# Patient Record
Sex: Female | Born: 1935 | Race: Black or African American | Hispanic: No | Smoking: Former smoker
Health system: Southern US, Community
[De-identification: ages and names within clinical notes are randomized; demographics above are authoritative.]

## PROBLEM LIST (undated history)

## (undated) DIAGNOSIS — E049 Nontoxic goiter, unspecified: Secondary | ICD-10-CM

## (undated) DIAGNOSIS — Z8719 Personal history of other diseases of the digestive system: Secondary | ICD-10-CM

## (undated) DIAGNOSIS — R262 Difficulty in walking, not elsewhere classified: Secondary | ICD-10-CM

## (undated) DIAGNOSIS — E559 Vitamin D deficiency, unspecified: Secondary | ICD-10-CM

## (undated) DIAGNOSIS — N182 Chronic kidney disease, stage 2 (mild): Secondary | ICD-10-CM

## (undated) DIAGNOSIS — R4182 Altered mental status, unspecified: Secondary | ICD-10-CM

## (undated) DIAGNOSIS — Z853 Personal history of malignant neoplasm of breast: Secondary | ICD-10-CM

## (undated) DIAGNOSIS — F39 Unspecified mood [affective] disorder: Secondary | ICD-10-CM

## (undated) DIAGNOSIS — F0390 Unspecified dementia without behavioral disturbance: Secondary | ICD-10-CM

## (undated) DIAGNOSIS — R441 Visual hallucinations: Secondary | ICD-10-CM

## (undated) DIAGNOSIS — E041 Nontoxic single thyroid nodule: Secondary | ICD-10-CM

## (undated) DIAGNOSIS — I10 Essential (primary) hypertension: Secondary | ICD-10-CM

## (undated) DIAGNOSIS — M1990 Unspecified osteoarthritis, unspecified site: Secondary | ICD-10-CM

## (undated) DIAGNOSIS — K2270 Barrett's esophagus without dysplasia: Secondary | ICD-10-CM

## (undated) DIAGNOSIS — K219 Gastro-esophageal reflux disease without esophagitis: Secondary | ICD-10-CM

## (undated) DIAGNOSIS — K5790 Diverticulosis of intestine, part unspecified, without perforation or abscess without bleeding: Secondary | ICD-10-CM

## (undated) DIAGNOSIS — I272 Pulmonary hypertension, unspecified: Secondary | ICD-10-CM

## (undated) DIAGNOSIS — E1122 Type 2 diabetes mellitus with diabetic chronic kidney disease: Secondary | ICD-10-CM

## (undated) DIAGNOSIS — R413 Other amnesia: Secondary | ICD-10-CM

## (undated) DIAGNOSIS — C50919 Malignant neoplasm of unspecified site of unspecified female breast: Secondary | ICD-10-CM

## (undated) DIAGNOSIS — E669 Obesity, unspecified: Secondary | ICD-10-CM

## (undated) DIAGNOSIS — IMO0001 Reserved for inherently not codable concepts without codable children: Secondary | ICD-10-CM

## (undated) DIAGNOSIS — H409 Unspecified glaucoma: Secondary | ICD-10-CM

## (undated) DIAGNOSIS — N6019 Diffuse cystic mastopathy of unspecified breast: Secondary | ICD-10-CM

## (undated) DIAGNOSIS — N312 Flaccid neuropathic bladder, not elsewhere classified: Secondary | ICD-10-CM

## (undated) DIAGNOSIS — D0511 Intraductal carcinoma in situ of right breast: Secondary | ICD-10-CM

## (undated) DIAGNOSIS — E7800 Pure hypercholesterolemia, unspecified: Secondary | ICD-10-CM

## (undated) DIAGNOSIS — R32 Unspecified urinary incontinence: Secondary | ICD-10-CM

## (undated) HISTORY — DX: Difficulty in walking, not elsewhere classified: R26.2

## (undated) HISTORY — PX: EYE SURGERY: SHX253

## (undated) HISTORY — DX: Diffuse cystic mastopathy of unspecified breast: N60.19

## (undated) HISTORY — DX: Unspecified glaucoma: H40.9

## (undated) HISTORY — DX: Pure hypercholesterolemia, unspecified: E78.00

## (undated) HISTORY — PX: ADENOIDECTOMY: SUR15

## (undated) HISTORY — DX: Malignant neoplasm of unspecified site of unspecified female breast: C50.919

## (undated) HISTORY — DX: Barrett's esophagus without dysplasia: K22.70

## (undated) HISTORY — DX: Unspecified osteoarthritis, unspecified site: M19.90

## (undated) HISTORY — DX: Intraductal carcinoma in situ of right breast: D05.11

## (undated) HISTORY — DX: Nontoxic goiter, unspecified: E04.9

## (undated) HISTORY — DX: Vitamin D deficiency, unspecified: E55.9

## (undated) HISTORY — DX: Type 2 diabetes mellitus with diabetic chronic kidney disease: E11.22

## (undated) HISTORY — DX: Unspecified dementia, unspecified severity, without behavioral disturbance, psychotic disturbance, mood disturbance, and anxiety: F03.90

## (undated) HISTORY — DX: Altered mental status, unspecified: R41.82

## (undated) HISTORY — DX: Obesity, unspecified: E66.9

## (undated) HISTORY — DX: Other amnesia: R41.3

## (undated) HISTORY — DX: Personal history of malignant neoplasm of breast: Z85.3

## (undated) HISTORY — PX: VAGINAL HYSTERECTOMY: SUR661

## (undated) HISTORY — DX: Visual hallucinations: R44.1

## (undated) HISTORY — DX: Diverticulosis of intestine, part unspecified, without perforation or abscess without bleeding: K57.90

## (undated) HISTORY — DX: Chronic kidney disease, stage 2 (mild): N18.2

## (undated) HISTORY — DX: Pulmonary hypertension, unspecified: I27.20

## (undated) HISTORY — PX: BREAST EXCISIONAL BIOPSY: SUR124

## (undated) HISTORY — DX: Unspecified urinary incontinence: R32

## (undated) HISTORY — DX: Unspecified mood (affective) disorder: F39

## (undated) HISTORY — PX: BREAST LUMPECTOMY: SHX2

---
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
## 1972-03-10 HISTORY — PX: BREAST BIOPSY: SHX20

## 1975-03-11 HISTORY — PX: BREAST BIOPSY: SHX20

## 1997-11-22 ENCOUNTER — Other Ambulatory Visit: Admission: RE | Admit: 1997-11-22 | Discharge: 1997-11-22 | Payer: Self-pay | Admitting: *Deleted

## 1998-03-10 DIAGNOSIS — IMO0001 Reserved for inherently not codable concepts without codable children: Secondary | ICD-10-CM

## 1998-03-10 HISTORY — DX: Reserved for inherently not codable concepts without codable children: IMO0001

## 1998-11-13 ENCOUNTER — Other Ambulatory Visit: Admission: RE | Admit: 1998-11-13 | Discharge: 1998-11-13 | Payer: Self-pay | Admitting: Obstetrics and Gynecology

## 1999-07-24 ENCOUNTER — Ambulatory Visit (HOSPITAL_COMMUNITY): Admission: RE | Admit: 1999-07-24 | Discharge: 1999-07-24 | Payer: Self-pay | Admitting: Gastroenterology

## 1999-09-19 ENCOUNTER — Encounter: Payer: Self-pay | Admitting: Cardiology

## 1999-09-19 ENCOUNTER — Encounter: Admission: RE | Admit: 1999-09-19 | Discharge: 1999-09-19 | Payer: Self-pay | Admitting: Cardiology

## 1999-11-18 ENCOUNTER — Other Ambulatory Visit: Admission: RE | Admit: 1999-11-18 | Discharge: 1999-11-18 | Payer: Self-pay | Admitting: Obstetrics and Gynecology

## 2000-04-03 ENCOUNTER — Encounter: Admission: RE | Admit: 2000-04-03 | Discharge: 2000-04-03 | Payer: Self-pay | Admitting: Cardiology

## 2000-04-03 ENCOUNTER — Encounter: Payer: Self-pay | Admitting: Cardiology

## 2000-11-19 ENCOUNTER — Other Ambulatory Visit: Admission: RE | Admit: 2000-11-19 | Discharge: 2000-11-19 | Payer: Self-pay | Admitting: Obstetrics and Gynecology

## 2002-02-16 ENCOUNTER — Other Ambulatory Visit: Admission: RE | Admit: 2002-02-16 | Discharge: 2002-02-16 | Payer: Self-pay | Admitting: Family Medicine

## 2003-03-20 ENCOUNTER — Other Ambulatory Visit: Admission: RE | Admit: 2003-03-20 | Discharge: 2003-03-20 | Payer: Self-pay | Admitting: Family Medicine

## 2003-12-05 ENCOUNTER — Ambulatory Visit (HOSPITAL_COMMUNITY): Admission: RE | Admit: 2003-12-05 | Discharge: 2003-12-05 | Payer: Self-pay | Admitting: Gastroenterology

## 2003-12-05 ENCOUNTER — Encounter (INDEPENDENT_AMBULATORY_CARE_PROVIDER_SITE_OTHER): Payer: Self-pay | Admitting: Specialist

## 2005-03-10 DIAGNOSIS — E041 Nontoxic single thyroid nodule: Secondary | ICD-10-CM

## 2005-03-10 HISTORY — DX: Nontoxic single thyroid nodule: E04.1

## 2005-04-10 ENCOUNTER — Encounter: Admission: RE | Admit: 2005-04-10 | Discharge: 2005-04-10 | Payer: Self-pay | Admitting: Family Medicine

## 2005-05-06 ENCOUNTER — Ambulatory Visit (HOSPITAL_COMMUNITY): Admission: RE | Admit: 2005-05-06 | Discharge: 2005-05-06 | Payer: Self-pay | Admitting: Family Medicine

## 2005-06-09 ENCOUNTER — Encounter (HOSPITAL_COMMUNITY): Admission: RE | Admit: 2005-06-09 | Discharge: 2005-09-07 | Payer: Self-pay | Admitting: Family Medicine

## 2005-06-17 ENCOUNTER — Encounter (INDEPENDENT_AMBULATORY_CARE_PROVIDER_SITE_OTHER): Payer: Self-pay | Admitting: Specialist

## 2005-06-17 ENCOUNTER — Encounter: Admission: RE | Admit: 2005-06-17 | Discharge: 2005-06-17 | Payer: Self-pay | Admitting: Family Medicine

## 2005-06-17 ENCOUNTER — Other Ambulatory Visit: Admission: RE | Admit: 2005-06-17 | Discharge: 2005-06-17 | Payer: Self-pay | Admitting: Diagnostic Radiology

## 2006-05-28 ENCOUNTER — Encounter: Admission: RE | Admit: 2006-05-28 | Discharge: 2006-05-28 | Payer: Self-pay | Admitting: Family Medicine

## 2006-06-10 ENCOUNTER — Encounter (INDEPENDENT_AMBULATORY_CARE_PROVIDER_SITE_OTHER): Payer: Self-pay | Admitting: *Deleted

## 2006-06-10 ENCOUNTER — Encounter: Admission: RE | Admit: 2006-06-10 | Discharge: 2006-06-10 | Payer: Self-pay | Admitting: Family Medicine

## 2006-06-10 ENCOUNTER — Other Ambulatory Visit: Admission: RE | Admit: 2006-06-10 | Discharge: 2006-06-10 | Payer: Self-pay | Admitting: Diagnostic Radiology

## 2007-03-11 HISTORY — PX: COLONOSCOPY: SHX174

## 2007-06-14 ENCOUNTER — Encounter: Admission: RE | Admit: 2007-06-14 | Discharge: 2007-06-14 | Payer: Self-pay | Admitting: Family Medicine

## 2010-03-31 ENCOUNTER — Encounter: Payer: Self-pay | Admitting: Family Medicine

## 2010-04-29 ENCOUNTER — Other Ambulatory Visit: Payer: Self-pay | Admitting: Family Medicine

## 2010-04-29 DIAGNOSIS — Z1231 Encounter for screening mammogram for malignant neoplasm of breast: Secondary | ICD-10-CM

## 2010-05-08 ENCOUNTER — Ambulatory Visit
Admission: RE | Admit: 2010-05-08 | Discharge: 2010-05-08 | Disposition: A | Discharge status: Home / Self Care | Payer: Medicare Other | Source: Ambulatory Visit | Attending: Family Medicine | Admitting: Family Medicine

## 2010-05-08 DIAGNOSIS — Z1231 Encounter for screening mammogram for malignant neoplasm of breast: Secondary | ICD-10-CM

## 2010-05-14 ENCOUNTER — Other Ambulatory Visit: Payer: Self-pay | Admitting: Family Medicine

## 2010-05-14 DIAGNOSIS — R928 Other abnormal and inconclusive findings on diagnostic imaging of breast: Secondary | ICD-10-CM

## 2010-05-20 ENCOUNTER — Ambulatory Visit
Admission: RE | Admit: 2010-05-20 | Discharge: 2010-05-20 | Disposition: A | Discharge status: Home / Self Care | Payer: Medicare (Managed Care) | Source: Ambulatory Visit | Attending: Family Medicine | Admitting: Family Medicine

## 2010-05-20 DIAGNOSIS — R928 Other abnormal and inconclusive findings on diagnostic imaging of breast: Secondary | ICD-10-CM

## 2010-07-26 NOTE — Op Note (Signed)
[facility]  Patient:    [name], [name]                          MRN: [id number] Proc. Date: 07/24/99 Adm. Date:  [id number] Disc. Date: [id number] Attending:  Rich Brave CC:         Lenoard Aden, M.D.                           Operative Report  PROCEDURE:  Colonoscopy with biopsy.  INDICATION:  A 77 year old for follow up of adenomatous polyps, removed about 2-1/2 years ago.  FINDINGS:  Multiple small sessile polys removed from the proximal ascending colon and also in the rectosigmoid region.  Mild sigmoid diverticulosis.  DESCRIPTION OF PROCEDURE:  The nature, purpose and risks of the procedure were were familiar to the patient from previous examinations, and she provided written consent.  Sedation for this procedure, and the upper endoscopy which preceded it, totaled fentanyl 100 mcg and Versed 6 mg IV; without arrhythmias or desaturation.  The Olympus adult video colonoscope was advanced easily to the level just above the cecum, but then, to reach the base of the cecum, took about 15 min including having the patient in the prone position.  Ultimately, the tip of the scope reached the base of the cecum, as confirmed by clear visualization of the appendiceal orifice; pullback was then performed, although the pullback in the proximal ascending colon was uncontrolled in this an optimal look was not obtained of the entire cecal and proximal colonic area.  However, between the inspection during prolonged efforts at antegrade advancement plus the glimpse during pullout, it is felt that no significant lesions would have been missed.  The quality of the prep was excellent, so it is felt that otherwise all areas were well seen.  Just above the cecum were two or three small sessile polyps, the largest perhaps 4 mm across, and I elected to remove these by several cold biopsies.  In the rectosigmoid region, I encountered approximately 6-10  small sessile hyperplastic-appearing polyps, some of which I repeatedly biopsied to the point of extirpation, other of which I just sampled, and a couple of small flat ones I did not even bother to biopsy -- in view of the multiple numbers of these polyps and the presumed hyperplastic character, based on the appearance, location and number.  Retroflexion was not performed in the rectum.  There was a mild to moderate amount of sigmoid diverticulosis, with some slight changes of associated muscular thickening mycosis).  There was no evidence of colitis, vascular malformations, cancer or large polyps.  The patient tolerated this procedure well and there were no apparent complications.  IMPRESSION: 1. Multiple small colonic polyps, biopsied as described above. 2. Mild sigmoid diverticulosis.  PLAN:  Await pathology on the polyps. DD:  07/24/99 TD:  07/28/99 Job: [id number]

## 2010-07-26 NOTE — Op Note (Signed)
NAME:  [name], [name]                   ACCOUNT NO.:  [id number]   MEDICAL RECORD NO.:  [id number]          PATIENT TYPE:  AMB   LOCATION:  ENDO                         FACILITY:  [facility]   PHYSICIAN:  [name], M.D.   DATE OF BIRTH:  Oct 09, 1931   DATE OF PROCEDURE:  12/05/2003  DATE OF DISCHARGE:                                 OPERATIVE REPORT   PROCEDURE:  Upper endoscopy with biopsies.   SURGEON:   INDICATIONS FOR PROCEDURE:  A 77 year old female with prior history of short  segment Barrett's esophagus, positive for intestinal metaplasia but negative  for dysphagia when biopsied about two years ago.   FINDINGS:  A large hiatal hernia as previously noted with short segment  Barrett's esophagus.   DESCRIPTION OF PROCEDURE:  The nature, purpose and risks of the procedure  were familiar to the patient from prior examination.  She provided written  consent.  Sedation was Fentanyl 50 mcg and Versed 7 mg IV without  arrhythmias or desaturation.  The Olympus video endoscope was passed under  direct vision.  The vocal cords were not well seen.  The esophagus was quite  readily entered.  The squamocolumnar junction was at about 30 cm.  There was  roughly 1 cm tongue of Barrett's mucosa in the distal esophagus and this was  biopsied several times but the remainder of the esophagus was unremarkable,  without evidence of free reflux, reflux esophagitis, varices, infection or  neoplasia.  No ring or stricture was appreciated.  The patient had a roughly  10 cm hiatal hernia with diaphragmatic hiatus at about 40 cm.  The  diaphragmatic hiatus did not appear particularly patient, however.   The stomach was entered and noted to contain a fair amount of retained food.  The gastric mucosa were visualized, unremarkable in appearance and no polyps  or masses were observed.  Retroflexed viewing, as noted, did not show any  significant abnormalities other than a slightly patulous diaphragmatic  hiatus and the pylorus, duodenal bulb and second duodenum looked normal.   The scope was removed from the patient who tolerated the procedure well  without complications.   IMPRESSION:  1.  Short segment Barrett's esophagus (530.85).  2.  Retained food in the stomach, raising question of some possible delayed      gastric emptying.   PLAN:  1.  Await pathology of biopsies.  2.  Consider follow-up endoscopy in two to three years for continued      surveillance of Barrett's.  3.  Depending on severity of reflux symptoms, consideration might be given      to prokinetic therapy with either metoclopramide or tegaserod, although      I tend to doubt that that will be needed.       RB/MEDQ  D:  12/05/2003  T:  12/05/2003  Job:  [id number]   cc:   Gretta Arab. [name], M.D.  [address] [place]  [place] [address]  Fax: [phone]

## 2010-07-26 NOTE — Procedures (Signed)
[facility]  Patient:    [name], [name]                          MRN: [id number] Proc. Date: 07/24/99 Adm. Date:  [id number] Disc. Date: [id number] Attending:  Rich Brave CC:         Lenoard Aden, M.D.                           Procedure Report  PROCEDURE PERFORMED:  Upper endoscopy.  ENDOSCOPIST:  Florencia Reasons, M.D.  INDICATIONS FOR PROCEDURE:  Longstanding reflux in a 77 year old female.  FINDINGS:  Basically unchanged from three years ago when last examined, with fairly large hiatal hernia but no evidence of esophagitis or Barretts.  DESCRIPTION OF PROCEDURE:  The nature, purpose and risks of the procedure were familiar to the patient from prior examination and she provided written consent.  Sedation was fentanyl 75 mcg and Versed 4 mg IV without arrhythmias or desaturation.  The Olympus video endoscope was passed under direct vision. The vocal cords looked normal but there was a little bit of edema of the posterior commissure of the larynx suggesting possibly an element of laryngeal pharyngeal reflux.  The esophagus was entered without difficulty and had normal mucosa throughout its entire length, specifically without evidence of Barretts esophagus, reflux esophagitis, varices, infection or neoplasia. There was bilious reflux from the stomach and hiatal hernia up into the esophagus as the patient retched during the initial part of the procedure.  At the squamocolumnar junction there appeared to be a widely patent esophageal ring and below this was a fairly large hiatal hernia, probably about 5 cm in length.  The stomach was entered.  It contained no significant residual. There was a little bit of watermelon-type erythema in the antrum of the stomach but no focal lesion such as erosions, ulcers, polyps or masses. Retroflex viewing of the proximal stomach showed that the diaphragmatic hiatus was somewhat patulous but it was  otherwise a normal exam.  The pylorus, duodenal bulb and second duodenum were unremarkable in appearance apart from some slightly hypertrophied Brunners glands.  The scope was then removed from the patient.  No biopsies were obtained.   The patient tolerated the procedure well and there were no apparent complications.   IMPRESSION:  No evidence of any significant adverse sequelae of reflux. Possible mild laryngeal changes attributable to reflux.  Moderately large hiatal hernia.  PLAN:  Continue medical therapy with Prilosec 10 mg daily which is the dose she has been on for some time with good results. DD:  07/24/99 TD:  07/26/99 Job: [id number]

## 2010-12-12 ENCOUNTER — Other Ambulatory Visit: Payer: Self-pay | Admitting: Family Medicine

## 2010-12-12 DIAGNOSIS — R921 Mammographic calcification found on diagnostic imaging of breast: Secondary | ICD-10-CM

## 2010-12-25 ENCOUNTER — Ambulatory Visit
Admission: RE | Admit: 2010-12-25 | Discharge: 2010-12-25 | Disposition: A | Discharge status: Home / Self Care | Payer: Medicare Other | Source: Ambulatory Visit | Attending: Family Medicine | Admitting: Family Medicine

## 2010-12-25 ENCOUNTER — Other Ambulatory Visit: Payer: Self-pay | Admitting: Family Medicine

## 2010-12-25 DIAGNOSIS — R921 Mammographic calcification found on diagnostic imaging of breast: Secondary | ICD-10-CM

## 2011-01-01 ENCOUNTER — Ambulatory Visit
Admission: RE | Admit: 2011-01-01 | Discharge: 2011-01-01 | Disposition: A | Discharge status: Home / Self Care | Payer: Medicare Other | Source: Ambulatory Visit | Attending: Family Medicine | Admitting: Family Medicine

## 2011-01-01 ENCOUNTER — Other Ambulatory Visit: Payer: Self-pay | Admitting: Family Medicine

## 2011-01-01 DIAGNOSIS — R921 Mammographic calcification found on diagnostic imaging of breast: Secondary | ICD-10-CM

## 2011-01-01 DIAGNOSIS — C50919 Malignant neoplasm of unspecified site of unspecified female breast: Secondary | ICD-10-CM

## 2011-01-01 HISTORY — DX: Malignant neoplasm of unspecified site of unspecified female breast: C50.919

## 2011-01-02 ENCOUNTER — Ambulatory Visit
Admission: RE | Admit: 2011-01-02 | Discharge: 2011-01-02 | Disposition: A | Discharge status: Home / Self Care | Payer: Medicare Other | Source: Ambulatory Visit | Attending: Family Medicine | Admitting: Family Medicine

## 2011-01-02 ENCOUNTER — Other Ambulatory Visit: Payer: Self-pay | Admitting: Family Medicine

## 2011-01-02 DIAGNOSIS — C50912 Malignant neoplasm of unspecified site of left female breast: Secondary | ICD-10-CM

## 2011-01-02 DIAGNOSIS — R921 Mammographic calcification found on diagnostic imaging of breast: Secondary | ICD-10-CM

## 2011-01-07 ENCOUNTER — Encounter (INDEPENDENT_AMBULATORY_CARE_PROVIDER_SITE_OTHER): Payer: Self-pay | Admitting: Surgery

## 2011-01-07 ENCOUNTER — Ambulatory Visit
Admission: RE | Admit: 2011-01-07 | Discharge: 2011-01-07 | Disposition: A | Discharge status: Home / Self Care | Payer: Medicare Other | Source: Ambulatory Visit | Attending: Family Medicine | Admitting: Family Medicine

## 2011-01-07 ENCOUNTER — Encounter: Payer: Self-pay | Admitting: *Deleted

## 2011-01-07 DIAGNOSIS — C50912 Malignant neoplasm of unspecified site of left female breast: Secondary | ICD-10-CM

## 2011-01-07 MED ORDER — GADOBENATE DIMEGLUMINE 529 MG/ML IV SOLN
20.0000 mL | Freq: Once | INTRAVENOUS | Status: AC | PRN
  Administered 2011-01-07: 20 mL via INTRAVENOUS

## 2011-01-08 ENCOUNTER — Encounter (INDEPENDENT_AMBULATORY_CARE_PROVIDER_SITE_OTHER): Payer: Self-pay | Admitting: Surgery

## 2011-01-08 ENCOUNTER — Ambulatory Visit (HOSPITAL_BASED_OUTPATIENT_CLINIC_OR_DEPARTMENT_OTHER): Payer: Medicare Other | Admitting: Surgery

## 2011-01-08 ENCOUNTER — Other Ambulatory Visit: Payer: Self-pay | Admitting: Oncology

## 2011-01-08 ENCOUNTER — Encounter (HOSPITAL_BASED_OUTPATIENT_CLINIC_OR_DEPARTMENT_OTHER): Payer: PRIVATE HEALTH INSURANCE | Admitting: Oncology

## 2011-01-08 ENCOUNTER — Ambulatory Visit: Payer: Medicare Other | Admitting: Physical Therapy

## 2011-01-08 VITALS — BP 126/74 | HR 83 | Temp 97.7°F | Resp 20 | Ht 63.0 in | Wt 236.7 lb

## 2011-01-08 DIAGNOSIS — D0590 Unspecified type of carcinoma in situ of unspecified breast: Secondary | ICD-10-CM

## 2011-01-08 DIAGNOSIS — C50919 Malignant neoplasm of unspecified site of unspecified female breast: Secondary | ICD-10-CM

## 2011-01-08 LAB — CBC WITH DIFFERENTIAL/PLATELET
Basophils Absolute: 0 10*3/uL (ref 0.0–0.1)
Eosinophils Absolute: 0.2 10*3/uL (ref 0.0–0.5)
HGB: 13.3 g/dL (ref 11.6–15.9)
LYMPH%: 22.8 % (ref 14.0–49.7)
MCV: 90.2 fL (ref 79.5–101.0)
MONO#: 0.4 10*3/uL (ref 0.1–0.9)
MONO%: 8 % (ref 0.0–14.0)
NEUT#: 3.2 10*3/uL (ref 1.5–6.5)
Platelets: 227 10*3/uL (ref 145–400)
RBC: 4.5 10*6/uL (ref 3.70–5.45)
WBC: 4.9 10*3/uL (ref 3.9–10.3)

## 2011-01-08 LAB — COMPREHENSIVE METABOLIC PANEL
Albumin: 3.7 g/dL (ref 3.5–5.2)
BUN: 18 mg/dL (ref 6–23)
CO2: 26 mEq/L (ref 19–32)
Glucose, Bld: 167 mg/dL — ABNORMAL HIGH (ref 70–99)
Potassium: 3.7 mEq/L (ref 3.5–5.3)
Sodium: 140 mEq/L (ref 135–145)
Total Protein: 7 g/dL (ref 6.0–8.3)

## 2011-01-08 NOTE — Patient Instructions (Signed)
We will schedule you to have your left breast cancer removed. The day of surgery you'll need to go to the breast center for them to place a guidewire to localize the clip which they used to mark the breast cancer. He should be able to go home after the surgery.  If you have any questions about the surgery please call my office. My nurse is Jade.

## 2011-01-08 NOTE — Progress Notes (Signed)
NAME: [name]                                                                                      DOB: 06-12-1931 DATE: 01/08/2011               MRN: [id number]   CC:  Chief Complaint  Patient presents with  . Breast Cancer    HPI:  [name] is a 77 y.o.  female who presents with newly diagnosed left breast cancer. 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 She comes to the clinic for discussion on treatment alternatives. She has never had a significant breast problems although she did have a benign breast cyst removed many years ago. She has no family history of breast cancer.  PMH:   has a past medical history of Breast cancer, Left, DCIS (01/01/2011).  PSH:   has past surgical history that includes Tonsillectomy (1944); Breast biopsy (1974); Breast biopsy (1977); and Abdominal hysterectomy.  ALLERGIES:   Allergies  Allergen Reactions  . Tape Rash  . Penicillins Hives    MEDICATIONS:   Current Outpatient Prescriptions  Medication Sig Dispense Refill  . Multiple Vitamins-Minerals (MULTIVITAMIN WITH MINERALS) tablet Take by mouth daily.        [name] omeprazole (PRILOSEC) 10 MG capsule Take 10 mg by mouth daily.          ROS: Her 12 point review of systems form is negative except for the fact she gets a little short of breath walking up stairs and sleeps on more than one pillow and has had some heartburn arthritis and difficulty walking up steps. She's also been diagnosed with a benign thyroid nodule.  EXAM:   GENERAL:  The patient is alert, oriented, and generally healthy-appearing, NAD. Mood and affect are normal.  HEENT:  The head is normocephalic, the eyes nonicteric, the pupils were round regular and equal. EOMs are normal. Pharynx normal. Dentition good.  NECK:  The neck is supple and there are no masses or thyromegaly.  LUNGS: Normal respirations and clear to  auscultation.  HEART: Regular rhythm, with no murmurs rubs or gallops. Pulses are intact carotid dorsalis pedis and posterior tibial. No significant varicosities are noted.  BREASTS:  The breasts are symmetric in appearance. They're fairly large. There is no dominant mass.  LYMPHATICS: There are no abnormal lymph nodes in the axilla or supraclavicular areas.  ABDOMEN: Soft, flat, and nontender. No masses or organomegaly is noted. No hernias are noted. Bowel sounds are normal.  EXTREMITIES:  Good range of motion, no edema.   DATA REVIEWED:   I seen her mammogram and MRI films and reports her pathology slides and reports.  IMPRESSION:  Clinical stage II left breast cancer posterior lateral central left breast  PLAN:   I think a needle localized lumpectomy as the best option although she could have a mastectomy. She is very discussed the situation with the medical and radiation oncologists and would prefer to proceed with lumpectomy.I have discussed the indications for the lumpectomy and described the procedure. She understand that the chance  of removal of the abnormal area is very good, but that occasionally we are unable to locate it and may have to do a second procedure. We also discussed the possibility of a second procedure to get additional tissue. Risks of surgery such as bleeding and infection have also been explained, as well as the implications of not doing the surgery. She understands and wishes to proceed.

## 2011-01-09 ENCOUNTER — Other Ambulatory Visit (INDEPENDENT_AMBULATORY_CARE_PROVIDER_SITE_OTHER): Payer: Self-pay | Admitting: Surgery

## 2011-01-09 DIAGNOSIS — C50919 Malignant neoplasm of unspecified site of unspecified female breast: Secondary | ICD-10-CM

## 2011-01-10 ENCOUNTER — Telehealth (INDEPENDENT_AMBULATORY_CARE_PROVIDER_SITE_OTHER): Payer: Self-pay | Admitting: General Surgery

## 2011-01-10 NOTE — Telephone Encounter (Signed)
Patient called to make sure she could continue taking her calcium supplements and that they would not affect her breast cancer. Made patient aware that there is no link to calcium supplements and breast cancer. She will call with any other questions.

## 2011-01-13 ENCOUNTER — Telehealth: Payer: Self-pay | Admitting: Oncology

## 2011-01-13 NOTE — Telephone Encounter (Signed)
Per pof 11/02 scheduled pt for appt in dec2012.  Called pt and informed her of appt d/t

## 2011-01-13 NOTE — Progress Notes (Signed)
CC:   [name]. [name], M.D. [name], M.D. [name], Ph.D., M.D. [name], M.D.  [name] is a 77 year old [place] woman referred by Dr. Jamey Ripa for evaluation and treatment in the setting of newly diagnosed breast cancer.  The patient had screening mammography in February 2012 which showed some calcifications in the left breast.  She was brought back for additional views in March, and this showed a few minimally heterogeneous and punctate calcifications in the upper outer portion of the left breast spanning up to 2 cm.  There was no associated mass or distortion.  After appropriate discussion, it was felt that repeat mammography in 6 months would be appropriate, and the patient returned October 17th for diagnostic left mammography.  At this time, the calcifications appeared to be increasing and were more linear.  Again, there was no evidence of an associated mass or distortion.  A second smaller cluster of punctate calcifications in the upper outer left breast was unchanged.  The patient does have heterogeneously dense breast tissue which, of course, decreases mammographic sensitivity.  Given this information, the patient returned a week later, on October 24th, for a biopsy of the left breast calcification area, and this showed [phone] a low-grade ductal carcinoma in situ which was estrogen receptor 99% and progesterone receptor 100% positive. With this information, the patient was referred to Dr. Jamey Ripa, and after undergoing right diagnostic mammography October 25th, which was unremarkable, had bilateral breast MRIs on October 30th.  This showed an area of clumped and linear enhancement along the biopsy tract measuring 1.2 cm.  There was no suspicious mass or abnormal enhancement and no internal mammary or axillary adenopathy.  Bilateral thyroid masses were incidentally imaged.  With this information, the patient now presents to the  multidisciplinary breast cancer clinic for further discussion and treatment plan.  PAST MEDICAL HISTORY:  Significant for EGRD, osteoarthritis especially involving the knees, obesity, status post tonsillectomy and adenoidectomy, status post simple hysterectomy with no salpingo- oophorectomy in 1986, status post benign left breast biopsies in 1974 and 1977.  FAMILY HISTORY:  The patient's father died at the age of 68.  The patient's mother died at the age of 62 following a stroke.  The patient was an only child.  The patient's mother's mother was diagnosed with breast cancer at the age of 55.  The only other cancer that she is aware of in the family is colon cancer in a paternal uncle.  There is no history of ovarian cancer in the family.  GYNECOLOGIC HISTORY:  She had menarche at age 65.  Last period at the time of her hysterectomy in 1986.  She used hormone replacement for approximately 4 years, stopping in 1990.  She is GX P1, used birth control pills for approximately 1 year remotely.  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 Her daughter, [name], teaches math here in [place], although she is currently semiretired because of disability.  The patient lives with [name] and [name]'s 3 grandchildren, ages 3 and 75 year old twins.  HEALTH MAINTENANCE:  The patient never smoked.  Does not abuse alcohol. Had her most recent colonoscopy under [name] in 2009.  Had a bone density at [facility] in 2010 which was "okay."  Had her last Pap smear in the year 2000.  Does not know her cholesterol level.  ALLERGIES:  Penicillin causes itching, and she gets  a rash with paper tape.  MEDICATIONS:  Include __________ glaucoma eyedrops, Prilosec, multivitamin, calcium, minerals and omeprazole.  REVIEW OF SYSTEMS:  She gets short of breath walking up stairs and sleeps on 2 pillows, has a  history of heartburn which has not increased recently and some difficulty walking, particularly up steps because of knee pain.  She has a benign goiter which has been previously evaluated. Otherwise, a separately scanned detailed review of systems was noncontributory.  PHYSICAL EXAMINATION:  General:  [name] is an elderly African American woman who is 5 feet 3 inches tall and weighs 234 pounds.  Vital Signs: Temperature is 97.7, pulse 83, respiratory rate 20, blood pressure 126/74.  HEENT:  Sclerae are not icteric.  Oropharynx clear.  Neck: Supple.  Lungs:  No crackles or wheezes.  Heart:  Regular rate and rhythm.  Right Breast:  Unremarkable.  Left Breast:  Shows no obvious mass, nipple retraction, or skin change.  Abdomen:  Obese, benign. Musculoskeletal Exam:  No focal spinal tenderness.  No peripheral edema. Neurologic Exam:  Nonfocal.  LABORATORY DATA:  Show a white cell count of 4.9, hemoglobin 13.3, platelets 227,000, glucose nonfasting 167, creatinine 0.9.  Normal liver function tests.  Normal albumin and calcium.  FILMS:  As already noted.  In addition, the patient had ultrasound of the thyroid in April of 2009 showing no change as compared to a year prior.  Biopsy of the thyroid in April 2008 showed follicular epithelium in a microfollicular pattern which is consistent with goiter, although adenomatous nodules could look similar  ([id number]).  Nuclear medicine thyroid scan obtained April 2007 showed a large area of decreased uptake in the lower pole of the left thyroid, which is the area biopsied as described above.  CT of the chest obtained in 2007 showed a moderately large hiatal hernia, fatty liver but was otherwise unremarkable.  IMPRESSION AND PLAN:  77 year old [place] woman, status post left breast biopsy October 2010 for a low-grade ductal carcinoma in situ. Strongly estrogen and progesterone receptor positive.  The patient understands the need for surgery,  and this will consist of a simple lumpectomy with no sentinel lymph node biopsy.  She understands that for low-grade ductal carcinoma in situ cases which are estrogen receptor positive, patients older than 70 may have the option of foregoing radiation assuming that they do take antiestrogen therapy for 5 years.  Alternatively, they may choose to have radiation and forego the antiestrogen therapy, or they may choose to do both to minimize the risk of both local and contralateral recurrence.  Finally, we do have B43, which is a study asking whether adding trastuzumab, 2 doses, in cases of ductal carcinoma in situ with HER2 amplified would lower the recurrence rate.  [name] is very interested, and we have asked our study nurses to contact her.  She will return to see me December 6th.  By that time, we should have her final pathology at hand, and we should be able to make a definitive decision regarding radiation and antiestrogen therapy.  We spent the better part of the 1- hour visit today discussing these issues in detail.  I believe all [name]'s questions today were answered to her satisfaction.    ______________________________ Lowella Dell, M.D. GCM/MEDQ  D:  01/10/2011  T:  01/13/2011  Job:  [id number]

## 2011-01-14 ENCOUNTER — Encounter (HOSPITAL_COMMUNITY): Payer: Self-pay | Admitting: Pharmacy Technician

## 2011-01-15 ENCOUNTER — Encounter (HOSPITAL_COMMUNITY): Payer: Self-pay

## 2011-01-15 ENCOUNTER — Encounter (HOSPITAL_COMMUNITY): Payer: Self-pay | Admitting: Pharmacy Technician

## 2011-01-15 ENCOUNTER — Encounter (HOSPITAL_COMMUNITY)
Admission: RE | Admit: 2011-01-15 | Discharge: 2011-01-15 | Disposition: A | Discharge status: Home / Self Care | Payer: Medicare Other | Source: Ambulatory Visit | Attending: Surgery | Admitting: Surgery

## 2011-01-15 ENCOUNTER — Ambulatory Visit (HOSPITAL_COMMUNITY)
Admission: RE | Admit: 2011-01-15 | Discharge: 2011-01-15 | Disposition: A | Discharge status: Home / Self Care | Payer: Medicare Other | Source: Ambulatory Visit | Attending: Anesthesiology | Admitting: Anesthesiology

## 2011-01-15 DIAGNOSIS — Z01818 Encounter for other preprocedural examination: Secondary | ICD-10-CM | POA: Insufficient documentation

## 2011-01-15 DIAGNOSIS — Z01812 Encounter for preprocedural laboratory examination: Secondary | ICD-10-CM | POA: Insufficient documentation

## 2011-01-15 HISTORY — DX: Gastro-esophageal reflux disease without esophagitis: K21.9

## 2011-01-15 HISTORY — DX: Nontoxic single thyroid nodule: E04.1

## 2011-01-15 HISTORY — DX: Reserved for inherently not codable concepts without codable children: IMO0001

## 2011-01-15 HISTORY — DX: Essential (primary) hypertension: I10

## 2011-01-15 HISTORY — DX: Unspecified osteoarthritis, unspecified site: M19.90

## 2011-01-15 LAB — CBC
MCHC: 32.4 g/dL (ref 30.0–36.0)
Platelets: 206 10*3/uL (ref 150–400)
RDW: 14 % (ref 11.5–15.5)
WBC: 6.8 10*3/uL (ref 4.0–10.5)

## 2011-01-15 LAB — SURGICAL PCR SCREEN
MRSA, PCR: NEGATIVE
Staphylococcus aureus: POSITIVE — AB

## 2011-01-15 NOTE — Pre-Procedure Instructions (Addendum)
22 [name]  01/15/2011   Your procedure is scheduled on:  01/22/11  Report to [facility] at 1230 pm  Call this number if you have problems the morning of surgery: [phone]   Remember:   Do not eat food:After Midnight.  Do not drink clear liquids: 4 Hours before arrival.  Take these medicines the morning of surgery with A SIP OF WATER: prilosec,   Do not wear jewelry, make-up or nail polish.  Do not wear lotions, powders, or perfumes. You may wear deodorant.  Do not shave 48 hours prior to surgery.  Do not bring valuables to the hospital.  Contacts, dentures or bridgework may not be worn into surgery.  Leave suitcase in the car. After surgery it may be brought to your room.  For patients admitted to the hospital, checkout time is 11:00 AM the day of discharge.   Patients discharged the day of surgery will not be allowed to drive home.   Name and phone number of your driver: [name] [phone] or [phone]  Special Instructions: CHG Shower Use Special Wash: 1/2 bottle night before surgery and 1/2 bottle morning of surgery.   Please read over the following fact sheets that you were given: MRSA Information and Surgical Site Infection Prevention

## 2011-01-20 ENCOUNTER — Encounter: Payer: Self-pay | Admitting: *Deleted

## 2011-01-20 NOTE — Progress Notes (Signed)
Mailed after appt letter to pt. 

## 2011-01-21 MED ORDER — CIPROFLOXACIN IN D5W 400 MG/200ML IV SOLN
400.0000 mg | INTRAVENOUS | Status: AC
  Administered 2011-01-22: 400 mg via INTRAVENOUS
  Filled 2011-01-21: qty 200

## 2011-01-22 ENCOUNTER — Encounter (HOSPITAL_COMMUNITY): Payer: Self-pay | Admitting: Anesthesiology

## 2011-01-22 ENCOUNTER — Other Ambulatory Visit: Payer: Self-pay

## 2011-01-22 ENCOUNTER — Encounter (HOSPITAL_COMMUNITY)
Admission: RE | Disposition: A | Discharge status: Home / Self Care | Payer: Self-pay | Source: Ambulatory Visit | Attending: Surgery

## 2011-01-22 ENCOUNTER — Ambulatory Visit
Admission: RE | Admit: 2011-01-22 | Discharge: 2011-01-22 | Disposition: A | Discharge status: Home / Self Care | Payer: Medicare Other | Source: Ambulatory Visit | Attending: Surgery | Admitting: Surgery

## 2011-01-22 ENCOUNTER — Ambulatory Visit (HOSPITAL_COMMUNITY)
Admission: RE | Admit: 2011-01-22 | Discharge: 2011-01-22 | Disposition: A | Discharge status: Home / Self Care | Payer: Medicare Other | Source: Ambulatory Visit | Attending: Surgery | Admitting: Surgery

## 2011-01-22 ENCOUNTER — Ambulatory Visit (HOSPITAL_COMMUNITY): Payer: Medicare Other | Admitting: Anesthesiology

## 2011-01-22 ENCOUNTER — Encounter (HOSPITAL_COMMUNITY): Payer: Self-pay | Admitting: Surgery

## 2011-01-22 ENCOUNTER — Other Ambulatory Visit (INDEPENDENT_AMBULATORY_CARE_PROVIDER_SITE_OTHER): Payer: Self-pay | Admitting: Surgery

## 2011-01-22 DIAGNOSIS — D0590 Unspecified type of carcinoma in situ of unspecified breast: Secondary | ICD-10-CM

## 2011-01-22 DIAGNOSIS — I10 Essential (primary) hypertension: Secondary | ICD-10-CM | POA: Insufficient documentation

## 2011-01-22 DIAGNOSIS — K219 Gastro-esophageal reflux disease without esophagitis: Secondary | ICD-10-CM | POA: Insufficient documentation

## 2011-01-22 DIAGNOSIS — C50919 Malignant neoplasm of unspecified site of unspecified female breast: Secondary | ICD-10-CM

## 2011-01-22 HISTORY — PX: BREAST LUMPECTOMY W/ NEEDLE LOCALIZATION: SHX1266

## 2011-01-22 HISTORY — PX: BREAST LUMPECTOMY: SHX2

## 2011-01-22 LAB — BASIC METABOLIC PANEL
CO2: 26 mEq/L (ref 19–32)
Calcium: 10.4 mg/dL (ref 8.4–10.5)
Creatinine, Ser: 0.92 mg/dL (ref 0.50–1.10)
GFR calc Af Amer: 68 mL/min — ABNORMAL LOW (ref >90)
GFR calc non Af Amer: 59 mL/min — ABNORMAL LOW (ref >90)

## 2011-01-22 SURGERY — BREAST LUMPECTOMY
Anesthesia: General | Laterality: Left

## 2011-01-22 MED ORDER — FENTANYL CITRATE 0.05 MG/ML IJ SOLN
INTRAMUSCULAR | Status: DC | PRN
  Administered 2011-01-22: 25 ug via INTRAVENOUS
  Administered 2011-01-22: 100 ug via INTRAVENOUS

## 2011-01-22 MED ORDER — EPHEDRINE SULFATE 50 MG/ML IJ SOLN
INTRAMUSCULAR | Status: DC | PRN
  Administered 2011-01-22 (×3): 5 mg via INTRAVENOUS

## 2011-01-22 MED ORDER — SUCCINYLCHOLINE CHLORIDE 20 MG/ML IJ SOLN
INTRAMUSCULAR | Status: DC | PRN
  Administered 2011-01-22: 100 mg via INTRAVENOUS

## 2011-01-22 MED ORDER — ONDANSETRON HCL 4 MG/2ML IJ SOLN
INTRAMUSCULAR | Status: DC | PRN
  Administered 2011-01-22: 4 mg via INTRAVENOUS

## 2011-01-22 MED ORDER — PROMETHAZINE HCL 25 MG/ML IJ SOLN: 6.2500 mg | INTRAMUSCULAR | Status: DC | PRN

## 2011-01-22 MED ORDER — MIDAZOLAM HCL 5 MG/5ML IJ SOLN
INTRAMUSCULAR | Status: DC | PRN
  Administered 2011-01-22: 1 mg via INTRAVENOUS

## 2011-01-22 MED ORDER — HYDROCODONE-ACETAMINOPHEN 5-325 MG PO TABS: 1.0000 | ORAL_TABLET | Freq: Four times a day (QID) | ORAL | Status: AC | PRN

## 2011-01-22 MED ORDER — HYDROMORPHONE HCL PF 1 MG/ML IJ SOLN
0.2500 mg | INTRAMUSCULAR | Status: DC | PRN
  Administered 2011-01-22 (×2): 0.25 mg via INTRAVENOUS

## 2011-01-22 MED ORDER — LACTATED RINGERS IV SOLN
INTRAVENOUS | Status: DC | PRN
  Administered 2011-01-22: 15:00:00 via INTRAVENOUS

## 2011-01-22 MED ORDER — PROPOFOL 10 MG/ML IV EMUL
INTRAVENOUS | Status: DC | PRN
  Administered 2011-01-22 (×2): 200 mg via INTRAVENOUS

## 2011-01-22 MED ORDER — MORPHINE SULFATE 2 MG/ML IJ SOLN: 0.0500 mg/kg | INTRAMUSCULAR | Status: DC | PRN

## 2011-01-22 MED ORDER — BUPIVACAINE HCL 0.25 % IJ SOLN
INTRAMUSCULAR | Status: DC | PRN
  Administered 2011-01-22: 30 mL

## 2011-01-22 MED ORDER — LACTATED RINGERS IV SOLN: 1000.0000 mL | INTRAVENOUS | Status: DC

## 2011-01-22 MED ORDER — SODIUM CHLORIDE 0.9 % IR SOLN
Status: DC | PRN
  Administered 2011-01-22: 1000 mL

## 2011-01-22 SURGICAL SUPPLY — 40 items
APPLIER CLIP 9.375 MED OPEN (MISCELLANEOUS) ×4
APPLIER CLIP 9.375 SM OPEN (CLIP) ×2
BINDER BREAST XXLRG (GAUZE/BANDAGES/DRESSINGS) ×2 IMPLANT
CANISTER SUCTION 2500CC (MISCELLANEOUS) IMPLANT
CHLORAPREP W/TINT 26ML (MISCELLANEOUS) ×2 IMPLANT
CLIP APPLIE 9.375 MED OPEN (MISCELLANEOUS) ×2 IMPLANT
CLIP APPLIE 9.375 SM OPEN (CLIP) ×1 IMPLANT
CLOTH BEACON ORANGE TIMEOUT ST (SAFETY) ×2 IMPLANT
CONT SPEC 4OZ CLIKSEAL STRL BL (MISCELLANEOUS) ×6 IMPLANT
COVER PROBE W GEL 5X96 (DRAPES) IMPLANT
COVER SURGICAL LIGHT HANDLE (MISCELLANEOUS) ×2 IMPLANT
DECANTER SPIKE VIAL GLASS SM (MISCELLANEOUS) ×2 IMPLANT
DERMABOND ADVANCED (GAUZE/BANDAGES/DRESSINGS) ×1
DERMABOND ADVANCED .7 DNX12 (GAUZE/BANDAGES/DRESSINGS) ×1 IMPLANT
DEVICE DUBIN SPECIMEN MAMMOGRA (MISCELLANEOUS) IMPLANT
DRAPE CHEST BREAST 15X10 FENES (DRAPES) ×2 IMPLANT
DRSG TEGADERM 4X4.75 (GAUZE/BANDAGES/DRESSINGS) ×2 IMPLANT
ELECT CAUTERY BLADE 6.4 (BLADE) ×2 IMPLANT
ELECT REM PT RETURN 9FT ADLT (ELECTROSURGICAL) ×2
ELECTRODE REM PT RTRN 9FT ADLT (ELECTROSURGICAL) ×1 IMPLANT
GLOVE ECLIPSE 7.5 STRL STRAW (GLOVE) ×2 IMPLANT
GLOVE EUDERMIC 7 POWDERFREE (GLOVE) ×2 IMPLANT
GOWN PREVENTION PLUS XLARGE (GOWN DISPOSABLE) ×2 IMPLANT
GOWN STRL NON-REIN LRG LVL3 (GOWN DISPOSABLE) ×2 IMPLANT
KIT BASIN OR (CUSTOM PROCEDURE TRAY) ×2 IMPLANT
KIT MARKER MARGIN INK (KITS) ×2 IMPLANT
KIT ROOM TURNOVER OR (KITS) ×2 IMPLANT
NEEDLE HYPO 25GX1X1/2 BEV (NEEDLE) ×2 IMPLANT
NS IRRIG 1000ML POUR BTL (IV SOLUTION) ×2 IMPLANT
PACK GENERAL/GYN (CUSTOM PROCEDURE TRAY) ×2 IMPLANT
PAD ARMBOARD 7.5X6 YLW CONV (MISCELLANEOUS) ×2 IMPLANT
SPONGE LAP 4X18 X RAY DECT (DISPOSABLE) ×2 IMPLANT
STAPLER VISISTAT 35W (STAPLE) ×2 IMPLANT
SUT MNCRL AB 4-0 PS2 18 (SUTURE) ×2 IMPLANT
SUT SILK 2 0 SH (SUTURE) ×2 IMPLANT
SUT VIC AB 3-0 SH 18 (SUTURE) ×4 IMPLANT
SYR CONTROL 10ML LL (SYRINGE) ×2 IMPLANT
TOWEL OR 17X24 6PK STRL BLUE (TOWEL DISPOSABLE) ×2 IMPLANT
TOWEL OR 17X26 10 PK STRL BLUE (TOWEL DISPOSABLE) ×2 IMPLANT
WATER STERILE IRR 1000ML POUR (IV SOLUTION) IMPLANT

## 2011-01-22 NOTE — Anesthesia Procedure Notes (Signed)
Procedure Name: Intubation Date/Time: 01/22/2011 3:49 PM Performed by: Romie Minus Pre-anesthesia Checklist: Patient identified, Emergency Drugs available, Suction available and Patient being monitored Patient Re-evaluated:Patient Re-evaluated prior to inductionOxygen Delivery Method: Circle System Utilized Preoxygenation: Pre-oxygenation with 100% oxygen Intubation Type: IV induction Ventilation: Mask ventilation without difficulty Grade View: Grade II Tube type: Oral Tube size: 7.5 mm Number of attempts: 1 Airway Equipment and Method: video-laryngoscopy Placement Confirmation: ETT inserted through vocal cords under direct vision,  positive ETCO2 and breath sounds checked- equal and bilateral Secured at: 22 cm Tube secured with: Tape Dental Injury: Teeth and Oropharynx as per pre-operative assessment

## 2011-01-22 NOTE — Anesthesia Postprocedure Evaluation (Signed)
  Anesthesia Post-op Note  Patient: [name]  Procedure(s) Performed:  LUMPECTOMY - Needle localization  @ [facility]  12:30  Patient Location: PACU  Anesthesia Type: General  Level of Consciousness: awake  Airway and Oxygen Therapy: Patient Spontanous Breathing  Post-op Pain: none  Post-op Assessment: Post-op Vital signs reviewed  Post-op Vital Signs: stable  Complications: No apparent anesthesia complications

## 2011-01-22 NOTE — Op Note (Signed)
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 She is seen by myself, and medical and radiation oncology. After review of her situation discussion was held and we elected to proceed to a needle guided excision  Description of Procedure: the patient was seen in the preop area and the transfer the procedure reviewed again. All questions were answered. I marked the left breast as the operative side. I reviewed the localizing films. The guidewire entered inferiorly and tracks somewhat superiorly.  The patient was taken to the operating room and after satisfactory general endotracheal anesthesia had been obtained the left breast was prepped and draped. A time out was performed.  It appeared that there were several centimeters between the guidewire entry site and the cancer. I therefore made a transverse incision about 3 cm above the guidewire entry point. I divided 1 cm of subcutaneous tissue.  I then raised a flap inferiorly and manipulated the guidewire into the wound. I grasped the breast tissue on either side of the guidewire and did a wide excisional biopsy of the tissue around the guidewire going down to chest wall. Since this went through obliquely there was a fairly long posterior and superior segments. I divided the specimen he on the guidewire and took a specimen mammogram. The clip and the abnormality appeared to be in the x-ray.  I went back and took a little extra posterior margin as there was a small amount of tissue still on the fascia so that I had a complete posterior margin. I also took some  additional superior margin to include the superior portion of the medial and lateral margins. All tissue was inked for margins for the pathologist.  I then make sure everything was dry. I put 30 cc of 0.25% plain Marcaine to help with postop pain relief. I irrigated again and checked for hemostasis and assure that everything was dry. I put clips and to mark all of the margins. I then closed in layers with 3-0 Vicryl, 4-0 Monocryl, and Dermabond.  The patient tolerated the procedure well. There were no operative complications. All counts were correct.[name]

## 2011-01-22 NOTE — Anesthesia Preprocedure Evaluation (Addendum)
Anesthesia Evaluation  Patient identified by MRN, date of birth, ID band Patient awake and Patient confused    Reviewed: Allergy & Precautions, H&P , NPO status , Patient's Chart, lab work & pertinent test results, reviewed documented beta blocker date and time   Airway Mallampati: II TM Distance: >3 FB Neck ROM: Full   Comment: permant bridge Dental  (+) Dental Advisory Given   Pulmonary former smoker (quit in 1983) Pt snores clear to auscultation  Pulmonary exam normal       Cardiovascular hypertension, Pt. on medications Regular Normal    Neuro/Psych    GI/Hepatic GERD-  Medicated and Controlled,  Endo/Other  Morbid obesityThyroid biopsy of nodule 3 times --> negative  Renal/GU      Musculoskeletal  (+) Arthritis -, Osteoarthritis,    Abdominal (+) obese,   Peds  Hematology   Anesthesia Other Findings   Reproductive/Obstetrics                        Anesthesia Physical Anesthesia Plan  ASA: II  Anesthesia Plan: General   Post-op Pain Management:    Induction: Intravenous  Airway Management Planned: LMA  Additional Equipment:   Intra-op Plan:   Post-operative Plan:   Informed Consent: I have reviewed the patients History and Physical, chart, labs and discussed the procedure including the risks, benefits and alternatives for the proposed anesthesia with the patient or authorized representative who has indicated his/her understanding and acceptance.   Dental advisory given  Plan Discussed with: CRNA and Surgeon  Anesthesia Plan Comments: (Plan routine monitors, GA, LMA OK)        Anesthesia Quick Evaluation

## 2011-01-22 NOTE — Transfer of Care (Signed)
Immediate Anesthesia Transfer of Care Note  Patient: [name]  Procedure(s) Performed:  LUMPECTOMY - Needle localization  @ [facility]  12:30  Patient Location: PACU  Anesthesia Type: General  Level of Consciousness: awake  Airway & Oxygen Therapy: Patient Spontanous Breathing and Patient connected to nasal cannula oxygen  Post-op Assessment: Report given to PACU RN and Post -op Vital signs reviewed and stable  Post vital signs: stable  Complications: No apparent anesthesia complications

## 2011-01-22 NOTE — Preoperative (Signed)
Beta Blockers   Reason not to administer Beta Blockers:Not Applicable 

## 2011-01-22 NOTE — Interval H&P Note (Signed)
History and Physical Interval Note:   01/22/2011   12:36 PM   [name]  has presented today for surgery, with the diagnosis of Left breast cancer  The various methods of treatment have been discussed with the patient and family. After consideration of risks, benefits and other options for treatment, the patient has consented to  Procedure(s): LUMPECTOMY as a surgical intervention .  The patients' history has been reviewed, patient examined, no change in status, stable for surgery.  I have reviewed the patients' chart and labs.  Questions were answered to the patient's satisfaction.     [name]  MD

## 2011-01-22 NOTE — H&P (Signed)
Progress Notes ITZIA CUNLIFFE (MR# [id number])         Progress Notes Info       Author  Note Status  Last Update User  Last Update Date/Time    [name], MD  Signed  [name], MD  01/08/2011 4:01 PM          Progress Notes     NAME: [name] DOB: February 14, 1932  CC:     Chief Complaint     Patient presents with     .  Breast Cancer     HPI:  [name] is a 77 y.o. female who presents with newly diagnosed left breast cancer. 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 She comes to the clinic for discussion on treatment alternatives. She has never had a significant breast problems although she did have a benign breast cyst removed many years ago. She has no family history of breast cancer.  PMH:  has a past medical history of Breast cancer, Left, DCIS (01/01/2011).  PSH:  has past surgical history that includes Tonsillectomy (1944); Breast biopsy (1974); Breast biopsy (1977); and Abdominal hysterectomy.  ALLERGIES:     Allergies     Allergen  Reactions     .  Tape  Rash     .  Penicillins  Hives     MEDICATIONS:     Current Outpatient Prescriptions     Medication  Sig  Dispense  Refill     .  Multiple Vitamins-Minerals (MULTIVITAMIN WITH MINERALS) tablet  Take by mouth daily.       [name]  omeprazole (PRILOSEC) 10 MG capsule  Take 10 mg by mouth daily.       ROS:  Her 12 point review of systems form is negative except for the fact she gets a little short of breath walking up stairs and sleeps on more than one pillow and has had some heartburn arthritis and difficulty walking up steps. She's also been diagnosed with a benign thyroid nodule.  EXAM:  GENERAL:  The patient is alert, oriented, and generally healthy-appearing, NAD. Mood and affect are normal.  HEENT:  The head is normocephalic, the eyes nonicteric, the pupils were round regular and equal. EOMs are  normal. Pharynx normal. Dentition good.  NECK:  The neck is supple and there are no masses or thyromegaly.  LUNGS:  Normal respirations and clear to auscultation.  HEART:  Regular rhythm, with no murmurs rubs or gallops. Pulses are intact carotid dorsalis pedis and posterior tibial. No significant varicosities are noted.  BREASTS:  The breasts are symmetric in appearance. They're fairly large. There is no dominant mass.  LYMPHATICS:  There are no abnormal lymph nodes in the axilla or supraclavicular areas.  ABDOMEN:  Soft, flat, and nontender. No masses or organomegaly is noted. No hernias are noted. Bowel sounds are normal.  EXTREMITIES:  Good range of motion, no edema.  DATA REVIEWED:  I seen her mammogram and MRI films and reports her pathology slides and reports.  IMPRESSION:  Clinical stage II left breast cancer posterior lateral central left breast  PLAN:  I think a needle localized lumpectomy as the best option although she could have a mastectomy. She is very discussed the situation with the medical and radiation oncologists and would prefer to proceed with lumpectomy.I have discussed the indications  for the lumpectomy and described the procedure. She understand that the chance of removal of the abnormal area is very good, but that occasionally we are unable to locate it and may have to do a second procedure. We also discussed the possibility of a second procedure to get additional tissue. Risks of surgery such as bleeding and infection have also been explained, as well as the implications of not doing the surgery.  She understands and wishes to proceed.

## 2011-01-23 NOTE — Progress Notes (Signed)
Chaplain provided pastoral presence, conversation, and prayer to family of patient.

## 2011-01-27 ENCOUNTER — Telehealth (INDEPENDENT_AMBULATORY_CARE_PROVIDER_SITE_OTHER): Payer: Self-pay

## 2011-01-27 ENCOUNTER — Encounter: Payer: Self-pay | Admitting: *Deleted

## 2011-01-27 NOTE — Telephone Encounter (Signed)
Called pt to notify her that her margins are clear per Dr Jamey Ripa and he would go over path report in more details when she comes in for her appt with Dr Streck./ [facility]

## 2011-01-28 ENCOUNTER — Encounter (HOSPITAL_COMMUNITY): Payer: Self-pay | Admitting: Surgery

## 2011-02-07 ENCOUNTER — Encounter: Payer: Self-pay | Admitting: *Deleted

## 2011-02-07 NOTE — Progress Notes (Addendum)
Pt 01/22/11 left breast lumpectomy,low grade ductal ca in situ ,ER?PR=Positive Dx 01/01/11  S/p benign l breast bxs 1974& 1977,  Divorced,gx,p1, retired Radio producer,  menarche age 77, Hrt  4 years,stopped 1990,used birth control pills 1 year,      Allergies:tape=rash,Pcn=hives

## 2011-02-10 ENCOUNTER — Ambulatory Visit: Payer: Medicare Other

## 2011-02-10 ENCOUNTER — Ambulatory Visit: Payer: Medicare Other | Admitting: Radiation Oncology

## 2011-02-11 ENCOUNTER — Encounter (INDEPENDENT_AMBULATORY_CARE_PROVIDER_SITE_OTHER): Payer: Self-pay | Admitting: Surgery

## 2011-02-11 ENCOUNTER — Ambulatory Visit (INDEPENDENT_AMBULATORY_CARE_PROVIDER_SITE_OTHER): Payer: Medicare Other | Admitting: Surgery

## 2011-02-11 VITALS — BP 134/70 | HR 76 | Resp 16 | Ht 63.0 in | Wt 233.0 lb

## 2011-02-11 DIAGNOSIS — R590 Localized enlarged lymph nodes: Secondary | ICD-10-CM

## 2011-02-11 DIAGNOSIS — R599 Enlarged lymph nodes, unspecified: Secondary | ICD-10-CM

## 2011-02-11 DIAGNOSIS — C50919 Malignant neoplasm of unspecified site of unspecified female breast: Secondary | ICD-10-CM

## 2011-02-11 NOTE — Progress Notes (Signed)
[name] Medico    [id number] 02/11/2011    09/18/1931   CC: Post op Left NL Lumpectomy  HPI: The patient returns for post op follow-up. She underwent a left lumpectomy on 01/22/2011. Over all she feels that she is doing well. She has had no pain since surgery  PE: The incision is healing nicely and there is no evidence of infection or hematoma.   DATA REVIEWED: Pathology report showed DCIS, 6mm, receptor +, 1cm margin  IMPRESSION: Patient doing well.   PLAN: Her next visit will be in six months. I reviewed her path report with her and gave her a copy. I encouraged her to followup with Dr Darnelle Catalan and Dr Roselind Messier for their recommendations based on the final path report.

## 2011-02-11 NOTE — Patient Instructions (Signed)
  Visit with me should be in six months unless you had radiation. If you have radiation I would like to see about a month after the radiation has completed. You should have a mammogram every year

## 2011-02-12 DIAGNOSIS — R590 Localized enlarged lymph nodes: Secondary | ICD-10-CM | POA: Insufficient documentation

## 2011-02-12 NOTE — Progress Notes (Signed)
Addended byLiliana Cline on: 02/12/2011 02:46 PM   Modules accepted: Orders

## 2011-02-13 ENCOUNTER — Ambulatory Visit (HOSPITAL_BASED_OUTPATIENT_CLINIC_OR_DEPARTMENT_OTHER): Payer: Medicare Other | Admitting: Oncology

## 2011-02-13 ENCOUNTER — Other Ambulatory Visit: Payer: Medicare Other | Admitting: Lab

## 2011-02-13 VITALS — BP 151/74 | HR 73 | Temp 98.4°F | Ht 63.0 in | Wt 235.8 lb

## 2011-02-13 DIAGNOSIS — D0590 Unspecified type of carcinoma in situ of unspecified breast: Secondary | ICD-10-CM

## 2011-02-13 DIAGNOSIS — C50919 Malignant neoplasm of unspecified site of unspecified female breast: Secondary | ICD-10-CM

## 2011-02-13 DIAGNOSIS — Z170 Estrogen receptor positive status [ER+]: Secondary | ICD-10-CM

## 2011-02-13 NOTE — Progress Notes (Signed)
CC:   Gretta Arab. [name], M.D. [name], M.D. [name], Ph.D., M.D. [name], M.D.  [name] is a 77 year old [place] woman referred by Dr. Jamey Ripa for evaluation and treatment in the setting of newly diagnosed breast cancer.  The patient had screening mammography in February 2012 which showed some calcifications in the left breast.  She was brought back for additional views in March, and this showed a few minimally heterogeneous and punctate calcifications in the upper outer portion of the left breast spanning up to 2 cm.  There was no associated mass or distortion.  After appropriate discussion, it was felt that repeat mammography in 6 months would be appropriate, and the patient returned October 17th for diagnostic left mammography.  At this time, the calcifications appeared to be increasing and were more linear.  Again, there was no evidence of an associated mass or distortion.  A second smaller cluster of punctate calcifications in the upper outer left breast was unchanged.  The patient does have heterogeneously dense breast tissue which, of course, decreases mammographic sensitivity.  Given this information, the patient returned a week later, on October 24th, for a biopsy of the left breast calcification area, and this showed [phone]) a low-grade ductal carcinoma in situ which was estrogen receptor 99% and progesterone receptor 100% positive. With this information, the patient was referred to Dr. Jamey Ripa, and after undergoing right diagnostic mammography October 25th, which was unremarkable, had bilateral breast MRIs on October 30th.  This showed an area of clumped and linear enhancement along the biopsy tract measuring 1.2 cm.  There was no suspicious mass or abnormal enhancement and no internal mammary or axillary adenopathy.  Bilateral thyroid masses were incidentally imaged.  The patient was evaluated at the multidisciplinary breast cancer  clinic, and it was felt proceeding 2 breast conserving surgery was appropriate. Accordingly on 01/22/2011 the patient underwent left lumpectomy with the final pathology ([id number]) confirming a ductal carcinoma in situ, low-grade, measuring 6 mm, with ample margins.  PAST MEDICAL HISTORY:  Significant for EGRD, osteoarthritis especially involving the knees, obesity, status post tonsillectomy and adenoidectomy, status post simple hysterectomy with no salpingo- oophorectomy in 1986, status post benign left breast biopsies in 1974 and 1977.  FAMILY HISTORY:  The patient's father died at the age of 93.  The patient's mother died at the age of 51 following a stroke.  The patient was an only child.  The patient's mother's mother was diagnosed with breast cancer at the age of 66.  The only other cancer that she is aware of in the family is colon cancer in a paternal uncle.  There is no history of ovarian cancer in the family.  GYNECOLOGIC HISTORY:  She had menarche at age 74.  Last period at the time of her hysterectomy in 1986.  She used hormone replacement for approximately 4 years, stopping in 1990.  She is GX P1, used birth control pills for approximately 1 year remotely.  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 Her daughter, [name], teaches math here in [place], although she is currently semiretired because of disability.  The patient lives with [name] and [name]'s 3 grandchildren, ages 68 and 70 year old twins.  HEALTH MAINTENANCE:  The patient never smoked.  Does not abuse alcohol. Had her most recent colonoscopy under [name] in 2009.  Had a bone density at [facility] in 2010 which  was "okay."  Had her last Pap smear in the year 2000.  Does not know her cholesterol level.  ALLERGIES:  Penicillin causes itching, and she gets a rash with paper tape.  MEDICATIONS:  Include  laucoma eyedrops, Prilosec, multivitamin, calcium, minerals and omeprazole.  REVIEW OF SYSTEMS:  She gets short of breath walking up stairs and sleeps on 2 pillows, has a history of heartburn which has not increased recently and some difficulty walking, particularly up steps because of knee pain.  She has a benign goiter which has been previously evaluated. Otherwise, a separately scanned detailed review of systems was noncontributory.  PHYSICAL EXAMINATION:  General:  [name] is an elderly African American woman who is 5 feet 3 inches tall and weighs 234 pounds.  Vital Signs: Temperature is 97.7, pulse 83, respiratory rate 20, blood pressure 126/74.  HEENT:  Sclerae are not icteric.  Oropharynx clear.  Neck: Supple.  Lungs:  No crackles or wheezes.  Heart:  Regular rate and rhythm.  Right Breast:  Unremarkable.  Left Breast:  Shows no obvious mass, nipple retraction, or skin change.  Abdomen:  Obese, benign. Musculoskeletal Exam:  No focal spinal tenderness.  No peripheral edema. Neurologic Exam:  Nonfocal.  LABORATORY DATA:  Show a white cell count of 4.9, hemoglobin 13.3, platelets 227,000, glucose nonfasting 167, creatinine 0.9.  Normal liver function tests.  Normal albumin and calcium.  FILMS:  As already noted.  In addition, the patient had ultrasound of the thyroid in April of 2009 showing no change as compared to a year prior.  Biopsy of the thyroid in April 2008 showed follicular epithelium in a microfollicular pattern which is consistent with goiter, although adenomatous nodules could look similar  ([id number]).  Nuclear medicine thyroid scan obtained April 2007 showed a large area of decreased uptake in the lower pole of the left thyroid, which is the area biopsied as described above.  CT of the chest obtained in 2007 showed a moderately large hiatal hernia, fatty liver but was otherwise unremarkable.  IMPRESSION:  77 year old [place] woman, status post  left Lumpectomy NOV 2012 for a low-grade ductal carcinoma in situ, with negative and ample margins, strongly estrogen and progesterone receptor positive.  PLAN: We spent the better part of her 40 minute visit today discussing anti-estrogens and irradiation as adjuvant treatment for ductal carcinoma in situ. We reinforced the fact that because this tumor was noninvasive we do not need to do any films or labs to assess for metastatic disease or other organ involvement. She does have a risk of recurrence in the ipsilateral breast. This risk can be reduced by radiation and also by anti-estrogen pills. The anti-estrogens in addition will reduce the risk of a new breast cancer developing in either breast. Finally for women over 70 in her situation it is safe to forego radiation if she does take the anti-estrogen pills for 5 years.  For all those reasons I felt comfortable recommending anti-estrogens only, and foregoing radiation. Ms.Colburn was very agreeable to this. We then discussed the difference between tamoxifen and aromatase inhibitors and considering all factors including the fact that she had a normal bone density about a year ago I think letrozole would be a better choice for her.  We then discussed the possible toxicities side effects and complications of letrozole. I went ahead and wrote her a prescription. She is going to see me again in March. If she is tolerating the medication well I will start seeing her on a once  a year basis thereafter. ______________________________ Lowella Dell, M.D. GCM/MEDQ  D:  01/10/2011  T:  01/13/2011  Job:  [id number]

## 2011-02-14 ENCOUNTER — Telehealth: Payer: Self-pay | Admitting: *Deleted

## 2011-02-14 NOTE — Telephone Encounter (Signed)
per orders gave patient appointment for 05-2011

## 2011-02-17 ENCOUNTER — Other Ambulatory Visit: Payer: Self-pay | Admitting: *Deleted

## 2011-02-17 DIAGNOSIS — C50919 Malignant neoplasm of unspecified site of unspecified female breast: Secondary | ICD-10-CM

## 2011-02-17 MED ORDER — LETROZOLE 2.5 MG PO TABS: 2.5000 mg | ORAL_TABLET | Freq: Every day | ORAL | Status: AC

## 2011-05-06 DIAGNOSIS — H4010X0 Unspecified open-angle glaucoma, stage unspecified: Secondary | ICD-10-CM | POA: Diagnosis not present

## 2011-05-06 DIAGNOSIS — H409 Unspecified glaucoma: Secondary | ICD-10-CM | POA: Diagnosis not present

## 2011-05-19 ENCOUNTER — Ambulatory Visit
Admission: RE | Admit: 2011-05-19 | Discharge: 2011-05-19 | Disposition: A | Discharge status: Home / Self Care | Payer: Medicare Other | Source: Ambulatory Visit | Attending: Family Medicine | Admitting: Family Medicine

## 2011-05-19 ENCOUNTER — Other Ambulatory Visit: Payer: Self-pay | Admitting: Family Medicine

## 2011-05-19 DIAGNOSIS — E7800 Pure hypercholesterolemia, unspecified: Secondary | ICD-10-CM | POA: Diagnosis not present

## 2011-05-19 DIAGNOSIS — M19049 Primary osteoarthritis, unspecified hand: Secondary | ICD-10-CM | POA: Diagnosis not present

## 2011-05-19 DIAGNOSIS — M25539 Pain in unspecified wrist: Secondary | ICD-10-CM | POA: Diagnosis not present

## 2011-05-19 DIAGNOSIS — I10 Essential (primary) hypertension: Secondary | ICD-10-CM | POA: Diagnosis not present

## 2011-05-19 DIAGNOSIS — D0590 Unspecified type of carcinoma in situ of unspecified breast: Secondary | ICD-10-CM | POA: Diagnosis not present

## 2011-05-19 DIAGNOSIS — E119 Type 2 diabetes mellitus without complications: Secondary | ICD-10-CM | POA: Diagnosis not present

## 2011-05-20 ENCOUNTER — Telehealth: Payer: Self-pay | Admitting: *Deleted

## 2011-05-20 ENCOUNTER — Ambulatory Visit (HOSPITAL_BASED_OUTPATIENT_CLINIC_OR_DEPARTMENT_OTHER): Payer: Medicare Other | Admitting: Oncology

## 2011-05-20 VITALS — BP 144/75 | HR 72 | Temp 98.6°F | Ht 63.0 in | Wt 234.6 lb

## 2011-05-20 DIAGNOSIS — C50919 Malignant neoplasm of unspecified site of unspecified female breast: Secondary | ICD-10-CM | POA: Diagnosis not present

## 2011-05-20 NOTE — Telephone Encounter (Signed)
patient stated she will get the labs at her primary doctor

## 2011-05-20 NOTE — Telephone Encounter (Signed)
gave patient appointment for 07-2012 printed out calendar and gave to the patient 

## 2011-05-20 NOTE — Progress Notes (Signed)
ID: ANNITTA FIFIELD   DOB: [birth year]  MR#: [id number]  NUU#:[id number]  HISTORY OF PRESENT ILLNESS: The patient had screening mammography in February 2012 which showed some calcifications in the left breast.  She was brought back for additional views in March, and this showed a few minimally heterogeneous and punctate calcifications in the upper outer portion of the left breast spanning up to 2 cm.  There was no associated mass or distortion.  After appropriate discussion, it was felt that repeat mammography in 6 months would be appropriate, and the patient returned October 17th for diagnostic left mammography.  At this time, the calcifications appeared to be increasing and were more linear.  Again, there was no evidence of an associated mass or distortion.  A second smaller cluster of punctate calcifications in the upper outer left breast was unchanged.  The patient does have heterogeneously dense breast tissue which, of course, decreases mammographic sensitivity.   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  With this information, the patient was referred to Dr. Jamey Ripa, and after undergoing right diagnostic mammography October 25th, which was unremarkable, had bilateral breast MRIs on October 30th.  This showed an area of clumped and linear enhancement along the biopsy tract measuring 1.2 cm.  There was no suspicious mass or abnormal enhancement and no internal mammary or axillary adenopathy.  Bilateral thyroid masses were incidentally imaged.   She underwent Left lumpectomy 01/22/2011 for a 0.6 cm DCIS, low grade, with ample margins and started letrozole December 2012  INTERVAL HISTORY: Ms. Lindvall returns for followup of her noninvasive breast cancer. Since her last visit here she started  her letrozole. She also had some pain in her left wrist and brought this to Dr. Jone Baseman attention, with plain films of the wrist showing no fracture as noted below.  REVIEW OF SYSTEMS: She had no symptoms at all the first month but sometime in February she started having hot flashes. They are a nuisance to her. She has about 4 a day, with some sweating. She has some at night to but they don't wake her up. She has had no problems with vaginal dryness or arthralgias/myalgias. She does continue to have problems with her knees. Aside from these symptoms, she keeps a runny nose, a little bit of a sore throat, had an episode of viral gastroenteritis lasting about 24 hours about 2 months ago, has fairly chronic shortness of breath with any significant activity due to deconditioning she feels premises I believe correctly). She has noted some dimpling in her left breast which is an expected postsurgical change described below. There has been no cough, phlegm production, pleurisy, or hemoptysis. There has been no fever, rash or bleeding. A detailed review of systems was otherwise noncontributory  PAST MEDICAL HISTORY: Past Medical History  Diagnosis Date  . 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 Hypertension   . Thyroid nodule   . GERD (gastroesophageal reflux disease)   . Arthritis     knees  . Normal cardiac stress test 2000    done at [facility]    PAST SURGICAL HISTORY: Past Surgical History  Procedure Date  . Tonsillectomy 1944  . Adenoidectomy   . Breast biopsy 1974    right  . Breast biopsy 1977    left  . Breast lumpectomy  w/ needle localization 01/22/2011    Left - Dr Jamey Ripa  . Breast lumpectomy 01/22/2011    Procedure: LUMPECTOMY;  Surgeon: [name], MD;  Location: [facility] OR;  Service: General;  Laterality: Left;  Needle localization  @ [facility]  12:30  . Abdominal hysterectomy     partial  . Colonoscopy 2009    FAMILY HISTORY Family History  Problem Relation Age of Onset  .  Cancer Paternal Aunt   . Cancer Paternal Uncle 39    colon  . Cancer Maternal Grandmother 21    breast  The patient's father died at the age of 4.  The patient's mother died at the age of 73 following a stroke.  The patient was an only child.  The patient's mother's mother was diagnosed with breast cancer at the age of 35.  The only other cancer that she is aware of in the family is colon cancer in a paternal uncle.  There is no history of ovarian cancer in the family.  GYNECOLOGIC HISTORY: She had menarche at age 78.  Last period at the time of her hysterectomy in 1986.  No BSO. She used hormone replacement for approximately 4 years, stopping in 1990.  She is GX P1, used birth control pills for approximately 1 year remotely.  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  Her daughter, [name], teaches math here in [place], although she is currently semiretired because of disability.  The patient lives with [name] and [name]'s 3 grandchildren, ages 78 and 71 year old twins.   ADVANCED DIRECTIVES:  HEALTH MAINTENANCE: History  Substance Use Topics  . Smoking status: Former Smoker    Quit date: 03/10/1981  . Smokeless tobacco: Not on file  . Alcohol Use: No     Colonoscopy: 2009 (Buccini)  PAP: s/p hysterectomy  Bone density: [facility] 2010 "OK"  Lipid panel:  Allergies  Allergen Reactions  . Penicillins Hives  . Tape Rash    Current Outpatient Prescriptions  Medication Sig Dispense Refill  . B Complex-C (SUPER B COMPLEX PO) Take 1 tablet by mouth daily.        . benazepril (LOTENSIN) 10 MG tablet Take 10 mg by mouth daily.        [name] Bioflavonoid Products (GRAPE SEED PO) Take 1 tablet by mouth daily. Grape seed 50mg  & magnesium 15mg  OTC       . Calcium Carbonate (CALCIUM 600 PO) Take 1 tablet by mouth 2 (two) times daily.        . Cholecalciferol (VITAMIN D-3 PO) Take 1,000 Units  by mouth daily.        . Coenzyme Q10 (COQ-10 PO) Take 1 capsule by mouth daily.        [name] CRANBERRY PO Take 1 capsule by mouth daily.        [name] ezetimibe-simvastatin (VYTORIN) 10-40 MG per tablet Take 1 tablet by mouth daily.        . Garlic 1000 MG CAPS Take 1,000 mg by mouth daily.        Chilton Si Tea, Camillia sinensis, 315 MG CAPS Take 315 mg by mouth daily. otc       . Multiple Vitamins-Minerals (MULTIVITAMIN WITH MINERALS) tablet Take by mouth daily.        [name] omega-3 acid ethyl esters (LOVAZA) 1 G capsule Take 1 g by mouth daily.        [name]  omeprazole (PRILOSEC) 10 MG capsule Take 10 mg by mouth daily.        . travoprost, benzalkonium, (TRAVATAN) 0.004 % ophthalmic solution Place 1 drop into both eyes daily.        . vitamin C (ASCORBIC ACID) 500 MG tablet Take 1,000 mg by mouth daily.          OBJECTIVE: Elderly woman who appears comfortable Filed Vitals:   05/20/11 1151  BP: 144/75  Pulse: 72  Temp: 98.6 F (37 C)     Body mass index is 41.56 kg/(m^2).    ECOG FS:2  Sclerae unicteric Oropharynx clear No peripheral adenopathy Lungs no rales or rhonchi Heart regular rate and rhythm Abd obese, benign MSK scoliosis but no focal spinal tenderness, no peripheral edema Neuro: nonfocal Breasts: Right breast no suspicious findings; left breast status post lumpectomy; the incision has healed very nicely with minimal subjacent induration; no evidence of local recurrence  LAB RESULTS: Lab Results  Component Value Date   WBC 6.8 01/15/2011   NEUTROABS 3.2 01/08/2011   HGB 13.2 01/15/2011   HCT 40.8 01/15/2011   MCV 89.9 01/15/2011   PLT 206 01/15/2011      Chemistry      Component Value Date/Time   NA 139 01/22/2011 1431   K 4.1 01/22/2011 1431   CL 103 01/22/2011 1431   CO2 26 01/22/2011 1431   BUN 16 01/22/2011 1431   CREATININE 0.92 01/22/2011 1431      Component Value Date/Time   CALCIUM 10.4 01/22/2011 1431   ALKPHOS 76 01/08/2011 1248   AST 22 01/08/2011 1248   ALT  18 01/08/2011 1248   BILITOT 0.3 01/08/2011 1248       No results found for this basename: LABCA2    No components found with this basename: LABCA125    No results found for this basename: INR:1;PROTIME:1 in the last 168 hours  Urinalysis No results found for this basename: colorurine, appearanceur, labspec, phurine, glucoseu, hgbur, bilirubinur, ketonesur, proteinur, urobilinogen, nitrite, leukocytesur    STUDIES: Dg Wrist Complete Left  25-May-2011  *RADIOLOGY REPORT*  Clinical Data: Wrist pain  LEFT WRIST - COMPLETE 3+ VIEW  Comparison: None.  Findings: No acute fracture is seen.  There appears be an old well corticated fracture of the ulnar styloid.  The carpal bones are normal position.  There is some degenerative change at the articulation of the base of the first metacarpal with the trapezium.  Alignment is normal.  IMPRESSION: Mild degenerative and/or post-traumatic change.  No acute abnormality.  Original Report Authenticated By: Juline Patch, M.D.    ASSESSMENT: 78 year old [place] woman, status post Left  lumpectomy NOV 2012 for a low-grade ductal carcinoma in situ, with negative and ample margins, strongly estrogen and progesterone receptor positive.   PLAN: Overall she is tolerating the letrozole well. I reassured her that her hot flashes will fit away over the next several months. If she does develop the arthralgias/myalgias symptoms that some patients gad, she will let us know. Otherwise we will start seeing her on a once a year basis until she completes her 5 years of treatment.  I usually do not cocaine lab work in my DCIS patients, leaving that to their primary care physician. She had a normal bone density prior to the start of treatment and it might be worthwhile repeating that perhaps a year into her therapy. The patient knows to call for any problems that may develop before the next visit   Nyjah Schwake  C    05/20/2011

## 2011-08-12 ENCOUNTER — Encounter (INDEPENDENT_AMBULATORY_CARE_PROVIDER_SITE_OTHER): Payer: Self-pay | Admitting: Surgery

## 2011-08-12 ENCOUNTER — Ambulatory Visit (INDEPENDENT_AMBULATORY_CARE_PROVIDER_SITE_OTHER): Payer: Medicare Other | Admitting: Surgery

## 2011-08-12 VITALS — BP 124/62 | HR 76 | Temp 98.1°F | Resp 16 | Ht 63.5 in | Wt 225.8 lb

## 2011-08-12 DIAGNOSIS — Z853 Personal history of malignant neoplasm of breast: Secondary | ICD-10-CM | POA: Diagnosis not present

## 2011-08-12 NOTE — Progress Notes (Signed)
NAME: [name]       DOB: 1931/06/13           DATE: 08/12/2011       MRN: [id number]   [name] is a 78 y.o.Marland Kitchenfemale who presents for routine followup of her Left breast cancer, DCISdiagnosed in 2012 and treated with Lumpectomy and now on Letrozole. She has no problems or concerns on either side.  PFSH: She has had no significant changes since the last visit here.  ROS: There have been no significant changes since the last visit here  EXAM:  VS: BP 124/62  Pulse 76  Temp(Src) 98.1 F (36.7 C) (Temporal)  Resp 16  Ht 5' 3.5" (1.613 m)  Wt 225 lb 12.8 oz (102.422 kg)  BMI 39.37 kg/m2   General: The patient is alert, oriented, generally healty appearing, NAD. Mood and affect are normal.  Breasts:  Right is normal. Left shows a little loss of tissue at the lumpectomy site, LOQ. Other than that wnl  Lymphatics: She has no axillary or supraclavicular adenopathy on either side.  Extremities: Full ROM of the surgical side with no lymphedema noted.  Data Reviewed: No new data  Impression: Doing well, with no evidence of recurrent cancer or new cancer  Plan: RTC 6 months for a 1 year psot op follow up.

## 2011-09-01 DIAGNOSIS — K219 Gastro-esophageal reflux disease without esophagitis: Secondary | ICD-10-CM | POA: Diagnosis not present

## 2011-09-01 DIAGNOSIS — I10 Essential (primary) hypertension: Secondary | ICD-10-CM | POA: Diagnosis not present

## 2011-09-01 DIAGNOSIS — D0590 Unspecified type of carcinoma in situ of unspecified breast: Secondary | ICD-10-CM | POA: Diagnosis not present

## 2011-09-01 DIAGNOSIS — E7800 Pure hypercholesterolemia, unspecified: Secondary | ICD-10-CM | POA: Diagnosis not present

## 2011-09-01 DIAGNOSIS — E119 Type 2 diabetes mellitus without complications: Secondary | ICD-10-CM | POA: Diagnosis not present

## 2011-09-23 DIAGNOSIS — R609 Edema, unspecified: Secondary | ICD-10-CM | POA: Diagnosis not present

## 2011-11-11 DIAGNOSIS — H4011X0 Primary open-angle glaucoma, stage unspecified: Secondary | ICD-10-CM | POA: Diagnosis not present

## 2011-11-11 DIAGNOSIS — H409 Unspecified glaucoma: Secondary | ICD-10-CM | POA: Diagnosis not present

## 2011-12-03 DIAGNOSIS — Z23 Encounter for immunization: Secondary | ICD-10-CM | POA: Diagnosis not present

## 2011-12-03 DIAGNOSIS — E119 Type 2 diabetes mellitus without complications: Secondary | ICD-10-CM | POA: Diagnosis not present

## 2011-12-03 DIAGNOSIS — Z1331 Encounter for screening for depression: Secondary | ICD-10-CM | POA: Diagnosis not present

## 2011-12-03 DIAGNOSIS — Z9181 History of falling: Secondary | ICD-10-CM | POA: Diagnosis not present

## 2011-12-03 DIAGNOSIS — I10 Essential (primary) hypertension: Secondary | ICD-10-CM | POA: Diagnosis not present

## 2011-12-03 DIAGNOSIS — E7800 Pure hypercholesterolemia, unspecified: Secondary | ICD-10-CM | POA: Diagnosis not present

## 2011-12-03 DIAGNOSIS — M2550 Pain in unspecified joint: Secondary | ICD-10-CM | POA: Diagnosis not present

## 2011-12-03 DIAGNOSIS — D0590 Unspecified type of carcinoma in situ of unspecified breast: Secondary | ICD-10-CM | POA: Diagnosis not present

## 2012-01-14 DIAGNOSIS — Z780 Asymptomatic menopausal state: Secondary | ICD-10-CM | POA: Diagnosis not present

## 2012-01-20 DIAGNOSIS — M545 Low back pain: Secondary | ICD-10-CM | POA: Diagnosis not present

## 2012-01-20 DIAGNOSIS — M47817 Spondylosis without myelopathy or radiculopathy, lumbosacral region: Secondary | ICD-10-CM | POA: Diagnosis not present

## 2012-01-20 DIAGNOSIS — M25539 Pain in unspecified wrist: Secondary | ICD-10-CM | POA: Diagnosis not present

## 2012-01-20 DIAGNOSIS — M4310 Spondylolisthesis, site unspecified: Secondary | ICD-10-CM | POA: Diagnosis not present

## 2012-02-12 ENCOUNTER — Other Ambulatory Visit: Payer: Self-pay | Admitting: Family Medicine

## 2012-02-12 DIAGNOSIS — Z853 Personal history of malignant neoplasm of breast: Secondary | ICD-10-CM

## 2012-02-12 DIAGNOSIS — Z9889 Other specified postprocedural states: Secondary | ICD-10-CM

## 2012-02-18 ENCOUNTER — Ambulatory Visit (INDEPENDENT_AMBULATORY_CARE_PROVIDER_SITE_OTHER): Payer: Medicare Other | Admitting: Surgery

## 2012-02-18 ENCOUNTER — Encounter (INDEPENDENT_AMBULATORY_CARE_PROVIDER_SITE_OTHER): Payer: Self-pay | Admitting: Surgery

## 2012-02-18 VITALS — BP 138/72 | HR 71 | Temp 98.0°F | Resp 20 | Ht 64.0 in | Wt 222.0 lb

## 2012-02-18 DIAGNOSIS — Z853 Personal history of malignant neoplasm of breast: Secondary | ICD-10-CM

## 2012-02-18 NOTE — Progress Notes (Signed)
NAME: [name]       DOB: Jan 25, 1932           DATE: 02/18/2012       MRN: [id number]   [name] is a 78 y.o.Marland Kitchenfemale who presents for routine followup of her Left breast cancer, DCIS diagnosed in 2012 and treated with Lumpectomy and now on Letrozole.She did not have radiation She has no problems or concerns on either side.  PFSH: She has had no significant changes since the last visit here.  ROS: There have been no significant changes since the last visit here  EXAM:  VS: BP 138/72  Pulse 71  Temp 98 F (36.7 C) (Temporal)  Resp 20  Ht 5\' 4"  (1.626 m)  Wt 222 lb (100.699 kg)  BMI 38.11 kg/m2   General: The patient is alert, oriented, generally healty appearing, NAD. Mood and affect are normal.  Breasts:  Right is normal. Left shows a little loss of tissue at the lumpectomy site, LOQ. Other than that wnl  Lymphatics: She has no axillary or supraclavicular adenopathy on either side.  Extremities: Full ROM of the surgical side with no lymphedema noted.  Data Reviewed: Mammogram scheduled  Impression: Doing well, with no evidence of recurrent cancer or new cancer  Plan: RTC one year

## 2012-02-18 NOTE — Patient Instructions (Signed)
Ask that your mammogram report be sent to me. See me again next year

## 2012-02-20 ENCOUNTER — Ambulatory Visit
Admission: RE | Admit: 2012-02-20 | Discharge: 2012-02-20 | Disposition: A | Discharge status: Home / Self Care | Payer: Medicare Other | Source: Ambulatory Visit | Attending: Family Medicine | Admitting: Family Medicine

## 2012-02-20 DIAGNOSIS — Z9889 Other specified postprocedural states: Secondary | ICD-10-CM

## 2012-02-20 DIAGNOSIS — R928 Other abnormal and inconclusive findings on diagnostic imaging of breast: Secondary | ICD-10-CM | POA: Diagnosis not present

## 2012-02-20 DIAGNOSIS — Z853 Personal history of malignant neoplasm of breast: Secondary | ICD-10-CM

## 2012-03-15 ENCOUNTER — Other Ambulatory Visit: Payer: Self-pay | Admitting: *Deleted

## 2012-03-15 DIAGNOSIS — C50919 Malignant neoplasm of unspecified site of unspecified female breast: Secondary | ICD-10-CM

## 2012-03-15 MED ORDER — LETROZOLE 2.5 MG PO TABS: 2.5000 mg | ORAL_TABLET | Freq: Every day | ORAL | Status: DC

## 2012-05-10 ENCOUNTER — Other Ambulatory Visit: Payer: Self-pay | Admitting: *Deleted

## 2012-05-10 MED ORDER — LETROZOLE 2.5 MG PO TABS: 2.5000 mg | ORAL_TABLET | Freq: Every day | ORAL | Status: DC

## 2012-05-11 ENCOUNTER — Other Ambulatory Visit: Payer: Self-pay | Admitting: Physician Assistant

## 2012-05-12 ENCOUNTER — Telehealth: Payer: Self-pay | Admitting: Oncology

## 2012-05-12 ENCOUNTER — Other Ambulatory Visit: Payer: Self-pay | Admitting: *Deleted

## 2012-05-12 MED ORDER — LETROZOLE 2.5 MG PO TABS: 2.5000 mg | ORAL_TABLET | Freq: Every day | ORAL | Status: DC

## 2012-05-12 NOTE — Telephone Encounter (Signed)
Moved 3/13 appt to 4/1 per AB - poof. S/w pt she is aware.

## 2012-05-17 DIAGNOSIS — H409 Unspecified glaucoma: Secondary | ICD-10-CM | POA: Diagnosis not present

## 2012-05-20 ENCOUNTER — Ambulatory Visit: Payer: Medicare Other | Admitting: Physician Assistant

## 2012-05-25 DIAGNOSIS — M159 Polyosteoarthritis, unspecified: Secondary | ICD-10-CM | POA: Diagnosis not present

## 2012-05-25 DIAGNOSIS — M1710 Unilateral primary osteoarthritis, unspecified knee: Secondary | ICD-10-CM | POA: Insufficient documentation

## 2012-06-08 ENCOUNTER — Encounter: Payer: Self-pay | Admitting: Physician Assistant

## 2012-06-08 ENCOUNTER — Telehealth: Payer: Self-pay | Admitting: Oncology

## 2012-06-08 ENCOUNTER — Ambulatory Visit (HOSPITAL_BASED_OUTPATIENT_CLINIC_OR_DEPARTMENT_OTHER): Payer: Medicare Other | Admitting: Physician Assistant

## 2012-06-08 VITALS — BP 129/77 | HR 82 | Temp 97.6°F | Resp 20 | Ht 64.0 in | Wt 227.2 lb

## 2012-06-08 DIAGNOSIS — M2550 Pain in unspecified joint: Secondary | ICD-10-CM | POA: Diagnosis not present

## 2012-06-08 DIAGNOSIS — IMO0001 Reserved for inherently not codable concepts without codable children: Secondary | ICD-10-CM | POA: Diagnosis not present

## 2012-06-08 DIAGNOSIS — Z170 Estrogen receptor positive status [ER+]: Secondary | ICD-10-CM

## 2012-06-08 DIAGNOSIS — C50912 Malignant neoplasm of unspecified site of left female breast: Secondary | ICD-10-CM

## 2012-06-08 DIAGNOSIS — D0590 Unspecified type of carcinoma in situ of unspecified breast: Secondary | ICD-10-CM | POA: Diagnosis not present

## 2012-06-08 DIAGNOSIS — Z853 Personal history of malignant neoplasm of breast: Secondary | ICD-10-CM

## 2012-06-08 DIAGNOSIS — Z780 Asymptomatic menopausal state: Secondary | ICD-10-CM

## 2012-06-08 NOTE — Telephone Encounter (Signed)
gv pt appt schedule for April and appts for mammo/bone density 12/15.

## 2012-06-08 NOTE — Progress Notes (Signed)
ID: [name]   DOB: [birth year]  MR#: [id number]  WUJ#:[id number]   PCP:  Astrid Divine, MD GYN:  Olivia Mackie MD SUCyndia Bent, MD OTHER:    HISTORY OF PRESENT ILLNESS: The patient had screening mammography in February 2012 which showed some calcifications in the left breast.  She was brought back for additional views in March, and this showed a few minimally heterogeneous and punctate calcifications in the upper outer portion of the left breast spanning up to 2 cm.  There was no associated mass or distortion.  After appropriate discussion, it was felt that repeat mammography in 6 months would be appropriate, and the patient returned October 17th for diagnostic left mammography.  At this time, the calcifications appeared to be increasing and were more linear.  Again, there was no evidence of an associated mass or distortion.  A second smaller cluster of punctate calcifications in the upper outer left breast was unchanged.  The patient does have heterogeneously dense breast tissue which, of course, decreases mammographic sensitivity.   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  With this information, the patient was referred to Dr. Jamey Ripa, and after undergoing right diagnostic mammography October 25th, which was unremarkable, had bilateral breast MRIs on October 30th.  This showed an area of clumped and linear enhancement along the biopsy tract measuring 1.2 cm.  There was no suspicious mass or abnormal enhancement and no internal mammary or axillary adenopathy.  Bilateral thyroid masses were incidentally imaged.   She underwent Left lumpectomy 77/14/2012 for a 0.6 cm DCIS, low grade, with ample margins and started letrozole December 2012  INTERVAL  HISTORY: [name] returns today accompanied by her daughter for routine 79-year followup  Of her left breast carcinoma.  [name] started letrozole in December 79 When asked what her biggest complaint is today she tells me "arthritis". Upon further questioning, she has had increased problems with arthritis in the knees, hands, and wrists bilaterally sent last spring. This does seem to have worsened over the past year, and it did coincide with being on the letrozole for approximately 3 months.  REVIEW OF SYSTEMS: Otherwise,  [name] has occasional hot flashes although she does not find them to be particularly problematic.she's had no vaginal dryness, and also denies any abnormal vaginal bleeding. She has some mild urinary incontinence. She's had no fevers or chills. She denies any rashes or skin changes and has had no abnormal bleeding. She's eating and drinking well denies any nausea or change in bowel or bladder habits. She's had no cough,  Increased shortness of breath, chest pain, or palpitations. She does have some shortness of breath with exertion which she attributes to deconditioning. She also denies any abnormal headaches or dizziness. She's had no peripheral swelling.  A detailed review of systems is otherwise noncontributory.   PAST MEDICAL HISTORY: Past Medical History  Diagnosis Date  . 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 Hypertension   . Thyroid nodule   . GERD (gastroesophageal reflux disease)   . Arthritis     knees  . Normal cardiac stress test 2000    done at [facility]    PAST SURGICAL HISTORY: Past Surgical History  Procedure Laterality Date  . Tonsillectomy  1944  . Adenoidectomy    . Breast biopsy  1974    right  . Breast biopsy  1977    left  . Breast lumpectomy w/ needle localization  01/22/2011    Left - Dr Jamey Ripa  . Breast lumpectomy  01/22/2011    Procedure: LUMPECTOMY;  Surgeon: [name], MD;  Location: [facility] OR;  Service: General;  Laterality: Left;   Needle localization  @ [facility]  12:30  . Abdominal hysterectomy      partial  . Colonoscopy  2009    FAMILY HISTORY Family History  Problem Relation Age of Onset  . Cancer Paternal Aunt   . Cancer Paternal Uncle 77    colon  . Cancer Maternal Grandmother 12    breast  The patient's father died at the age of 34.  The patient's mother died at the age of 68 following a stroke.  The patient was an only child  The patient's mother's mother was diagnosed with breast cancer at the age of 47.  The only other cancer that she is aware of in the family is colon cancer in a paternal uncle.  There is no history of ovarian cancer in the family.  GYNECOLOGIC HISTORY: She had menarche at age 42.  Last period at the time of her hysterectomy in 1986.  No BSO. She used hormone replacement for approximately 4 years, stopping in 1990.  She is GX P1, used birth control pills for approximately 1 year remotely.  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  Her daughter, [name], teaches math here in [place], although she is currently semiretired because of disability.  The patient lives with [name] and [name]'s 3 grandchildren, ages 72 and 90 year old twins.   ADVANCED DIRECTIVES:  HEALTH MAINTENANCE: History  Substance Use Topics  . Smoking status: Former Smoker    Quit date: 03/10/1981  . Smokeless tobacco: Never Used  . Alcohol Use: No     Colonoscopy: 2009 (Buccini)  PAP: s/p hysterectomy  Bone density: [facility] 2010 "OK"  Lipid panel:  Allergies  Allergen Reactions  . Penicillins Hives  . Tape Rash    Current Outpatient Prescriptions  Medication Sig Dispense Refill  . B Complex-C (SUPER B COMPLEX PO) Take 1 tablet by mouth daily.        . benazepril (LOTENSIN) 10 MG tablet Take 5 mg by mouth daily.       [name] Bioflavonoid Products (GRAPE SEED PO) Take 1 tablet by mouth daily. Grape seed 50mg  &  magnesium 15mg  OTC       . Cholecalciferol (VITAMIN D-3 PO) Take 2,000 Units by mouth daily.       . Coenzyme Q10 (COQ-10 PO) Take 1 capsule by mouth daily.        [name] CRANBERRY PO Take 1 capsule by mouth daily.        [name] ezetimibe-simvastatin (VYTORIN) 10-40 MG per tablet Take 1 tablet by mouth daily.       . Garlic 1000 MG CAPS Take 1,000 mg by mouth daily.        . Ginger, Zingiber officinalis, (GINGER PO) Take 550 mg by mouth daily.      Chilton Si Tea, Camillia sinensis, 315 MG CAPS Take 315 mg by mouth daily. otc       . letrozole (FEMARA) 2.5 MG tablet Take 1 tablet (2.5 mg total) by mouth daily.  90 tablet  3  . Multiple Vitamins-Minerals (MULTIVITAMIN WITH MINERALS)  tablet Take by mouth daily.        [name] omega-3 acid ethyl esters (LOVAZA) 1 G capsule Take 1 g by mouth daily.        [name] omeprazole (PRILOSEC) 10 MG capsule Take 10 mg by mouth daily.        . travoprost, benzalkonium, (TRAVATAN) 0.004 % ophthalmic solution Place 1 drop into both eyes daily.        [name] UNABLE TO FIND 500 mg daily. tumeric      . vitamin C (ASCORBIC ACID) 500 MG tablet Take 1,000 mg by mouth daily.         No current facility-administered medications for this visit.    OBJECTIVE: Elderly woman who appears comfortable Filed Vitals:   06/08/12 1342  BP: 129/77  Pulse: 82  Temp: 97.6 F (36.4 C)  Resp: 20     Body mass index is 38.98 kg/(m^2).    ECOG FS:1 Filed Weights   06/08/12 1342  Weight: 227 lb 3.2 oz (103.057 kg)    Sclerae unicteric Oropharynx clear No cervical or supraclavicular adenopathy Lungs clear to auscultation bilaterally, no rales or rhonchi Heart regular rate and rhythm Abdomen  obese, soft, nontender to palpation, positive bowel sounds  MSK  no focal spinal tenderness, nonpitting pedal edema bilaterally, equal bilaterally. No upper extremity edema is noted. Neuro: nonfocal, well oriented with positive affect Breasts: Right breast is unremarkable; left breast status post lumpectomy;  no skin changes or palpable masses; no evidence of local recurrence. Axillae are benign bilaterally with no palpable adenopathy.  LAB RESULTS:    Lab Results  Component Value Date   WBC 6.8 01/15/2011   NEUTROABS 3.2 01/08/2011   HGB 13.2 01/15/2011   HCT 40.8 01/15/2011   MCV 89.9 01/15/2011   PLT 206 01/15/2011      Chemistry      Component Value Date/Time   NA 139 01/22/2011 1431   K 4.1 01/22/2011 1431   CL 103 01/22/2011 1431   CO2 26 01/22/2011 1431   BUN 16 01/22/2011 1431   CREATININE 0.92 01/22/2011 1431      Component Value Date/Time   CALCIUM 10.4 01/22/2011 1431   ALKPHOS 76 01/08/2011 1248   AST 22 01/08/2011 1248   ALT 18 01/08/2011 1248   BILITOT 0.3 01/08/2011 1248      STUDIES:  Most recent bilateral mammogram on 02/20/2012 was unremarkable. Per patient report, her most recent bone density was "normal" although I do not have a date for the study, or a copy of the study itself.    ASSESSMENT: 79 year old [place] woman, status post    (1)  Left lumpectomy NOV 2012 for a low-grade ductal carcinoma in situ, with negative and ample margins, strongly estrogen and progesterone receptor positive.  (2) on letrozole since December 2012, the plan being to complete a total of 5 years.   PLAN:  Illianna and I discussed the possibility of holding her letrozole for 3-4 weeks to see if the myalgias and arthralgias improved. She is very much in favor of this plan, telling me that the pain is affecting her quality of life. Accordingly, she'll discontinue the letrozole effective today, and I will see her back on April 30 for followup and reevaluation. If her pain has not improved, we will restart the letrozole. It has improved, we will possibly try another medication such as exemestane.  Otherwise, she'll be due for her next mammogram and bone density in December 2014, and will be seeing  Dr. Darnelle Catalan next year in approximately March or April of 2015.  Seth and her  daughter both voice understanding and agreement with this plan and will call with any changes or problems.    Abhinav Mayorquin    06/08/2012

## 2012-07-07 ENCOUNTER — Encounter: Payer: Self-pay | Admitting: Physician Assistant

## 2012-07-07 ENCOUNTER — Ambulatory Visit (HOSPITAL_BASED_OUTPATIENT_CLINIC_OR_DEPARTMENT_OTHER): Payer: Medicare Other | Admitting: Physician Assistant

## 2012-07-07 VITALS — BP 116/69 | HR 78 | Temp 97.7°F | Resp 20 | Ht 64.0 in | Wt 226.1 lb

## 2012-07-07 DIAGNOSIS — C50919 Malignant neoplasm of unspecified site of unspecified female breast: Secondary | ICD-10-CM

## 2012-07-07 DIAGNOSIS — C50912 Malignant neoplasm of unspecified site of left female breast: Secondary | ICD-10-CM

## 2012-07-07 MED ORDER — EXEMESTANE 25 MG PO TABS: 25.0000 mg | ORAL_TABLET | Freq: Every day | ORAL | Status: DC

## 2012-07-07 NOTE — Progress Notes (Signed)
ID: [name]   DOB: Aug 01, 1931  MR#: [id number]  GNF#:[id number]   PCP:  Astrid Divine, MD GYN:  Olivia Mackie MD SUCyndia Bent, MD OTHER:    HISTORY OF PRESENT ILLNESS: The patient had screening mammography in February 2012 which showed some calcifications in the left breast.  She was brought back for additional views in March, and this showed a few minimally heterogeneous and punctate calcifications in the upper outer portion of the left breast spanning up to 2 cm.  There was no associated mass or distortion.  After appropriate discussion, it was felt that repeat mammography in 6 months would be appropriate, and the patient returned October 17th for diagnostic left mammography.  At this time, the calcifications appeared to be increasing and were more linear.  Again, there was no evidence of an associated mass or distortion.  A second smaller cluster of punctate calcifications in the upper outer left breast was unchanged.  The patient does have heterogeneously dense breast tissue which, of course, decreases mammographic sensitivity.   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  With this information, the patient was referred to Dr. Jamey Ripa, and after undergoing right diagnostic mammography October 25th, which was unremarkable, had bilateral breast MRIs on October 30th.  This showed an area of clumped and linear enhancement along the biopsy tract measuring 1.2 cm.  There was no suspicious mass or abnormal enhancement and no internal mammary or axillary adenopathy.  Bilateral thyroid masses were incidentally imaged.   She underwent Left lumpectomy 01/22/2011 for a 0.6 cm DCIS, low grade, with ample margins and started letrozole December 2012  INTERVAL  HISTORY: [name] returns today for followup of her left breast carcinoma. Interval history is remarkable for [facility] having discontinued letrozole after seeing me on 06/08/2012. She was having quite a bit of joint pain, especially affecting the hands and wrists bilaterally.  Since discontinuing the letrozole, Mirren tells me the pain has improved "by 90%". Her hands and wrists are definitely feeling better. She still has some arthritis in her knees, but overall she feels like she has had significant improvement.    REVIEW OF SYSTEMS: Otherwise,  [name] continues to have occasional hot flashes, even off of the aromatase inhibitor. She denies any problems with vaginal dryness, and has had no abnormal vaginal bleeding. Her energy level is fairly good, and overall she is feeling well. She's had some nasal congestion she attributes to seasonal allergies. She denies any cough, increased shortness of breath, or chest pain.  A detailed review of systems is otherwise stable and noncontributory.    PAST MEDICAL HISTORY: Past Medical History  Diagnosis Date  . 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 Hypertension   . Thyroid nodule   . GERD (gastroesophageal reflux disease)   . Arthritis     knees  . Normal cardiac stress test 2000    done at [facility]    PAST SURGICAL HISTORY: Past Surgical History  Procedure Laterality Date  . Tonsillectomy  1944  . Adenoidectomy    . Breast biopsy  1974    right  . Breast biopsy  1977    left  . Breast lumpectomy w/ needle localization  01/22/2011    Left - Dr Jamey Ripa  . Breast lumpectomy  01/22/2011  Procedure: LUMPECTOMY;  Surgeon: [name], MD;  Location: [facility] OR;  Service: General;  Laterality: Left;  Needle localization  @ [facility]  12:30  . Abdominal hysterectomy      partial  . Colonoscopy  2009    FAMILY HISTORY Family History  Problem Relation Age of Onset  . Cancer Paternal Aunt   . Cancer Paternal Uncle 45    colon  . Cancer Maternal  Grandmother 16    breast  The patient's father died at the age of 58. (He was one of 11 children.) The patient's mother died at the age of 75 following a stroke.  The patient was an only child.  The patient's mother's mother was diagnosed with breast cancer at the age of 98.  The only other cancer that she is aware of in the family is colon cancer in a paternal uncle.  There is no history of ovarian cancer in the family.  GYNECOLOGIC HISTORY: She had menarche at age 9.  Last period at the time of her hysterectomy in 1986.  No BSO. She used hormone replacement for approximately 4 years, stopping in 1990.  She is GX P1, used birth control pills for approximately 1 year remotely.  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  Her daughter, [name], teaches math here in [place], although she is currently semiretired because of disability.  The patient lives with [name] and [name]'s 3 grandchildren, ages 85 and 3 year old twins.   ADVANCED DIRECTIVES:  HEALTH MAINTENANCE: History  Substance Use Topics  . Smoking status: Former Smoker    Quit date: 03/10/1981  . Smokeless tobacco: Never Used  . Alcohol Use: No     Colonoscopy: 2009 (Buccini)  PAP: s/p hysterectomy  Bone density: [facility] 2010 "OK"  Lipid panel:  UTD, Dr. [name]  Allergies  Allergen Reactions  . Penicillins Hives  . Tape Rash    Current Outpatient Prescriptions  Medication Sig Dispense Refill  . B Complex-C (SUPER B COMPLEX PO) Take 1 tablet by mouth daily.        . benazepril (LOTENSIN) 10 MG tablet Take 5 mg by mouth daily.       [name] Bioflavonoid Products (GRAPE SEED PO) Take 1 tablet by mouth daily. Grape seed 50mg  & magnesium 15mg  OTC       . Cholecalciferol (VITAMIN D-3 PO) Take 2,000 Units by mouth daily.       . Coenzyme Q10 (COQ-10 PO) Take 100 mg by mouth daily.       [name] CRANBERRY PO Take 1 capsule by mouth  daily.        [name] ezetimibe-simvastatin (VYTORIN) 10-20 MG per tablet Take 1 tablet by mouth at bedtime.      . Garlic 1000 MG CAPS Take 1,000 mg by mouth daily.        . Ginger, Zingiber officinalis, (GINGER PO) Take 550 mg by mouth daily.      Chilton Si Tea, Camillia sinensis, 315 MG CAPS Take 315 mg by mouth daily. otc       . Multiple Vitamins-Minerals (MULTIVITAMIN WITH MINERALS) tablet Take by mouth daily.        [name] omega-3 acid ethyl esters (LOVAZA) 1 G capsule Take 1 g by mouth daily.        [name] omeprazole (PRILOSEC) 10 MG capsule Take 10 mg by mouth daily.        [name]  travoprost, benzalkonium, (TRAVATAN) 0.004 % ophthalmic solution Place 1 drop into both eyes daily. 5% solution per patient      . UNABLE TO FIND 500 mg daily. TUMERIC      . vitamin C (ASCORBIC ACID) 500 MG tablet Take 1,000 mg by mouth daily.        [name] exemestane (AROMASIN) 25 MG tablet Take 1 tablet (25 mg total) by mouth daily.  90 tablet  3   No current facility-administered medications for this visit.    OBJECTIVE: Elderly woman who appears comfortable and is in no acute distress Filed Vitals:   07/07/12 1518  BP: 116/69  Pulse: 78  Temp: 97.7 F (36.5 C)  Resp: 20     Body mass index is 38.79 kg/(m^2).    ECOG FS:1 Filed Weights   07/07/12 1518  Weight: 226 lb 1.6 oz (102.558 kg)   Remainder of physical exam was deferred today.    LAB RESULTS:  No labs were obtained today, 07/07/2012.  Lab Results  Component Value Date   WBC 6.8 01/15/2011   NEUTROABS 3.2 01/08/2011   HGB 13.2 01/15/2011   HCT 40.8 01/15/2011   MCV 89.9 01/15/2011   PLT 206 01/15/2011      Chemistry      Component Value Date/Time   NA 139 01/22/2011 1431   K 4.1 01/22/2011 1431   CL 103 01/22/2011 1431   CO2 26 01/22/2011 1431   BUN 16 01/22/2011 1431   CREATININE 0.92 01/22/2011 1431      Component Value Date/Time   CALCIUM 10.4 01/22/2011 1431   ALKPHOS 76 01/08/2011 1248   AST 22 01/08/2011 1248   ALT 18 01/08/2011 1248    BILITOT 0.3 01/08/2011 1248      STUDIES:  Most recent bilateral mammogram on 02/20/2012 was unremarkable. Per patient report, her most recent bone density was "normal" although I do not have a date for the study, or a copy of the study itself.    ASSESSMENT: 79 year old [place] woman, status post    (1)  Left lumpectomy NOV 2012 for a low-grade ductal carcinoma in situ, with negative and ample margins, strongly estrogen and progesterone receptor positive.  (2) on letrozole since December 2012, discontinued on 06/08/2012 secondary to increased joint pain  (3)  Starting exemestane, 07/07/2012.  PLAN:  This case has been reviewed with Dr. Darnelle Catalan. Walaa is willing to try a different aromatase inhibitor, and we are going to prescribe exemestane, 25 mg daily. The plan will be to continue antiestrogen therapy, until December 2017.  I plan on seeing her back in approximately 3 months to assess tolerance, but she can call us prior that time she's having any changes or problems, specifically any increased joint pain.  Evola is already scheduled for a repeat bone density and mammogram in mid December. All this was reviewed with her today and she voices understanding and agreement with this plan.  And [facility]    07/07/2012

## 2012-07-27 DIAGNOSIS — M1710 Unilateral primary osteoarthritis, unspecified knee: Secondary | ICD-10-CM | POA: Diagnosis not present

## 2012-08-04 DIAGNOSIS — M654 Radial styloid tenosynovitis [de Quervain]: Secondary | ICD-10-CM | POA: Diagnosis not present

## 2012-08-05 DIAGNOSIS — M654 Radial styloid tenosynovitis [de Quervain]: Secondary | ICD-10-CM | POA: Diagnosis not present

## 2012-08-12 DIAGNOSIS — Z0000 Encounter for general adult medical examination without abnormal findings: Secondary | ICD-10-CM | POA: Diagnosis not present

## 2012-08-12 DIAGNOSIS — E7800 Pure hypercholesterolemia, unspecified: Secondary | ICD-10-CM | POA: Diagnosis not present

## 2012-08-12 DIAGNOSIS — E049 Nontoxic goiter, unspecified: Secondary | ICD-10-CM | POA: Diagnosis not present

## 2012-08-12 DIAGNOSIS — R32 Unspecified urinary incontinence: Secondary | ICD-10-CM | POA: Diagnosis not present

## 2012-08-12 DIAGNOSIS — D0590 Unspecified type of carcinoma in situ of unspecified breast: Secondary | ICD-10-CM | POA: Diagnosis not present

## 2012-08-12 DIAGNOSIS — E119 Type 2 diabetes mellitus without complications: Secondary | ICD-10-CM | POA: Diagnosis not present

## 2012-08-12 DIAGNOSIS — I10 Essential (primary) hypertension: Secondary | ICD-10-CM | POA: Diagnosis not present

## 2012-08-12 DIAGNOSIS — K219 Gastro-esophageal reflux disease without esophagitis: Secondary | ICD-10-CM | POA: Diagnosis not present

## 2012-08-20 ENCOUNTER — Other Ambulatory Visit: Payer: Self-pay | Admitting: Gastroenterology

## 2012-08-20 DIAGNOSIS — K5730 Diverticulosis of large intestine without perforation or abscess without bleeding: Secondary | ICD-10-CM | POA: Diagnosis not present

## 2012-08-20 DIAGNOSIS — K219 Gastro-esophageal reflux disease without esophagitis: Secondary | ICD-10-CM | POA: Diagnosis not present

## 2012-08-20 DIAGNOSIS — Z86010 Personal history of colonic polyps: Secondary | ICD-10-CM | POA: Diagnosis not present

## 2012-08-20 DIAGNOSIS — K620 Anal polyp: Secondary | ICD-10-CM | POA: Diagnosis not present

## 2012-08-20 DIAGNOSIS — D126 Benign neoplasm of colon, unspecified: Secondary | ICD-10-CM | POA: Diagnosis not present

## 2012-08-20 DIAGNOSIS — Z09 Encounter for follow-up examination after completed treatment for conditions other than malignant neoplasm: Secondary | ICD-10-CM | POA: Diagnosis not present

## 2012-08-20 DIAGNOSIS — K621 Rectal polyp: Secondary | ICD-10-CM | POA: Diagnosis not present

## 2012-08-20 DIAGNOSIS — K2270 Barrett's esophagus without dysplasia: Secondary | ICD-10-CM | POA: Diagnosis not present

## 2012-08-23 DIAGNOSIS — M654 Radial styloid tenosynovitis [de Quervain]: Secondary | ICD-10-CM | POA: Diagnosis not present

## 2012-08-23 DIAGNOSIS — M6530 Trigger finger, unspecified finger: Secondary | ICD-10-CM | POA: Diagnosis not present

## 2012-09-06 DIAGNOSIS — N3946 Mixed incontinence: Secondary | ICD-10-CM | POA: Diagnosis not present

## 2012-09-06 DIAGNOSIS — R351 Nocturia: Secondary | ICD-10-CM | POA: Diagnosis not present

## 2012-10-18 DIAGNOSIS — R350 Frequency of micturition: Secondary | ICD-10-CM | POA: Diagnosis not present

## 2012-10-18 DIAGNOSIS — N3941 Urge incontinence: Secondary | ICD-10-CM | POA: Diagnosis not present

## 2012-10-25 DIAGNOSIS — M6530 Trigger finger, unspecified finger: Secondary | ICD-10-CM | POA: Diagnosis not present

## 2012-10-29 ENCOUNTER — Other Ambulatory Visit (HOSPITAL_BASED_OUTPATIENT_CLINIC_OR_DEPARTMENT_OTHER): Payer: Medicare Other | Admitting: Lab

## 2012-10-29 ENCOUNTER — Ambulatory Visit (HOSPITAL_BASED_OUTPATIENT_CLINIC_OR_DEPARTMENT_OTHER): Payer: Medicare Other | Admitting: Physician Assistant

## 2012-10-29 ENCOUNTER — Ambulatory Visit: Payer: Medicare Other

## 2012-10-29 ENCOUNTER — Telehealth: Payer: Self-pay | Admitting: Oncology

## 2012-10-29 ENCOUNTER — Encounter: Payer: Self-pay | Admitting: Physician Assistant

## 2012-10-29 VITALS — BP 135/76 | HR 77 | Temp 97.6°F | Resp 20 | Ht 64.0 in | Wt 221.9 lb

## 2012-10-29 DIAGNOSIS — M25539 Pain in unspecified wrist: Secondary | ICD-10-CM | POA: Diagnosis not present

## 2012-10-29 DIAGNOSIS — M25531 Pain in right wrist: Secondary | ICD-10-CM | POA: Insufficient documentation

## 2012-10-29 DIAGNOSIS — C50912 Malignant neoplasm of unspecified site of left female breast: Secondary | ICD-10-CM

## 2012-10-29 DIAGNOSIS — D0590 Unspecified type of carcinoma in situ of unspecified breast: Secondary | ICD-10-CM | POA: Diagnosis not present

## 2012-10-29 LAB — CBC WITH DIFFERENTIAL/PLATELET
Basophils Absolute: 0 10*3/uL (ref 0.0–0.1)
Eosinophils Absolute: 0.1 10*3/uL (ref 0.0–0.5)
HGB: 13.3 g/dL (ref 11.6–15.9)
MONO#: 0.6 10*3/uL (ref 0.1–0.9)
MONO%: 7.7 % (ref 0.0–14.0)
NEUT#: 5.1 10*3/uL (ref 1.5–6.5)
RBC: 4.58 10*6/uL (ref 3.70–5.45)
RDW: 13.9 % (ref 11.2–14.5)
WBC: 7.3 10*3/uL (ref 3.9–10.3)
lymph#: 1.5 10*3/uL (ref 0.9–3.3)

## 2012-10-29 LAB — COMPREHENSIVE METABOLIC PANEL (CC13)
Albumin: 3.9 g/dL (ref 3.5–5.0)
Alkaline Phosphatase: 82 U/L (ref 40–150)
BUN: 14.8 mg/dL (ref 7.0–26.0)
Calcium: 9.8 mg/dL (ref 8.4–10.4)
Chloride: 105 mEq/L (ref 98–109)
Glucose: 119 mg/dl (ref 70–140)
Potassium: 4.1 mEq/L (ref 3.5–5.1)
Sodium: 140 mEq/L (ref 136–145)
Total Protein: 7.1 g/dL (ref 6.4–8.3)

## 2012-10-29 NOTE — Telephone Encounter (Signed)
, °

## 2012-10-29 NOTE — Progress Notes (Signed)
ID: ASHAKI FROSCH   DOB: 28-Nov-1931  MR#: [id number]  XBM#:[id number]   PCP:  Astrid Divine, MD GYN:  Olivia Mackie MD SUCyndia Bent, MD OTHER:    HISTORY OF PRESENT ILLNESS: The patient had screening mammography in February 2012 which showed some calcifications in the left breast.  She was brought back for additional views in March, and this showed a few minimally heterogeneous and punctate calcifications in the upper outer portion of the left breast spanning up to 2 cm.  There was no associated mass or distortion.  After appropriate discussion, it was felt that repeat mammography in 6 months would be appropriate, and the patient returned October 17th for diagnostic left mammography.  At this time, the calcifications appeared to be increasing and were more linear.  Again, there was no evidence of an associated mass or distortion.  A second smaller cluster of punctate calcifications in the upper outer left breast was unchanged.  The patient does have heterogeneously dense breast tissue which, of course, decreases mammographic sensitivity.   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  With this information, the patient was referred to Dr. Jamey Ripa, and after undergoing right diagnostic mammography October 25th, which was unremarkable, had bilateral breast MRIs on October 30th.  This showed an area of clumped and linear enhancement along the biopsy tract measuring 1.2 cm.  There was no suspicious mass or abnormal enhancement and no internal mammary or axillary adenopathy.  Bilateral thyroid masses were incidentally imaged.   She underwent Left lumpectomy 01/22/2011 for a 0.6 cm DCIS, low grade, with ample margins and started letrozole December 2012  INTERVAL  HISTORY: Avannah returns today for followup of her left breast carcinoma. As a brief recap, Woodie discontinued the letrozole in early April 2014 at which time she had begun having increased pain in the hands and wrists bilaterally. Although the pain did not entirely resolved, and had improved by the end of April. After much discussion, she decided to try a different aromatase inhibitor, and was prescribed exemestane on 07/07/2012. She took a prescription to the pharmacy, but never had it filled. She's now been off of the aromatase inhibitors for almost 5 months. She continues to have pain in the hands and wrists bilaterally for which she is followed by Dr. Amanda Pea.  She has been diagnosed with arthritis and tendinitis, and recently received bilateral cortisone injections with some relief.  She is here today to discuss her treatment plan.  REVIEW OF SYSTEMS: Aixa continues to have occasional hot flashes, even off of the aromatase inhibitor. Of course as noted above, she continues to have quite a bit of pain in her hands and wrists, even off the aromatase inhibitors as well. She does have a history of arthritis as well, especially in the knees. This has not changed. She's had no recent illnesses and denies fevers or chills. She's had no rashes or abnormal bleeding. Her energy level is good as is her appetite, and she denies any nausea or change in bowel or bladder habits. She's had no cough, increased shortness of breath, or chest pain. She denies any headaches, dizziness, or change in vision.  A detailed review of systems is otherwise stable and noncontributory.  PAST MEDICAL HISTORY: Past Medical History  Diagnosis Date  . Breast cancer,  Left, DCIS 01/01/2011  . Hypertension   . Thyroid nodule   . GERD (gastroesophageal reflux disease)   . Arthritis     knees  . Normal cardiac stress test 2000    done at [facility]    PAST SURGICAL HISTORY: Past Surgical History  Procedure Laterality Date  .  Tonsillectomy  1944  . Adenoidectomy    . Breast biopsy  1974    right  . Breast biopsy  1977    left  . Breast lumpectomy w/ needle localization  01/22/2011    Left - Dr Jamey Ripa  . Breast lumpectomy  01/22/2011    Procedure: LUMPECTOMY;  Surgeon: [name], MD;  Location: [facility] OR;  Service: General;  Laterality: Left;  Needle localization  @ [facility]  12:30  . Abdominal hysterectomy      partial  . Colonoscopy  2009    FAMILY HISTORY Family History  Problem Relation Age of Onset  . Cancer Paternal Aunt   . Cancer Paternal Uncle 12    colon  . Cancer Maternal Grandmother 48    breast  The patient's father died at the age of 53. (He was one of 11 children.) The patient's mother died at the age of 45 following a stroke.  The patient was an only child.  The patient's mother's mother was diagnosed with breast cancer at the age of 43.  The only other cancer that she is aware of in the family is colon cancer in a paternal uncle.  There is no history of ovarian cancer in the family.  GYNECOLOGIC HISTORY: She had menarche at age 53.  Last period at the time of her hysterectomy in 1986.  No BSO. She used hormone replacement for approximately 4 years, stopping in 1990.  She is GX P1, used birth control pills for approximately 1 year remotely.  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  Her daughter, [name], teaches math here in [place], although she is currently semiretired because of disability.  The patient lives with [name] and [name]'s 3 grandchildren, ages 39 and 17 year old twins.   ADVANCED DIRECTIVES:  HEALTH MAINTENANCE: History  Substance Use Topics  . Smoking status: Former Smoker    Quit date: 03/10/1981  . Smokeless tobacco: Never Used  . Alcohol Use: No     Colonoscopy: 2009 (Buccini)  PAP: s/p hysterectomy  Bone density: [facility] 2010 "OK"  Lipid panel:  UTD, Dr.  [name]  Allergies  Allergen Reactions  . Penicillins Hives  . Tape Rash    Current Outpatient Prescriptions  Medication Sig Dispense Refill  . B Complex-C (SUPER B COMPLEX PO) Take 1 tablet by mouth daily.        . benazepril (LOTENSIN) 10 MG tablet Take 5 mg by mouth daily.       [name] Bioflavonoid Products (GRAPE SEED PO) Take 1 tablet by mouth daily. Grape seed 50mg  & magnesium 15mg  OTC       . Cholecalciferol (VITAMIN D-3 PO) Take 2,000 Units by mouth daily.       . Coenzyme Q10 (COQ-10 PO) Take 100 mg by mouth daily.       [name] CRANBERRY PO Take 1 capsule by mouth daily.        [name] ezetimibe-simvastatin (VYTORIN) 10-20 MG per tablet Take 1 tablet by mouth at bedtime.      . Garlic 1000 MG CAPS Take 1,000  mg by mouth daily.        . Ginger, Zingiber officinalis, (GINGER PO) Take 550 mg by mouth daily.      Chilton Si Tea, Camillia sinensis, 315 MG CAPS Take 315 mg by mouth daily. otc       . mirabegron ER (MYRBETRIQ) 25 MG TB24 tablet Take 25 mg by mouth daily.      . Multiple Vitamins-Minerals (MULTIVITAMIN WITH MINERALS) tablet Take by mouth daily.        [name] omega-3 acid ethyl esters (LOVAZA) 1 G capsule Take 1 g by mouth daily.        [name] omeprazole (PRILOSEC) 10 MG capsule Take 10 mg by mouth daily.        . travoprost, benzalkonium, (TRAVATAN) 0.004 % ophthalmic solution Place 1 drop into both eyes daily. 5% solution per patient      . UNABLE TO FIND 500 mg daily. TUMERIC      . vitamin C (ASCORBIC ACID) 500 MG tablet Take 1,000 mg by mouth daily.        [name] exemestane (AROMASIN) 25 MG tablet Take 1 tablet (25 mg total) by mouth daily.  90 tablet  3  . GAVILYTE-N WITH FLAVOR PACK 420 G solution       . metFORMIN (GLUCOPHAGE-XR) 500 MG 24 hr tablet       . VESICARE 5 MG tablet        No current facility-administered medications for this visit.    OBJECTIVE: Elderly woman who appears comfortable and is in no acute distress Filed Vitals:   10/29/12 1531  BP: 135/76  Pulse: 77  Temp:  97.6 F (36.4 C)  Resp: 20     Body mass index is 38.07 kg/(m^2).    ECOG FS:1 Filed Weights   10/29/12 1531  Weight: 221 lb 14.4 oz (100.653 kg)   Physical Exam: HEENT:  Sclerae anicteric.  Oropharynx clear.  Nodes:  No cervical or supraclavicular lymphadenopathy palpated.  Breast Exam:  Right breast is unremarkable. Left breast is status post lumpectomy, no specialist nodularities or evidence of local recurrence. Axillae are benign bilaterally with no palpable adenopathy Lungs:  Clear to auscultation bilaterally. No crackles or wheezes  Heart:  Regular rate and rhythm.   Abdomen:  Soft, obese, nontender.  Positive bowel sounds.  Musculoskeletal:  No focal spinal tenderness to palpation.  Extremities:  No peripheral edema   Neuro:  Nonfocal. Well oriented with positive affect      LAB RESULTS:  Lab Results  Component Value Date   WBC 7.3 10/29/2012   NEUTROABS 5.1 10/29/2012   HGB 13.3 10/29/2012   HCT 41.1 10/29/2012   MCV 89.7 10/29/2012   PLT 224 10/29/2012      Chemistry      Component Value Date/Time   NA 140 10/29/2012 1521   NA 139 01/22/2011 1431   K 4.1 10/29/2012 1521   K 4.1 01/22/2011 1431   CL 103 01/22/2011 1431   CO2 24 10/29/2012 1521   CO2 26 01/22/2011 1431   BUN 14.8 10/29/2012 1521   BUN 16 01/22/2011 1431   CREATININE 0.9 10/29/2012 1521   CREATININE 0.92 01/22/2011 1431      Component Value Date/Time   CALCIUM 9.8 10/29/2012 1521   CALCIUM 10.4 01/22/2011 1431   ALKPHOS 82 10/29/2012 1521   ALKPHOS 76 01/08/2011 1248   AST 19 10/29/2012 1521   AST 22 01/08/2011 1248   ALT 16 10/29/2012 1521   ALT 18  01/08/2011 1248   BILITOT 0.55 10/29/2012 1521   BILITOT 0.3 01/08/2011 1248      STUDIES:  Most recent bilateral mammogram on 02/20/2012 was unremarkable. Per patient report, her most recent bone density was "normal" although I do not have a date for the study, or a copy of the study itself.    ASSESSMENT: 79 year old [place] woman, status  post    (1)  Left lumpectomy NOV 2012 for a low-grade ductal carcinoma in situ, with negative and ample margins, strongly estrogen and progesterone receptor positive.  (2) on letrozole since December 2012, discontinued on 06/08/2012 secondary to increased joint pain  (3)  the plan was to start exemestane on 07/07/2012, but the patient has not yet started the medication as of today.  PLAN:  Over half of our 40 minute appointment today was spent reviewing Lorel's concerns about the aromatase inhibitors, discussing treatment options, and coordinating care. While it is true that the aromatase inhibitors can certainly cause joint pain, the joint pain should improve once the medication is discontinued. I have never known for this to cause a chronic, damaging, or inflammatory process in the joints. Jayleah has been off of the letrozole now for almost 5 months, and continues to have quite a bit of joint pain in the hands and wrists bilaterally. Although the letrozole could possibly have exacerbated what's going on in the hands and wrists when she was on the medication, I do not believe this pain is going to be associated directly with the aromatase inhibitors at this point.   Genette and I spent quite a bit of time discussing this today. I still feel like it would be prudent to try the exemestane, but I have asked her to discontinue the drug immediately and give [place] a call if she feels like the hand pain or wrist pain begins to increase.  At that point, with her history of low-grade DCIS, we may need to consider simply following with observation, and this can be discussed further at that point. There is also very interested to know what her actual estrogen level is to "prove why she needs this drug", and we spent some time discussing this. Per her request, I am going to obtain an estradiol level today, although she is certainly postmenopausal.  Otherwise, I will plan on seeing Zoelle back in approximately 3-4 months. She  scheduled for her mammogram and bone density in mid December. She'll call prior to his appointment with any changes or problems.   Tawna Alwin PA,C  10/29/2012

## 2012-11-04 ENCOUNTER — Ambulatory Visit: Payer: Medicare Other | Admitting: Physician Assistant

## 2012-11-04 ENCOUNTER — Other Ambulatory Visit: Payer: Medicare Other | Admitting: Lab

## 2012-11-22 DIAGNOSIS — M6530 Trigger finger, unspecified finger: Secondary | ICD-10-CM | POA: Diagnosis not present

## 2012-12-03 DIAGNOSIS — H4011X0 Primary open-angle glaucoma, stage unspecified: Secondary | ICD-10-CM | POA: Diagnosis not present

## 2012-12-03 DIAGNOSIS — H409 Unspecified glaucoma: Secondary | ICD-10-CM | POA: Diagnosis not present

## 2012-12-08 DIAGNOSIS — Z23 Encounter for immunization: Secondary | ICD-10-CM | POA: Diagnosis not present

## 2012-12-11 ENCOUNTER — Encounter (HOSPITAL_COMMUNITY): Payer: Self-pay | Admitting: Emergency Medicine

## 2012-12-11 ENCOUNTER — Emergency Department (HOSPITAL_COMMUNITY)
Admission: EM | Admit: 2012-12-11 | Discharge: 2012-12-11 | Disposition: A | Discharge status: Home / Self Care | Payer: Medicare Other | Attending: Emergency Medicine | Admitting: Emergency Medicine

## 2012-12-11 DIAGNOSIS — T783XXA Angioneurotic edema, initial encounter: Secondary | ICD-10-CM

## 2012-12-11 DIAGNOSIS — Z862 Personal history of diseases of the blood and blood-forming organs and certain disorders involving the immune mechanism: Secondary | ICD-10-CM | POA: Insufficient documentation

## 2012-12-11 DIAGNOSIS — I10 Essential (primary) hypertension: Secondary | ICD-10-CM | POA: Insufficient documentation

## 2012-12-11 DIAGNOSIS — Z8639 Personal history of other endocrine, nutritional and metabolic disease: Secondary | ICD-10-CM | POA: Insufficient documentation

## 2012-12-11 DIAGNOSIS — Z8739 Personal history of other diseases of the musculoskeletal system and connective tissue: Secondary | ICD-10-CM | POA: Diagnosis not present

## 2012-12-11 DIAGNOSIS — K219 Gastro-esophageal reflux disease without esophagitis: Secondary | ICD-10-CM | POA: Insufficient documentation

## 2012-12-11 DIAGNOSIS — Z87891 Personal history of nicotine dependence: Secondary | ICD-10-CM | POA: Insufficient documentation

## 2012-12-11 DIAGNOSIS — Z79899 Other long term (current) drug therapy: Secondary | ICD-10-CM | POA: Insufficient documentation

## 2012-12-11 DIAGNOSIS — Z136 Encounter for screening for cardiovascular disorders: Secondary | ICD-10-CM | POA: Diagnosis not present

## 2012-12-11 DIAGNOSIS — Z853 Personal history of malignant neoplasm of breast: Secondary | ICD-10-CM | POA: Insufficient documentation

## 2012-12-11 DIAGNOSIS — T465X5A Adverse effect of other antihypertensive drugs, initial encounter: Secondary | ICD-10-CM | POA: Insufficient documentation

## 2012-12-11 DIAGNOSIS — Z880 Allergy status to penicillin: Secondary | ICD-10-CM | POA: Diagnosis not present

## 2012-12-11 MED ORDER — METHYLPREDNISOLONE SODIUM SUCC 125 MG IJ SOLR
125.0000 mg | Freq: Once | INTRAMUSCULAR | Status: AC
  Administered 2012-12-11: 125 mg via INTRAVENOUS
  Filled 2012-12-11: qty 2

## 2012-12-11 MED ORDER — PREDNISONE 50 MG PO TABS: 50.0000 mg | ORAL_TABLET | Freq: Every day | ORAL | Status: DC

## 2012-12-11 MED ORDER — FAMOTIDINE IN NACL 20-0.9 MG/50ML-% IV SOLN
20.0000 mg | Freq: Once | INTRAVENOUS | Status: AC
  Administered 2012-12-11: 20 mg via INTRAVENOUS
  Filled 2012-12-11: qty 50

## 2012-12-11 MED ORDER — DIPHENHYDRAMINE HCL 50 MG/ML IJ SOLN
12.5000 mg | Freq: Once | INTRAMUSCULAR | Status: AC
  Administered 2012-12-11: 12.5 mg via INTRAVENOUS
  Filled 2012-12-11: qty 1

## 2012-12-11 MED ORDER — LORATADINE 10 MG PO TABS: 10.0000 mg | ORAL_TABLET | Freq: Every day | ORAL | Status: DC

## 2012-12-11 MED ORDER — FAMOTIDINE 20 MG PO TABS: 20.0000 mg | ORAL_TABLET | Freq: Two times a day (BID) | ORAL | Status: DC

## 2012-12-11 MED ORDER — EPINEPHRINE 0.3 MG/0.3ML IJ SOAJ: 0.3000 mg | INTRAMUSCULAR | Status: DC | PRN

## 2012-12-11 NOTE — ED Provider Notes (Signed)
CSN: [id number]     Arrival date & time 12/11/12  1046 History  First MD Initiated Contact with Patient 12/11/12 1110     Chief Complaint  Patient presents with  . Angioedema    HPI Patient presents to the emergency room with complaints of rash and lip swelling. Patient states she noticed the symptoms a little bit yesterday. She had some swelling of her lower lip associated with some itching in her arms and her thigh. The symptoms seem to get a little bit better but then when she woke up this morning her upper lip was swollen. She denies any difficulty breathing. She denies any tongue swelling. She denies any trouble with speaking or swallowing.  She is not aware of any new medications The patient does take an ACE inhibitor. Past Medical History  Diagnosis Date  . 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 Hypertension   . Thyroid nodule   . GERD (gastroesophageal reflux disease)   . Arthritis     knees  . Normal cardiac stress test 2000    done at [facility]   Past Surgical History  Procedure Laterality Date  . Tonsillectomy  1944  . Adenoidectomy    . Breast biopsy  1974    right  . Breast biopsy  1977    left  . Breast lumpectomy w/ needle localization  01/22/2011    Left - Dr Jamey Ripa  . Breast lumpectomy  01/22/2011    Procedure: LUMPECTOMY;  Surgeon: [name], MD;  Location: [facility] OR;  Service: General;  Laterality: Left;  Needle localization  @ [facility]  12:30  . Abdominal hysterectomy      partial  . Colonoscopy  2009   Family History  Problem Relation Age of Onset  . Cancer Paternal Aunt   . Cancer Paternal Uncle 25    colon  . Cancer Maternal Grandmother 36    breast   History  Substance Use Topics  . Smoking status: Former Smoker    Quit date: 03/10/1981  . Smokeless tobacco: Never Used  . Alcohol Use: No   OB History   Grav Para Term Preterm Abortions TAB SAB Ect Mult Living                 Review of Systems  All other systems reviewed and are  negative.    Allergies  Penicillins and Tape  Home Medications   Current Outpatient Rx  Name  Route  Sig  Dispense  Refill  . B Complex-C (SUPER B COMPLEX PO)   Oral   Take 1 tablet by mouth daily.          [name] Bioflavonoid Products (GRAPE SEED PO)   Oral   Take 1 tablet by mouth daily. Grape seed 50mg  & magnesium 15mg  OTC          . Cholecalciferol (VITAMIN D-3 PO)   Oral   Take 2,000 Units by mouth daily.          . Coenzyme Q10 (COQ-10 PO)   Oral   Take 100 mg by mouth daily.          [name] CRANBERRY PO   Oral   Take 1 capsule by mouth daily.           [name] exemestane (AROMASIN) 25 MG tablet   Oral   Take 1 tablet (25 mg total) by mouth daily.   90 tablet   3   . ezetimibe-simvastatin (VYTORIN) 10-20 MG per tablet  Oral   Take 1 tablet by mouth at bedtime.         . Garlic 1000 MG CAPS   Oral   Take 1,000 mg by mouth daily.           . Ginger, Zingiber officinalis, (GINGER PO)   Oral   Take 550 mg by mouth daily.         Chilton Si Tea, Camillia sinensis, 315 MG CAPS   Oral   Take 315 mg by mouth daily. otc          . metFORMIN (GLUCOPHAGE-XR) 500 MG 24 hr tablet   Oral   Take 500 mg by mouth daily with breakfast.          . mirabegron ER (MYRBETRIQ) 25 MG TB24 tablet   Oral   Take 25 mg by mouth daily.         . Multiple Vitamins-Minerals (MULTIVITAMIN WITH MINERALS) tablet   Oral   Take by mouth daily.           [name] omega-3 acid ethyl esters (LOVAZA) 1 G capsule   Oral   Take 1 g by mouth daily.           [name] omeprazole (PRILOSEC) 10 MG capsule   Oral   Take 10 mg by mouth daily.           . travoprost, benzalkonium, (TRAVATAN) 0.004 % ophthalmic solution   Both Eyes   Place 1 drop into both eyes daily. 5% solution per patient         . UNABLE TO FIND      500 mg daily. TUMERIC         . vitamin C (ASCORBIC ACID) 500 MG tablet   Oral   Take 1,000 mg by mouth daily.           [name] EPINEPHrine (EPIPEN) 0.3 mg/0.3  mL SOAJ injection   Intramuscular   Inject 0.3 mLs (0.3 mg total) into the muscle as needed.   1 Device   0   . famotidine (PEPCID) 20 MG tablet   Oral   Take 1 tablet (20 mg total) by mouth 2 (two) times daily.   14 tablet   0   . loratadine (CLARITIN) 10 MG tablet   Oral   Take 1 tablet (10 mg total) by mouth daily.   7 tablet   0   . predniSONE (DELTASONE) 50 MG tablet   Oral   Take 1 tablet (50 mg total) by mouth daily.   5 tablet   0     Dispense as written.    BP 127/59  Pulse 67  Temp(Src) 98 F (36.7 C) (Oral)  Resp 14  SpO2 100% Physical Exam  Nursing note and vitals reviewed. Constitutional: She appears well-developed and well-nourished. No distress.  HENT:  Head: Normocephalic and atraumatic.  Right Ear: External ear normal.  Left Ear: External ear normal.  Mouth/Throat: No oral lesions. No edematous. No oropharyngeal exudate.  Angioedema of upper lip, lower lip not involved, nl tongue  Eyes: Conjunctivae are normal. Right eye exhibits no discharge. Left eye exhibits no discharge. No scleral icterus.  Neck: Neck supple. No tracheal deviation present.  Cardiovascular: Normal rate, regular rhythm and intact distal pulses.   Pulmonary/Chest: Effort normal and breath sounds normal. No stridor. No respiratory distress. She has no wheezes. She has no rales.  Abdominal: Soft. Bowel sounds are normal. She exhibits no distension. There is no tenderness.  There is no rebound and no guarding.  Musculoskeletal: She exhibits no edema and no tenderness.  Neurological: She is alert. She has normal strength. No sensory deficit. Cranial nerve deficit:  no gross defecits noted. She exhibits normal muscle tone. She displays no seizure activity. Coordination normal.  Skin: Skin is warm and dry. No rash noted.  Few areas if erythema upper arms, no definite urticaria  Psychiatric: She has a normal mood and affect.    ED Course  Procedures (including critical care  time) Labs Review Labs Reviewed - No data to display Imaging Review No results found. Medications  diphenhydrAMINE (BENADRYL) injection 12.5 mg (12.5 mg Intravenous Given 12/11/12 1143)  famotidine (PEPCID) IVPB 20 mg (0 mg Intravenous Stopped 12/11/12 1311)  methylPREDNISolone sodium succinate (SOLU-MEDROL) 125 mg/2 mL injection 125 mg (125 mg Intravenous Given 12/11/12 1143)    MDM   1. Angioedema of lips, initial encounter    She presented to the emergency room with angioedema. It is possible her symptoms may be related to her benazepril.  However, the patient did have some other systemic symptoms. Is possible this may have been some alternative allergen. Patient improved with treatment. I will discharge her home with antihistamines and steroids. I recommend she discontinue her benazepril and followup with her doctor to discuss whether resume or change in alternative blood pressure medication    Celene Kras, MD 12/11/12 1433

## 2012-12-11 NOTE — ED Notes (Signed)
She states she noted a pruritic rash at right upper arm this Wed.  Yesterday she noted the itching to persist, and her lower lip was visibly swollen.  Today, her upper lip is swollen and it is beginning to concern her.  She denies any throat swelling, wheezing, or difficulty breathing and is in no distress.  She adds that she is on two new meds; one bing an anti estrogenic and the other being "for my bladder".

## 2012-12-11 NOTE — ED Notes (Signed)
She has just eaten a sandwich.  Her lips are visibly less swollen, and her blotchy rash areas at arms and face are markedly decreased.  She happily tells me she feels "good".

## 2012-12-11 NOTE — ED Notes (Signed)
Pt states that she started having lip swelling yesterday.  States that she is on BP medicine.  Also states that she had a flu shot a few days ago. Denies SOB.

## 2012-12-14 DIAGNOSIS — I10 Essential (primary) hypertension: Secondary | ICD-10-CM | POA: Diagnosis not present

## 2012-12-20 DIAGNOSIS — R350 Frequency of micturition: Secondary | ICD-10-CM | POA: Diagnosis not present

## 2012-12-20 DIAGNOSIS — N3946 Mixed incontinence: Secondary | ICD-10-CM | POA: Diagnosis not present

## 2013-01-25 DIAGNOSIS — E119 Type 2 diabetes mellitus without complications: Secondary | ICD-10-CM | POA: Diagnosis not present

## 2013-01-25 DIAGNOSIS — H43819 Vitreous degeneration, unspecified eye: Secondary | ICD-10-CM | POA: Diagnosis not present

## 2013-01-25 DIAGNOSIS — H4011X0 Primary open-angle glaucoma, stage unspecified: Secondary | ICD-10-CM | POA: Diagnosis not present

## 2013-02-10 ENCOUNTER — Telehealth: Payer: Self-pay | Admitting: *Deleted

## 2013-02-10 NOTE — Telephone Encounter (Signed)
Returned call. Pt was not home. Left message to let her know to keep appointment schedule as is.

## 2013-02-11 ENCOUNTER — Telehealth: Payer: Self-pay | Admitting: *Deleted

## 2013-02-11 DIAGNOSIS — E7800 Pure hypercholesterolemia, unspecified: Secondary | ICD-10-CM | POA: Diagnosis not present

## 2013-02-11 DIAGNOSIS — I10 Essential (primary) hypertension: Secondary | ICD-10-CM | POA: Diagnosis not present

## 2013-02-11 DIAGNOSIS — E119 Type 2 diabetes mellitus without complications: Secondary | ICD-10-CM | POA: Diagnosis not present

## 2013-02-11 DIAGNOSIS — D0590 Unspecified type of carcinoma in situ of unspecified breast: Secondary | ICD-10-CM | POA: Diagnosis not present

## 2013-02-11 NOTE — Telephone Encounter (Signed)
Lm informed the pt that [facility] would like for her to come in earlier on 02/28/13. gv appt for 02/28/13 @10am . Made the pt aware that i will mail a letter/avs...td

## 2013-02-17 ENCOUNTER — Telehealth: Payer: Self-pay | Admitting: *Deleted

## 2013-02-17 NOTE — Telephone Encounter (Signed)
Pt called to cancel her appt for 02/28/13@10  and to rs to 03/01/13@ 4pm...td

## 2013-02-21 ENCOUNTER — Other Ambulatory Visit: Payer: Medicare Other

## 2013-02-21 ENCOUNTER — Ambulatory Visit
Admission: RE | Admit: 2013-02-21 | Discharge: 2013-02-21 | Disposition: A | Discharge status: Home / Self Care | Payer: Medicare Other | Source: Ambulatory Visit | Attending: Physician Assistant | Admitting: Physician Assistant

## 2013-02-21 DIAGNOSIS — R928 Other abnormal and inconclusive findings on diagnostic imaging of breast: Secondary | ICD-10-CM | POA: Diagnosis not present

## 2013-02-21 DIAGNOSIS — C50912 Malignant neoplasm of unspecified site of left female breast: Secondary | ICD-10-CM

## 2013-02-21 DIAGNOSIS — Z853 Personal history of malignant neoplasm of breast: Secondary | ICD-10-CM

## 2013-02-21 DIAGNOSIS — Z780 Asymptomatic menopausal state: Secondary | ICD-10-CM

## 2013-02-24 ENCOUNTER — Telehealth: Payer: Self-pay | Admitting: Oncology

## 2013-02-24 ENCOUNTER — Ambulatory Visit (HOSPITAL_BASED_OUTPATIENT_CLINIC_OR_DEPARTMENT_OTHER): Payer: Medicare Other

## 2013-02-24 DIAGNOSIS — D0590 Unspecified type of carcinoma in situ of unspecified breast: Secondary | ICD-10-CM | POA: Diagnosis not present

## 2013-02-24 DIAGNOSIS — C50912 Malignant neoplasm of unspecified site of left female breast: Secondary | ICD-10-CM

## 2013-02-24 LAB — COMPREHENSIVE METABOLIC PANEL (CC13)
ALT: 14 U/L (ref 0–55)
AST: 21 U/L (ref 5–34)
Albumin: 3.9 g/dL (ref 3.5–5.0)
Anion Gap: 9 mEq/L (ref 3–11)
CO2: 25 mEq/L (ref 22–29)
Calcium: 9.8 mg/dL (ref 8.4–10.4)
Chloride: 105 mEq/L (ref 98–109)
Creatinine: 0.8 mg/dL (ref 0.6–1.1)
Glucose: 140 mg/dl (ref 70–140)
Potassium: 4.1 mEq/L (ref 3.5–5.1)
Sodium: 140 mEq/L (ref 136–145)
Total Protein: 7.1 g/dL (ref 6.4–8.3)

## 2013-02-24 LAB — CBC WITH DIFFERENTIAL/PLATELET
BASO%: 1.3 % (ref 0.0–2.0)
EOS%: 3 % (ref 0.0–7.0)
HCT: 41.6 % (ref 34.8–46.6)
MCH: 28.2 pg (ref 25.1–34.0)
MCHC: 31.6 g/dL (ref 31.5–36.0)
MONO#: 0.4 10*3/uL (ref 0.1–0.9)
NEUT#: 3 10*3/uL (ref 1.5–6.5)
NEUT%: 62.6 % (ref 38.4–76.8)
RDW: 14.8 % — ABNORMAL HIGH (ref 11.2–14.5)
WBC: 4.8 10*3/uL (ref 3.9–10.3)
lymph#: 1.2 10*3/uL (ref 0.9–3.3)

## 2013-02-28 ENCOUNTER — Ambulatory Visit: Payer: Medicare Other | Admitting: Oncology

## 2013-03-01 ENCOUNTER — Ambulatory Visit (HOSPITAL_BASED_OUTPATIENT_CLINIC_OR_DEPARTMENT_OTHER): Payer: Medicare Other | Admitting: Oncology

## 2013-03-01 VITALS — BP 132/74 | HR 84 | Temp 97.6°F | Resp 19 | Ht 64.0 in | Wt 224.3 lb

## 2013-03-01 DIAGNOSIS — Z170 Estrogen receptor positive status [ER+]: Secondary | ICD-10-CM

## 2013-03-01 DIAGNOSIS — C50912 Malignant neoplasm of unspecified site of left female breast: Secondary | ICD-10-CM

## 2013-03-01 DIAGNOSIS — C50419 Malignant neoplasm of upper-outer quadrant of unspecified female breast: Secondary | ICD-10-CM

## 2013-03-01 DIAGNOSIS — C50412 Malignant neoplasm of upper-outer quadrant of left female breast: Secondary | ICD-10-CM

## 2013-03-01 NOTE — Progress Notes (Signed)
ID: [name]   DOB: December 11, 1931  MR#: [id number]  NWG#:[id number]   PCP:  Kimberly Divine, MD GYN:  Kimberly Mackie MD SUCyndia Bent, MD OTHER:  [name]  HISTORY OF PRESENT ILLNESS: The patient had screening mammography in February 2012 which showed some calcifications in the left breast.  She was brought back for additional views in March, and this showed a few minimally heterogeneous and punctate calcifications in the upper outer portion of the left breast spanning up to 2 cm.  There was no associated mass or distortion.  After appropriate discussion, it was felt that repeat mammography in 6 months would be appropriate, and the patient returned October 17th for diagnostic left mammography.  At this time, the calcifications appeared to be increasing and were more linear.  Again, there was no evidence of an associated mass or distortion.  A second smaller cluster of punctate calcifications in the upper outer left breast was unchanged.  The patient does have heterogeneously dense breast tissue which, of course, decreases mammographic sensitivity.   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  With this information, the patient was referred to Dr. Jamey [name], and after undergoing right diagnostic mammography October 25th, which was unremarkable, had bilateral breast MRIs on October 30th.  This showed an area of clumped and linear enhancement along the biopsy tract measuring 1.2 cm.  There was no suspicious mass or abnormal enhancement and no internal mammary or axillary adenopathy.  Bilateral thyroid masses were incidentally imaged.   She underwent Left lumpectomy 01/22/2011 for a 0.6 cm DCIS, low grade, with ample margins and started letrozole December 2012  INTERVAL  HISTORY: [name] returns today for followup of her left breast carcinoma. At the last visit here, we discussed her trying exemestane. She did start that in October, but stopped it about 2 weeks ago because she was starting to [name] her hair. At this point she is pretty much "against" further anti-estrogen treatment.  REVIEW OF SYSTEMS: [name] is having some arthritis problems involving particularly the right wrist, but also the knees. Or though is involved. She is a little bit of a running nose, but no cough or phlegm production. She has mild stress urinary incontinence. A detailed review of systems today was otherwise stable  PAST MEDICAL HISTORY: Past Medical History  Diagnosis Date  . 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 Hypertension   . Thyroid nodule   . GERD (gastroesophageal reflux disease)   . Arthritis     knees  . Normal cardiac stress test 2000    done at [facility]    PAST SURGICAL HISTORY: Past Surgical History  Procedure Laterality Date  . Tonsillectomy  1944  . Adenoidectomy    . Breast biopsy  1974    right  . Breast biopsy  1977    left  . Breast lumpectomy w/ needle localization  01/22/2011    Left - Dr [name]  . Breast lumpectomy  01/22/2011    Procedure: LUMPECTOMY;  Surgeon: Kimberly Paris, MD;  Location: [facility] OR;  Service: General;  Laterality: Left;  Needle localization  @ [facility]  12:30  . Abdominal hysterectomy      partial  . Colonoscopy  2009    FAMILY HISTORY Family History  Problem Relation Age of Onset  .  Cancer Paternal Aunt   . Cancer Paternal Uncle 64    colon  . Cancer Maternal Grandmother 49    breast  The patient's father died at the age of 9. (He was one of 11 children.) The patient's mother died at the age of 17 following a stroke.  The patient was an only child.  The patient's mother's mother was diagnosed with breast cancer at the age of 32.  The only other cancer that she is aware of in the family is colon cancer in a paternal uncle.  There  is no history of ovarian cancer in the family.  GYNECOLOGIC HISTORY: She had menarche at age 51.  Last period at the time of her hysterectomy in 1986.  No BSO. She used hormone replacement for approximately 4 years, stopping in 1990.  She is GX P1, used birth control pills for approximately 1 year remotely.  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  Her daughter, [name], teaches math here in [place], although she is currently semiretired because of disability.  The patient lives with [name] and [name]'s 3 grandchildren, ages 31 and 49 year old twins.   ADVANCED DIRECTIVES:  HEALTH MAINTENANCE: History  Substance Use Topics  . Smoking status: Former Smoker    Quit date: 03/10/1981  . Smokeless tobacco: Never Used  . Alcohol Use: No     Colonoscopy: 2009 (Buccini)  PAP: s/p hysterectomy  Bone density: [facility] 2010 "OK"  Lipid panel:  UTD, Dr. [name]  Allergies  Allergen Reactions  . Penicillins Hives  . Tape Rash    Current Outpatient Prescriptions  Medication Sig Dispense Refill  . B Complex-C (SUPER B COMPLEX PO) Take 1 tablet by mouth daily.       [name] Bioflavonoid Products (GRAPE SEED PO) Take 1 tablet by mouth daily. Grape seed 50mg  & magnesium 15mg  OTC       . Cholecalciferol (VITAMIN D-3 PO) Take 2,000 Units by mouth daily.       . Coenzyme Q10 (COQ-10 PO) Take 100 mg by mouth daily.       [name] CRANBERRY PO Take 1 capsule by mouth daily.        [name] EPINEPHrine (EPIPEN) 0.3 mg/0.3 mL SOAJ injection Inject 0.3 mLs (0.3 mg total) into the muscle as needed.  1 Device  0  . exemestane (AROMASIN) 25 MG tablet Take 1 tablet (25 mg total) by mouth daily.  90 tablet  3  . ezetimibe-simvastatin (VYTORIN) 10-20 MG per tablet Take 1 tablet by mouth at bedtime.      . famotidine (PEPCID) 20 MG tablet Take 1 tablet (20 mg total) by mouth 2 (two) times daily.  14 tablet  0  . Garlic 1000  MG CAPS Take 1,000 mg by mouth daily.        . Ginger, Zingiber officinalis, (GINGER PO) Take 550 mg by mouth daily.      Chilton Si Tea, Camillia sinensis, 315 MG CAPS Take 315 mg by mouth daily. otc       . loratadine (CLARITIN) 10 MG tablet Take 1 tablet (10 mg total) by mouth daily.  7 tablet  0  . metFORMIN (GLUCOPHAGE-XR) 500 MG 24 hr tablet Take 500 mg by mouth daily with breakfast.       . mirabegron ER (MYRBETRIQ) 25 MG TB24 tablet Take 25 mg by mouth daily.      . Multiple  Vitamins-Minerals (MULTIVITAMIN WITH MINERALS) tablet Take by mouth daily.        [name] omega-3 acid ethyl esters (LOVAZA) 1 G capsule Take 1 g by mouth daily.        [name] omeprazole (PRILOSEC) 10 MG capsule Take 10 mg by mouth daily.        . predniSONE (DELTASONE) 50 MG tablet Take 1 tablet (50 mg total) by mouth daily.  5 tablet  0  . travoprost, benzalkonium, (TRAVATAN) 0.004 % ophthalmic solution Place 1 drop into both eyes daily. 5% solution per patient      . UNABLE TO FIND 500 mg daily. TUMERIC      . vitamin C (ASCORBIC ACID) 500 MG tablet Take 1,000 mg by mouth daily.        . [DISCONTINUED] benazepril (LOTENSIN) 10 MG tablet Take 5 mg by mouth daily.        No current facility-administered medications for this visit.    OBJECTIVE: Elderly woman in no acute distress Filed Vitals:   03/01/13 1622  BP: 132/74  Pulse: 84  Temp: 97.6 F (36.4 C)  Resp: 19     Body mass index is 38.48 kg/(m^2).    ECOG FS:1 Filed Weights   03/01/13 1622  Weight: 224 lb 4.8 oz (101.742 kg)   Sclerae unicteric, pupils equal and reactive Oropharynx clear and moist-- no thrush No cervical or supraclavicular adenopathy Lungs no rales or rhonchi Heart regular rate and rhythm Abd soft, nontender, positive bowel sounds MSK no focal spinal tenderness, no upper extremity lymphedema Neuro: nonfocal, well oriented, appropriate affect Breasts: The right breast is unremarkable. The left breast is status post lumpectomy. There is no  evidence of local recurrence. Both axillae are benign.  LAB RESULTS:  Lab Results  Component Value Date   WBC 4.8 02/24/2013   NEUTROABS 3.0 02/24/2013   HGB 13.2 02/24/2013   HCT 41.6 02/24/2013   MCV 89.3 02/24/2013   PLT 224 02/24/2013      Chemistry      Component Value Date/Time   NA 140 02/24/2013 1347   NA 139 01/22/2011 1431   K 4.1 02/24/2013 1347   K 4.1 01/22/2011 1431   CL 103 01/22/2011 1431   CO2 25 02/24/2013 1347   CO2 26 01/22/2011 1431   BUN 10.2 02/24/2013 1347   BUN 16 01/22/2011 1431   CREATININE 0.8 02/24/2013 1347   CREATININE 0.92 01/22/2011 1431      Component Value Date/Time   CALCIUM 9.8 02/24/2013 1347   CALCIUM 10.4 01/22/2011 1431   ALKPHOS 80 02/24/2013 1347   ALKPHOS 76 01/08/2011 1248   AST 21 02/24/2013 1347   AST 22 01/08/2011 1248   ALT 14 02/24/2013 1347   ALT 18 01/08/2011 1248   BILITOT 0.53 02/24/2013 1347   BILITOT 0.3 01/08/2011 1248      STUDIES:  Mm Digital Diagnostic Bilat  02/21/2013   CLINICAL DATA:  History of malignant lumpectomy of the left breast in 2012. Annual re-evaluation.  EXAM: DIGITAL DIAGNOSTIC  bilateral MAMMOGRAM WITH CAD  COMPARISON:  Prior studies.  ACR Breast Density Category c: The breasts are heterogeneously dense, which may obscure small masses.  FINDINGS: There are mild scarring changes located laterally within the left breast related to the patient's lumpectomy. There is no specific evidence for recurrent tumor or developing malignancy within either breast. There is a small stable cluster of benign appearing calcifications located within the central left breast.  Mammographic images were processed with  CAD.  IMPRESSION: Stable parenchymal pattern. No findings worrisome for recurrent tumor or developing malignancy. Recommend annual diagnostic mammography.  RECOMMENDATION: Annual diagnostic mammography.  I have discussed the findings and recommendations with the patient. Results were also provided in  writing at the conclusion of the visit. If applicable, a reminder letter will be sent to the patient regarding the next appointment.  BI-RADS CATEGORY  2: Benign Finding(s)   Electronically Signed   By: Anselmo Pickler M.D.   On: 02/21/2013 16:57     ASSESSMENT: 79 y.o.  [place] woman, t    (1) status pos Left lumpectomy NOV 2012 for a low-grade ductal carcinoma in situ, with negative and ample margins, strongly estrogen and progesterone receptor positive.  (2) on letrozole since December 2012, discontinued on 06/08/2012 secondary to increased joint pain  (3)  the plan was to start exemestane on 07/07/2012, but the patient did not start it until October 2014. She stopped it December 2014 because her hair loss.  PLAN:  Jamine has given to aromatase inhibitors a good try. She does not think she is going to be able to tolerate anti-estrogens and I tend to agree. Accordingly, we are going to follow her off treatment. She understands that this is safe in her case, since this cancer was noninvasive and therefore not life threatening.  She is going to see [place] in August of 2015, and then she will see me again in February of 2016 at which time we will start seeing her on a once a year basis.  She has a good understanding of this plan, and agrees with it. She will call with any problems that may develop before her next visit here  Lowella Dell, MD   03/01/2013

## 2013-03-02 ENCOUNTER — Telehealth: Payer: Self-pay | Admitting: Oncology

## 2013-03-02 DIAGNOSIS — C50412 Malignant neoplasm of upper-outer quadrant of left female breast: Secondary | ICD-10-CM | POA: Insufficient documentation

## 2013-03-02 NOTE — Telephone Encounter (Signed)
appts made per 12/23 POF for August 2015 Cal mailed to pt shh

## 2013-03-08 ENCOUNTER — Encounter (INDEPENDENT_AMBULATORY_CARE_PROVIDER_SITE_OTHER): Payer: Self-pay | Admitting: Surgery

## 2013-03-08 ENCOUNTER — Ambulatory Visit (INDEPENDENT_AMBULATORY_CARE_PROVIDER_SITE_OTHER): Payer: Medicare Other | Admitting: Surgery

## 2013-03-08 VITALS — BP 133/81 | HR 70 | Temp 97.2°F | Resp 16 | Ht 62.5 in | Wt 227.4 lb

## 2013-03-08 DIAGNOSIS — Z853 Personal history of malignant neoplasm of breast: Secondary | ICD-10-CM

## 2013-03-08 NOTE — Progress Notes (Signed)
NAME: [name]       DOB: 1932/02/01           DATE: 03/08/2013       MRN: [id number]   PRESLEI BLAKLEY is a 78 y.o.Marland Kitchenfemale who presents for routine followup of her Left breast cancer, DCIS, receptor + diagnosed in 2012 and treated with Lumpectomy and then anti-estrogen which she has stopped taking due to side effects..She did not have radiation She has no problems or concerns on either side.  PFSH: She has had no significant changes since the last visit here.  ROS: There have been no significant changes since the last visit here  EXAM:  VS: BP 133/81  Pulse 70  Temp(Src) 97.2 F (36.2 C) (Temporal)  Resp 16  Ht 5' 2.5" (1.588 m)  Wt 227 lb 6.4 oz (103.148 kg)  BMI 40.90 kg/m2   General: The patient is alert, oriented, generally healty appearing, NAD. Mood and affect are normal.  Breasts:  Right is normal. Left shows a little loss of tissue at the lumpectomy site, LOQ. Other than that wnl  Lymphatics: She has no axillary or supraclavicular adenopathy on either side.  Extremities: Full ROM of the surgical side with no lymphedema noted.  Data Reviewed: Mammogram 02/21/13: CLINICAL DATA: History of malignant lumpectomy of the left breast  in 2012. Annual re-evaluation.  EXAM:  DIGITAL DIAGNOSTIC bilateral MAMMOGRAM WITH CAD  COMPARISON: Prior studies.  ACR Breast Density Category c: The breasts are heterogeneously  dense, which may obscure small masses.  FINDINGS:  There are mild scarring changes located laterally within the left  breast related to the patient's lumpectomy. There is no specific  evidence for recurrent tumor or developing malignancy within either  breast. There is a small stable cluster of benign appearing  calcifications located within the central left breast.  Mammographic images were processed with CAD.  IMPRESSION:  Stable parenchymal pattern. No findings worrisome for recurrent  tumor or developing malignancy. Recommend annual diagnostic   mammography.  RECOMMENDATION:  Annual diagnostic mammography.  I have discussed the findings and recommendations with the patient.  Results were also provided in writing at the conclusion of the  visit. If applicable, a reminder letter will be sent to the patient  regarding the next appointment.  BI-RADS CATEGORY 2: Benign Finding(s)  Electronically Signed  By: Anselmo Pickler M.D.  On: 02/21/2013 16:57   Impression: Doing well, with no evidence of recurrent cancer or new cancer  Plan: RTC one year

## 2013-03-08 NOTE — Patient Instructions (Signed)
Continue annual mammograms and follow ups 

## 2013-05-19 DIAGNOSIS — H4011X0 Primary open-angle glaucoma, stage unspecified: Secondary | ICD-10-CM | POA: Diagnosis not present

## 2013-07-26 DIAGNOSIS — H4011X0 Primary open-angle glaucoma, stage unspecified: Secondary | ICD-10-CM | POA: Diagnosis not present

## 2013-07-26 DIAGNOSIS — H2510 Age-related nuclear cataract, unspecified eye: Secondary | ICD-10-CM | POA: Diagnosis not present

## 2013-08-30 DIAGNOSIS — R32 Unspecified urinary incontinence: Secondary | ICD-10-CM | POA: Diagnosis not present

## 2013-08-30 DIAGNOSIS — D0590 Unspecified type of carcinoma in situ of unspecified breast: Secondary | ICD-10-CM | POA: Diagnosis not present

## 2013-08-30 DIAGNOSIS — I10 Essential (primary) hypertension: Secondary | ICD-10-CM | POA: Diagnosis not present

## 2013-08-30 DIAGNOSIS — Z0000 Encounter for general adult medical examination without abnormal findings: Secondary | ICD-10-CM | POA: Diagnosis not present

## 2013-08-30 DIAGNOSIS — E049 Nontoxic goiter, unspecified: Secondary | ICD-10-CM | POA: Diagnosis not present

## 2013-08-30 DIAGNOSIS — Z23 Encounter for immunization: Secondary | ICD-10-CM | POA: Diagnosis not present

## 2013-08-30 DIAGNOSIS — E119 Type 2 diabetes mellitus without complications: Secondary | ICD-10-CM | POA: Diagnosis not present

## 2013-08-30 DIAGNOSIS — E7800 Pure hypercholesterolemia, unspecified: Secondary | ICD-10-CM | POA: Diagnosis not present

## 2013-10-24 ENCOUNTER — Other Ambulatory Visit: Payer: Self-pay | Admitting: *Deleted

## 2013-10-24 ENCOUNTER — Telehealth: Payer: Self-pay | Admitting: Adult Health

## 2013-10-24 DIAGNOSIS — C50412 Malignant neoplasm of upper-outer quadrant of left female breast: Secondary | ICD-10-CM

## 2013-10-24 NOTE — Telephone Encounter (Signed)
cld & spoke to pt to adv that appt chaged to 1:15/adv to be here @ 12:45 for labs-pt understood-adv appt w/LC

## 2013-10-25 ENCOUNTER — Other Ambulatory Visit: Payer: Medicare Other

## 2013-10-25 ENCOUNTER — Encounter: Payer: Self-pay | Admitting: Adult Health

## 2013-10-25 ENCOUNTER — Ambulatory Visit: Payer: Medicare Other | Admitting: Oncology

## 2013-10-25 ENCOUNTER — Other Ambulatory Visit (HOSPITAL_BASED_OUTPATIENT_CLINIC_OR_DEPARTMENT_OTHER): Payer: Medicare Other

## 2013-10-25 ENCOUNTER — Ambulatory Visit (HOSPITAL_BASED_OUTPATIENT_CLINIC_OR_DEPARTMENT_OTHER): Payer: Medicare Other | Admitting: Adult Health

## 2013-10-25 VITALS — BP 138/73 | HR 70 | Temp 97.7°F | Resp 18 | Ht 62.5 in | Wt 220.2 lb

## 2013-10-25 DIAGNOSIS — D0590 Unspecified type of carcinoma in situ of unspecified breast: Secondary | ICD-10-CM

## 2013-10-25 DIAGNOSIS — Z170 Estrogen receptor positive status [ER+]: Secondary | ICD-10-CM

## 2013-10-25 DIAGNOSIS — C50412 Malignant neoplasm of upper-outer quadrant of left female breast: Secondary | ICD-10-CM

## 2013-10-25 LAB — COMPREHENSIVE METABOLIC PANEL (CC13)
ALT: 12 U/L (ref 0–55)
ANION GAP: 8 meq/L (ref 3–11)
AST: 17 U/L (ref 5–34)
Albumin: 3.7 g/dL (ref 3.5–5.0)
Alkaline Phosphatase: 70 U/L (ref 40–150)
BUN: 12.3 mg/dL (ref 7.0–26.0)
CALCIUM: 9.3 mg/dL (ref 8.4–10.4)
CO2: 26 meq/L (ref 22–29)
CREATININE: 0.9 mg/dL (ref 0.6–1.1)
Chloride: 108 mEq/L (ref 98–109)
GLUCOSE: 147 mg/dL — AB (ref 70–140)
Potassium: 4.3 mEq/L (ref 3.5–5.1)
SODIUM: 143 meq/L (ref 136–145)
TOTAL PROTEIN: 7 g/dL (ref 6.4–8.3)
Total Bilirubin: 0.34 mg/dL (ref 0.20–1.20)

## 2013-10-25 LAB — CBC WITH DIFFERENTIAL/PLATELET
BASO%: 0.7 % (ref 0.0–2.0)
Basophils Absolute: 0 10*3/uL (ref 0.0–0.1)
EOS ABS: 0.1 10*3/uL (ref 0.0–0.5)
EOS%: 2.2 % (ref 0.0–7.0)
HCT: 42.2 % (ref 34.8–46.6)
HGB: 13.5 g/dL (ref 11.6–15.9)
LYMPH%: 23 % (ref 14.0–49.7)
MCH: 28.8 pg (ref 25.1–34.0)
MCHC: 31.9 g/dL (ref 31.5–36.0)
MCV: 90.5 fL (ref 79.5–101.0)
MONO#: 0.3 10*3/uL (ref 0.1–0.9)
MONO%: 7 % (ref 0.0–14.0)
NEUT%: 67.1 % (ref 38.4–76.8)
NEUTROS ABS: 3.3 10*3/uL (ref 1.5–6.5)
PLATELETS: 227 10*3/uL (ref 145–400)
RBC: 4.67 10*6/uL (ref 3.70–5.45)
RDW: 15 % — ABNORMAL HIGH (ref 11.2–14.5)
WBC: 4.9 10*3/uL (ref 3.9–10.3)
lymph#: 1.1 10*3/uL (ref 0.9–3.3)

## 2013-10-25 NOTE — Patient Instructions (Signed)
You are doing well.  You have no sign of recurrence.  I recommend healthy diet, exercise, and monthly breast exams.  We will see you back in 6 months.    Breast Self-Awareness Practicing breast self-awareness may pick up problems early, prevent significant medical complications, and possibly save your life. By practicing breast self-awareness, you can become familiar with how your breasts look and feel and if your breasts are changing. This allows you to notice changes early. It can also offer you some reassurance that your breast health is good. One way to learn what is normal for your breasts and whether your breasts are changing is to do a breast self-exam. If you find a lump or something that was not present in the past, it is best to contact your caregiver right away. Other findings that should be evaluated by your caregiver include nipple discharge, especially if it is bloody; skin changes or reddening; areas where the skin seems to be pulled in (retracted); or new lumps and bumps. Breast pain is seldom associated with cancer (malignancy), but should also be evaluated by a caregiver. HOW TO PERFORM A BREAST SELF-EXAM The best time to examine your breasts is 5-7 days after your menstrual period is over. During menstruation, the breasts are lumpier, and it may be more difficult to pick up changes. If you do not menstruate, have reached menopause, or had your uterus removed (hysterectomy), you should examine your breasts at regular intervals, such as monthly. If you are breastfeeding, examine your breasts after a feeding or after using a breast pump. Breast implants do not decrease the risk for lumps or tumors, so continue to perform breast self-exams as recommended. Talk to your caregiver about how to determine the difference between the implant and breast tissue. Also, talk about the amount of pressure you should use during the exam. Over time, you will become more familiar with the variations of your  breasts and more comfortable with the exam. A breast self-exam requires you to remove all your clothes above the waist. 1. Look at your breasts and nipples. Stand in front of a mirror in a room with good lighting. With your hands on your hips, push your hands firmly downward. Look for a difference in shape, contour, and size from one breast to the other (asymmetry). Asymmetry includes puckers, dips, or bumps. Also, look for skin changes, such as reddened or scaly areas on the breasts. Look for nipple changes, such as discharge, dimpling, repositioning, or redness. 2. Carefully feel your breasts. This is best done either in the shower or tub while using soapy water or when flat on your back. Place the arm (on the side of the breast you are examining) above your head. Use the pads (not the fingertips) of your three middle fingers on your opposite hand to feel your breasts. Start in the underarm area and use  inch (2 cm) overlapping circles to feel your breast. Use 3 different levels of pressure (light, medium, and firm pressure) at each circle before moving to the next circle. The light pressure is needed to feel the tissue closest to the skin. The medium pressure will help to feel breast tissue a little deeper, while the firm pressure is needed to feel the tissue close to the ribs. Continue the overlapping circles, moving downward over the breast until you feel your ribs below your breast. Then, move one finger-width towards the center of the body. Continue to use the  inch (2 cm) overlapping circles to  feel your breast as you move slowly up toward the collar bone (clavicle) near the base of the neck. Continue the up and down exam using all 3 pressures until you reach the middle of the chest. Do this with each breast, carefully feeling for lumps or changes. 3.  Keep a written record with breast changes or normal findings for each breast. By writing this information down, you do not need to depend only on memory  for size, tenderness, or location. Write down where you are in your menstrual cycle, if you are still menstruating. Breast tissue can have some lumps or thick tissue. However, see your caregiver if you find anything that concerns you.  SEEK MEDICAL CARE IF:  You see a change in shape, contour, or size of your breasts or nipples.   You see skin changes, such as reddened or scaly areas on the breasts or nipples.   You have an unusual discharge from your nipples.   You feel a new lump or unusually thick areas.  Document Released: 02/24/2005 Document Revised: 02/11/2012 Document Reviewed: 06/11/2011 [facility] Patient Information 2015 [place], [place]. This information is not intended to replace advice given to you by your health care provider. Make sure you discuss any questions you have with your health care provider.

## 2013-10-25 NOTE — Progress Notes (Signed)
ID: [name]   DOB: 29-Jun-1931  MR#: [id number]  LZJ#:[id number]   PCP:  Osborne Casco, MD GYN:  Brien Few MD SUNeldon Mc, MD OTHER:  Ardine Bjork  HISTORY OF PRESENT ILLNESS: The patient had screening mammography in February 2012 which showed some calcifications in the left breast.  She was brought back for additional views in March, and this showed a few minimally heterogeneous and punctate calcifications in the upper outer portion of the left breast spanning up to 2 cm.  There was no associated mass or distortion.  After appropriate discussion, it was felt that repeat mammography in 6 months would be appropriate, and the patient returned October 17th for diagnostic left mammography.  At this time, the calcifications appeared to be increasing and were more linear.  Again, there was no evidence of an associated mass or distortion.  A second smaller cluster of punctate calcifications in the upper outer left breast was unchanged.  The patient does have heterogeneously dense breast tissue which, of course, decreases mammographic sensitivity.   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  With this information, the patient was referred to Dr. Margot Chimes, and after undergoing right diagnostic mammography October 25th, which was unremarkable, had bilateral breast MRIs on October 30th.  This showed an area of clumped and linear enhancement along the biopsy tract measuring 1.2 cm.  There was no suspicious mass or abnormal enhancement and no internal mammary or axillary adenopathy.  Bilateral thyroid masses were incidentally imaged.   She underwent Left lumpectomy 01/22/2011 for a 0.6 cm DCIS, low grade, with ample margins and started letrozole December 2012  INTERVAL  HISTORY: [name] is here today for follow up of her breast cancer.  She is doing well today.  She is not taking any further anti-estrogen therapy and tells me she is doing well off the therapy.  She otherwise denies fevers, chills, nausea, vomiting, constipation, diarrhea, pain, or any further concerns.    REVIEW OF SYSTEMS: A 10 point review of systems was conducted and is otherwise negative except for what is noted above.     PAST MEDICAL HISTORY: Past Medical History  Diagnosis Date  . 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 Hypertension   . Thyroid nodule   . GERD (gastroesophageal reflux disease)   . Arthritis     knees  . Normal cardiac stress test 2000    done at [facility]    PAST SURGICAL HISTORY: Past Surgical History  Procedure Laterality Date  . Tonsillectomy  1944  . Adenoidectomy    . Breast biopsy  1974    right  . Breast biopsy  1977    left  . Breast lumpectomy w/ needle localization  01/22/2011    Left - Dr Margot Chimes  . Breast lumpectomy  01/22/2011    Procedure: LUMPECTOMY;  Surgeon: Haywood Lasso, MD;  Location: [place];  Service: General;  Laterality: Left;  Needle localization  @ [facility]  12:30  . Abdominal hysterectomy      partial  . Colonoscopy  2009    FAMILY HISTORY Family History  Problem Relation Age of Onset  . Cancer Paternal Aunt   . Cancer Paternal Uncle 65    colon  . Cancer Maternal Grandmother 50    breast  The  patient's father died at the age of 62. (He was one of 11 children.) The patient's mother died at the age of 51 following a stroke.  The patient was an only child.  The patient's mother's mother was diagnosed with breast cancer at the age of 41.  The only other cancer that she is aware of in the family is colon cancer in a paternal uncle.  There is no history of ovarian cancer in the family.  GYNECOLOGIC HISTORY: (updated 10/25/2013) She had menarche at age 43.  Last period at the time of her hysterectomy in 1986.  No BSO. She used  hormone replacement for approximately 4 years, stopping in 1990.  She is GX P1, used birth control pills for approximately 1 year remotely.  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  Her daughter, [name], teaches math here in [place], although she is currently semiretired because of disability.  The patient lives with [name] and [name]'s children, two 66 year old twins (boy and girl), and 2 year old grandson.      ADVANCED DIRECTIVES:  HEALTH MAINTENANCE: History  Substance Use Topics  . Smoking status: Former Smoker    Quit date: 03/10/1981  . Smokeless tobacco: Never Used  . Alcohol Use: No     Colonoscopy: 2014  PAP: s/p hysterectomy  Bone density: 2013, normal  Lipid panel:  08/2013, Dr. Laurann [place]  Allergies  Allergen Reactions  . Penicillins Hives  . Tape Rash    Current Outpatient Prescriptions  Medication Sig Dispense Refill  . B Complex-C (SUPER B COMPLEX PO) Take 1 tablet by mouth daily.       [name] Bioflavonoid Products (GRAPE SEED PO) Take 1 tablet by mouth daily. Grape seed 50mg  & magnesium 15mg  OTC       . Cholecalciferol (VITAMIN D-3 PO) Take 2,000 Units by mouth daily.       . Coenzyme Q10 (COQ-10 PO) Take 100 mg by mouth daily.       [name] CRANBERRY PO Take 1 capsule by mouth daily.        . Garlic 7322 MG CAPS Take 1,000 mg by mouth daily.        Nyoka Cowden Tea, Camillia sinensis, 315 MG CAPS Take 315 mg by mouth daily. otc       . Multiple Vitamins-Minerals (MULTIVITAMIN WITH MINERALS) tablet Take by mouth daily.        [name] omega-3 acid ethyl esters (LOVAZA) 1 G capsule Take 1 g by mouth daily.        [name] omeprazole (PRILOSEC) 10 MG capsule Take 10 mg by mouth daily.        . simvastatin (ZOCOR) 10 MG tablet Take 10 mg by mouth daily.      . travoprost, benzalkonium, (TRAVATAN) 0.004 % ophthalmic solution Place 1 drop into both eyes daily.  5% solution per patient      . UNABLE TO FIND 500 mg daily. TUMERIC      . vitamin C (ASCORBIC ACID) 500 MG tablet Take 1,000 mg by mouth daily.        [name] exemestane (AROMASIN) 25 MG tablet Take 1 tablet (25 mg total) by mouth daily.  90 tablet  3  . Ginger, Zingiber officinalis, (GINGER PO) Take 550 mg by mouth daily.      . metFORMIN (GLUCOPHAGE-XR) 500 MG 24 hr tablet Take 500 mg by  mouth daily with breakfast.       . [DISCONTINUED] benazepril (LOTENSIN) 10 MG tablet Take 5 mg by mouth daily.        No current facility-administered medications for this visit.    OBJECTIVE: Elderly woman in no acute distress Filed Vitals:   10/25/13 1321  BP: 138/73  Pulse: 70  Temp: 97.7 F (36.5 C)  Resp: 18     Body mass index is 39.61 kg/(m^2).    ECOG FS:1 Filed Weights   10/25/13 1321  Weight: 220 lb 3.2 oz (99.882 kg)   GENERAL: Patient is a well appearing female in no acute distress HEENT:  Sclerae anicteric.  Oropharynx clear and moist. No ulcerations or evidence of oropharyngeal candidiasis. Neck is supple.  NODES:  No cervical, supraclavicular, or axillary lymphadenopathy palpated.  BREAST EXAM:  Right breast without masses, nodules, skin changes, left breast lumpectomy site without nodularity, no masses, nodules, signs of recurrence.  Benign bilateral breast exam.  LUNGS:  Clear to auscultation bilaterally.  No wheezes or rhonchi. HEART:  Regular rate and rhythm. No murmur appreciated. ABDOMEN:  Soft, nontender.  Positive, normoactive bowel sounds. No organomegaly palpated. MSK:  No focal spinal tenderness to palpation. Full range of motion bilaterally in the upper extremities. EXTREMITIES:  No peripheral edema.   SKIN:  Clear with no obvious rashes or skin changes. No nail dyscrasia. NEURO:  Nonfocal. Well oriented.  Appropriate affect.    LAB RESULTS:  Lab Results  Component Value Date   WBC 4.9 10/25/2013   NEUTROABS 3.3 10/25/2013   HGB 13.5 10/25/2013   HCT 42.2 10/25/2013    MCV 90.5 10/25/2013   PLT 227 10/25/2013      Chemistry      Component Value Date/Time   NA 140 02/24/2013 1347   NA 139 01/22/2011 1431   K 4.1 02/24/2013 1347   K 4.1 01/22/2011 1431   CL 103 01/22/2011 1431   CO2 25 02/24/2013 1347   CO2 26 01/22/2011 1431   BUN 10.2 02/24/2013 1347   BUN 16 01/22/2011 1431   CREATININE 0.8 02/24/2013 1347   CREATININE 0.92 01/22/2011 1431      Component Value Date/Time   CALCIUM 9.8 02/24/2013 1347   CALCIUM 10.4 01/22/2011 1431   ALKPHOS 80 02/24/2013 1347   ALKPHOS 76 01/08/2011 1248   AST 21 02/24/2013 1347   AST 22 01/08/2011 1248   ALT 14 02/24/2013 1347   ALT 18 01/08/2011 1248   BILITOT 0.53 02/24/2013 1347   BILITOT 0.3 01/08/2011 1248      STUDIES:  Mm Digital Diagnostic Bilat  02/21/2013   CLINICAL DATA:  History of malignant lumpectomy of the left breast in 2012. Annual re-evaluation.  EXAM: DIGITAL DIAGNOSTIC  bilateral MAMMOGRAM WITH CAD  COMPARISON:  Prior studies.  ACR Breast Density Category c: The breasts are heterogeneously dense, which may obscure small masses.  FINDINGS: There are mild scarring changes located laterally within the left breast related to the patient's lumpectomy. There is no specific evidence for recurrent tumor or developing malignancy within either breast. There is a small stable cluster of benign appearing calcifications located within the central left breast.  Mammographic images were processed with CAD.  IMPRESSION: Stable parenchymal pattern. No findings worrisome for recurrent tumor or developing malignancy. Recommend annual diagnostic mammography.  RECOMMENDATION: Annual diagnostic mammography.  I have discussed the findings and recommendations with the patient. Results were also provided in writing at the conclusion of the visit. If applicable, a  reminder letter will be sent to the patient regarding the next appointment.  BI-RADS CATEGORY  2: Benign Finding(s)   Electronically Signed   By: Luberta Robertson M.D.   On: 02/21/2013 16:57     ASSESSMENT: 80 y.o.  [place] woman,    (1) status pos Left lumpectomy NOV 2012 for a low-grade ductal carcinoma in situ, with negative and ample margins, strongly estrogen and progesterone receptor positive.  (2) on letrozole since December 2012, discontinued on 06/08/2012 secondary to increased joint pain  (3)  the plan was to start exemestane on 07/07/2012, but the patient did not start it until October 2014. She stopped it December 2014 because her hair loss.  PLAN:   [name] is doing well today.  She has no sign of recurrence we did update her health maintenance, family history, social history above.  She is up to date.  We reviewed survivorship including healthy diet, exercise, and monthly breast exams.  I gave her information about this in her AVS.    [name] will return in 6 months for follow up.    She has a good understanding of this plan, and agrees with it. She will call with any problems that may develop before her next visit here  I spent 25 minutes counseling the patient face to face.  The total time spent in the appointment was 30 minutes.  Minette Headland, [place] [phone]   10/25/2013

## 2013-10-28 ENCOUNTER — Telehealth: Payer: Self-pay | Admitting: Adult Health

## 2013-10-28 NOTE — Telephone Encounter (Signed)
, °

## 2013-12-01 DIAGNOSIS — Z23 Encounter for immunization: Secondary | ICD-10-CM | POA: Diagnosis not present

## 2013-12-12 DIAGNOSIS — R350 Frequency of micturition: Secondary | ICD-10-CM | POA: Diagnosis not present

## 2013-12-12 DIAGNOSIS — N3946 Mixed incontinence: Secondary | ICD-10-CM | POA: Diagnosis not present

## 2013-12-16 DIAGNOSIS — H4011X2 Primary open-angle glaucoma, moderate stage: Secondary | ICD-10-CM | POA: Diagnosis not present

## 2013-12-27 ENCOUNTER — Encounter: Payer: Self-pay | Admitting: *Deleted

## 2014-01-10 DIAGNOSIS — H4011X2 Primary open-angle glaucoma, moderate stage: Secondary | ICD-10-CM | POA: Diagnosis not present

## 2014-02-21 DIAGNOSIS — E119 Type 2 diabetes mellitus without complications: Secondary | ICD-10-CM | POA: Diagnosis not present

## 2014-02-21 DIAGNOSIS — E780 Pure hypercholesterolemia: Secondary | ICD-10-CM | POA: Diagnosis not present

## 2014-02-23 DIAGNOSIS — N3946 Mixed incontinence: Secondary | ICD-10-CM | POA: Diagnosis not present

## 2014-02-23 DIAGNOSIS — R350 Frequency of micturition: Secondary | ICD-10-CM | POA: Diagnosis not present

## 2014-04-17 DIAGNOSIS — N3941 Urge incontinence: Secondary | ICD-10-CM | POA: Diagnosis not present

## 2014-04-26 DIAGNOSIS — N3941 Urge incontinence: Secondary | ICD-10-CM | POA: Diagnosis not present

## 2014-05-01 ENCOUNTER — Ambulatory Visit: Payer: Medicare Other | Admitting: Oncology

## 2014-05-05 ENCOUNTER — Other Ambulatory Visit: Payer: Self-pay | Admitting: Family Medicine

## 2014-05-05 ENCOUNTER — Telehealth: Payer: Self-pay | Admitting: Oncology

## 2014-05-05 DIAGNOSIS — Z853 Personal history of malignant neoplasm of breast: Secondary | ICD-10-CM

## 2014-05-05 DIAGNOSIS — R922 Inconclusive mammogram: Secondary | ICD-10-CM

## 2014-05-05 DIAGNOSIS — N3941 Urge incontinence: Secondary | ICD-10-CM | POA: Diagnosis not present

## 2014-05-05 NOTE — Telephone Encounter (Signed)
pt cld left vm wanted to r/s appt-cld pt back left message adv to call back to r/s appt

## 2014-05-12 ENCOUNTER — Ambulatory Visit
Admission: RE | Admit: 2014-05-12 | Discharge: 2014-05-12 | Disposition: A | Discharge status: Home / Self Care | Payer: Medicare Other | Source: Ambulatory Visit | Attending: Family Medicine | Admitting: Family Medicine

## 2014-05-12 DIAGNOSIS — Z853 Personal history of malignant neoplasm of breast: Secondary | ICD-10-CM

## 2014-05-12 DIAGNOSIS — N3941 Urge incontinence: Secondary | ICD-10-CM | POA: Diagnosis not present

## 2014-05-19 DIAGNOSIS — N3941 Urge incontinence: Secondary | ICD-10-CM | POA: Diagnosis not present

## 2014-06-30 DIAGNOSIS — R350 Frequency of micturition: Secondary | ICD-10-CM | POA: Diagnosis not present

## 2014-06-30 DIAGNOSIS — N3941 Urge incontinence: Secondary | ICD-10-CM | POA: Diagnosis not present

## 2014-06-30 DIAGNOSIS — N3946 Mixed incontinence: Secondary | ICD-10-CM | POA: Diagnosis not present

## 2014-07-07 DIAGNOSIS — N3941 Urge incontinence: Secondary | ICD-10-CM | POA: Diagnosis not present

## 2014-07-13 DIAGNOSIS — E119 Type 2 diabetes mellitus without complications: Secondary | ICD-10-CM | POA: Diagnosis not present

## 2014-07-28 DIAGNOSIS — N3941 Urge incontinence: Secondary | ICD-10-CM | POA: Diagnosis not present

## 2014-08-04 DIAGNOSIS — N3941 Urge incontinence: Secondary | ICD-10-CM | POA: Diagnosis not present

## 2014-08-11 DIAGNOSIS — N3941 Urge incontinence: Secondary | ICD-10-CM | POA: Diagnosis not present

## 2014-08-17 DIAGNOSIS — N3941 Urge incontinence: Secondary | ICD-10-CM | POA: Diagnosis not present

## 2014-08-23 DIAGNOSIS — H4011X1 Primary open-angle glaucoma, mild stage: Secondary | ICD-10-CM | POA: Diagnosis not present

## 2014-08-23 DIAGNOSIS — H04123 Dry eye syndrome of bilateral lacrimal glands: Secondary | ICD-10-CM | POA: Diagnosis not present

## 2014-08-25 DIAGNOSIS — N3941 Urge incontinence: Secondary | ICD-10-CM | POA: Diagnosis not present

## 2014-08-25 DIAGNOSIS — R32 Unspecified urinary incontinence: Secondary | ICD-10-CM | POA: Diagnosis not present

## 2014-08-25 DIAGNOSIS — N3946 Mixed incontinence: Secondary | ICD-10-CM | POA: Diagnosis not present

## 2014-08-25 DIAGNOSIS — E049 Nontoxic goiter, unspecified: Secondary | ICD-10-CM | POA: Diagnosis not present

## 2014-08-25 DIAGNOSIS — Z853 Personal history of malignant neoplasm of breast: Secondary | ICD-10-CM | POA: Diagnosis not present

## 2014-08-25 DIAGNOSIS — E1165 Type 2 diabetes mellitus with hyperglycemia: Secondary | ICD-10-CM | POA: Diagnosis not present

## 2014-08-25 DIAGNOSIS — R350 Frequency of micturition: Secondary | ICD-10-CM | POA: Diagnosis not present

## 2014-08-25 DIAGNOSIS — K219 Gastro-esophageal reflux disease without esophagitis: Secondary | ICD-10-CM | POA: Diagnosis not present

## 2014-08-25 DIAGNOSIS — E119 Type 2 diabetes mellitus without complications: Secondary | ICD-10-CM | POA: Diagnosis not present

## 2014-08-25 DIAGNOSIS — E780 Pure hypercholesterolemia: Secondary | ICD-10-CM | POA: Diagnosis not present

## 2014-08-25 DIAGNOSIS — Z0000 Encounter for general adult medical examination without abnormal findings: Secondary | ICD-10-CM | POA: Diagnosis not present

## 2014-08-25 DIAGNOSIS — Z1389 Encounter for screening for other disorder: Secondary | ICD-10-CM | POA: Diagnosis not present

## 2014-09-22 DIAGNOSIS — N3941 Urge incontinence: Secondary | ICD-10-CM | POA: Diagnosis not present

## 2014-10-20 DIAGNOSIS — N3941 Urge incontinence: Secondary | ICD-10-CM | POA: Diagnosis not present

## 2014-11-09 DIAGNOSIS — M1711 Unilateral primary osteoarthritis, right knee: Secondary | ICD-10-CM | POA: Diagnosis not present

## 2014-11-09 DIAGNOSIS — M1712 Unilateral primary osteoarthritis, left knee: Secondary | ICD-10-CM | POA: Diagnosis not present

## 2014-11-09 DIAGNOSIS — G8929 Other chronic pain: Secondary | ICD-10-CM | POA: Insufficient documentation

## 2014-11-09 DIAGNOSIS — M170 Bilateral primary osteoarthritis of knee: Secondary | ICD-10-CM | POA: Diagnosis not present

## 2014-12-01 DIAGNOSIS — N3941 Urge incontinence: Secondary | ICD-10-CM | POA: Diagnosis not present

## 2014-12-12 DIAGNOSIS — Z23 Encounter for immunization: Secondary | ICD-10-CM | POA: Diagnosis not present

## 2014-12-21 DIAGNOSIS — M1711 Unilateral primary osteoarthritis, right knee: Secondary | ICD-10-CM | POA: Diagnosis not present

## 2014-12-21 DIAGNOSIS — M1712 Unilateral primary osteoarthritis, left knee: Secondary | ICD-10-CM | POA: Diagnosis not present

## 2014-12-28 DIAGNOSIS — E119 Type 2 diabetes mellitus without complications: Secondary | ICD-10-CM | POA: Diagnosis not present

## 2014-12-28 DIAGNOSIS — H401122 Primary open-angle glaucoma, left eye, moderate stage: Secondary | ICD-10-CM | POA: Diagnosis not present

## 2014-12-28 DIAGNOSIS — H2513 Age-related nuclear cataract, bilateral: Secondary | ICD-10-CM | POA: Diagnosis not present

## 2014-12-29 DIAGNOSIS — N3941 Urge incontinence: Secondary | ICD-10-CM | POA: Diagnosis not present

## 2015-02-15 DIAGNOSIS — M1712 Unilateral primary osteoarthritis, left knee: Secondary | ICD-10-CM | POA: Diagnosis not present

## 2015-02-15 DIAGNOSIS — M1711 Unilateral primary osteoarthritis, right knee: Secondary | ICD-10-CM | POA: Diagnosis not present

## 2015-02-22 DIAGNOSIS — N3941 Urge incontinence: Secondary | ICD-10-CM | POA: Diagnosis not present

## 2015-02-26 DIAGNOSIS — Z7984 Long term (current) use of oral hypoglycemic drugs: Secondary | ICD-10-CM | POA: Diagnosis not present

## 2015-02-26 DIAGNOSIS — E119 Type 2 diabetes mellitus without complications: Secondary | ICD-10-CM | POA: Diagnosis not present

## 2015-02-26 DIAGNOSIS — E7800 Pure hypercholesterolemia, unspecified: Secondary | ICD-10-CM | POA: Diagnosis not present

## 2015-03-22 DIAGNOSIS — N3941 Urge incontinence: Secondary | ICD-10-CM | POA: Diagnosis not present

## 2015-04-18 ENCOUNTER — Other Ambulatory Visit: Payer: Self-pay | Admitting: Family Medicine

## 2015-04-18 DIAGNOSIS — Z853 Personal history of malignant neoplasm of breast: Secondary | ICD-10-CM

## 2015-04-19 DIAGNOSIS — N3941 Urge incontinence: Secondary | ICD-10-CM | POA: Diagnosis not present

## 2015-05-08 DIAGNOSIS — H401132 Primary open-angle glaucoma, bilateral, moderate stage: Secondary | ICD-10-CM | POA: Diagnosis not present

## 2015-05-08 DIAGNOSIS — H2513 Age-related nuclear cataract, bilateral: Secondary | ICD-10-CM | POA: Diagnosis not present

## 2015-05-14 ENCOUNTER — Ambulatory Visit
Admission: RE | Admit: 2015-05-14 | Discharge: 2015-05-14 | Disposition: A | Discharge status: Home / Self Care | Payer: Medicare Other | Source: Ambulatory Visit | Attending: Family Medicine | Admitting: Family Medicine

## 2015-05-14 ENCOUNTER — Other Ambulatory Visit: Payer: Self-pay | Admitting: Family Medicine

## 2015-05-14 DIAGNOSIS — Z853 Personal history of malignant neoplasm of breast: Secondary | ICD-10-CM

## 2015-05-14 DIAGNOSIS — R928 Other abnormal and inconclusive findings on diagnostic imaging of breast: Secondary | ICD-10-CM | POA: Diagnosis not present

## 2015-05-14 DIAGNOSIS — N6489 Other specified disorders of breast: Secondary | ICD-10-CM | POA: Diagnosis not present

## 2015-05-31 DIAGNOSIS — N3941 Urge incontinence: Secondary | ICD-10-CM | POA: Diagnosis not present

## 2015-06-05 DIAGNOSIS — M25512 Pain in left shoulder: Secondary | ICD-10-CM | POA: Diagnosis not present

## 2015-06-11 ENCOUNTER — Other Ambulatory Visit: Payer: Self-pay | Admitting: Orthopedic Surgery

## 2015-06-28 ENCOUNTER — Inpatient Hospital Stay (HOSPITAL_COMMUNITY)
Admission: RE | Admit: 2015-06-28 | Discharge: 2015-06-28 | Disposition: A | Discharge status: Home / Self Care | Payer: Medicare Other | Source: Ambulatory Visit

## 2015-06-28 DIAGNOSIS — N3941 Urge incontinence: Secondary | ICD-10-CM | POA: Diagnosis not present

## 2015-07-05 ENCOUNTER — Encounter (HOSPITAL_COMMUNITY)
Admission: RE | Admit: 2015-07-05 | Discharge: 2015-07-05 | Disposition: A | Discharge status: Home / Self Care | Payer: Medicare Other | Source: Ambulatory Visit | Attending: Orthopedic Surgery | Admitting: Orthopedic Surgery

## 2015-07-05 ENCOUNTER — Encounter (HOSPITAL_COMMUNITY): Payer: Self-pay

## 2015-07-05 DIAGNOSIS — Z01818 Encounter for other preprocedural examination: Secondary | ICD-10-CM | POA: Insufficient documentation

## 2015-07-05 DIAGNOSIS — M1711 Unilateral primary osteoarthritis, right knee: Secondary | ICD-10-CM | POA: Insufficient documentation

## 2015-07-05 DIAGNOSIS — Z79899 Other long term (current) drug therapy: Secondary | ICD-10-CM | POA: Insufficient documentation

## 2015-07-05 DIAGNOSIS — Z01812 Encounter for preprocedural laboratory examination: Secondary | ICD-10-CM | POA: Diagnosis not present

## 2015-07-05 HISTORY — DX: Personal history of other diseases of the digestive system: Z87.19

## 2015-07-05 HISTORY — DX: Flaccid neuropathic bladder, not elsewhere classified: N31.2

## 2015-07-05 LAB — SURGICAL PCR SCREEN
MRSA, PCR: NEGATIVE
STAPHYLOCOCCUS AUREUS: NEGATIVE

## 2015-07-05 LAB — COMPREHENSIVE METABOLIC PANEL
ALT: 17 U/L (ref 14–54)
AST: 22 U/L (ref 15–41)
Albumin: 4.1 g/dL (ref 3.5–5.0)
Alkaline Phosphatase: 61 U/L (ref 38–126)
Anion gap: 11 (ref 5–15)
BILIRUBIN TOTAL: 0.8 mg/dL (ref 0.3–1.2)
BUN: 17 mg/dL (ref 6–20)
CO2: 24 mmol/L (ref 22–32)
CREATININE: 0.99 mg/dL (ref 0.44–1.00)
Calcium: 9.6 mg/dL (ref 8.9–10.3)
Chloride: 108 mmol/L (ref 101–111)
GFR, EST AFRICAN AMERICAN: 60 mL/min — AB (ref >60)
GFR, EST NON AFRICAN AMERICAN: 52 mL/min — AB (ref >60)
Glucose, Bld: 119 mg/dL — ABNORMAL HIGH (ref 65–99)
Potassium: 4.1 mmol/L (ref 3.5–5.1)
Sodium: 143 mmol/L (ref 135–145)
TOTAL PROTEIN: 7.3 g/dL (ref 6.5–8.1)

## 2015-07-05 LAB — URINALYSIS, ROUTINE W REFLEX MICROSCOPIC
BILIRUBIN URINE: NEGATIVE
GLUCOSE, UA: NEGATIVE mg/dL
HGB URINE DIPSTICK: NEGATIVE
KETONES UR: NEGATIVE mg/dL
Leukocytes, UA: NEGATIVE
Nitrite: NEGATIVE
PH: 5 (ref 5.0–8.0)
PROTEIN: NEGATIVE mg/dL
Specific Gravity, Urine: 1.018 (ref 1.005–1.030)

## 2015-07-05 LAB — CBC WITH DIFFERENTIAL/PLATELET
BASOS ABS: 0 10*3/uL (ref 0.0–0.1)
Basophils Relative: 1 %
EOS PCT: 2 %
Eosinophils Absolute: 0.1 10*3/uL (ref 0.0–0.7)
HEMATOCRIT: 44 % (ref 36.0–46.0)
Hemoglobin: 13.9 g/dL (ref 12.0–15.0)
LYMPHS ABS: 1.9 10*3/uL (ref 0.7–4.0)
LYMPHS PCT: 33 %
MCH: 28.8 pg (ref 26.0–34.0)
MCHC: 31.6 g/dL (ref 30.0–36.0)
MCV: 91.3 fL (ref 78.0–100.0)
MONO ABS: 0.3 10*3/uL (ref 0.1–1.0)
Monocytes Relative: 6 %
NEUTROS ABS: 3.3 10*3/uL (ref 1.7–7.7)
Neutrophils Relative %: 58 %
PLATELETS: 232 10*3/uL (ref 150–400)
RBC: 4.82 MIL/uL (ref 3.87–5.11)
RDW: 14.3 % (ref 11.5–15.5)
WBC: 5.6 10*3/uL (ref 4.0–10.5)

## 2015-07-05 LAB — GLUCOSE, CAPILLARY: Glucose-Capillary: 111 mg/dL — ABNORMAL HIGH (ref 65–99)

## 2015-07-05 LAB — APTT: APTT: 29 s (ref 24–37)

## 2015-07-05 LAB — PROTIME-INR
INR: 1.04 (ref 0.00–1.49)
PROTHROMBIN TIME: 13.8 s (ref 11.6–15.2)

## 2015-07-05 NOTE — Pre-Procedure Instructions (Signed)
[name]  07/05/2015      [facility] - [place], [place] - [address] [place] [address] Phone: [phone] Fax: [place] [place] [address] Phone: [phone] Fax: [phone]    Your procedure is scheduled on   Monday  07/09/15  Report to [facility] Admitting at 530 A.M.  Call this number if you have problems the morning of surgery:  [phone]   Remember:  Do not eat food or drink liquids after midnight.  Take these medicines the morning of surgery with A SIP OF WATER:   OMEPRAZOLE (PRILOSEC), EYE DROPS      (STOP  ASPIRIN, VITAMINS/ HERBAL MEDICINES, TURMERIC, ADVIL/ MOTRIN/ IBUPROFEN, GOODY POWDERS/ BCS)   How to Manage Your Diabetes Before and After Surgery  Why is it important to control my blood sugar before and after surgery? . Improving blood sugar levels before and after surgery helps healing and can limit problems. . A way of improving blood sugar control is eating a healthy diet by: o  Eating less sugar and carbohydrates o  Increasing activity/exercise o  Talking with your doctor about reaching your blood sugar goals . High blood sugars (greater than 180 mg/dL) can raise your risk of infections and slow your recovery, so you will need to focus on controlling your diabetes during the weeks before surgery. . Make sure that the doctor who takes care of your diabetes knows about your planned surgery including the date and location.  How do I manage my blood sugar before surgery? . Check your blood sugar at least 4 times a day, starting 2 days before surgery, to make sure that the level is not too high or low. o Check your blood sugar the morning of your surgery when you wake up and every 2 hours until you get to the Short Stay unit. . If your blood sugar is less than 70 mg/dL, you will need to treat for low  blood sugar: o Do not take insulin. o Treat a low blood sugar (less than 70 mg/dL) with  cup of clear juice (cranberry or apple), 4 glucose tablets, OR glucose gel. o Recheck blood sugar in 15 minutes after treatment (to make sure it is greater than 70 mg/dL). If your blood sugar is not greater than 70 mg/dL on recheck, call [phone] for further instructions. . Report your blood sugar to the short stay nurse when you get to Short Stay.  . If you are admitted to the hospital after surgery: o Your blood sugar will be checked by the staff and you will probably be given insulin after surgery (instead of oral diabetes medicines) to make sure you have good blood sugar levels. o The goal for blood sugar control after surgery is 80-180 mg/dL.              WHAT DO I DO ABOUT MY DIABETES MEDICATION?   [name] Do not take oral diabetes medicines (pills) the morning of surgery.  .       .   T .   Other Instructions:          Patient Signature:  Date:   Nurse Signature:  Date:   Reviewed and Endorsed by [facility] Patient Education Committee, August 2015  Do not wear jewelry, make-up or nail polish.  Do not  wear lotions, powders, or perfumes.  You may wear deodorant.  Do not shave 48 hours prior to surgery.    Do not bring valuables to the hospital.  [facility] is not responsible for any belongings or valuables.  Contacts, dentures or bridgework may not be worn into surgery.  Leave your suitcase in the car.  After surgery it may be brought to your room.  For patients admitted to the hospital, discharge time will be determined by your treatment team.  Patients discharged the day of surgery will not be allowed to drive home.   Name and phone number of your driver:   Special instructions:  SEE PREPARING FOR SURGERY  Please read over the following fact sheets that you were given. Pain Booklet, Coughing and Deep Breathing, MRSA Information and Surgical Site Infection  Prevention

## 2015-07-05 NOTE — Progress Notes (Signed)
Pt. Reports that she had a stress test a long time ago, 10 +yrs., told that it was wnl.  Pt. Followed for PCP by Dr. Lady Deutscher.

## 2015-07-07 LAB — URINE CULTURE

## 2015-07-08 MED ORDER — TRANEXAMIC ACID 1000 MG/10ML IV SOLN
1000.0000 mg | INTRAVENOUS | Status: AC
  Administered 2015-07-09: 1000 mg via INTRAVENOUS
  Filled 2015-07-08: qty 10

## 2015-07-08 MED ORDER — SODIUM CHLORIDE 0.9 % IV SOLN: INTRAVENOUS | Status: DC

## 2015-07-08 MED ORDER — CLINDAMYCIN PHOSPHATE 900 MG/50ML IV SOLN
900.0000 mg | INTRAVENOUS | Status: AC
  Administered 2015-07-09: 900 mg via INTRAVENOUS
  Filled 2015-07-08: qty 50

## 2015-07-08 MED ORDER — ACETAMINOPHEN 500 MG PO TABS
1000.0000 mg | ORAL_TABLET | Freq: Once | ORAL | Status: AC
  Administered 2015-07-09: 1000 mg via ORAL
  Filled 2015-07-08: qty 2

## 2015-07-09 ENCOUNTER — Inpatient Hospital Stay (HOSPITAL_COMMUNITY)
Admission: RE | Admit: 2015-07-09 | Discharge: 2015-07-11 | DRG: 470 | Disposition: A | Discharge status: Home Health | Payer: Medicare Other | Source: Ambulatory Visit | Attending: Orthopedic Surgery | Admitting: Orthopedic Surgery

## 2015-07-09 ENCOUNTER — Encounter (HOSPITAL_COMMUNITY)
Admission: RE | Disposition: A | Discharge status: Home Health | Payer: Self-pay | Source: Ambulatory Visit | Attending: Orthopedic Surgery

## 2015-07-09 ENCOUNTER — Encounter (HOSPITAL_COMMUNITY): Payer: Self-pay | Admitting: *Deleted

## 2015-07-09 ENCOUNTER — Inpatient Hospital Stay (HOSPITAL_COMMUNITY): Payer: Medicare Other | Admitting: Certified Registered"

## 2015-07-09 DIAGNOSIS — Z809 Family history of malignant neoplasm, unspecified: Secondary | ICD-10-CM | POA: Diagnosis not present

## 2015-07-09 DIAGNOSIS — N312 Flaccid neuropathic bladder, not elsewhere classified: Secondary | ICD-10-CM | POA: Diagnosis present

## 2015-07-09 DIAGNOSIS — Z91013 Allergy to seafood: Secondary | ICD-10-CM | POA: Diagnosis not present

## 2015-07-09 DIAGNOSIS — Z79899 Other long term (current) drug therapy: Secondary | ICD-10-CM

## 2015-07-09 DIAGNOSIS — Z87891 Personal history of nicotine dependence: Secondary | ICD-10-CM

## 2015-07-09 DIAGNOSIS — C50412 Malignant neoplasm of upper-outer quadrant of left female breast: Secondary | ICD-10-CM | POA: Diagnosis present

## 2015-07-09 DIAGNOSIS — M1711 Unilateral primary osteoarthritis, right knee: Principal | ICD-10-CM | POA: Diagnosis present

## 2015-07-09 DIAGNOSIS — Z91048 Other nonmedicinal substance allergy status: Secondary | ICD-10-CM

## 2015-07-09 DIAGNOSIS — M179 Osteoarthritis of knee, unspecified: Secondary | ICD-10-CM | POA: Diagnosis not present

## 2015-07-09 DIAGNOSIS — Z7982 Long term (current) use of aspirin: Secondary | ICD-10-CM

## 2015-07-09 DIAGNOSIS — I10 Essential (primary) hypertension: Secondary | ICD-10-CM | POA: Diagnosis present

## 2015-07-09 DIAGNOSIS — K219 Gastro-esophageal reflux disease without esophagitis: Secondary | ICD-10-CM | POA: Diagnosis present

## 2015-07-09 DIAGNOSIS — Z880 Allergy status to penicillin: Secondary | ICD-10-CM

## 2015-07-09 DIAGNOSIS — Z96659 Presence of unspecified artificial knee joint: Secondary | ICD-10-CM

## 2015-07-09 DIAGNOSIS — Z7984 Long term (current) use of oral hypoglycemic drugs: Secondary | ICD-10-CM | POA: Diagnosis not present

## 2015-07-09 DIAGNOSIS — M25561 Pain in right knee: Secondary | ICD-10-CM | POA: Diagnosis not present

## 2015-07-09 HISTORY — PX: TOTAL KNEE ARTHROPLASTY: SHX125

## 2015-07-09 LAB — CBC
HEMATOCRIT: 36.9 % (ref 36.0–46.0)
HEMOGLOBIN: 11.8 g/dL — AB (ref 12.0–15.0)
MCH: 29.2 pg (ref 26.0–34.0)
MCHC: 32 g/dL (ref 30.0–36.0)
MCV: 91.3 fL (ref 78.0–100.0)
Platelets: 173 10*3/uL (ref 150–400)
RBC: 4.04 MIL/uL (ref 3.87–5.11)
RDW: 14.3 % (ref 11.5–15.5)
WBC: 6.2 10*3/uL (ref 4.0–10.5)

## 2015-07-09 LAB — CREATININE, SERUM
CREATININE: 0.83 mg/dL (ref 0.44–1.00)
GFR calc Af Amer: >60 mL/min (ref >60)

## 2015-07-09 LAB — GLUCOSE, CAPILLARY
Glucose-Capillary: 111 mg/dL — ABNORMAL HIGH (ref 65–99)
Glucose-Capillary: 103 mg/dL — ABNORMAL HIGH (ref 65–99)

## 2015-07-09 SURGERY — ARTHROPLASTY, KNEE, TOTAL
Anesthesia: Spinal | Site: Knee | Laterality: Right

## 2015-07-09 MED ORDER — FENTANYL CITRATE (PF) 100 MCG/2ML IJ SOLN
INTRAMUSCULAR | Status: DC | PRN
  Administered 2015-07-09: 100 ug via INTRAVENOUS

## 2015-07-09 MED ORDER — BUPIVACAINE-EPINEPHRINE (PF) 0.25% -1:200000 IJ SOLN
INTRAMUSCULAR | Status: AC
  Filled 2015-07-09: qty 30

## 2015-07-09 MED ORDER — PHENYLEPHRINE HCL 10 MG/ML IJ SOLN
INTRAMUSCULAR | Status: DC | PRN
  Administered 2015-07-09 (×4): 40 ug via INTRAVENOUS

## 2015-07-09 MED ORDER — SODIUM CHLORIDE 0.9 % IV SOLN
INTRAVENOUS | Status: DC
  Administered 2015-07-09: 13:00:00 via INTRAVENOUS

## 2015-07-09 MED ORDER — PROPOFOL 10 MG/ML IV BOLUS
INTRAVENOUS | Status: DC | PRN
  Administered 2015-07-09: 15 mg via INTRAVENOUS
  Administered 2015-07-09: 20 mg via INTRAVENOUS

## 2015-07-09 MED ORDER — SODIUM CHLORIDE 0.9 % IJ SOLN
INTRAMUSCULAR | Status: DC | PRN
  Administered 2015-07-09 (×2): 10 mL

## 2015-07-09 MED ORDER — INSULIN ASPART 100 UNIT/ML ~~LOC~~ SOLN
0.0000 [IU] | Freq: Three times a day (TID) | SUBCUTANEOUS | Status: DC
  Administered 2015-07-10 (×2): 2 [IU] via SUBCUTANEOUS
  Administered 2015-07-11 (×2): 3 [IU] via SUBCUTANEOUS

## 2015-07-09 MED ORDER — BUPIVACAINE IN DEXTROSE 0.75-8.25 % IT SOLN
INTRATHECAL | Status: DC | PRN
  Administered 2015-07-09: 2 mL via INTRATHECAL

## 2015-07-09 MED ORDER — PHENYLEPHRINE 40 MCG/ML (10ML) SYRINGE FOR IV PUSH (FOR BLOOD PRESSURE SUPPORT)
PREFILLED_SYRINGE | INTRAVENOUS | Status: AC
  Filled 2015-07-09: qty 10

## 2015-07-09 MED ORDER — HYDROMORPHONE HCL 1 MG/ML IJ SOLN
1.0000 mg | INTRAMUSCULAR | Status: DC | PRN
  Administered 2015-07-10: 1 mg via INTRAVENOUS
  Filled 2015-07-09: qty 1

## 2015-07-09 MED ORDER — CELECOXIB 200 MG PO CAPS
200.0000 mg | ORAL_CAPSULE | Freq: Two times a day (BID) | ORAL | Status: DC
  Administered 2015-07-09 – 2015-07-11 (×5): 200 mg via ORAL
  Filled 2015-07-09 (×5): qty 1

## 2015-07-09 MED ORDER — FENTANYL CITRATE (PF) 250 MCG/5ML IJ SOLN
INTRAMUSCULAR | Status: AC
  Filled 2015-07-09: qty 5

## 2015-07-09 MED ORDER — MIDAZOLAM HCL 5 MG/5ML IJ SOLN
INTRAMUSCULAR | Status: DC | PRN
  Administered 2015-07-09: 2 mg via INTRAVENOUS

## 2015-07-09 MED ORDER — DOCUSATE SODIUM 100 MG PO CAPS
100.0000 mg | ORAL_CAPSULE | Freq: Two times a day (BID) | ORAL | Status: DC
  Administered 2015-07-09 – 2015-07-11 (×5): 100 mg via ORAL
  Filled 2015-07-09 (×5): qty 1

## 2015-07-09 MED ORDER — BUPIVACAINE-EPINEPHRINE (PF) 0.5% -1:200000 IJ SOLN
INTRAMUSCULAR | Status: AC
  Filled 2015-07-09: qty 30

## 2015-07-09 MED ORDER — ACETAMINOPHEN 650 MG RE SUPP: 650.0000 mg | Freq: Four times a day (QID) | RECTAL | Status: DC | PRN

## 2015-07-09 MED ORDER — METHOCARBAMOL 500 MG PO TABS
500.0000 mg | ORAL_TABLET | Freq: Four times a day (QID) | ORAL | Status: DC | PRN
  Filled 2015-07-09 (×2): qty 1

## 2015-07-09 MED ORDER — METOCLOPRAMIDE HCL 5 MG/ML IJ SOLN: 5.0000 mg | Freq: Three times a day (TID) | INTRAMUSCULAR | Status: DC | PRN

## 2015-07-09 MED ORDER — LIDOCAINE HCL (CARDIAC) 20 MG/ML IV SOLN
INTRAVENOUS | Status: DC | PRN
  Administered 2015-07-09: 40 mg via INTRAVENOUS

## 2015-07-09 MED ORDER — PROPOFOL 500 MG/50ML IV EMUL
INTRAVENOUS | Status: DC | PRN
  Administered 2015-07-09: 25 ug/kg/min via INTRAVENOUS

## 2015-07-09 MED ORDER — ONDANSETRON HCL 4 MG PO TABS: 4.0000 mg | ORAL_TABLET | Freq: Four times a day (QID) | ORAL | Status: DC | PRN

## 2015-07-09 MED ORDER — PROPOFOL 10 MG/ML IV BOLUS
INTRAVENOUS | Status: AC
  Filled 2015-07-09: qty 20

## 2015-07-09 MED ORDER — BUPIVACAINE-EPINEPHRINE (PF) 0.25% -1:200000 IJ SOLN
INTRAMUSCULAR | Status: DC | PRN
  Administered 2015-07-09: 20 mL via PERINEURAL

## 2015-07-09 MED ORDER — ACETAMINOPHEN 325 MG PO TABS: 650.0000 mg | ORAL_TABLET | Freq: Four times a day (QID) | ORAL | Status: DC | PRN

## 2015-07-09 MED ORDER — CHLORHEXIDINE GLUCONATE 4 % EX LIQD: 60.0000 mL | Freq: Once | CUTANEOUS | Status: DC

## 2015-07-09 MED ORDER — PANTOPRAZOLE SODIUM 40 MG PO TBEC
40.0000 mg | DELAYED_RELEASE_TABLET | Freq: Every day | ORAL | Status: DC
  Administered 2015-07-09 – 2015-07-11 (×3): 40 mg via ORAL
  Filled 2015-07-09 (×3): qty 1

## 2015-07-09 MED ORDER — METHOCARBAMOL 1000 MG/10ML IJ SOLN
500.0000 mg | Freq: Four times a day (QID) | INTRAMUSCULAR | Status: DC | PRN
  Filled 2015-07-09: qty 5

## 2015-07-09 MED ORDER — LIDOCAINE 2% (20 MG/ML) 5 ML SYRINGE
INTRAMUSCULAR | Status: AC
  Filled 2015-07-09: qty 5

## 2015-07-09 MED ORDER — EPHEDRINE SULFATE 50 MG/ML IJ SOLN
INTRAMUSCULAR | Status: DC | PRN
  Administered 2015-07-09 (×2): 5 mg via INTRAVENOUS
  Administered 2015-07-09: 10 mg via INTRAVENOUS

## 2015-07-09 MED ORDER — PHENOL 1.4 % MT LIQD: 1.0000 | OROMUCOSAL | Status: DC | PRN

## 2015-07-09 MED ORDER — METFORMIN HCL ER 500 MG PO TB24
500.0000 mg | ORAL_TABLET | Freq: Every day | ORAL | Status: DC
  Administered 2015-07-09 – 2015-07-11 (×3): 500 mg via ORAL
  Filled 2015-07-09 (×3): qty 1

## 2015-07-09 MED ORDER — BISACODYL 5 MG PO TBEC: 5.0000 mg | DELAYED_RELEASE_TABLET | Freq: Every day | ORAL | Status: DC | PRN

## 2015-07-09 MED ORDER — PROPOFOL 500 MG/50ML IV EMUL: INTRAVENOUS | Status: DC | PRN

## 2015-07-09 MED ORDER — BUPIVACAINE LIPOSOME 1.3 % IJ SUSP
INTRAMUSCULAR | Status: DC | PRN
  Administered 2015-07-09: 20 mL

## 2015-07-09 MED ORDER — LACTATED RINGERS IV SOLN
INTRAVENOUS | Status: DC | PRN
  Administered 2015-07-09 (×2): via INTRAVENOUS

## 2015-07-09 MED ORDER — OXYCODONE HCL 5 MG PO TABS
5.0000 mg | ORAL_TABLET | ORAL | Status: DC | PRN
  Administered 2015-07-09 – 2015-07-11 (×5): 10 mg via ORAL
  Filled 2015-07-09 (×5): qty 2

## 2015-07-09 MED ORDER — EPHEDRINE 5 MG/ML INJ
INTRAVENOUS | Status: AC
  Filled 2015-07-09: qty 10

## 2015-07-09 MED ORDER — OXYCODONE HCL ER 10 MG PO T12A
10.0000 mg | EXTENDED_RELEASE_TABLET | Freq: Two times a day (BID) | ORAL | Status: DC
  Administered 2015-07-09 – 2015-07-11 (×5): 10 mg via ORAL
  Filled 2015-07-09 (×5): qty 1

## 2015-07-09 MED ORDER — ENOXAPARIN SODIUM 30 MG/0.3ML ~~LOC~~ SOLN
30.0000 mg | Freq: Two times a day (BID) | SUBCUTANEOUS | Status: DC
  Administered 2015-07-10 – 2015-07-11 (×3): 30 mg via SUBCUTANEOUS
  Filled 2015-07-09 (×4): qty 0.3

## 2015-07-09 MED ORDER — ONDANSETRON HCL 4 MG/2ML IJ SOLN
INTRAMUSCULAR | Status: DC | PRN
  Administered 2015-07-09: 4 mg via INTRAVENOUS

## 2015-07-09 MED ORDER — ONDANSETRON HCL 4 MG/2ML IJ SOLN
INTRAMUSCULAR | Status: AC
  Filled 2015-07-09: qty 2

## 2015-07-09 MED ORDER — TRANEXAMIC ACID 1000 MG/10ML IV SOLN
1000.0000 mg | Freq: Once | INTRAVENOUS | Status: AC
  Administered 2015-07-09: 1000 mg via INTRAVENOUS
  Filled 2015-07-09: qty 10

## 2015-07-09 MED ORDER — 0.9 % SODIUM CHLORIDE (POUR BTL) OPTIME
TOPICAL | Status: DC | PRN
  Administered 2015-07-09: 1000 mL

## 2015-07-09 MED ORDER — BUPIVACAINE LIPOSOME 1.3 % IJ SUSP
20.0000 mL | Freq: Once | INTRAMUSCULAR | Status: DC
  Filled 2015-07-09: qty 20

## 2015-07-09 MED ORDER — MENTHOL 3 MG MT LOZG: 1.0000 | LOZENGE | OROMUCOSAL | Status: DC | PRN

## 2015-07-09 MED ORDER — ZOLPIDEM TARTRATE 5 MG PO TABS: 5.0000 mg | ORAL_TABLET | Freq: Every evening | ORAL | Status: DC | PRN

## 2015-07-09 MED ORDER — SODIUM CHLORIDE 0.9 % IR SOLN
Status: DC | PRN
  Administered 2015-07-09: 1000 mL

## 2015-07-09 MED ORDER — MIDAZOLAM HCL 2 MG/2ML IJ SOLN
INTRAMUSCULAR | Status: AC
  Filled 2015-07-09: qty 2

## 2015-07-09 MED ORDER — METOCLOPRAMIDE HCL 5 MG PO TABS: 5.0000 mg | ORAL_TABLET | Freq: Three times a day (TID) | ORAL | Status: DC | PRN

## 2015-07-09 MED ORDER — DEXTROSE 5 % IV SOLN
10.0000 mg | INTRAVENOUS | Status: DC | PRN
Start: 2015-07-09 — End: 2015-07-09
  Administered 2015-07-09: 20 ug/min via INTRAVENOUS

## 2015-07-09 MED ORDER — CLINDAMYCIN PHOSPHATE 600 MG/50ML IV SOLN
600.0000 mg | Freq: Four times a day (QID) | INTRAVENOUS | Status: AC
  Administered 2015-07-09 (×2): 600 mg via INTRAVENOUS
  Filled 2015-07-09 (×2): qty 50

## 2015-07-09 MED ORDER — FLEET ENEMA 7-19 GM/118ML RE ENEM: 1.0000 | ENEMA | Freq: Once | RECTAL | Status: DC | PRN

## 2015-07-09 MED ORDER — SENNOSIDES-DOCUSATE SODIUM 8.6-50 MG PO TABS: 1.0000 | ORAL_TABLET | Freq: Every evening | ORAL | Status: DC | PRN

## 2015-07-09 MED ORDER — SIMVASTATIN 20 MG PO TABS
20.0000 mg | ORAL_TABLET | Freq: Every day | ORAL | Status: DC
  Administered 2015-07-09 – 2015-07-11 (×3): 20 mg via ORAL
  Filled 2015-07-09 (×3): qty 1

## 2015-07-09 MED ORDER — ONDANSETRON HCL 4 MG/2ML IJ SOLN: 4.0000 mg | Freq: Four times a day (QID) | INTRAMUSCULAR | Status: DC | PRN

## 2015-07-09 MED ORDER — PROPOFOL 1000 MG/100ML IV EMUL
INTRAVENOUS | Status: AC
  Filled 2015-07-09: qty 100

## 2015-07-09 MED ORDER — ALUM & MAG HYDROXIDE-SIMETH 200-200-20 MG/5ML PO SUSP: 30.0000 mL | ORAL | Status: DC | PRN

## 2015-07-09 SURGICAL SUPPLY — 60 items
BANDAGE ESMARK 6X9 LF (GAUZE/BANDAGES/DRESSINGS) ×1 IMPLANT
BLADE SAGITTAL 13X1.27X60 (BLADE) ×2 IMPLANT
BLADE SAW SGTL 83.5X18.5 (BLADE) ×4 IMPLANT
BLADE SURG 10 STRL SS (BLADE) ×2 IMPLANT
BNDG ESMARK 6X9 LF (GAUZE/BANDAGES/DRESSINGS) ×2
BOWL SMART MIX CTS (DISPOSABLE) ×2 IMPLANT
CAPT KNEE TOTAL 3 ATTUNE ×2 IMPLANT
CEMENT BONE SIMPLEX SPEEDSET (Cement) ×4 IMPLANT
COVER SURGICAL LIGHT HANDLE (MISCELLANEOUS) ×2 IMPLANT
CUFF TOURNIQUET SINGLE 34IN LL (TOURNIQUET CUFF) ×2 IMPLANT
DRAPE EXTREMITY T 121X128X90 (DRAPE) ×2 IMPLANT
DRAPE INCISE IOBAN 66X45 STRL (DRAPES) ×4 IMPLANT
DRAPE PROXIMA HALF (DRAPES) IMPLANT
DRAPE U-SHAPE 47X51 STRL (DRAPES) ×2 IMPLANT
DRSG ADAPTIC 3X8 NADH LF (GAUZE/BANDAGES/DRESSINGS) ×2 IMPLANT
DRSG PAD ABDOMINAL 8X10 ST (GAUZE/BANDAGES/DRESSINGS) ×2 IMPLANT
DURAPREP 26ML APPLICATOR (WOUND CARE) ×4 IMPLANT
ELECT REM PT RETURN 9FT ADLT (ELECTROSURGICAL) ×2
ELECTRODE REM PT RTRN 9FT ADLT (ELECTROSURGICAL) ×1 IMPLANT
GAUZE SPONGE 4X4 12PLY STRL (GAUZE/BANDAGES/DRESSINGS) ×2 IMPLANT
GLOVE BIOGEL M 7.0 STRL (GLOVE) IMPLANT
GLOVE BIOGEL PI IND STRL 7.5 (GLOVE) IMPLANT
GLOVE BIOGEL PI IND STRL 8.5 (GLOVE) ×5 IMPLANT
GLOVE BIOGEL PI INDICATOR 7.5 (GLOVE)
GLOVE BIOGEL PI INDICATOR 8.5 (GLOVE) ×5
GLOVE SURG ORTHO 8.0 STRL STRW (GLOVE) ×12 IMPLANT
GOWN STRL REUS W/ TWL LRG LVL3 (GOWN DISPOSABLE) ×1 IMPLANT
GOWN STRL REUS W/ TWL XL LVL3 (GOWN DISPOSABLE) ×2 IMPLANT
GOWN STRL REUS W/TWL 2XL LVL3 (GOWN DISPOSABLE) ×2 IMPLANT
GOWN STRL REUS W/TWL LRG LVL3 (GOWN DISPOSABLE) ×1
GOWN STRL REUS W/TWL XL LVL3 (GOWN DISPOSABLE) ×2
HANDPIECE INTERPULSE COAX TIP (DISPOSABLE) ×2
HOOD PEEL AWAY FACE SHEILD DIS (HOOD) ×6 IMPLANT
KIT BASIN OR (CUSTOM PROCEDURE TRAY) ×2 IMPLANT
KIT ROOM TURNOVER OR (KITS) ×2 IMPLANT
MANIFOLD NEPTUNE II (INSTRUMENTS) ×2 IMPLANT
NEEDLE 22X1 1/2 (OR ONLY) (NEEDLE) ×4 IMPLANT
NS IRRIG 1000ML POUR BTL (IV SOLUTION) ×2 IMPLANT
PACK TOTAL JOINT (CUSTOM PROCEDURE TRAY) ×2 IMPLANT
PACK UNIVERSAL I (CUSTOM PROCEDURE TRAY) ×2 IMPLANT
PAD ABD 8X10 STRL (GAUZE/BANDAGES/DRESSINGS) ×2 IMPLANT
PAD ARMBOARD 7.5X6 YLW CONV (MISCELLANEOUS) ×4 IMPLANT
PADDING CAST ABS 6INX4YD NS (CAST SUPPLIES) ×1
PADDING CAST ABS COTTON 6X4 NS (CAST SUPPLIES) ×1 IMPLANT
PADDING CAST COTTON 6X4 STRL (CAST SUPPLIES) ×2 IMPLANT
SET HNDPC FAN SPRY TIP SCT (DISPOSABLE) ×1 IMPLANT
SPONGE GAUZE 4X4 12PLY STER LF (GAUZE/BANDAGES/DRESSINGS) ×2 IMPLANT
STAPLER VISISTAT 35W (STAPLE) ×2 IMPLANT
SUCTION FRAZIER HANDLE 10FR (MISCELLANEOUS) ×1
SUCTION TUBE FRAZIER 10FR DISP (MISCELLANEOUS) ×1 IMPLANT
SUT BONE WAX W31G (SUTURE) ×2 IMPLANT
SUT VIC AB 0 CTB1 27 (SUTURE) ×4 IMPLANT
SUT VIC AB 1 CT1 27 (SUTURE) ×2
SUT VIC AB 1 CT1 27XBRD ANBCTR (SUTURE) ×2 IMPLANT
SUT VIC AB 2-0 CT1 27 (SUTURE) ×4
SUT VIC AB 2-0 CT1 TAPERPNT 27 (SUTURE) ×2 IMPLANT
SYR 20CC LL (SYRINGE) ×4 IMPLANT
TOWEL OR 17X24 6PK STRL BLUE (TOWEL DISPOSABLE) ×2 IMPLANT
TOWEL OR 17X26 10 PK STRL BLUE (TOWEL DISPOSABLE) ×2 IMPLANT
WATER STERILE IRR 1000ML POUR (IV SOLUTION) ×4 IMPLANT

## 2015-07-09 NOTE — H&P (Signed)
[name] MRN:  [id number] DOB/SEX:  [birth year]/female  CHIEF COMPLAINT:  Painful right Knee  HISTORY: Patient is a 82 y.o. female presented with a history of pain in the right knee. Onset of symptoms was gradual starting a few years ago with gradually worsening course since that time. Patient has been treated conservatively with over-the-counter NSAIDs and activity modification. Patient currently rates pain in the knee at 10 out of 10 with activity. There is pain at night.  PAST MEDICAL HISTORY: Patient Active Problem List   Diagnosis Date Noted  . Breast cancer of upper-outer quadrant of left female breast ([place]) 03/02/2013  . Bilateral wrist pain 10/29/2012   Past Medical History  Diagnosis Date  . 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 Thyroid nodule   . Arthritis     knees  . Normal cardiac stress test 2000    done at [facility]  . Hypertension     for treatment, pt. unsure why this is noted in her hx.    . Thyroid nodule 2007    biopsy- benign  . GERD (gastroesophageal reflux disease)     Uses PPI- only on occasion   . Bladder atony     uses a large pad for incontinence   . History of hiatal hernia    Past Surgical History  Procedure Laterality Date  . Tonsillectomy  1944  . Adenoidectomy    . Breast biopsy  1974    right  . Breast biopsy  1977    left  . Breast lumpectomy w/ needle localization  01/22/2011    Left - Dr Margot Chimes  . Breast lumpectomy  01/22/2011    Procedure: LUMPECTOMY;  Surgeon: Haywood Lasso, MD;  Location: [place];  Service: General;  Laterality: Left;  Needle localization  @ [facility]  12:30  . Colonoscopy  2009  . Vaginal hysterectomy      partial  . Eye surgery Left     laser for glaucoma     MEDICATIONS:   Prescriptions prior to admission  Medication Sig Dispense Refill Last Dose  . aspirin EC 81 MG tablet Take 81 mg by mouth daily.   Past Month at Unknown time  . B Complex-C (SUPER B COMPLEX PO) Take 1 tablet by mouth daily.    Past Week at  Unknown time  . Bioflavonoid Products (GRAPE SEED PO) Take 1 tablet by mouth daily. Grape seed 50mg  & magnesium 15mg  OTC    Past Week at Unknown time  . cholecalciferol (VITAMIN D) 1000 units tablet Take 1,000 Units by mouth daily.   Past Month at Unknown time  . Coenzyme Q10 (COQ-10 PO) Take 300 mg by mouth daily.    Past Week at Unknown time  . CRANBERRY PO Take 84 mg by mouth daily.    Past Week at Unknown time  . Flaxseed, Linseed, ([employer] FLAXSEED OIL PO) Take 1,400 mg by mouth daily.   Past Week at Unknown time  . Garlic 123XX123 MG CAPS Take 1,000 mg by mouth daily.     Past Month at Unknown time  . metFORMIN (GLUCOPHAGE-XR) 500 MG 24 hr tablet Take 500 mg by mouth daily after supper.    Past Week at Unknown time  . Multiple Vitamins-Minerals (MULTIVITAMIN WITH MINERALS) tablet Take 1 tablet by mouth daily.    Past Week at Unknown time  . omega-3 acid ethyl esters (LOVAZA) 1 G capsule Take 1 g by mouth daily.     Past Week at Unknown  time  . Omega-3 Fatty Acids (EQL OMEGA 3 FISH OIL) 1200 MG CAPS Take 1 capsule by mouth daily.   Past Week at Unknown time  . omeprazole (PRILOSEC) 10 MG capsule Take 10 mg by mouth daily as needed.    Past Week at Unknown time  . simvastatin (ZOCOR) 20 MG tablet Take 20 mg by mouth daily after supper.    Past Week at Unknown time  . travoprost, benzalkonium, (TRAVATAN) 0.004 % ophthalmic solution Place 1 drop into both eyes at bedtime. 5% solution per patient   07/08/2015 at Unknown time  . Turmeric Curcumin 500 MG CAPS Take 1 capsule by mouth daily. On hold   Past Month at Unknown time  . vitamin C (ASCORBIC ACID) 500 MG tablet Take 1,000 mg by mouth daily.     Past Week at Unknown time    ALLERGIES:   Allergies  Allergen Reactions  . Penicillins Hives  . Shellfish Allergy Hives    Shrimp only  . Tape Rash    REVIEW OF SYSTEMS:  A comprehensive review of systems was negative except for: Musculoskeletal: positive for arthralgias   FAMILY HISTORY:   Family  History  Problem Relation Age of Onset  . Cancer Paternal Aunt   . Cancer Paternal Uncle 76    colon  . Cancer Maternal Grandmother 85    breast    SOCIAL HISTORY:   Social History  Substance Use Topics  . Smoking status: Former Smoker    Quit date: 03/10/1981  . Smokeless tobacco: Never Used  . Alcohol Use: No     EXAMINATION:  Vital signs in last 24 hours: Temp:  [97.9 F (36.6 C)] 97.9 F (36.6 C) (05/01 0620) Pulse Rate:  [95] 95 (05/01 0620) Resp:  [18] 18 (05/01 0620) BP: (167)/(65) 167/65 mmHg (05/01 0620) SpO2:  [98 %] 98 % (05/01 0620) Weight:  [97.07 kg (214 lb)] 97.07 kg (214 lb) (05/01 0620)  BP 167/65 mmHg  Pulse 95  Temp(Src) 97.9 F (36.6 C) (Oral)  Resp 18  Ht 5\' 3"  (1.6 m)  Wt 97.07 kg (214 lb)  BMI 37.92 kg/m2  SpO2 98%  General Appearance:    Alert, cooperative, no distress, appears stated age  Head:    Normocephalic, without obvious abnormality, atraumatic  Eyes:    PERRL, conjunctiva/corneas clear, EOM's intact, fundi    benign, both eyes  Ears:    Normal TM's and external ear canals, both ears  Nose:   Nares normal, septum midline, mucosa normal, no drainage    or sinus tenderness  Throat:   Lips, mucosa, and tongue normal; teeth and gums normal  Neck:   Supple, symmetrical, trachea midline, no adenopathy;    thyroid:  no enlargement/tenderness/nodules; no carotid   bruit or JVD  Back:     Symmetric, no curvature, ROM normal, no CVA tenderness  Lungs:     Clear to auscultation bilaterally, respirations unlabored  Chest Wall:    No tenderness or deformity   Heart:    Regular rate and rhythm, S1 and S2 normal, no murmur, rub   or gallop  Breast Exam:    No tenderness, masses, or nipple abnormality  Abdomen:     Soft, non-tender, bowel sounds active all four quadrants,    no masses, no organomegaly  Genitalia:    Normal female without lesion, discharge or tenderness  Rectal:    Normal tone, normal prostate, no masses or tenderness;    guaiac negative stool  Extremities:   Extremities normal, atraumatic, no cyanosis or edema  Pulses:   2+ and symmetric all extremities  Skin:   Skin color, texture, turgor normal, no rashes or lesions  Lymph nodes:   Cervical, supraclavicular, and axillary nodes normal  Neurologic:   CNII-XII intact, normal strength, sensation and reflexes    throughout    Musculoskeletal:  ROM 0-120, Ligaments intact,  Imaging Review Plain radiographs demonstrate severe degenerative joint disease of the right knee. The overall alignment is neutral. The bone quality appears to be excellent for age and reported activity level.  Assessment/Plan: Primary osteoarthritis, right knee   The patient history, physical examination and imaging studies are consistent with advanced degenerative joint disease of the right knee. The patient has failed conservative treatment.  The clearance notes were reviewed.  After discussion with the patient it was felt that Total Knee Replacement was indicated. The procedure,  risks, and benefits of total knee arthroplasty were presented and reviewed. The risks including but not limited to aseptic loosening, infection, blood clots, vascular injury, stiffness, patella tracking problems complications among others were discussed. The patient acknowledged the explanation, agreed to proceed with the plan.  Donia Ast 07/09/2015, 6:34 AM

## 2015-07-09 NOTE — Anesthesia Procedure Notes (Signed)
Spinal Patient location during procedure: OR Start time: 07/09/2015 7:42 AM End time: 07/09/2015 7:46 AM Staffing Anesthesiologist: Lillia Abed Performed by: anesthesiologist  Preanesthetic Checklist Completed: patient identified, site marked, surgical consent, pre-op evaluation, timeout performed, IV checked, risks and benefits discussed and monitors and equipment checked Spinal Block Patient position: sitting Prep: Betadine Patient monitoring: heart rate, cardiac monitor, continuous pulse ox and blood pressure Approach: right paramedian Location: L3-4 Injection technique: single-shot Needle Needle type: Pencan  Needle gauge: 24 G Needle length: 9 cm Needle insertion depth: 6 cm

## 2015-07-09 NOTE — Evaluation (Signed)
Physical Therapy Evaluation Patient Details Name: [name] MRN: [id number] DOB: August 16, 1931 Today's Date: 07/09/2015   History of Present Illness  Pt is an 82 y/o female who presents s/p elective R TKA on 07/09/15.  Clinical Impression  This patient presents with acute pain and decreased functional independence following the above mentioned procedure. At the time of PT eval, pt was able to perform transfers and ambulation with gross min assist and increased time. This patient is appropriate for skilled PT interventions to address functional limitations, improve safety and independence with functional mobility, and return to PLOF.     Follow Up Recommendations Home health PT;Supervision for mobility/OOB    Equipment Recommendations  3in1 (PT)    Recommendations for Other Services       Precautions / Restrictions Precautions Precautions: Fall;Knee Precaution Comments: Pt was educated on NO pillow/roll under knee and towel roll or bone foam under heel Restrictions Weight Bearing Restrictions: Yes RLE Weight Bearing: Weight bearing as tolerated      Mobility  Bed Mobility Overal bed mobility: Needs Assistance Bed Mobility: Supine to Sit     Supine to sit: Min assist     General bed mobility comments: VC's for sequencing and technique. Min assist to advance RLE to EOB. Pt able to successfully scoot herself out to EOB without assistance.   Transfers Overall transfer level: Needs assistance Equipment used: Rolling walker (2 wheeled) Transfers: Sit to/from Stand Sit to Stand: Min assist         General transfer comment: Assist to power-up to full standing position. Increased time required.   Ambulation/Gait Ambulation/Gait assistance: Min assist Ambulation Distance (Feet): 7 Feet Assistive device: Rolling walker (2 wheeled) Gait Pattern/deviations: Step-to pattern;Decreased stride length;Trunk flexed Gait velocity: Decreased Gait velocity interpretation: Below normal  speed for age/gender General Gait Details: VC's for sequencing with the RW. Pt required assist for balance support and advancing walker.   Stairs            Wheelchair Mobility    Modified Rankin (Stroke Patients Only)       Balance Overall balance assessment: Needs assistance Sitting-balance support: Feet supported;No upper extremity supported Sitting balance-Leahy Scale: Fair     Standing balance support: Bilateral upper extremity supported;During functional activity Standing balance-Leahy Scale: Poor                               Pertinent Vitals/Pain Pain Assessment: 0-10 Pain Score: 10-Worst pain ever Faces Pain Scale: Hurts little more Pain Location: At rest pt does not appear to be in distress but rates pain 10/10 in operative knee. Pain Descriptors / Indicators: Operative site guarding;Aching Pain Intervention(s): Limited activity within patient's tolerance;Monitored during session;Repositioned    Home Living Family/patient expects to be discharged to:: Private residence Living Arrangements: Children Available Help at Discharge: Family;Available 24 hours/day Type of Home: House Home Access: Stairs to enter Entrance Stairs-Rails: Right;Left Entrance Stairs-Number of Steps: 2 in the front, 5-6 on the side of the house.  Home Layout: One level Home Equipment: Walker - 2 wheels;Cane - single point      Prior Function Level of Independence: Independent               Hand Dominance   Dominant Hand: Left    Extremity/Trunk Assessment   Upper Extremity Assessment: Defer to OT evaluation           Lower Extremity Assessment: RLE deficits/detail RLE Deficits /  Details: Decreased strength and AROM consistent with above mentioned procedure.     Cervical / Trunk Assessment: Other exceptions  Communication   Communication: No difficulties  Cognition Arousal/Alertness: Awake/alert Behavior During Therapy: WFL for tasks  assessed/performed Overall Cognitive Status: Within Functional Limits for tasks assessed                      General Comments      Exercises Total Joint Exercises Ankle Circles/Pumps: 20 reps Quad Sets: 10 reps      Assessment/Plan    PT Assessment Patient needs continued PT services  PT Diagnosis Difficulty walking;Acute pain   PT Problem List Decreased strength;Decreased range of motion;Decreased activity tolerance;Decreased balance;Decreased mobility;Decreased knowledge of use of DME;Decreased safety awareness;Decreased knowledge of precautions;Pain  PT Treatment Interventions DME instruction;Gait training;Stair training;Functional mobility training;Therapeutic activities;Therapeutic exercise;Neuromuscular re-education;Patient/family education   PT Goals (Current goals can be found in the Care Plan section) Acute Rehab PT Goals Patient Stated Goal: Return home at d/c PT Goal Formulation: With patient/family Time For Goal Achievement: 07/16/15 Potential to Achieve Goals: Good    Frequency 7X/week   Barriers to discharge        Co-evaluation               End of Session Equipment Utilized During Treatment: Gait belt;Oxygen Activity Tolerance: Patient tolerated treatment well Patient left: in chair;with chair alarm set;with call bell/phone within reach;with family/visitor present Nurse Communication: Mobility status         Time: 1341-1417 PT Time Calculation (min) (ACUTE ONLY): 36 min   Charges:   PT Evaluation $PT Eval Moderate Complexity: 1 Procedure PT Treatments $Gait Training: 8-22 mins   PT G Codes:        Rolinda Roan 2015/08/07, 2:30 PM   Rolinda Roan, PT, DPT Acute Rehabilitation Services Pager: [phone]

## 2015-07-09 NOTE — Anesthesia Preprocedure Evaluation (Signed)
Anesthesia Evaluation  Patient identified by MRN, date of birth, ID band Patient awake    Reviewed: Allergy & Precautions, NPO status , Patient's Chart, lab work & pertinent test results  Airway Mallampati: I  TM Distance: >3 FB Neck ROM: Full    Dental   Pulmonary former smoker,    Pulmonary exam normal        Cardiovascular hypertension, Pt. on medications Normal cardiovascular exam     Neuro/Psych    GI/Hepatic GERD  Controlled and Medicated,  Endo/Other    Renal/GU      Musculoskeletal   Abdominal   Peds  Hematology   Anesthesia Other Findings   Reproductive/Obstetrics                             Anesthesia Physical Anesthesia Plan  ASA: II  Anesthesia Plan: Spinal   Post-op Pain Management:    Induction: Intravenous  Airway Management Planned: Natural Airway  Additional Equipment:   Intra-op Plan:   Post-operative Plan:   Informed Consent: I have reviewed the patients History and Physical, chart, labs and discussed the procedure including the risks, benefits and alternatives for the proposed anesthesia with the patient or authorized representative who has indicated his/her understanding and acceptance.     Plan Discussed with: CRNA and Surgeon  Anesthesia Plan Comments:         Anesthesia Quick Evaluation

## 2015-07-09 NOTE — Anesthesia Postprocedure Evaluation (Signed)
Anesthesia Post Note  Patient: [name]  Procedure(s) Performed: Procedure(s) (LRB): RIGHT TOTAL KNEE ARTHROPLASTY (Right)  Patient location during evaluation: PACU Anesthesia Type: Spinal and MAC Level of consciousness: awake and alert Pain management: pain level controlled Vital Signs Assessment: post-procedure vital signs reviewed and stable Respiratory status: spontaneous breathing and respiratory function stable Cardiovascular status: blood pressure returned to baseline and stable Postop Assessment: spinal receding Anesthetic complications: no    Last Vitals:  Filed Vitals:   07/09/15 1207 07/09/15 1242  BP:  120/57  Pulse:  67  Temp: 36.7 C 37.1 C  Resp:  18    Last Pain:  Filed Vitals:   07/09/15 1303  PainSc: 6                  Indyah Saulnier DAVID

## 2015-07-09 NOTE — Transfer of Care (Signed)
Immediate Anesthesia Transfer of Care Note  Patient: [name]  Procedure(s) Performed: Procedure(s): RIGHT TOTAL KNEE ARTHROPLASTY (Right)  Patient Location: PACU  Anesthesia Type:Spinal  Level of Consciousness: awake, alert , oriented and patient cooperative  Airway & Oxygen Therapy: Patient Spontanous Breathing and Patient connected to face mask oxygen  Post-op Assessment: Report given to RN and Post -op Vital signs reviewed and stable  Post vital signs: Reviewed and stable  Last Vitals:  Filed Vitals:   07/09/15 0930 07/09/15 0933  BP:  94/48  Pulse:  68  Temp: 36.9 C   Resp:  14    Last Pain: There were no vitals filed for this visit.    Patients Stated Pain Goal: 2 ([id number])  Complications: No apparent anesthesia complications

## 2015-07-09 NOTE — Progress Notes (Signed)
Orthopedic Tech Progress Note Patient Details:  [name] October 15, 1931 [id number]  CPM Right Knee CPM Right Knee: On Right Knee Flexion (Degrees): 90 Right Knee Extension (Degrees): 0 Additional Comments: Trapeze bar and foot roll   [name] 07/09/2015, 5:53 PM

## 2015-07-09 NOTE — Progress Notes (Signed)
Orthopedic Tech Progress Note Patient Details:  [name] 08/30/1931 [id number]  CPM Right Knee CPM Right Knee: On Right Knee Flexion (Degrees): 90 Right Knee Extension (Degrees): 0 Additional Comments: Trapeze bar and foot roll   [name] 07/09/2015, 11:32 AM

## 2015-07-10 ENCOUNTER — Encounter (HOSPITAL_COMMUNITY): Payer: Self-pay | Admitting: Orthopedic Surgery

## 2015-07-10 LAB — BASIC METABOLIC PANEL
ANION GAP: 6 (ref 5–15)
BUN: 13 mg/dL (ref 6–20)
CHLORIDE: 108 mmol/L (ref 101–111)
CO2: 23 mmol/L (ref 22–32)
Calcium: 8.6 mg/dL — ABNORMAL LOW (ref 8.9–10.3)
Creatinine, Ser: 0.79 mg/dL (ref 0.44–1.00)
GFR calc Af Amer: >60 mL/min (ref >60)
GFR calc non Af Amer: >60 mL/min (ref >60)
GLUCOSE: 147 mg/dL — AB (ref 65–99)
POTASSIUM: 4 mmol/L (ref 3.5–5.1)
Sodium: 137 mmol/L (ref 135–145)

## 2015-07-10 LAB — CBC
HCT: 36.7 % (ref 36.0–46.0)
HEMOGLOBIN: 11.8 g/dL — AB (ref 12.0–15.0)
MCH: 29.4 pg (ref 26.0–34.0)
MCHC: 32.2 g/dL (ref 30.0–36.0)
MCV: 91.3 fL (ref 78.0–100.0)
PLATELETS: 184 10*3/uL (ref 150–400)
RBC: 4.02 MIL/uL (ref 3.87–5.11)
RDW: 14.4 % (ref 11.5–15.5)
WBC: 6.9 10*3/uL (ref 4.0–10.5)

## 2015-07-10 LAB — GLUCOSE, CAPILLARY
GLUCOSE-CAPILLARY: 152 mg/dL — AB (ref 65–99)
GLUCOSE-CAPILLARY: 142 mg/dL — AB (ref 65–99)
Glucose-Capillary: 145 mg/dL — ABNORMAL HIGH (ref 65–99)
Glucose-Capillary: 117 mg/dL — ABNORMAL HIGH (ref 65–99)
Glucose-Capillary: 143 mg/dL — ABNORMAL HIGH (ref 65–99)

## 2015-07-10 LAB — HEMOGLOBIN A1C
Hgb A1c MFr Bld: 6.3 % — ABNORMAL HIGH (ref 4.8–5.6)
MEAN PLASMA GLUCOSE: 134 mg/dL

## 2015-07-10 MED ORDER — ONDANSETRON HCL 4 MG PO TABS: 4.0000 mg | ORAL_TABLET | Freq: Four times a day (QID) | ORAL | Status: DC | PRN

## 2015-07-10 MED ORDER — OXYCODONE HCL 5 MG PO TABS: 5.0000 mg | ORAL_TABLET | ORAL | Status: DC | PRN

## 2015-07-10 MED ORDER — METHOCARBAMOL 500 MG PO TABS: 500.0000 mg | ORAL_TABLET | Freq: Four times a day (QID) | ORAL | Status: DC | PRN

## 2015-07-10 MED ORDER — OXYCODONE HCL ER 10 MG PO T12A: 10.0000 mg | EXTENDED_RELEASE_TABLET | Freq: Two times a day (BID) | ORAL | Status: DC

## 2015-07-10 MED ORDER — ENOXAPARIN SODIUM 40 MG/0.4ML ~~LOC~~ SOLN: 40.0000 mg | SUBCUTANEOUS | Status: DC

## 2015-07-10 NOTE — Discharge Summary (Signed)
SPORTS MEDICINE & JOINT REPLACEMENT   Lara Mulch, MD    Carlyon Shadow, PA-C [place], [place], [place]  [address]                             [phone]  PATIENT ID: [name]        MRN:  [id number]          DOB/AGE: Dec 16, 1931 / 82 y.o.    DISCHARGE SUMMARY  ADMISSION DATE:    07/09/2015 DISCHARGE DATE:   07/10/2015   ADMISSION DIAGNOSIS: primary osteoarthritis right knee    DISCHARGE DIAGNOSIS:  primary osteoarthritis right knee    ADDITIONAL DIAGNOSIS: Active Problems:   S/P total knee replacement  Past Medical History  Diagnosis Date  . 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 Thyroid nodule   . Arthritis     knees  . Normal cardiac stress test 2000    done at [facility]  . Hypertension     for treatment, pt. unsure why this is noted in her hx.    . Thyroid nodule 2007    biopsy- benign  . GERD (gastroesophageal reflux disease)     Uses PPI- only on occasion   . Bladder atony     uses a large pad for incontinence   . History of hiatal hernia     PROCEDURE: Procedure(s): RIGHT TOTAL KNEE ARTHROPLASTY on 07/09/2015  CONSULTS:     HISTORY:  See H&P in chart  HOSPITAL COURSE:  VIVIEN VILCHIS is a 82 y.o. admitted on 07/09/2015 and found to have a diagnosis of primary osteoarthritis right knee.  After appropriate laboratory studies were obtained  they were taken to the operating room on 07/09/2015 and underwent Procedure(s): RIGHT TOTAL KNEE ARTHROPLASTY.   They were given perioperative antibiotics:  Anti-infectives    Start     Dose/Rate Route Frequency Ordered Stop   07/09/15 1400  clindamycin (CLEOCIN) IVPB 600 mg     600 mg 100 mL/hr over 30 Minutes Intravenous Every 6 hours 07/09/15 1240 07/09/15 2105   07/09/15 0700  clindamycin (CLEOCIN) IVPB 900 mg     900 mg 100 mL/hr over 30 Minutes Intravenous To ShortStay Surgical 07/08/15 1506 07/09/15 0803    .  Patient given tranexamic acid IV or topical and exparel intra-operatively.  Tolerated the  procedure well.    POD# 1: Vital signs were stable.  Patient denied Chest pain, shortness of breath, or calf pain.  Patient was started on Lovenox 30 mg subcutaneously twice daily at 8am.  Consults to PT, OT, and care management were made.  The patient was weight bearing as tolerated.  CPM was placed on the operative leg 0-90 degrees for 6-8 hours a day. When out of the CPM, patient was placed in the foam block to achieve full extension. Incentive spirometry was taught.  Dressing was changed.       POD #2, Continued  PT for ambulation and exercise program.  IV saline locked.  O2 discontinued.    The remainder of the hospital course was dedicated to ambulation and strengthening.   The patient was discharged on 1 Day Post-Op in  Good condition.  Blood products given:none  DIAGNOSTIC STUDIES: Recent vital signs: Patient Vitals for the past 24 hrs:  BP Temp Temp src Pulse Resp SpO2  07/09/15 2330 (!) 122/50 mmHg 98.4 F (36.9 C) Oral 60 18 98 %  07/09/15 1900 (!) 153/58  mmHg 98.4 F (36.9 C) Oral 60 18 98 %  07/09/15 1242 (!) 120/57 mmHg 98.8 F (37.1 C) - 67 18 98 %       Recent laboratory studies:  Recent Labs  07/05/15 1321 07/09/15 1258 07/10/15 0420  WBC 5.6 6.2 6.9  HGB 13.9 11.8* 11.8*  HCT 44.0 36.9 36.7  PLT 232 173 184    Recent Labs  07/05/15 1321 07/09/15 1258 07/10/15 0420  NA 143  --  137  K 4.1  --  4.0  CL 108  --  108  CO2 24  --  23  BUN 17  --  13  CREATININE 0.99 0.83 0.79  GLUCOSE 119*  --  147*  CALCIUM 9.6  --  8.6*   Lab Results  Component Value Date   INR 1.04 07/05/2015     Recent Radiographic Studies :  No results found.  DISCHARGE INSTRUCTIONS: Discharge Instructions    CPM    Complete by:  As directed   Continuous passive motion machine (CPM):      Use the CPM from 0 to 90 for 4-6 hours per day.      You may increase by 10 per day.  You may break it up into 2 or 3 sessions per day.      Use CPM for 2 weeks or until you are told  to stop.     Call MD / Call 911    Complete by:  As directed   If you experience chest pain or shortness of breath, CALL 911 and be transported to the hospital emergency room.  If you develope a fever above 101 F, pus (white drainage) or increased drainage or redness at the wound, or calf pain, call your surgeon's office.     Change dressing    Complete by:  As directed   Change dressing on tomorrow, then change the dressing daily with sterile 4 x 4 inch gauze dressing and apply TED hose.  You may clean the incision with alcohol prior to redressing.     Constipation Prevention    Complete by:  As directed   Drink plenty of fluids.  Prune juice may be helpful.  You may use a stool softener, such as Colace (over the counter) 100 mg twice a day.  Use MiraLax (over the counter) for constipation as needed.     Diet - low sodium heart healthy    Complete by:  As directed      Discharge instructions    Complete by:  As directed   INSTRUCTIONS AFTER JOINT REPLACEMENT   Remove items at home which could result in a fall. This includes throw rugs or furniture in walking pathways ICE to the affected joint every three hours while awake for 30 minutes at a time, for at least the first 3-5 days, and then as needed for pain and swelling.  Continue to use ice for pain and swelling. You may notice swelling that will progress down to the foot and ankle.  This is normal after surgery.  Elevate your leg when you are not up walking on it.   Continue to use the breathing machine you got in the hospital (incentive spirometer) which will help keep your temperature down.  It is common for your temperature to cycle up and down following surgery, especially at night when you are not up moving around and exerting yourself.  The breathing machine keeps your lungs expanded and your temperature down.  DIET:  As you were doing prior to hospitalization, we recommend a well-balanced diet.  DRESSING / WOUND CARE /  SHOWERING  You may change your dressing 3-5 days after surgery.  Then change the dressing every day with sterile gauze.  Please use good hand washing techniques before changing the dressing.  Do not use any lotions or creams on the incision until instructed by your surgeon.  ACTIVITY  Increase activity slowly as tolerated, but follow the weight bearing instructions below.   No driving for 6 weeks or until further direction given by your physician.  You cannot drive while taking narcotics.  No lifting or carrying greater than 10 lbs. until further directed by your surgeon. Avoid periods of inactivity such as sitting longer than an hour when not asleep. This helps prevent blood clots.  You may return to work once you are authorized by your doctor.     WEIGHT BEARING   Weight bearing as tolerated with assist device (walker, cane, etc) as directed, use it as long as suggested by your surgeon or therapist, typically at least 4-6 weeks.   EXERCISES  Results after joint replacement surgery are often greatly improved when you follow the exercise, range of motion and muscle strengthening exercises prescribed by your doctor. Safety measures are also important to protect the joint from further injury. Any time any of these exercises cause you to have increased pain or swelling, decrease what you are doing until you are comfortable again and then slowly increase them. If you have problems or questions, call your caregiver or physical therapist for advice.   Rehabilitation is important following a joint replacement. After just a few days of immobilization, the muscles of the leg can become weakened and shrink (atrophy).  These exercises are designed to build up the tone and strength of the thigh and leg muscles and to improve motion. Often times heat used for twenty to thirty minutes before working out will loosen up your tissues and help with improving the range of motion but do not use heat for the first  two weeks following surgery (sometimes heat can increase post-operative swelling).   These exercises can be done on a training (exercise) mat, on the floor, on a table or on a bed. Use whatever works the best and is most comfortable for you.    Use music or television while you are exercising so that the exercises are a pleasant break in your day. This will make your life better with the exercises acting as a break in your routine that you can look forward to.   Perform all exercises about fifteen times, three times per day or as directed.  You should exercise both the operative leg and the other leg as well.   Exercises include:   Quad Sets - Tighten up the muscle on the front of the thigh (Quad) and hold for 5-10 seconds.   Straight Leg Raises - With your knee straight (if you were given a brace, keep it on), lift the leg to 60 degrees, hold for 3 seconds, and slowly lower the leg.  Perform this exercise against resistance later as your leg gets stronger.  Leg Slides: Lying on your back, slowly slide your foot toward your buttocks, bending your knee up off the floor (only go as far as is comfortable). Then slowly slide your foot back down until your leg is flat on the floor again.  Angel Wings: Lying on your back spread your legs to the side  as far apart as you can without causing discomfort.  Hamstring Strength:  Lying on your back, push your heel against the floor with your leg straight by tightening up the muscles of your buttocks.  Repeat, but this time bend your knee to a comfortable angle, and push your heel against the floor.  You may put a pillow under the heel to make it more comfortable if necessary.   A rehabilitation program following joint replacement surgery can speed recovery and prevent re-injury in the future due to weakened muscles. Contact your doctor or a physical therapist for more information on knee rehabilitation.    CONSTIPATION  Constipation is defined medically as fewer  than three stools per week and severe constipation as less than one stool per week.  Even if you have a regular bowel pattern at home, your normal regimen is likely to be disrupted due to multiple reasons following surgery.  Combination of anesthesia, postoperative narcotics, change in appetite and fluid intake all can affect your bowels.   YOU MUST use at least one of the following options; they are listed in order of increasing strength to get the job done.  They are all available over the counter, and you may need to use some, POSSIBLY even all of these options:    Drink plenty of fluids (prune juice may be helpful) and high fiber foods Colace 100 mg by mouth twice a day  Senokot for constipation as directed and as needed Dulcolax (bisacodyl), take with full glass of water  Miralax (polyethylene glycol) once or twice a day as needed.  If you have tried all these things and are unable to have a bowel movement in the first 3-4 days after surgery call either your surgeon or your primary doctor.    If you experience loose stools or diarrhea, hold the medications until you stool forms back up.  If your symptoms do not get better within 1 week or if they get worse, check with your doctor.  If you experience "the worst abdominal pain ever" or develop nausea or vomiting, please contact the office immediately for further recommendations for treatment.   ITCHING:  If you experience itching with your medications, try taking only a single pain pill, or even half a pain pill at a time.  You can also use Benadryl over the counter for itching or also to help with sleep.   TED HOSE STOCKINGS:  Use stockings on both legs until for at least 2 weeks or as directed by physician office. They may be removed at night for sleeping.  MEDICATIONS:  See your medication summary on the "After Visit Summary" that nursing will review with you.  You may have some home medications which will be placed on hold until you complete  the course of blood thinner medication.  It is important for you to complete the blood thinner medication as prescribed.  PRECAUTIONS:  If you experience chest pain or shortness of breath - call 911 immediately for transfer to the hospital emergency department.   If you develop a fever greater that 101 F, purulent drainage from wound, increased redness or drainage from wound, foul odor from the wound/dressing, or calf pain - CONTACT YOUR SURGEON.  FOLLOW-UP APPOINTMENTS:  If you do not already have a post-op appointment, please call the office for an appointment to be seen by your surgeon.  Guidelines for how soon to be seen are listed in your "After Visit Summary", but are typically between 1-4 weeks after surgery.  OTHER INSTRUCTIONS:   Knee Replacement:  Do not place pillow under knee, focus on keeping the knee straight while resting. CPM instructions: 0-90 degrees, 2 hours in the morning, 2 hours in the afternoon, and 2 hours in the evening. Place foam block, curve side up under heel at all times except when in CPM or when walking.  DO NOT modify, tear, cut, or change the foam block in any way.  MAKE SURE YOU:  Understand these instructions.  Get help right away if you are not doing well or get worse.    Thank you for letting us be a part of your medical care team.  It is a privilege we respect greatly.  We hope these instructions will help you stay on track for a fast and full recovery!     Driving restrictions    Complete by:  As directed   No driving for 4 weeks     Increase activity slowly as tolerated    Complete by:  As directed            DISCHARGE MEDICATIONS:     Medication List    STOP taking these medications        aspirin EC 81 MG tablet     COQ-10 PO     CRANBERRY PO      TAKE these medications        cholecalciferol 1000 units tablet  Commonly known as:  VITAMIN D  Take 1,000 Units by mouth daily.      [employer] FLAXSEED OIL PO  Take 1,400 mg by mouth daily.     enoxaparin 40 MG/0.4ML injection  Commonly known as:  LOVENOX  Inject 0.4 mLs (40 mg total) into the skin daily.     EQL OMEGA 3 FISH OIL 1200 MG Caps  Take 1 capsule by mouth daily.     Garlic 123XX123 MG Caps  Take 1,000 mg by mouth daily.     GRAPE SEED PO  Take 1 tablet by mouth daily. Grape seed 50mg  & magnesium 15mg  OTC     metFORMIN 500 MG 24 hr tablet  Commonly known as:  GLUCOPHAGE-XR  Take 500 mg by mouth daily after supper.     methocarbamol 500 MG tablet  Commonly known as:  ROBAXIN  Take 1-2 tablets (500-1,000 mg total) by mouth every 6 (six) hours as needed for muscle spasms.     multivitamin with minerals tablet  Take 1 tablet by mouth daily.     omega-3 acid ethyl esters 1 g capsule  Commonly known as:  LOVAZA  Take 1 g by mouth daily.     omeprazole 10 MG capsule  Commonly known as:  PRILOSEC  Take 10 mg by mouth daily as needed.     ondansetron 4 MG tablet  Commonly known as:  ZOFRAN  Take 1 tablet (4 mg total) by mouth every 6 (six) hours as needed for nausea.     oxyCODONE 5 MG immediate release tablet  Commonly known as:  Oxy IR/ROXICODONE  Take 1-2 tablets (5-10 mg total) by mouth every 3 (three) hours as needed for breakthrough pain.     oxyCODONE 10 mg 12 hr tablet  Commonly known  as:  OXYCONTIN  Take 1 tablet (10 mg total) by mouth every 12 (twelve) hours.     simvastatin 20 MG tablet  Commonly known as:  ZOCOR  Take 20 mg by mouth daily after supper.     SUPER B COMPLEX PO  Take 1 tablet by mouth daily.     travoprost (benzalkonium) 0.004 % ophthalmic solution  Commonly known as:  TRAVATAN  Place 1 drop into both eyes at bedtime. 5% solution per patient     Turmeric Curcumin 500 MG Caps  Take 1 capsule by mouth daily. On hold     vitamin C 500 MG tablet  Commonly known as:  ASCORBIC ACID  Take 1,000 mg by mouth daily.        FOLLOW UP VISIT:       Follow-up Information     Follow up with [facility].   Why:  They will contact you to schedule home therapy visits.    Contact information:   [address] [place] [address] [phone]       Follow up with Rudean Haskell, MD. Call on 07/24/2015.   Specialty:  Orthopedic Surgery   Contact information:   [place] [address] [phone]       DISPOSITION: HOME VS. SNF  CONDITION:  Good   Donia Ast 07/10/2015, 12:32 PM

## 2015-07-10 NOTE — Evaluation (Signed)
Occupational Therapy Evaluation Patient Details Name: [name] MRN: [id number] DOB: [birth year] Today's Date: 07/10/2015    History of Present Illness Pt is an 82 y/o female who presents s/p elective R TKA on 07/09/15.   Clinical Impression   This 82 yo female admitted with above presents to acute OT with deficits below (see OT problem list) thus affecting her ability to care for herself at a Min A level for basic ADLs pta. She will benefit from acute OT with follow up [place] to get back to PLOF    Follow Up Recommendations  Home health OT    Equipment Recommendations  3 in 1 bedside comode       Precautions / Restrictions Precautions Precautions: Fall;Knee Precaution Comments: Pt was educated on NO pillow/roll under knee and towel roll or bone foam under heel Restrictions Weight Bearing Restrictions: No RLE Weight Bearing: Weight bearing as tolerated      Mobility Bed Mobility     General bed mobility comments: Pt up in recliner upon arrival and wanted to go back to recliner at end of session  Transfers Overall transfer level: Needs assistance Equipment used: Rolling walker (2 wheeled) Transfers: Sit to/from Stand Sit to Stand: Min assist         General transfer comment: Increased time required for sit>stand    Balance Overall balance assessment: Needs assistance Sitting-balance support: Feet supported;No upper extremity supported Sitting balance-Leahy Scale: Good     Standing balance support: Bilateral upper extremity supported;During functional activity Standing balance-Leahy Scale: Poor Standing balance comment: Reliant on RW                            ADL Overall ADL's : Needs assistance/impaired Eating/Feeding: Independent;Sitting   Grooming: Set up;Sitting   Upper Body Bathing: Set up;Sitting   Lower Body Bathing: Moderate assistance (min A sit<>stand)   Upper Body Dressing : Set up;Sitting   Lower Body Dressing: Maximal assistance  (min A sit<>stand)   Toilet Transfer: Minimal assistance;Ambulation;RW (recliner>12 feet>sit in recliner behind her)   Toileting- Water quality scientist and Hygiene: Minimal assistance (min A sit<>stand)         General ADL Comments: Educated pt on most efficient sequence of getting dressed               Pertinent Vitals/Pain Pain Assessment: 0-10 Pain Score: 2  Faces Pain Scale: Hurts little more Pain Location: right knee Pain Descriptors / Indicators: Sore Pain Intervention(s): Monitored during session;Repositioned     Hand Dominance Left   Extremity/Trunk Assessment Upper Extremity Assessment Upper Extremity Assessment: Overall WFL for tasks assessed           Communication Communication Communication: No difficulties   Cognition Arousal/Alertness: Awake/alert Behavior During Therapy: WFL for tasks assessed/performed Overall Cognitive Status: Within Functional Limits for tasks assessed                                Home Living Family/patient expects to be discharged to:: Private residence Living Arrangements: Children Available Help at Discharge: Family;Available 24 hours/day Type of Home: House Home Access: Stairs to enter [employer] of Steps: 2 in the front, 5-6 on the side of the house.  Entrance Stairs-Rails: Right;Left Home Layout: One level     Bathroom Shower/Tub: Walk-in shower;Tub/shower unit Shower/tub characteristics: Door (walk in shower) Biochemist, clinical: Standard     Home Equipment: Environmental consultant -  2 wheels;Cane - single point          Prior Functioning/Environment Level of Independence: Independent             OT Diagnosis: Generalized weakness;Acute pain   OT Problem List: Decreased strength;Decreased range of motion;Impaired balance (sitting and/or standing);Pain;Obesity;Decreased knowledge of use of DME or AE   OT Treatment/Interventions: Self-care/ADL training;Patient/family education;Balance  training;Therapeutic activities;DME and/or AE instruction    OT Goals(Current goals can be found in the care plan section) Acute Rehab OT Goals Patient Stated Goal: to go home OT Goal Formulation: With patient Time For Goal Achievement: 07/17/15 Potential to Achieve Goals: Good  OT Frequency: Min 2X/week              End of Session Equipment Utilized During Treatment: Rolling walker  Activity Tolerance: Patient tolerated treatment well Patient left: in chair;with call bell/phone within reach;with chair alarm set   Time: 1132-1202 OT Time Calculation (min): 30 min Charges:  OT General Charges $OT Visit: 1 Procedure OT Evaluation $OT Eval Moderate Complexity: 1 Procedure OT Treatments $Self Care/Home Management : 8-22 mins  Almon Register [id number] 07/10/2015, 12:09 PM

## 2015-07-10 NOTE — Care Management Note (Signed)
Case Management Note  Patient Details  Name: [name] MRN: [id number] Date of Birth: [birth year]  Subjective/Objective:              S/p right total knee arthroplasty      Action/Plan: Set up with [facility] for HHPT by MD office. Spoke with patient, no change in discharge plan. Patient stated that her daughter will be assisting her after discharge. [employer] has delivered rolling walker to home and will deliver CPM and 3N1 once patient home. OT recommended HHOT. Contacted Kerrilynn with [facility] and set up [place].    Expected Discharge Date:                  Expected Discharge Plan:  [place]  In-House Referral:  NA  Discharge planning Services  CM Consult  Post Acute Care Choice:  Durable Medical Equipment, Home Health Choice offered to:  Patient  DME Arranged:  3-N-1, CPM, Walker rolling DME Agency:  [employer]  HH Arranged:  PT, OT HH Agency:  [facility]  Status of Service:  Completed, signed off  Medicare Important Message Given:    Date Medicare IM Given:    Medicare IM give by:    Date Additional Medicare IM Given:    Additional Medicare Important Message give by:     If discussed at [place] of Stay Meetings, dates discussed:    Additional Comments:  [name], [place], RN 07/10/2015, 12:15 PM

## 2015-07-10 NOTE — Progress Notes (Signed)
Orthopedic Tech Progress Note Patient Details:  [name] 09/15/1931 [id number]  CPM Right Knee CPM Right Knee: On Right Knee Flexion (Degrees): 90 Right Knee Extension (Degrees): 0 Additional Comments:  (zero degree knee in place)   [name] 07/10/2015, 7:05 PM

## 2015-07-10 NOTE — Progress Notes (Signed)
Physical Therapy Treatment Patient Details Name: [name] MRN: [id number] DOB: 12-Feb-1932 Today's Date: 07/10/2015    History of Present Illness Pt is an 82 y/o female who presents s/p elective R TKA on 07/09/15.    PT Comments    Pt progressing slowly towards physical therapy goals. Pt required +2 assist to achieve stand from recliner chair, and chair follow utilized during gait training for safety. Feel this patient would benefit from another round of therapy session(s) tomorrow prior to d/c. RN notified who states she will contact the PA. Will continue to follow.   Follow Up Recommendations  Home health PT;Supervision for mobility/OOB     Equipment Recommendations  3in1 (PT)    Recommendations for Other Services       Precautions / Restrictions Precautions Precautions: Fall;Knee Precaution Comments: Pt was educated on NO pillow/roll under knee and towel roll or bone foam under heel Restrictions Weight Bearing Restrictions: Yes RLE Weight Bearing: Weight bearing as tolerated    Mobility  Bed Mobility               General bed mobility comments: Pt received sitting up in the chair.   Transfers Overall transfer level: Needs assistance Equipment used: Rolling walker (2 wheeled) Transfers: Sit to/from Stand Sit to Stand: Mod assist;+2 physical assistance         General transfer comment: Mod A to power-up to full standing position from recliner chair. Unable to achieve full stand with +1 assist. Increased time required.   Ambulation/Gait Ambulation/Gait assistance: Min assist;+2 safety/equipment Ambulation Distance (Feet): 50 Feet Assistive device: Rolling walker (2 wheeled) Gait Pattern/deviations: Step-to pattern;Decreased stride length;Trunk flexed Gait velocity: Very slow Gait velocity interpretation: Below normal speed for age/gender General Gait Details: VC's for sequencing with the RW. Grossly min guard with occasional min assist for balance support and  advancing walker.    Stairs            Wheelchair Mobility    Modified Rankin (Stroke Patients Only)       Balance Overall balance assessment: Needs assistance Sitting-balance support: Feet supported;No upper extremity supported Sitting balance-Leahy Scale: Fair     Standing balance support: Bilateral upper extremity supported;During functional activity Standing balance-Leahy Scale: Poor Standing balance comment: Reliant on RW                    Cognition Arousal/Alertness: Awake/alert Behavior During Therapy: WFL for tasks assessed/performed Overall Cognitive Status: Within Functional Limits for tasks assessed                      Exercises Total Joint Exercises Heel Slides: Seated;10 reps Goniometric ROM: 79 AAROM    General Comments        Pertinent Vitals/Pain Pain Assessment: Faces Pain Score: 2  Faces Pain Scale: Hurts little more Pain Location: R knee Pain Descriptors / Indicators: Operative site guarding Pain Intervention(s): Limited activity within patient's tolerance;Monitored during session;Repositioned    Home Living Family/patient expects to be discharged to:: Private residence Living Arrangements: Children Available Help at Discharge: Family;Available 24 hours/day Type of Home: House Home Access: Stairs to enter Entrance Stairs-Rails: Right;Left Home Layout: One level Home Equipment: Environmental consultant - 2 wheels;Cane - single point      Prior Function Level of Independence: Independent          PT Goals (current goals can now be found in the care plan section) Acute Rehab PT Goals Patient Stated Goal: Return home at d/c  PT Goal Formulation: With patient/family Time For Goal Achievement: 07/16/15 Potential to Achieve Goals: Good Progress towards PT goals: Progressing toward goals    Frequency  7X/week    PT Plan Current plan remains appropriate    Co-evaluation             End of Session Equipment Utilized  During Treatment: Gait belt;Oxygen Activity Tolerance: Patient tolerated treatment well Patient left: in chair;with chair alarm set;with call bell/phone within reach     Time: 1430-1458 PT Time Calculation (min) (ACUTE ONLY): 28 min  Charges:  $Gait Training: 23-37 mins                    G Codes:      [name] 08-02-2015, 3:14 PM   [name], PT, DPT Acute Rehabilitation Services Pager: [phone]

## 2015-07-10 NOTE — Op Note (Signed)
TOTAL KNEE REPLACEMENT OPERATIVE NOTE:  07/09/2015  5:52 PM  PATIENT:  [name]  82 y.o. female  PRE-OPERATIVE DIAGNOSIS:  primary osteoarthritis right knee  POST-OPERATIVE DIAGNOSIS:  primary osteoarthritis right knee  PROCEDURE:  Procedure(s): RIGHT TOTAL KNEE ARTHROPLASTY  SURGEON:  Surgeon(s): Vickey Huger, MD  PHYSICIAN ASSISTANT: Carlyon Shadow, [facility]  ANESTHESIA:   spinal  DRAINS: Hemovac  SPECIMEN: None  COUNTS:  Correct  TOURNIQUET:   Total Tourniquet Time Documented: Thigh (Right) - 55 minutes Total: Thigh (Right) - 55 minutes   DICTATION:  Indication for procedure:    The patient is a 82 y.o. female who has failed conservative treatment for primary osteoarthritis right knee.  Informed consent was obtained prior to anesthesia. The risks versus benefits of the operation were explain and in a way the patient can, and did, understand.   On the implant demand matching protocol, this patient scored 10.  Therefore, this patient did" "did not receive a polyethylene insert with vitamin E which is a high demand implant.  Description of procedure:     The patient was taken to the operating room and placed under anesthesia.  The patient was positioned in the usual fashion taking care that all body parts were adequately padded and/or protected.  I foley catheter was not placed.  A tourniquet was applied and the leg prepped and draped in the usual sterile fashion.  The extremity was exsanguinated with the esmarch and tourniquet inflated to 350 mmHg.  Pre-operative range of motion was normal.  The knee was in 5 degree of mild varus.  A midline incision approximately 6-7 inches long was made with a #10 blade.  A new blade was used to make a parapatellar arthrotomy going 2-3 cm into the quadriceps tendon, over the patella, and alongside the medial aspect of the patellar tendon.  A synovectomy was then performed with the #10 blade and forceps. I then elevated the deep MCL off the  medial tibial metaphysis subperiosteally around to the semimembranosus attachment.    I everted the patella and used calipers to measure patellar thickness.  I used the reamer to ream down to appropriate thickness to recreate the native thickness.  I then removed excess bone with the rongeur and sagittal saw.  I used the appropriately sized template and drilled the three lug holes.  I then put the trial in place and measured the thickness with the calipers to ensure recreation of the native thickness.  The trial was then removed and the patella subluxed and the knee brought into flexion.  A homan retractor was place to retract and protect the patella and lateral structures.  A Z-retractor was place medially to protect the medial structures.  The extra-medullary alignment system was used to make cut the tibial articular surface perpendicular to the anamotic axis of the tibia and in 3 degrees of posterior slope.  The cut surface and alignment jig was removed.  I then used the intramedullary alignment guide to make a 6 valgus cut on the distal femur.  I then marked out the epicondylar axis on the distal femur.  The posterior condylar axis measured 3 degrees.  I then used the anterior referencing sizer and measured the femur to be a size 7.  The 4-In-1 cutting block was screwed into place in external rotation matching the posterior condylar angle, making our cuts perpendicular to the epicondylar axis.  Anterior, posterior and chamfer cuts were made with the sagittal saw.  The cutting block and cut  pieces were removed.  A lamina spreader was placed in 90 degrees of flexion.  The ACL, PCL, menisci, and posterior condylar osteophytes were removed.  A 10 mm spacer blocked was found to offer good flexion and extension gap balance after minimal in degree releasing.   The scoop retractor was then placed and the femoral finishing block was pinned in place.  The small sagittal saw was used as well as the lug drill to  finish the femur.  The block and cut surfaces were removed and the medullary canal hole filled with autograft bone from the cut pieces.  The tibia was delivered forward in deep flexion and external rotation.  A size D tray was selected and pinned into place centered on the medial 1/3 of the tibial tubercle.  The reamer and keel was used to prepare the tibia through the tray.    I then trialed with the size 7 femur, size D tibia, a 10 mm insert and the 32 patella.  I had excellent flexion/extension gap balance, excellent patella tracking.  Flexion was full and beyond 120 degrees; extension was zero.  These components were chosen and the staff opened them to me on the back table while the knee was lavaged copiously and the cement mixed.  The soft tissue was infiltrated with 60cc of exparel 1.3% through a 21 gauge needle.  I cemented in the components and removed all excess cement.  The polyethylene tibial component was snapped into place and the knee placed in extension while cement was hardening.  The capsule was infilltrated with 30cc of .25% Marcaine with epinephrine.  A hemovac was place in the joint exiting superolaterally.  A pain pump was place superomedially superficial to the arthrotomy.  Once the cement was hard, the tourniquet was let down.  Hemostasis was obtained.  The arthrotomy was closed with figure-8 #1 vicryl sutures.  The deep soft tissues were closed with #0 vicryls and the subcuticular layer closed with a running #2-0 vicryl.  The skin was reapproximated and closed with skin staples.  The wound was dressed with xeroform, 4 x4's, 2 ABD sponges, a single layer of webril and a TED stocking.   The patient was then awakened, extubated, and taken to the recovery room in stable condition.  BLOOD LOSS:  300cc DRAINS: 1 hemovac, 1 pain catheter COMPLICATIONS:  None.  PLAN OF CARE: Admit to inpatient   PATIENT DISPOSITION:  PACU - hemodynamically stable.   Delay start of Pharmacological  VTE agent (>24hrs) due to surgical blood loss or risk of bleeding:  not applicable  Please fax a copy of this op note to my office at [phone] (please only include page 1 and 2 of the Case Information op note)

## 2015-07-10 NOTE — Progress Notes (Signed)
Physical Therapy Treatment Patient Details Name: [name] MRN: [id number] DOB: Sep 17, 1931 Today's Date: 07/10/2015    History of Present Illness Pt is an 82 y/o female who presents s/p elective R TKA on 07/09/15.    PT Comments    Pt progressing towards physical therapy goals. Was able to perform transfers and ambulation with increased time and min to mod assist. Pt appeared very fatigued and reports feeling overheated at end of session and progression of exercise was deferred to afternoon session. Will continue to follow and progress as able per POC.   Follow Up Recommendations  Home health PT;Supervision for mobility/OOB     Equipment Recommendations  3in1 (PT)    Recommendations for Other Services       Precautions / Restrictions Precautions Precautions: Fall;Knee Precaution Comments: Pt was educated on NO pillow/roll under knee and towel roll or bone foam under heel Restrictions Weight Bearing Restrictions: Yes RLE Weight Bearing: Weight bearing as tolerated    Mobility  Bed Mobility Overal bed mobility: Needs Assistance Bed Mobility: Supine to Sit     Supine to sit: Min assist     General bed mobility comments: VC's for sequencing and technique. Min assist to advance RLE to EOB. Pt able to successfully scoot herself out to EOB without assistance.   Transfers Overall transfer level: Needs assistance Equipment used: Rolling walker (2 wheeled) Transfers: Sit to/from Stand Sit to Stand: Mod assist         General transfer comment: Mod A to power-up to full standing position from low EOB and even after height was raised a little bit. From high BSC pt required min assist for steadying. Increased time required.   Ambulation/Gait Ambulation/Gait assistance: Min guard;Min assist Ambulation Distance (Feet): 25 Feet Assistive device: Rolling walker (2 wheeled) Gait Pattern/deviations: Step-to pattern;Decreased stride length Gait velocity: Decreased Gait velocity  interpretation: Below normal speed for age/gender General Gait Details: VC's for sequencing with the RW. Grossly min guard with occasional min assist for balance support and advancing walker.    Stairs            Wheelchair Mobility    Modified Rankin (Stroke Patients Only)       Balance Overall balance assessment: Needs assistance Sitting-balance support: Feet supported;No upper extremity supported Sitting balance-Leahy Scale: Fair     Standing balance support: Bilateral upper extremity supported;During functional activity Standing balance-Leahy Scale: Poor                      Cognition Arousal/Alertness: Awake/alert Behavior During Therapy: WFL for tasks assessed/performed Overall Cognitive Status: Within Functional Limits for tasks assessed                      Exercises Total Joint Exercises Ankle Circles/Pumps: 15 reps Quad Sets: 10 reps    General Comments        Pertinent Vitals/Pain Pain Assessment: Faces Faces Pain Scale: Hurts little more Pain Location: R knee Pain Descriptors / Indicators: Operative site guarding;Aching Pain Intervention(s): Limited activity within patient's tolerance;Monitored during session;Repositioned    Home Living                      Prior Function            PT Goals (current goals can now be found in the care plan section) Acute Rehab PT Goals Patient Stated Goal: Return home at d/c PT Goal Formulation: With patient/family Time For Goal  Achievement: 07/16/15 Potential to Achieve Goals: Good Progress towards PT goals: Progressing toward goals    Frequency  7X/week    PT Plan Current plan remains appropriate    Co-evaluation             End of Session Equipment Utilized During Treatment: Gait belt;Oxygen Activity Tolerance: Patient tolerated treatment well Patient left: in chair;with chair alarm set;with call bell/phone within reach;with family/visitor present     Time:  [id number] PT Time Calculation (min) (ACUTE ONLY): 28 min  Charges:  $Gait Training: 23-37 mins                    G Codes:      Rolinda Roan 24-Jul-2015, 9:26 AM   Rolinda Roan, PT, DPT Acute Rehabilitation Services Pager: [phone]

## 2015-07-10 NOTE — Progress Notes (Signed)
Orthopedic Tech Progress Note Patient Details:  [name] May 03, 1931 [id number] Pt. refused morning CPM. CPM Right Knee CPM Right Knee: Off Right Knee Flexion (Degrees): 90 Right Knee Extension (Degrees): 0 Additional Comments:  (zero degree knee in place)   Darrol Poke 07/10/2015, 6:40 AM

## 2015-07-10 NOTE — Progress Notes (Signed)
PT worked with patient this afternoon stated patient was not ready for d/c, Carlyon Shadow notified of patient needing to stay another night.  Kamarie Veno, Tivis Ringer, RN

## 2015-07-10 NOTE — Progress Notes (Signed)
SPORTS MEDICINE AND JOINT REPLACEMENT  Lara Mulch, MD   [facility] PA-C [place], [place],   [address]                             [phone]   PROGRESS NOTE  Subjective:  negative for Chest Pain  negative for Shortness of Breath  negative for Nausea/Vomiting   negative for Calf Pain  negative for Bowel Movement   Tolerating Diet: yes         Patient reports pain as 6 on 0-10 scale.    Objective: Vital signs in last 24 hours:   Patient Vitals for the past 24 hrs:  BP Temp Temp src Pulse Resp SpO2  07/09/15 2330 (!) 122/50 mmHg 98.4 F (36.9 C) Oral 60 18 98 %  07/09/15 1900 (!) 153/58 mmHg 98.4 F (36.9 C) Oral 60 18 98 %  07/09/15 1242 (!) 120/57 mmHg 98.8 F (37.1 C) - 67 18 98 %  07/09/15 1207 - 98 F (36.7 C) - - - -  07/09/15 1203 (!) 108/57 mmHg - - - - -  07/09/15 1148 (!) 104/57 mmHg - - 60 12 99 %  07/09/15 1133 (!) 106/53 mmHg - - 60 12 99 %  07/09/15 1120 (!) 104/57 mmHg - - 63 13 99 %  07/09/15 1103 (!) 102/52 mmHg - - 62 13 99 %  07/09/15 1048 (!) 106/56 mmHg - - 63 12 99 %  07/09/15 1033 (!) 109/53 mmHg - - 65 11 99 %  07/09/15 1018 (!) 106/55 mmHg - - 65 12 99 %  07/09/15 1013 (!) 105/57 mmHg - - 67 12 98 %  07/09/15 1003 (!) 110/55 mmHg - - 66 14 99 %  07/09/15 0954 (!) 107/57 mmHg - - 66 13 98 %  07/09/15 0948 (!) 106/52 mmHg - - 65 13 98 %  07/09/15 0943 (!) 99/51 mmHg - - 68 13 97 %  07/09/15 0940 (!) 92/54 mmHg - - 69 19 96 %  07/09/15 0933 (!) 94/48 mmHg - - 68 14 95 %  07/09/15 0930 - 98.5 F (36.9 C) - - - -    @flow {1959:LAST@   Intake/Output from previous day:   05/01 0701 - 05/02 0700 In: 4163.8 [P.O.:600; I.V.:3353.8] Out: 425 [Urine:400]   Intake/Output this shift:       Intake/Output      05/01 0701 - 05/02 0700 05/02 0701 - 05/03 0700   P.O. 600    I.V. (mL/kg) 3353.8 (34.6)    IV Piggyback 210    Total Intake(mL/kg) 4163.8 (42.9)    Urine (mL/kg/hr) 400 (0.2)    Blood 25 (0)    Total Output 425      Net +3738.8          Urine Occurrence 1 x       LABORATORY DATA:  Recent Labs  07/05/15 1321 07/09/15 1258 07/10/15 0420  WBC 5.6 6.2 6.9  HGB 13.9 11.8* 11.8*  HCT 44.0 36.9 36.7  PLT 232 173 184    Recent Labs  07/05/15 1321 07/09/15 1258 07/10/15 0420  NA 143  --  137  K 4.1  --  4.0  CL 108  --  108  CO2 24  --  23  BUN 17  --  13  CREATININE 0.99 0.83 0.79  GLUCOSE 119*  --  147*  CALCIUM 9.6  --  8.6*  Lab Results  Component Value Date   INR 1.04 07/05/2015    Examination:  General appearance: alert, cooperative and no distress Extremities: extremities normal, atraumatic, no cyanosis or edema  Wound Exam: clean, dry, intact   Drainage:  None: wound tissue dry  Motor Exam: Quadriceps and Hamstrings Intact  Sensory Exam: Superficial Peroneal, Deep Peroneal and Tibial normal   Assessment:    1 Day Post-Op  Procedure(s) (LRB): RIGHT TOTAL KNEE ARTHROPLASTY (Right)  ADDITIONAL DIAGNOSIS:  Active Problems:   S/P total knee replacement  Acute Blood Loss Anemia   Plan: Physical Therapy as ordered Weight Bearing as Tolerated (WBAT)  DVT Prophylaxis:  Lovenox  DISCHARGE PLAN: Home  DISCHARGE NEEDS: HHPT   Expect D/C today once cleared from PT         Donia Ast 07/10/2015, 7:01 AM

## 2015-07-11 LAB — GLUCOSE, CAPILLARY
Glucose-Capillary: 105 mg/dL — ABNORMAL HIGH (ref 65–99)
Glucose-Capillary: 168 mg/dL — ABNORMAL HIGH (ref 65–99)
Glucose-Capillary: 184 mg/dL — ABNORMAL HIGH (ref 65–99)

## 2015-07-11 LAB — CBC
HEMATOCRIT: 36.5 % (ref 36.0–46.0)
HEMOGLOBIN: 11.5 g/dL — AB (ref 12.0–15.0)
MCH: 28.9 pg (ref 26.0–34.0)
MCHC: 31.5 g/dL (ref 30.0–36.0)
MCV: 91.7 fL (ref 78.0–100.0)
Platelets: 180 10*3/uL (ref 150–400)
RBC: 3.98 MIL/uL (ref 3.87–5.11)
RDW: 14.3 % (ref 11.5–15.5)
WBC: 7.2 10*3/uL (ref 4.0–10.5)

## 2015-07-11 NOTE — Progress Notes (Signed)
Occupational Therapy Treatment and Discharge Patient Details Name: [name] MRN: [id number] DOB: 28-Jun-1931 Today's Date: 07/11/2015    History of present illness Pt is an 82 y/o female who presents s/p elective R TKA on 07/09/15.   OT comments  This 82 yo female admitted and underwent above presents to acute OT with all acute OT education completed, we will D/C from acute OT with recommended follow up [place].  Follow Up Recommendations  Home health OT    Equipment Recommendations  3 in 1 bedside comode       Precautions / Restrictions Precautions Precautions: Fall;Knee Restrictions Weight Bearing Restrictions: No RLE Weight Bearing: Weight bearing as tolerated       Mobility Bed Mobility               General bed mobility comments: Pt up in recliner upon arrival  Transfers Overall transfer level: Needs assistance Equipment used: Rolling walker (2 wheeled) Transfers: Sit to/from Stand Sit to Stand: Min assist (with increased time for sit>stand)              Balance Overall balance assessment: Needs assistance Sitting-balance support: Feet supported;No upper extremity supported Sitting balance-Leahy Scale: Good     Standing balance support: Bilateral upper extremity supported;During functional activity Standing balance-Leahy Scale: Poor Standing balance comment: Reliant on RW                   ADL Overall ADL's : Needs assistance/impaired                                 Tub/ Shower Transfer: Walk-in shower;Minimal assistance;Ambulation;Rolling walker;3 in 1 Tub/Shower Transfer Details (indicate cue type and reason): Pt needs increased time for sit>stand                    Cognition   Behavior During Therapy: [facility] for tasks assessed/performed Overall Cognitive Status: Within Functional Limits for tasks assessed                                    Pertinent Vitals/ Pain       Pain Assessment: 0-10 Pain Score: 4   Pain Location: right knee Pain Descriptors / Indicators: Aching;Sore Pain Intervention(s): Monitored during session;Repositioned;Ice applied         Frequency Min 2X/week     Progress Toward Goals  OT Goals(current goals can now be found in the care plan section)  Progress towards OT goals: Progressing toward goals     Plan Discharge plan remains appropriate       End of Session Equipment Utilized During Treatment: Rolling walker;Gait belt   Activity Tolerance Patient tolerated treatment well   Patient Left in chair;with call bell/phone within reach;with chair alarm set   Nurse Communication          Time: [phone] OT Time Calculation (min): 32 min  Charges: OT General Charges $OT Visit: 1 Procedure OT Treatments $Self Care/Home Management : 23-37 mins  Almon Register [id number] 07/11/2015, 10:19 AM

## 2015-07-11 NOTE — Progress Notes (Signed)
Physical Therapy Treatment Patient Details Name: [name] MRN: [id number] DOB: 1931-12-23 Today's Date: 07/11/2015    History of Present Illness Pt is an 82 y/o female who presents s/p elective R TKA on 07/09/15.    PT Comments    Patient continues to progress toward mobility goals. Current plan remains appropriate.   Follow Up Recommendations  Home health PT;Supervision for mobility/OOB     Equipment Recommendations  3in1 (PT)    Recommendations for Other Services       Precautions / Restrictions Precautions Precautions: Fall;Knee Precaution Comments: Pt was educated on NO pillow/roll under knee and towel roll or bone foam under heel Restrictions Weight Bearing Restrictions: Yes RLE Weight Bearing: Weight bearing as tolerated    Mobility  Bed Mobility               General bed mobility comments: Pt received sitting up in the chair.   Transfers Overall transfer level: Needs assistance Equipment used: Rolling walker (2 wheeled) Transfers: Sit to/from Stand Sit to Stand: Min assist         General transfer comment: min A to power up into standing with increased time needed and cues for technique and hand placement  Ambulation/Gait Ambulation/Gait assistance: Supervision Ambulation Distance (Feet): 150 Feet Assistive device: Rolling walker (2 wheeled) Gait Pattern/deviations: Step-through pattern;Decreased weight shift to right;Decreased stride length;Trunk flexed Gait velocity: decreased Gait velocity interpretation: Below normal speed for age/gender General Gait Details: cues for position of RW, posture, and encouragement to increase WB on R LE   Stairs Stairs: Yes Stairs assistance: Min assist Stair Management: One rail Right;Sideways Number of Stairs: 2 General stair comments: vc for sequencing and technique; min guard for safety  Wheelchair Mobility    Modified Rankin (Stroke Patients Only)       Balance Overall balance assessment: Needs  assistance Sitting-balance support: No upper extremity supported;Feet supported Sitting balance-Leahy Scale: Good     Standing balance support: No upper extremity supported;During functional activity Standing balance-Leahy Scale: Poor Standing balance comment: Reliant on RW                    Cognition Arousal/Alertness: Awake/alert Behavior During Therapy: WFL for tasks assessed/performed Overall Cognitive Status: Within Functional Limits for tasks assessed                      Exercises Total Joint Exercises Ankle Circles/Pumps: 15 reps Quad Sets: 10 reps Short Arc Quad: 10 reps Heel Slides: Seated;10 reps Hip ABduction/ADduction: 10 reps Long Arc Quad: AROM;Right;10 reps;Seated Goniometric ROM: 84    General Comments General comments (skin integrity, edema, etc.): Reviewed all exercise on TKA handout      Pertinent Vitals/Pain Pain Assessment: Faces Faces Pain Scale: Hurts a little bit Pain Location: R knee Pain Descriptors / Indicators: Sore Pain Intervention(s): Limited activity within patient's tolerance;Monitored during session;Premedicated before session;Repositioned    Home Living                      Prior Function            PT Goals (current goals can now be found in the care plan section) Acute Rehab PT Goals Patient Stated Goal: Return home at d/c PT Goal Formulation: With patient/family Time For Goal Achievement: 07/16/15 Potential to Achieve Goals: Good Progress towards PT goals: Progressing toward goals    Frequency  7X/week    PT Plan Current plan remains appropriate  Co-evaluation             End of Session Equipment Utilized During Treatment: Gait belt Activity Tolerance: Patient tolerated treatment well Patient left: in chair;with chair alarm set;with call bell/phone within reach     Time: 1354-1432 PT Time Calculation (min) (ACUTE ONLY): 38 min  Charges:  $Gait Training: 23-37 mins $Therapeutic  Activity: 8-22 mins                    G Codes:      Salina April, PTA Pager: [phone]   07/11/2015, 3:04 PM

## 2015-07-11 NOTE — Progress Notes (Signed)
Physical Therapy Progress Note  Assessment: Pt progressing towards physical therapy goals. She was able to perform transfers and ambulation with min-mod assist and was educated in stair training. Pt will benefit from another therapy session prior to return home with family this afternoon as planned.    07/11/15 1450  PT Visit Information  Last PT Received On 07/11/15  Assistance Needed +2 (chair follow for distance)  History of Present Illness Pt is an 82 y/o female who presents s/p elective R TKA on 07/09/15.  Subjective Data  Subjective "I feel better today"  Patient Stated Goal Return home at d/c  Precautions  Precautions Fall;Knee  Precaution Comments Pt was educated on NO pillow/roll under knee and towel roll or bone foam under heel  Restrictions  Weight Bearing Restrictions Yes  RLE Weight Bearing WBAT  Pain Assessment  Pain Assessment Faces  Faces Pain Scale 4  Pain Location R knee  Pain Descriptors / Indicators Operative site guarding  Pain Intervention(s) Limited activity within patient's tolerance;Monitored during session;Repositioned  Cognition  Arousal/Alertness Awake/alert  Behavior During Therapy WFL for tasks assessed/performed  Overall Cognitive Status Within Functional Limits for tasks assessed  Bed Mobility  General bed mobility comments Pt received sitting up in the chair.   Transfers  Overall transfer level Needs assistance  Equipment used Rolling walker (2 wheeled)  Transfers Sit to/from Stand  Sit to Stand Mod assist  General transfer comment Mod A to power-up to full standing position from recliner chair.   Ambulation/Gait  Ambulation/Gait assistance Min guard;+2 physical assistance  Ambulation Distance (Feet) 125 Feet  Assistive device Rolling walker (2 wheeled)  Gait Pattern/deviations Step-through pattern;Decreased stride length;Trunk flexed  General Gait Details VC's for sequencing with the RW. Grossly min guard with occasional min assist for balance  support and advancing walker.   Gait velocity Very slow  Gait velocity interpretation Below normal speed for age/gender  Stairs Yes  Stairs assistance Min assist  Stair Management One rail Right;Sideways  Number of Stairs 2  General stair comments VC's for sequencing and safety.   Balance  Overall balance assessment Needs assistance  Sitting-balance support No upper extremity supported;Feet supported  Sitting balance-Leahy Scale Good  Standing balance support No upper extremity supported;During functional activity  Standing balance-Leahy Scale Poor  Standing balance comment Reliant on RW  General Comments  General comments (skin integrity, edema, etc.) Reviewed all exercise on TKA handout  Exercises  Exercises Total Joint  Total Joint Exercises  Ankle Circles/Pumps 15 reps  Quad Sets 10 reps  Short Arc Quad 10 reps  Heel Slides Seated;10 reps  Hip ABduction/ADduction 10 reps  Goniometric ROM 84  PT - End of Session  Equipment Utilized During Treatment Gait belt  Activity Tolerance Patient tolerated treatment well  Patient left in chair;with chair alarm set;with call bell/phone within reach  Nurse Communication Mobility status  PT - Assessment/Plan  PT Plan Current plan remains appropriate  PT Frequency (ACUTE ONLY) 7X/week  Follow Up Recommendations Home health PT;Supervision for mobility/OOB  PT equipment 3in1 (PT)  PT Goal Progression  Progress towards PT goals Progressing toward goals  Acute Rehab PT Goals  PT Goal Formulation With patient/family  Time For Goal Achievement 07/16/15  Potential to Achieve Goals Good  PT Time Calculation  PT Start Time (ACUTE ONLY) 1100  PT Stop Time (ACUTE ONLY) 1150  PT Time Calculation (min) (ACUTE ONLY) 50 min  PT General Charges  $$ ACUTE PT VISIT 1 Procedure  PT Treatments  $Gait  Training 23-37 mins  $Therapeutic Exercise 8-22 mins   Rolinda Roan, PT, DPT Acute Rehabilitation Services Pager: [phone]

## 2015-07-12 DIAGNOSIS — I10 Essential (primary) hypertension: Secondary | ICD-10-CM | POA: Diagnosis not present

## 2015-07-12 DIAGNOSIS — Z96651 Presence of right artificial knee joint: Secondary | ICD-10-CM | POA: Diagnosis not present

## 2015-07-12 DIAGNOSIS — Z87891 Personal history of nicotine dependence: Secondary | ICD-10-CM | POA: Diagnosis not present

## 2015-07-12 DIAGNOSIS — M1712 Unilateral primary osteoarthritis, left knee: Secondary | ICD-10-CM | POA: Diagnosis not present

## 2015-07-12 DIAGNOSIS — Z471 Aftercare following joint replacement surgery: Secondary | ICD-10-CM | POA: Diagnosis not present

## 2015-07-12 DIAGNOSIS — Z853 Personal history of malignant neoplasm of breast: Secondary | ICD-10-CM | POA: Diagnosis not present

## 2015-07-13 DIAGNOSIS — I10 Essential (primary) hypertension: Secondary | ICD-10-CM | POA: Diagnosis not present

## 2015-07-13 DIAGNOSIS — M1712 Unilateral primary osteoarthritis, left knee: Secondary | ICD-10-CM | POA: Diagnosis not present

## 2015-07-13 DIAGNOSIS — Z853 Personal history of malignant neoplasm of breast: Secondary | ICD-10-CM | POA: Diagnosis not present

## 2015-07-13 DIAGNOSIS — Z87891 Personal history of nicotine dependence: Secondary | ICD-10-CM | POA: Diagnosis not present

## 2015-07-13 DIAGNOSIS — Z471 Aftercare following joint replacement surgery: Secondary | ICD-10-CM | POA: Diagnosis not present

## 2015-07-13 DIAGNOSIS — Z96651 Presence of right artificial knee joint: Secondary | ICD-10-CM | POA: Diagnosis not present

## 2015-07-14 DIAGNOSIS — Z853 Personal history of malignant neoplasm of breast: Secondary | ICD-10-CM | POA: Diagnosis not present

## 2015-07-14 DIAGNOSIS — I10 Essential (primary) hypertension: Secondary | ICD-10-CM | POA: Diagnosis not present

## 2015-07-14 DIAGNOSIS — Z96651 Presence of right artificial knee joint: Secondary | ICD-10-CM | POA: Diagnosis not present

## 2015-07-14 DIAGNOSIS — M1712 Unilateral primary osteoarthritis, left knee: Secondary | ICD-10-CM | POA: Diagnosis not present

## 2015-07-14 DIAGNOSIS — Z471 Aftercare following joint replacement surgery: Secondary | ICD-10-CM | POA: Diagnosis not present

## 2015-07-14 DIAGNOSIS — Z87891 Personal history of nicotine dependence: Secondary | ICD-10-CM | POA: Diagnosis not present

## 2015-07-16 DIAGNOSIS — Z853 Personal history of malignant neoplasm of breast: Secondary | ICD-10-CM | POA: Diagnosis not present

## 2015-07-16 DIAGNOSIS — M1712 Unilateral primary osteoarthritis, left knee: Secondary | ICD-10-CM | POA: Diagnosis not present

## 2015-07-16 DIAGNOSIS — Z96651 Presence of right artificial knee joint: Secondary | ICD-10-CM | POA: Diagnosis not present

## 2015-07-16 DIAGNOSIS — Z471 Aftercare following joint replacement surgery: Secondary | ICD-10-CM | POA: Diagnosis not present

## 2015-07-16 DIAGNOSIS — Z87891 Personal history of nicotine dependence: Secondary | ICD-10-CM | POA: Diagnosis not present

## 2015-07-16 DIAGNOSIS — I10 Essential (primary) hypertension: Secondary | ICD-10-CM | POA: Diagnosis not present

## 2015-07-18 DIAGNOSIS — M1712 Unilateral primary osteoarthritis, left knee: Secondary | ICD-10-CM | POA: Diagnosis not present

## 2015-07-18 DIAGNOSIS — I10 Essential (primary) hypertension: Secondary | ICD-10-CM | POA: Diagnosis not present

## 2015-07-18 DIAGNOSIS — Z96651 Presence of right artificial knee joint: Secondary | ICD-10-CM | POA: Diagnosis not present

## 2015-07-18 DIAGNOSIS — Z87891 Personal history of nicotine dependence: Secondary | ICD-10-CM | POA: Diagnosis not present

## 2015-07-18 DIAGNOSIS — Z853 Personal history of malignant neoplasm of breast: Secondary | ICD-10-CM | POA: Diagnosis not present

## 2015-07-18 DIAGNOSIS — Z471 Aftercare following joint replacement surgery: Secondary | ICD-10-CM | POA: Diagnosis not present

## 2015-07-20 DIAGNOSIS — M1712 Unilateral primary osteoarthritis, left knee: Secondary | ICD-10-CM | POA: Diagnosis not present

## 2015-07-20 DIAGNOSIS — Z96651 Presence of right artificial knee joint: Secondary | ICD-10-CM | POA: Diagnosis not present

## 2015-07-20 DIAGNOSIS — Z471 Aftercare following joint replacement surgery: Secondary | ICD-10-CM | POA: Diagnosis not present

## 2015-07-20 DIAGNOSIS — Z853 Personal history of malignant neoplasm of breast: Secondary | ICD-10-CM | POA: Diagnosis not present

## 2015-07-20 DIAGNOSIS — I10 Essential (primary) hypertension: Secondary | ICD-10-CM | POA: Diagnosis not present

## 2015-07-20 DIAGNOSIS — Z87891 Personal history of nicotine dependence: Secondary | ICD-10-CM | POA: Diagnosis not present

## 2015-07-23 DIAGNOSIS — Z87891 Personal history of nicotine dependence: Secondary | ICD-10-CM | POA: Diagnosis not present

## 2015-07-23 DIAGNOSIS — Z471 Aftercare following joint replacement surgery: Secondary | ICD-10-CM | POA: Diagnosis not present

## 2015-07-23 DIAGNOSIS — Z853 Personal history of malignant neoplasm of breast: Secondary | ICD-10-CM | POA: Diagnosis not present

## 2015-07-23 DIAGNOSIS — I10 Essential (primary) hypertension: Secondary | ICD-10-CM | POA: Diagnosis not present

## 2015-07-23 DIAGNOSIS — Z96651 Presence of right artificial knee joint: Secondary | ICD-10-CM | POA: Diagnosis not present

## 2015-07-23 DIAGNOSIS — M1712 Unilateral primary osteoarthritis, left knee: Secondary | ICD-10-CM | POA: Diagnosis not present

## 2015-07-24 DIAGNOSIS — Z96651 Presence of right artificial knee joint: Secondary | ICD-10-CM | POA: Diagnosis not present

## 2015-07-24 DIAGNOSIS — R262 Difficulty in walking, not elsewhere classified: Secondary | ICD-10-CM | POA: Diagnosis not present

## 2015-07-24 DIAGNOSIS — G8929 Other chronic pain: Secondary | ICD-10-CM | POA: Diagnosis not present

## 2015-07-24 DIAGNOSIS — M25561 Pain in right knee: Secondary | ICD-10-CM | POA: Diagnosis not present

## 2015-07-24 DIAGNOSIS — M25461 Effusion, right knee: Secondary | ICD-10-CM | POA: Diagnosis not present

## 2015-07-24 DIAGNOSIS — M25661 Stiffness of right knee, not elsewhere classified: Secondary | ICD-10-CM | POA: Diagnosis not present

## 2015-07-27 DIAGNOSIS — R262 Difficulty in walking, not elsewhere classified: Secondary | ICD-10-CM | POA: Diagnosis not present

## 2015-07-27 DIAGNOSIS — M25661 Stiffness of right knee, not elsewhere classified: Secondary | ICD-10-CM | POA: Diagnosis not present

## 2015-07-27 DIAGNOSIS — M25561 Pain in right knee: Secondary | ICD-10-CM | POA: Diagnosis not present

## 2015-07-27 DIAGNOSIS — G8929 Other chronic pain: Secondary | ICD-10-CM | POA: Diagnosis not present

## 2015-07-30 DIAGNOSIS — M25661 Stiffness of right knee, not elsewhere classified: Secondary | ICD-10-CM | POA: Diagnosis not present

## 2015-07-30 DIAGNOSIS — M25561 Pain in right knee: Secondary | ICD-10-CM | POA: Diagnosis not present

## 2015-07-30 DIAGNOSIS — R262 Difficulty in walking, not elsewhere classified: Secondary | ICD-10-CM | POA: Diagnosis not present

## 2015-07-30 DIAGNOSIS — G8929 Other chronic pain: Secondary | ICD-10-CM | POA: Diagnosis not present

## 2015-08-02 DIAGNOSIS — M25661 Stiffness of right knee, not elsewhere classified: Secondary | ICD-10-CM | POA: Diagnosis not present

## 2015-08-02 DIAGNOSIS — R262 Difficulty in walking, not elsewhere classified: Secondary | ICD-10-CM | POA: Diagnosis not present

## 2015-08-02 DIAGNOSIS — G8929 Other chronic pain: Secondary | ICD-10-CM | POA: Diagnosis not present

## 2015-08-02 DIAGNOSIS — M25561 Pain in right knee: Secondary | ICD-10-CM | POA: Diagnosis not present

## 2015-08-08 DIAGNOSIS — M25561 Pain in right knee: Secondary | ICD-10-CM | POA: Diagnosis not present

## 2015-08-08 DIAGNOSIS — R262 Difficulty in walking, not elsewhere classified: Secondary | ICD-10-CM | POA: Diagnosis not present

## 2015-08-08 DIAGNOSIS — M25661 Stiffness of right knee, not elsewhere classified: Secondary | ICD-10-CM | POA: Diagnosis not present

## 2015-08-08 DIAGNOSIS — G8929 Other chronic pain: Secondary | ICD-10-CM | POA: Diagnosis not present

## 2015-08-10 DIAGNOSIS — M25561 Pain in right knee: Secondary | ICD-10-CM | POA: Diagnosis not present

## 2015-08-10 DIAGNOSIS — M25661 Stiffness of right knee, not elsewhere classified: Secondary | ICD-10-CM | POA: Diagnosis not present

## 2015-08-10 DIAGNOSIS — G8929 Other chronic pain: Secondary | ICD-10-CM | POA: Diagnosis not present

## 2015-08-10 DIAGNOSIS — R262 Difficulty in walking, not elsewhere classified: Secondary | ICD-10-CM | POA: Diagnosis not present

## 2015-08-14 DIAGNOSIS — R262 Difficulty in walking, not elsewhere classified: Secondary | ICD-10-CM | POA: Diagnosis not present

## 2015-08-14 DIAGNOSIS — M25661 Stiffness of right knee, not elsewhere classified: Secondary | ICD-10-CM | POA: Diagnosis not present

## 2015-08-14 DIAGNOSIS — G8929 Other chronic pain: Secondary | ICD-10-CM | POA: Diagnosis not present

## 2015-08-14 DIAGNOSIS — M25561 Pain in right knee: Secondary | ICD-10-CM | POA: Diagnosis not present

## 2015-08-16 DIAGNOSIS — R262 Difficulty in walking, not elsewhere classified: Secondary | ICD-10-CM | POA: Diagnosis not present

## 2015-08-16 DIAGNOSIS — M25561 Pain in right knee: Secondary | ICD-10-CM | POA: Diagnosis not present

## 2015-08-16 DIAGNOSIS — G8929 Other chronic pain: Secondary | ICD-10-CM | POA: Diagnosis not present

## 2015-08-16 DIAGNOSIS — M25661 Stiffness of right knee, not elsewhere classified: Secondary | ICD-10-CM | POA: Diagnosis not present

## 2015-08-20 DIAGNOSIS — M25661 Stiffness of right knee, not elsewhere classified: Secondary | ICD-10-CM | POA: Diagnosis not present

## 2015-08-20 DIAGNOSIS — G8929 Other chronic pain: Secondary | ICD-10-CM | POA: Diagnosis not present

## 2015-08-20 DIAGNOSIS — R262 Difficulty in walking, not elsewhere classified: Secondary | ICD-10-CM | POA: Diagnosis not present

## 2015-08-20 DIAGNOSIS — M25561 Pain in right knee: Secondary | ICD-10-CM | POA: Diagnosis not present

## 2015-08-23 DIAGNOSIS — G8929 Other chronic pain: Secondary | ICD-10-CM | POA: Diagnosis not present

## 2015-08-23 DIAGNOSIS — M25661 Stiffness of right knee, not elsewhere classified: Secondary | ICD-10-CM | POA: Diagnosis not present

## 2015-08-23 DIAGNOSIS — R262 Difficulty in walking, not elsewhere classified: Secondary | ICD-10-CM | POA: Diagnosis not present

## 2015-08-23 DIAGNOSIS — M25561 Pain in right knee: Secondary | ICD-10-CM | POA: Diagnosis not present

## 2015-08-27 DIAGNOSIS — M25561 Pain in right knee: Secondary | ICD-10-CM | POA: Diagnosis not present

## 2015-08-27 DIAGNOSIS — K219 Gastro-esophageal reflux disease without esophagitis: Secondary | ICD-10-CM | POA: Diagnosis not present

## 2015-08-27 DIAGNOSIS — Z0000 Encounter for general adult medical examination without abnormal findings: Secondary | ICD-10-CM | POA: Diagnosis not present

## 2015-08-27 DIAGNOSIS — E7800 Pure hypercholesterolemia, unspecified: Secondary | ICD-10-CM | POA: Diagnosis not present

## 2015-08-27 DIAGNOSIS — Z853 Personal history of malignant neoplasm of breast: Secondary | ICD-10-CM | POA: Diagnosis not present

## 2015-08-27 DIAGNOSIS — G8929 Other chronic pain: Secondary | ICD-10-CM | POA: Diagnosis not present

## 2015-08-27 DIAGNOSIS — E119 Type 2 diabetes mellitus without complications: Secondary | ICD-10-CM | POA: Diagnosis not present

## 2015-08-27 DIAGNOSIS — M25661 Stiffness of right knee, not elsewhere classified: Secondary | ICD-10-CM | POA: Diagnosis not present

## 2015-08-27 DIAGNOSIS — E049 Nontoxic goiter, unspecified: Secondary | ICD-10-CM | POA: Diagnosis not present

## 2015-08-27 DIAGNOSIS — R262 Difficulty in walking, not elsewhere classified: Secondary | ICD-10-CM | POA: Diagnosis not present

## 2015-08-30 DIAGNOSIS — M25661 Stiffness of right knee, not elsewhere classified: Secondary | ICD-10-CM | POA: Diagnosis not present

## 2015-08-30 DIAGNOSIS — G8929 Other chronic pain: Secondary | ICD-10-CM | POA: Diagnosis not present

## 2015-08-30 DIAGNOSIS — M25561 Pain in right knee: Secondary | ICD-10-CM | POA: Diagnosis not present

## 2015-08-30 DIAGNOSIS — R262 Difficulty in walking, not elsewhere classified: Secondary | ICD-10-CM | POA: Diagnosis not present

## 2015-09-03 DIAGNOSIS — G8929 Other chronic pain: Secondary | ICD-10-CM | POA: Diagnosis not present

## 2015-09-03 DIAGNOSIS — M25561 Pain in right knee: Secondary | ICD-10-CM | POA: Diagnosis not present

## 2015-09-03 DIAGNOSIS — R262 Difficulty in walking, not elsewhere classified: Secondary | ICD-10-CM | POA: Diagnosis not present

## 2015-09-03 DIAGNOSIS — M25661 Stiffness of right knee, not elsewhere classified: Secondary | ICD-10-CM | POA: Diagnosis not present

## 2015-09-06 DIAGNOSIS — R262 Difficulty in walking, not elsewhere classified: Secondary | ICD-10-CM | POA: Diagnosis not present

## 2015-09-06 DIAGNOSIS — M25661 Stiffness of right knee, not elsewhere classified: Secondary | ICD-10-CM | POA: Diagnosis not present

## 2015-09-06 DIAGNOSIS — M25561 Pain in right knee: Secondary | ICD-10-CM | POA: Diagnosis not present

## 2015-09-06 DIAGNOSIS — G8929 Other chronic pain: Secondary | ICD-10-CM | POA: Diagnosis not present

## 2015-09-13 DIAGNOSIS — R262 Difficulty in walking, not elsewhere classified: Secondary | ICD-10-CM | POA: Diagnosis not present

## 2015-09-13 DIAGNOSIS — G8929 Other chronic pain: Secondary | ICD-10-CM | POA: Diagnosis not present

## 2015-09-13 DIAGNOSIS — M25661 Stiffness of right knee, not elsewhere classified: Secondary | ICD-10-CM | POA: Diagnosis not present

## 2015-09-13 DIAGNOSIS — M25561 Pain in right knee: Secondary | ICD-10-CM | POA: Diagnosis not present

## 2015-10-02 DIAGNOSIS — R262 Difficulty in walking, not elsewhere classified: Secondary | ICD-10-CM | POA: Diagnosis not present

## 2015-10-02 DIAGNOSIS — M25561 Pain in right knee: Secondary | ICD-10-CM | POA: Diagnosis not present

## 2015-10-02 DIAGNOSIS — G8929 Other chronic pain: Secondary | ICD-10-CM | POA: Diagnosis not present

## 2015-10-02 DIAGNOSIS — M25661 Stiffness of right knee, not elsewhere classified: Secondary | ICD-10-CM | POA: Diagnosis not present

## 2015-10-15 ENCOUNTER — Other Ambulatory Visit: Payer: Self-pay | Admitting: Family Medicine

## 2015-10-15 DIAGNOSIS — N63 Unspecified lump in unspecified breast: Secondary | ICD-10-CM

## 2015-11-20 DIAGNOSIS — L299 Pruritus, unspecified: Secondary | ICD-10-CM | POA: Diagnosis not present

## 2015-11-20 DIAGNOSIS — Z23 Encounter for immunization: Secondary | ICD-10-CM | POA: Diagnosis not present

## 2015-11-20 DIAGNOSIS — R0789 Other chest pain: Secondary | ICD-10-CM | POA: Diagnosis not present

## 2015-11-21 ENCOUNTER — Ambulatory Visit
Admission: RE | Admit: 2015-11-21 | Discharge: 2015-11-21 | Disposition: A | Discharge status: Home / Self Care | Payer: Medicare Other | Source: Ambulatory Visit | Attending: Family Medicine | Admitting: Family Medicine

## 2015-11-21 ENCOUNTER — Other Ambulatory Visit: Payer: Self-pay | Admitting: Family Medicine

## 2015-11-21 DIAGNOSIS — N63 Unspecified lump in unspecified breast: Secondary | ICD-10-CM

## 2015-11-21 DIAGNOSIS — N6489 Other specified disorders of breast: Secondary | ICD-10-CM | POA: Diagnosis not present

## 2015-11-21 DIAGNOSIS — R928 Other abnormal and inconclusive findings on diagnostic imaging of breast: Secondary | ICD-10-CM

## 2015-11-28 ENCOUNTER — Other Ambulatory Visit: Payer: Self-pay | Admitting: Family Medicine

## 2015-11-28 DIAGNOSIS — N63 Unspecified lump in unspecified breast: Secondary | ICD-10-CM

## 2015-11-29 ENCOUNTER — Ambulatory Visit
Admission: RE | Admit: 2015-11-29 | Discharge: 2015-11-29 | Disposition: A | Discharge status: Home / Self Care | Payer: Medicare Other | Source: Ambulatory Visit | Attending: Family Medicine | Admitting: Family Medicine

## 2015-11-29 DIAGNOSIS — N63 Unspecified lump in unspecified breast: Secondary | ICD-10-CM

## 2015-12-11 DIAGNOSIS — M545 Low back pain: Secondary | ICD-10-CM | POA: Diagnosis not present

## 2015-12-11 DIAGNOSIS — M542 Cervicalgia: Secondary | ICD-10-CM | POA: Diagnosis not present

## 2015-12-13 ENCOUNTER — Other Ambulatory Visit: Payer: Self-pay | Admitting: Orthopedic Surgery

## 2015-12-13 DIAGNOSIS — M542 Cervicalgia: Secondary | ICD-10-CM

## 2015-12-23 ENCOUNTER — Ambulatory Visit
Admission: RE | Admit: 2015-12-23 | Discharge: 2015-12-23 | Disposition: A | Discharge status: Home / Self Care | Payer: Medicare Other | Source: Ambulatory Visit | Attending: Orthopedic Surgery | Admitting: Orthopedic Surgery

## 2015-12-23 DIAGNOSIS — M542 Cervicalgia: Secondary | ICD-10-CM

## 2015-12-23 DIAGNOSIS — M50222 Other cervical disc displacement at C5-C6 level: Secondary | ICD-10-CM | POA: Diagnosis not present

## 2015-12-28 DIAGNOSIS — G959 Disease of spinal cord, unspecified: Secondary | ICD-10-CM | POA: Diagnosis not present

## 2016-01-02 DIAGNOSIS — Z471 Aftercare following joint replacement surgery: Secondary | ICD-10-CM | POA: Diagnosis not present

## 2016-01-02 DIAGNOSIS — Z96651 Presence of right artificial knee joint: Secondary | ICD-10-CM | POA: Diagnosis not present

## 2016-01-04 DIAGNOSIS — M542 Cervicalgia: Secondary | ICD-10-CM | POA: Diagnosis not present

## 2016-01-04 DIAGNOSIS — M545 Low back pain: Secondary | ICD-10-CM | POA: Diagnosis not present

## 2016-01-09 DIAGNOSIS — M542 Cervicalgia: Secondary | ICD-10-CM | POA: Diagnosis not present

## 2016-01-09 DIAGNOSIS — M545 Low back pain: Secondary | ICD-10-CM | POA: Diagnosis not present

## 2016-01-14 DIAGNOSIS — M545 Low back pain: Secondary | ICD-10-CM | POA: Diagnosis not present

## 2016-01-14 DIAGNOSIS — M542 Cervicalgia: Secondary | ICD-10-CM | POA: Diagnosis not present

## 2016-01-14 DIAGNOSIS — G8929 Other chronic pain: Secondary | ICD-10-CM | POA: Diagnosis not present

## 2016-01-24 DIAGNOSIS — M545 Low back pain: Secondary | ICD-10-CM | POA: Diagnosis not present

## 2016-01-24 DIAGNOSIS — M542 Cervicalgia: Secondary | ICD-10-CM | POA: Diagnosis not present

## 2016-02-04 ENCOUNTER — Other Ambulatory Visit: Payer: Self-pay | Admitting: Family Medicine

## 2016-02-04 DIAGNOSIS — N6489 Other specified disorders of breast: Secondary | ICD-10-CM

## 2016-02-14 DIAGNOSIS — M542 Cervicalgia: Secondary | ICD-10-CM | POA: Diagnosis not present

## 2016-02-14 DIAGNOSIS — M545 Low back pain: Secondary | ICD-10-CM | POA: Diagnosis not present

## 2016-02-18 ENCOUNTER — Other Ambulatory Visit: Payer: Medicare Other

## 2016-02-20 ENCOUNTER — Ambulatory Visit
Admission: RE | Admit: 2016-02-20 | Discharge: 2016-02-20 | Disposition: A | Discharge status: Home / Self Care | Payer: Medicare Other | Source: Ambulatory Visit | Attending: Family Medicine | Admitting: Family Medicine

## 2016-02-20 DIAGNOSIS — N6489 Other specified disorders of breast: Secondary | ICD-10-CM | POA: Diagnosis not present

## 2016-02-20 MED ORDER — GADOBENATE DIMEGLUMINE 529 MG/ML IV SOLN
20.0000 mL | Freq: Once | INTRAVENOUS | Status: AC | PRN
  Administered 2016-02-20: 20 mL via INTRAVENOUS

## 2016-03-11 DIAGNOSIS — E669 Obesity, unspecified: Secondary | ICD-10-CM | POA: Diagnosis not present

## 2016-03-11 DIAGNOSIS — E7800 Pure hypercholesterolemia, unspecified: Secondary | ICD-10-CM | POA: Diagnosis not present

## 2016-03-11 DIAGNOSIS — Z853 Personal history of malignant neoplasm of breast: Secondary | ICD-10-CM | POA: Diagnosis not present

## 2016-03-11 DIAGNOSIS — E119 Type 2 diabetes mellitus without complications: Secondary | ICD-10-CM | POA: Diagnosis not present

## 2016-03-11 DIAGNOSIS — K219 Gastro-esophageal reflux disease without esophagitis: Secondary | ICD-10-CM | POA: Diagnosis not present

## 2016-04-23 ENCOUNTER — Other Ambulatory Visit: Payer: Self-pay | Admitting: Family Medicine

## 2016-04-23 DIAGNOSIS — N6489 Other specified disorders of breast: Secondary | ICD-10-CM

## 2016-05-28 ENCOUNTER — Ambulatory Visit
Admission: RE | Admit: 2016-05-28 | Discharge: 2016-05-28 | Disposition: A | Discharge status: Home / Self Care | Payer: Medicare Other | Source: Ambulatory Visit | Attending: Family Medicine | Admitting: Family Medicine

## 2016-05-28 DIAGNOSIS — R922 Inconclusive mammogram: Secondary | ICD-10-CM | POA: Diagnosis not present

## 2016-05-28 DIAGNOSIS — N6489 Other specified disorders of breast: Secondary | ICD-10-CM

## 2016-05-29 DIAGNOSIS — M542 Cervicalgia: Secondary | ICD-10-CM | POA: Diagnosis not present

## 2016-05-29 DIAGNOSIS — G959 Disease of spinal cord, unspecified: Secondary | ICD-10-CM | POA: Diagnosis not present

## 2016-06-05 DIAGNOSIS — H2513 Age-related nuclear cataract, bilateral: Secondary | ICD-10-CM | POA: Diagnosis not present

## 2016-06-05 DIAGNOSIS — H401132 Primary open-angle glaucoma, bilateral, moderate stage: Secondary | ICD-10-CM | POA: Diagnosis not present

## 2016-06-23 DIAGNOSIS — G959 Disease of spinal cord, unspecified: Secondary | ICD-10-CM | POA: Diagnosis not present

## 2016-07-08 DIAGNOSIS — G959 Disease of spinal cord, unspecified: Secondary | ICD-10-CM | POA: Diagnosis not present

## 2016-07-09 DIAGNOSIS — M25561 Pain in right knee: Secondary | ICD-10-CM | POA: Diagnosis not present

## 2016-07-09 DIAGNOSIS — M2341 Loose body in knee, right knee: Secondary | ICD-10-CM | POA: Diagnosis not present

## 2016-07-09 DIAGNOSIS — M7651 Patellar tendinitis, right knee: Secondary | ICD-10-CM | POA: Diagnosis not present

## 2016-07-09 DIAGNOSIS — M25461 Effusion, right knee: Secondary | ICD-10-CM | POA: Diagnosis not present

## 2016-07-09 DIAGNOSIS — Z96651 Presence of right artificial knee joint: Secondary | ICD-10-CM | POA: Diagnosis not present

## 2016-07-09 DIAGNOSIS — Z471 Aftercare following joint replacement surgery: Secondary | ICD-10-CM | POA: Diagnosis not present

## 2016-08-08 DIAGNOSIS — Z87891 Personal history of nicotine dependence: Secondary | ICD-10-CM | POA: Diagnosis not present

## 2016-08-08 DIAGNOSIS — I10 Essential (primary) hypertension: Secondary | ICD-10-CM | POA: Diagnosis not present

## 2016-08-08 DIAGNOSIS — G9589 Other specified diseases of spinal cord: Secondary | ICD-10-CM | POA: Diagnosis not present

## 2016-08-08 DIAGNOSIS — Z79899 Other long term (current) drug therapy: Secondary | ICD-10-CM | POA: Diagnosis not present

## 2016-08-08 DIAGNOSIS — Z01818 Encounter for other preprocedural examination: Secondary | ICD-10-CM | POA: Diagnosis not present

## 2016-08-08 DIAGNOSIS — E119 Type 2 diabetes mellitus without complications: Secondary | ICD-10-CM | POA: Diagnosis not present

## 2016-08-15 DIAGNOSIS — I10 Essential (primary) hypertension: Secondary | ICD-10-CM | POA: Diagnosis present

## 2016-08-15 DIAGNOSIS — Z7984 Long term (current) use of oral hypoglycemic drugs: Secondary | ICD-10-CM | POA: Diagnosis not present

## 2016-08-15 DIAGNOSIS — E119 Type 2 diabetes mellitus without complications: Secondary | ICD-10-CM | POA: Diagnosis present

## 2016-08-15 DIAGNOSIS — Z79899 Other long term (current) drug therapy: Secondary | ICD-10-CM | POA: Diagnosis not present

## 2016-08-15 DIAGNOSIS — M47812 Spondylosis without myelopathy or radiculopathy, cervical region: Secondary | ICD-10-CM | POA: Diagnosis not present

## 2016-08-15 DIAGNOSIS — Z6837 Body mass index (BMI) 37.0-37.9, adult: Secondary | ICD-10-CM | POA: Diagnosis not present

## 2016-08-15 DIAGNOSIS — Z87891 Personal history of nicotine dependence: Secondary | ICD-10-CM | POA: Diagnosis not present

## 2016-08-15 DIAGNOSIS — G959 Disease of spinal cord, unspecified: Secondary | ICD-10-CM | POA: Insufficient documentation

## 2016-08-15 DIAGNOSIS — Z9889 Other specified postprocedural states: Secondary | ICD-10-CM | POA: Diagnosis not present

## 2016-08-15 DIAGNOSIS — Z880 Allergy status to penicillin: Secondary | ICD-10-CM | POA: Diagnosis not present

## 2016-08-15 DIAGNOSIS — E669 Obesity, unspecified: Secondary | ICD-10-CM | POA: Diagnosis present

## 2016-08-15 DIAGNOSIS — E785 Hyperlipidemia, unspecified: Secondary | ICD-10-CM | POA: Diagnosis present

## 2016-08-15 DIAGNOSIS — Z48811 Encounter for surgical aftercare following surgery on the nervous system: Secondary | ICD-10-CM | POA: Diagnosis not present

## 2016-08-15 DIAGNOSIS — M4712 Other spondylosis with myelopathy, cervical region: Secondary | ICD-10-CM | POA: Diagnosis present

## 2016-08-15 DIAGNOSIS — Z981 Arthrodesis status: Secondary | ICD-10-CM | POA: Diagnosis not present

## 2016-11-04 DIAGNOSIS — M47812 Spondylosis without myelopathy or radiculopathy, cervical region: Secondary | ICD-10-CM | POA: Diagnosis not present

## 2016-11-04 DIAGNOSIS — M47892 Other spondylosis, cervical region: Secondary | ICD-10-CM | POA: Diagnosis not present

## 2016-11-04 DIAGNOSIS — Z981 Arthrodesis status: Secondary | ICD-10-CM | POA: Diagnosis not present

## 2016-12-10 DIAGNOSIS — K219 Gastro-esophageal reflux disease without esophagitis: Secondary | ICD-10-CM | POA: Diagnosis not present

## 2016-12-10 DIAGNOSIS — Z1389 Encounter for screening for other disorder: Secondary | ICD-10-CM | POA: Diagnosis not present

## 2016-12-10 DIAGNOSIS — Z0000 Encounter for general adult medical examination without abnormal findings: Secondary | ICD-10-CM | POA: Diagnosis not present

## 2016-12-10 DIAGNOSIS — E049 Nontoxic goiter, unspecified: Secondary | ICD-10-CM | POA: Diagnosis not present

## 2016-12-10 DIAGNOSIS — Z7984 Long term (current) use of oral hypoglycemic drugs: Secondary | ICD-10-CM | POA: Diagnosis not present

## 2016-12-10 DIAGNOSIS — Z853 Personal history of malignant neoplasm of breast: Secondary | ICD-10-CM | POA: Diagnosis not present

## 2016-12-10 DIAGNOSIS — Z23 Encounter for immunization: Secondary | ICD-10-CM | POA: Diagnosis not present

## 2016-12-10 DIAGNOSIS — E7800 Pure hypercholesterolemia, unspecified: Secondary | ICD-10-CM | POA: Diagnosis not present

## 2016-12-10 DIAGNOSIS — E1165 Type 2 diabetes mellitus with hyperglycemia: Secondary | ICD-10-CM | POA: Diagnosis not present

## 2017-05-20 DIAGNOSIS — H401132 Primary open-angle glaucoma, bilateral, moderate stage: Secondary | ICD-10-CM | POA: Diagnosis not present

## 2017-05-20 DIAGNOSIS — H2513 Age-related nuclear cataract, bilateral: Secondary | ICD-10-CM | POA: Diagnosis not present

## 2017-06-12 DIAGNOSIS — E669 Obesity, unspecified: Secondary | ICD-10-CM | POA: Diagnosis not present

## 2017-06-12 DIAGNOSIS — E7800 Pure hypercholesterolemia, unspecified: Secondary | ICD-10-CM | POA: Diagnosis not present

## 2017-06-12 DIAGNOSIS — K219 Gastro-esophageal reflux disease without esophagitis: Secondary | ICD-10-CM | POA: Diagnosis not present

## 2017-06-12 DIAGNOSIS — E119 Type 2 diabetes mellitus without complications: Secondary | ICD-10-CM | POA: Diagnosis not present

## 2017-06-12 DIAGNOSIS — Z7984 Long term (current) use of oral hypoglycemic drugs: Secondary | ICD-10-CM | POA: Diagnosis not present

## 2017-06-17 DIAGNOSIS — M79671 Pain in right foot: Secondary | ICD-10-CM | POA: Diagnosis not present

## 2017-06-17 DIAGNOSIS — L603 Nail dystrophy: Secondary | ICD-10-CM | POA: Diagnosis not present

## 2017-06-17 DIAGNOSIS — I739 Peripheral vascular disease, unspecified: Secondary | ICD-10-CM | POA: Diagnosis not present

## 2017-06-17 DIAGNOSIS — L84 Corns and callosities: Secondary | ICD-10-CM | POA: Diagnosis not present

## 2017-06-17 DIAGNOSIS — M79672 Pain in left foot: Secondary | ICD-10-CM | POA: Diagnosis not present

## 2017-06-17 DIAGNOSIS — E1151 Type 2 diabetes mellitus with diabetic peripheral angiopathy without gangrene: Secondary | ICD-10-CM | POA: Diagnosis not present

## 2017-07-01 ENCOUNTER — Other Ambulatory Visit: Payer: Self-pay | Admitting: Family Medicine

## 2017-07-01 DIAGNOSIS — R928 Other abnormal and inconclusive findings on diagnostic imaging of breast: Secondary | ICD-10-CM

## 2017-07-14 ENCOUNTER — Ambulatory Visit
Admission: RE | Admit: 2017-07-14 | Discharge: 2017-07-14 | Disposition: A | Discharge status: Home / Self Care | Payer: Medicare Other | Source: Ambulatory Visit | Attending: Family Medicine | Admitting: Family Medicine

## 2017-07-14 DIAGNOSIS — R928 Other abnormal and inconclusive findings on diagnostic imaging of breast: Secondary | ICD-10-CM

## 2017-07-14 DIAGNOSIS — R922 Inconclusive mammogram: Secondary | ICD-10-CM | POA: Diagnosis not present

## 2017-09-16 DIAGNOSIS — L84 Corns and callosities: Secondary | ICD-10-CM | POA: Diagnosis not present

## 2017-09-16 DIAGNOSIS — I739 Peripheral vascular disease, unspecified: Secondary | ICD-10-CM | POA: Diagnosis not present

## 2017-09-16 DIAGNOSIS — L603 Nail dystrophy: Secondary | ICD-10-CM | POA: Diagnosis not present

## 2017-09-16 DIAGNOSIS — E1151 Type 2 diabetes mellitus with diabetic peripheral angiopathy without gangrene: Secondary | ICD-10-CM | POA: Diagnosis not present

## 2017-11-19 DIAGNOSIS — E119 Type 2 diabetes mellitus without complications: Secondary | ICD-10-CM | POA: Diagnosis not present

## 2017-11-19 DIAGNOSIS — H43813 Vitreous degeneration, bilateral: Secondary | ICD-10-CM | POA: Diagnosis not present

## 2017-11-19 DIAGNOSIS — H2513 Age-related nuclear cataract, bilateral: Secondary | ICD-10-CM | POA: Diagnosis not present

## 2017-11-19 DIAGNOSIS — H401132 Primary open-angle glaucoma, bilateral, moderate stage: Secondary | ICD-10-CM | POA: Diagnosis not present

## 2018-01-05 DIAGNOSIS — M4316 Spondylolisthesis, lumbar region: Secondary | ICD-10-CM | POA: Diagnosis not present

## 2018-01-05 DIAGNOSIS — M5136 Other intervertebral disc degeneration, lumbar region: Secondary | ICD-10-CM | POA: Diagnosis not present

## 2018-01-05 DIAGNOSIS — Z96651 Presence of right artificial knee joint: Secondary | ICD-10-CM | POA: Diagnosis not present

## 2018-01-05 DIAGNOSIS — M2578 Osteophyte, vertebrae: Secondary | ICD-10-CM | POA: Diagnosis not present

## 2018-01-05 DIAGNOSIS — M5442 Lumbago with sciatica, left side: Secondary | ICD-10-CM | POA: Diagnosis not present

## 2018-01-05 DIAGNOSIS — M1712 Unilateral primary osteoarthritis, left knee: Secondary | ICD-10-CM | POA: Diagnosis not present

## 2018-01-11 DIAGNOSIS — M1712 Unilateral primary osteoarthritis, left knee: Secondary | ICD-10-CM | POA: Diagnosis not present

## 2018-01-11 DIAGNOSIS — M5136 Other intervertebral disc degeneration, lumbar region: Secondary | ICD-10-CM | POA: Diagnosis not present

## 2018-01-11 DIAGNOSIS — Z96651 Presence of right artificial knee joint: Secondary | ICD-10-CM | POA: Diagnosis not present

## 2018-01-11 DIAGNOSIS — M4316 Spondylolisthesis, lumbar region: Secondary | ICD-10-CM | POA: Diagnosis not present

## 2018-02-02 ENCOUNTER — Other Ambulatory Visit: Payer: Self-pay | Admitting: Family Medicine

## 2018-02-02 DIAGNOSIS — E049 Nontoxic goiter, unspecified: Secondary | ICD-10-CM | POA: Diagnosis not present

## 2018-02-02 DIAGNOSIS — Z23 Encounter for immunization: Secondary | ICD-10-CM | POA: Diagnosis not present

## 2018-02-02 DIAGNOSIS — Z0000 Encounter for general adult medical examination without abnormal findings: Secondary | ICD-10-CM | POA: Diagnosis not present

## 2018-02-02 DIAGNOSIS — K219 Gastro-esophageal reflux disease without esophagitis: Secondary | ICD-10-CM | POA: Diagnosis not present

## 2018-02-02 DIAGNOSIS — E669 Obesity, unspecified: Secondary | ICD-10-CM | POA: Diagnosis not present

## 2018-02-02 DIAGNOSIS — R32 Unspecified urinary incontinence: Secondary | ICD-10-CM | POA: Diagnosis not present

## 2018-02-02 DIAGNOSIS — E1169 Type 2 diabetes mellitus with other specified complication: Secondary | ICD-10-CM | POA: Diagnosis not present

## 2018-02-02 DIAGNOSIS — E7800 Pure hypercholesterolemia, unspecified: Secondary | ICD-10-CM | POA: Diagnosis not present

## 2018-02-02 DIAGNOSIS — Z1389 Encounter for screening for other disorder: Secondary | ICD-10-CM | POA: Diagnosis not present

## 2018-02-02 DIAGNOSIS — Z13820 Encounter for screening for osteoporosis: Secondary | ICD-10-CM

## 2018-02-02 DIAGNOSIS — Z853 Personal history of malignant neoplasm of breast: Secondary | ICD-10-CM | POA: Diagnosis not present

## 2018-02-02 DIAGNOSIS — M1990 Unspecified osteoarthritis, unspecified site: Secondary | ICD-10-CM | POA: Diagnosis not present

## 2018-02-19 DIAGNOSIS — I739 Peripheral vascular disease, unspecified: Secondary | ICD-10-CM | POA: Diagnosis not present

## 2018-02-19 DIAGNOSIS — L97522 Non-pressure chronic ulcer of other part of left foot with fat layer exposed: Secondary | ICD-10-CM | POA: Diagnosis not present

## 2018-02-19 DIAGNOSIS — L603 Nail dystrophy: Secondary | ICD-10-CM | POA: Diagnosis not present

## 2018-02-19 DIAGNOSIS — E1151 Type 2 diabetes mellitus with diabetic peripheral angiopathy without gangrene: Secondary | ICD-10-CM | POA: Diagnosis not present

## 2018-02-19 DIAGNOSIS — L84 Corns and callosities: Secondary | ICD-10-CM | POA: Diagnosis not present

## 2018-03-31 ENCOUNTER — Ambulatory Visit
Admission: RE | Admit: 2018-03-31 | Discharge: 2018-03-31 | Disposition: A | Discharge status: Home / Self Care | Payer: PRIVATE HEALTH INSURANCE | Source: Ambulatory Visit | Attending: Family Medicine | Admitting: Family Medicine

## 2018-03-31 DIAGNOSIS — Z13820 Encounter for screening for osteoporosis: Secondary | ICD-10-CM

## 2018-08-19 ENCOUNTER — Other Ambulatory Visit: Payer: Self-pay | Admitting: Family Medicine

## 2018-08-19 DIAGNOSIS — Z1231 Encounter for screening mammogram for malignant neoplasm of breast: Secondary | ICD-10-CM

## 2018-10-06 ENCOUNTER — Ambulatory Visit
Admission: RE | Admit: 2018-10-06 | Discharge: 2018-10-06 | Disposition: A | Discharge status: Home / Self Care | Payer: PRIVATE HEALTH INSURANCE | Source: Ambulatory Visit | Attending: Family Medicine | Admitting: Family Medicine

## 2018-10-06 ENCOUNTER — Other Ambulatory Visit: Payer: Self-pay

## 2018-10-06 DIAGNOSIS — Z1231 Encounter for screening mammogram for malignant neoplasm of breast: Secondary | ICD-10-CM

## 2018-12-11 IMAGING — MR MR BILATERAL BREAST WITHOUT AND WITH CONTRAST
8 of 12 series · 33 of 48 positions shown · IV contrast (multihance)
Comparison: 11/21/2015, 05/14/2015, 05/12/2014, and earlier
mammograms studies; prior MRI performed 01/07/2011

CLINICAL DATA: Questioned asymmetry on recent mammographic
evaluation of the right breast. When the patient returned for
stereotactic guided core biopsy, the asymmetry could not be reliably
reproduced. History of left lumpectomy for low-grade DCIS 3903.

LABS:  Creatinine was obtained on site at [HOSPITAL] at [REDACTED] [HOSPITAL].
Results: Creatinine 0.8 mg/dL.
EXAM:
BILATERAL BREAST MRI WITH AND WITHOUT CONTRAST
TECHNIQUE: Multiplanar, multisequence MR images of both breasts were obtained
prior to and following the intravenous administration of 20 ml of
MultiHance.

[Series 2: t2_tirm_tra ipat (a-p) · axial · 3.0mm · 0.70mm/px · 1 of 55 slices shown]
[im 1/55]
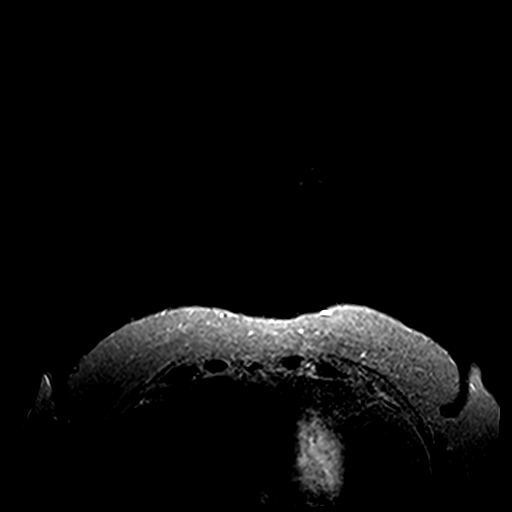

[Series 4: fl3d pre-cm no · axial · non-contrast · 1.2mm · 0.94mm/px · z∈[-64,+108]mm · 5 of 144 slices shown]
[im 1/144]
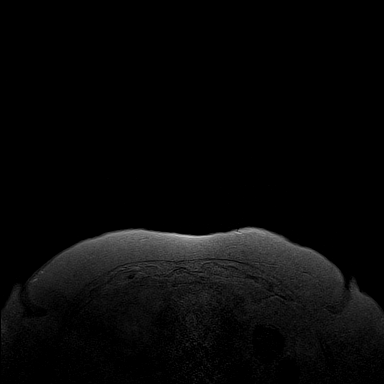
[im 36/144]
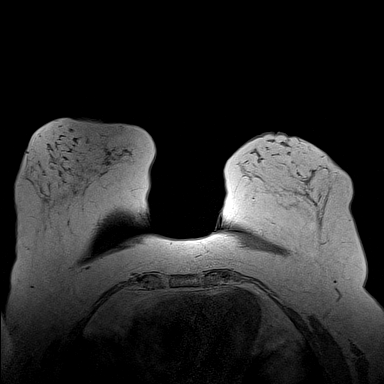
[im 72/144]
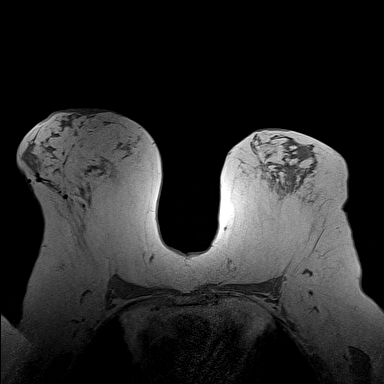
[im 108/144]
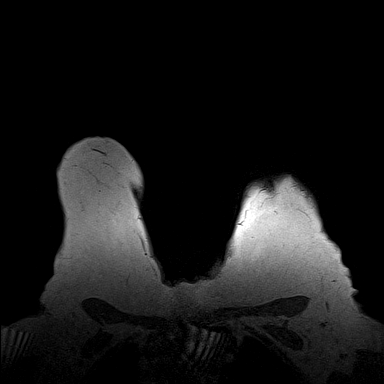
[im 144/144]
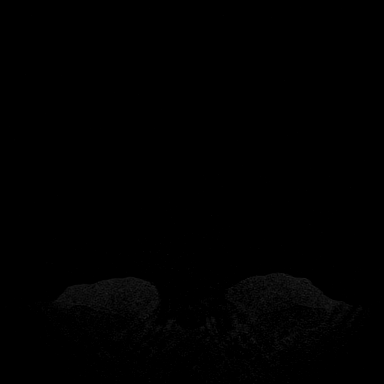

[Series 5: fl3d pre-cm · axial · non-contrast · 1.2mm · 0.94mm/px · z∈[-64,+108]mm · 5 of 144 slices shown]
[im 1/144]
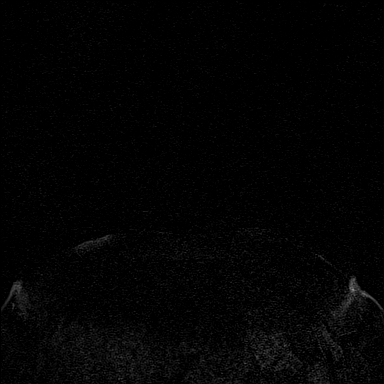
[im 36/144]
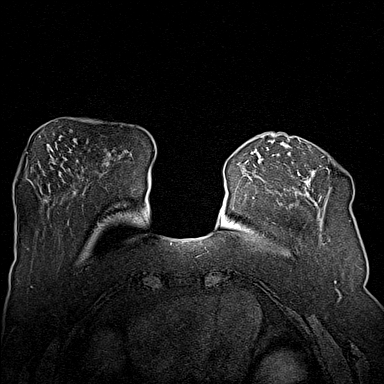
[im 72/144]
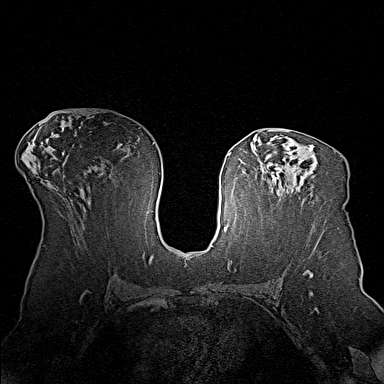
[im 108/144]
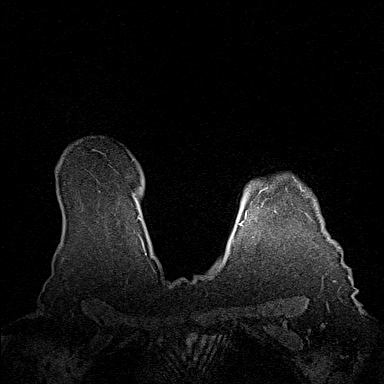
[im 144/144]
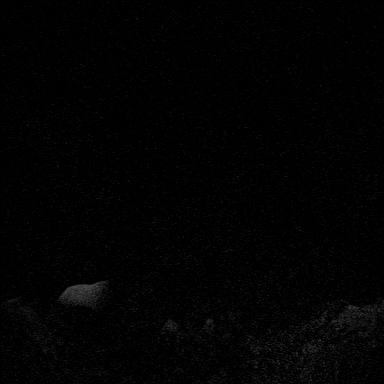

[Series 6: fl3d post-cm 20 · axial · 1.2mm · 0.94mm/px · z∈[-64,+108]mm · 5 of 144 slices shown (1 of 3)]
[im 1/144]
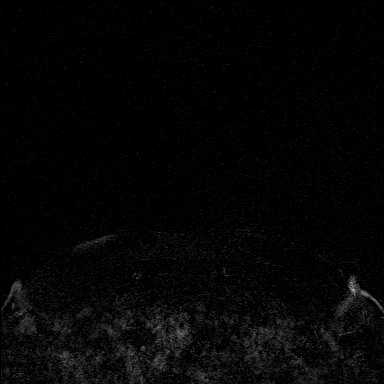
[im 36/144]
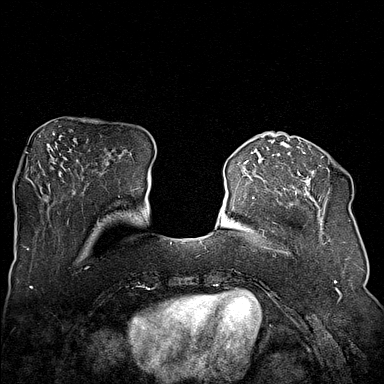
[im 72/144]
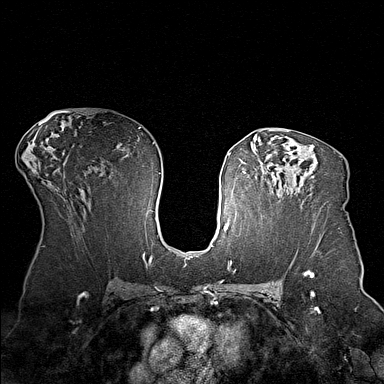
[im 108/144]
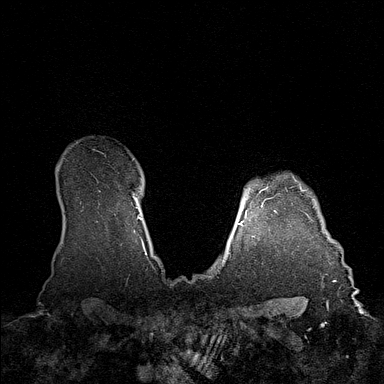
[im 144/144]
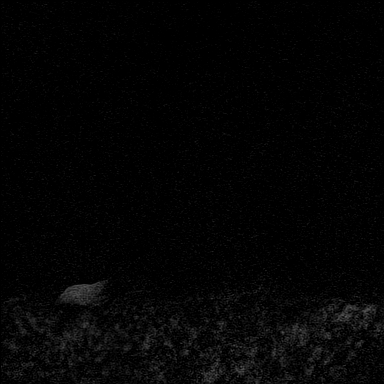

[Series 7: fl3d post-cm 20 · axial · 1.2mm · 0.94mm/px · z∈[-64,+108]mm · 5 of 144 slices shown (2 of 3)]
[im 1/144]
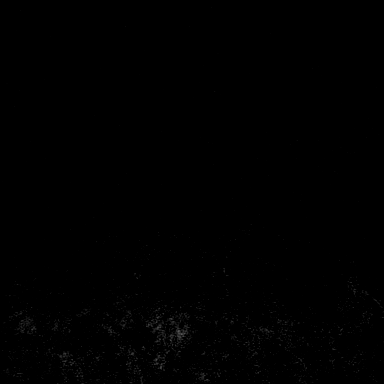
[im 36/144]
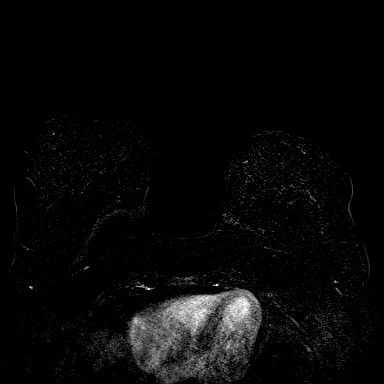
[im 72/144]
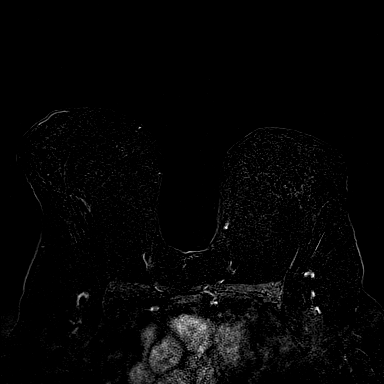
[im 108/144]
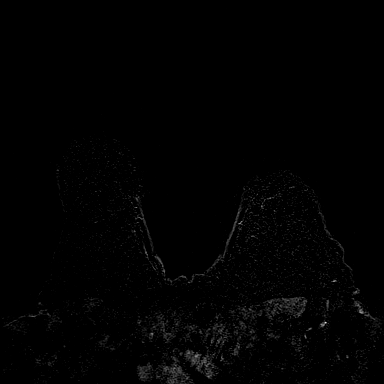
[im 144/144]
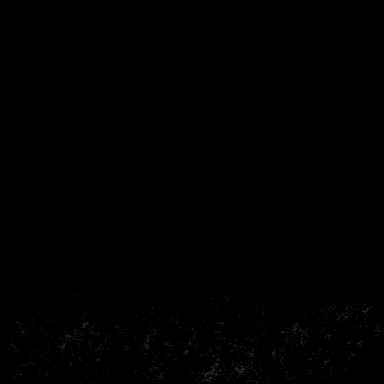

[Series 8: fl3d post-cm 20 · axial · 172.8mm · 0.94mm/px · 1 of 1 slices shown (3 of 3)]
[im 1/1]
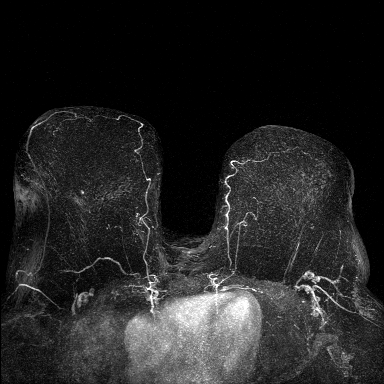

[Series 9: fl3d post-cm 3min · axial · 1.2mm · 0.94mm/px · z∈[-64,+108]mm · 6 of 144 slices shown]
[im 1/144]
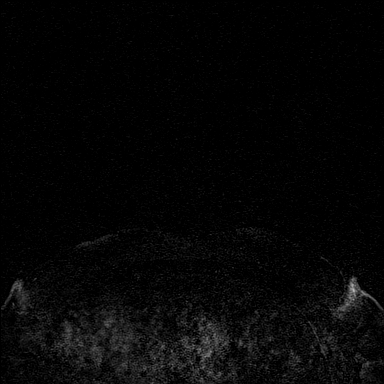
[im 29/144]
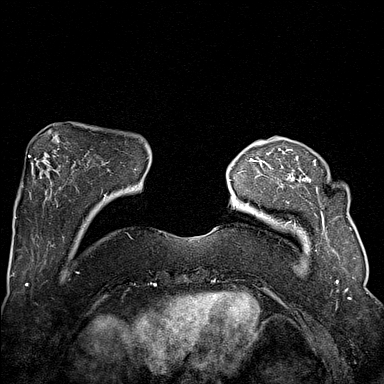
[im 58/144]
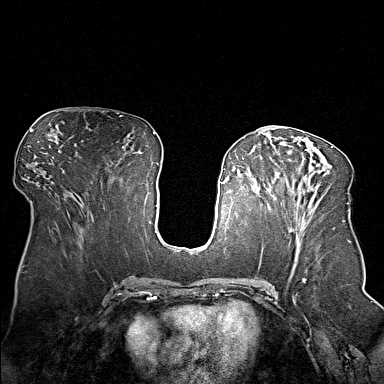
[im 86/144]
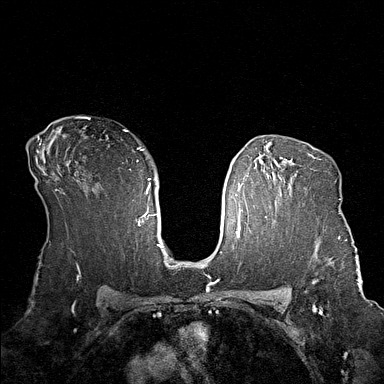
[im 115/144]
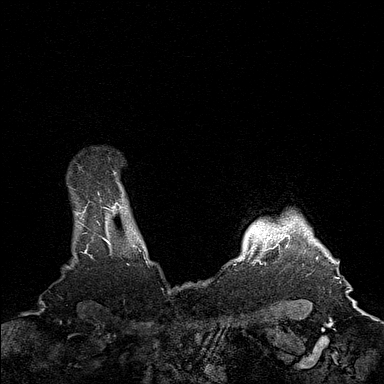
[im 144/144]
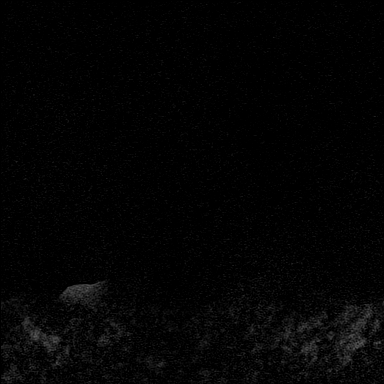

[Series 10: fl3d post-cm 3min_sub · axial · 1.2mm · 0.94mm/px · z∈[-64,+73]mm · 5 of 144 slices shown]
[im 1/144]
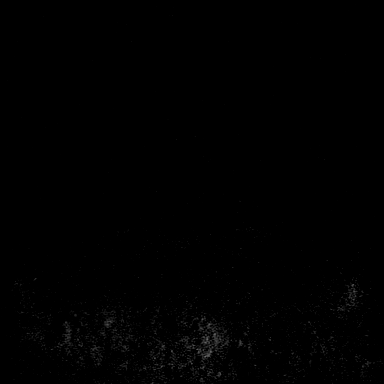
[im 29/144]
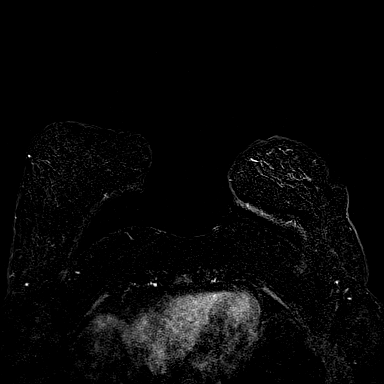
[im 58/144]
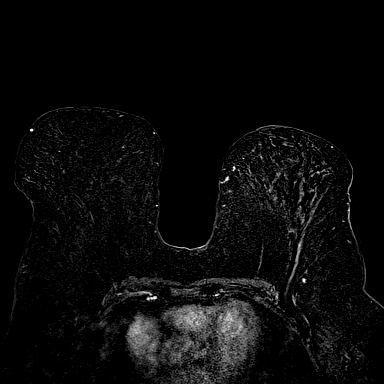
[im 86/144]
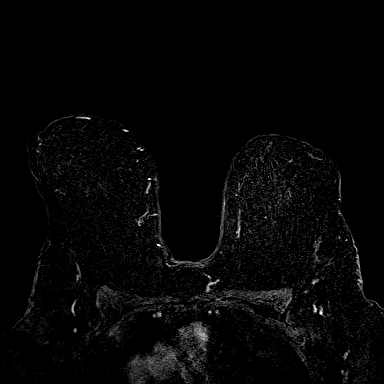
[im 115/144]
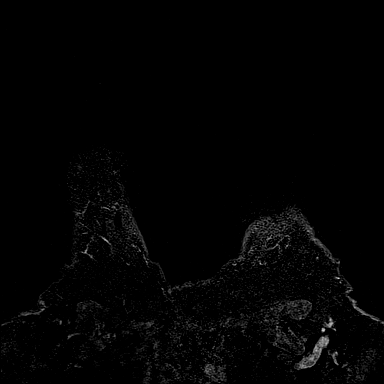

[33 of 48 positions shown; findings below may reference images not displayed]

THREE-DIMENSIONAL MR IMAGE RENDERING ON INDEPENDENT WORKSTATION:

Three-dimensional MR images were rendered by post-processing of the
original MR data on an independent workstation. The
three-dimensional MR images were interpreted, and findings are
reported in the following complete MRI report for this study. Three
dimensional images were evaluated at the independent [employer]
workstation
FINDINGS: Breast composition: b. Scattered fibroglandular tissue.

Background parenchymal enhancement: Minimal

Right breast: Scattered foci of enhancement are identified, similar
in appearance to the prior study. No suspicious enhancement
identified.

Left breast: Scattered foci of enhancement are identified, similar
in appearance to prior study. No suspicious enhancement identified.
Postoperative changes are identified in the lateral portion of the
left breast.

Lymph nodes: No abnormal appearing lymph nodes.

Ancillary findings:  None.
IMPRESSION: No suspicious findings on MRI of the breast.

RECOMMENDATION:
Per prior recommendation, right diagnostic mammogram is recommended
in May 2016 for evaluation of asymmetry.

BI-RADS CATEGORY  1: Negative.

## 2019-02-23 ENCOUNTER — Encounter: Payer: Self-pay | Admitting: Neurology

## 2019-02-24 ENCOUNTER — Other Ambulatory Visit: Payer: Self-pay

## 2019-02-24 ENCOUNTER — Telehealth: Payer: Self-pay | Admitting: Neurology

## 2019-02-24 ENCOUNTER — Encounter

## 2019-02-24 ENCOUNTER — Telehealth (INDEPENDENT_AMBULATORY_CARE_PROVIDER_SITE_OTHER): Payer: Medicare Other | Admitting: Neurology

## 2019-02-24 VITALS — Ht 63.0 in | Wt 190.0 lb

## 2019-02-24 DIAGNOSIS — F0391 Unspecified dementia with behavioral disturbance: Secondary | ICD-10-CM | POA: Diagnosis not present

## 2019-02-24 DIAGNOSIS — F03B18 Unspecified dementia, moderate, with other behavioral disturbance: Secondary | ICD-10-CM

## 2019-02-24 MED ORDER — MEMANTINE HCL 10 MG PO TABS: ORAL_TABLET | ORAL | 11 refills | Status: DC

## 2019-02-24 NOTE — Telephone Encounter (Signed)
Per Dr. Delice Lesch referral sent to [facility]

## 2019-02-24 NOTE — Telephone Encounter (Signed)
Daughter called regarding this patient and needing to see if she can have an in home nurse. If arrangements can be made? Please Call. Thank you

## 2019-02-24 NOTE — Progress Notes (Signed)
Virtual Visit via Video Note The purpose of this virtual visit is to provide medical care while limiting exposure to the novel coronavirus.    Consent was obtained for video visit:  Yes.   Answered questions that patient had about telehealth interaction:  Yes.   I discussed the limitations, risks, security and privacy concerns of performing an evaluation and management service by telemedicine. I also discussed with the patient that there may be a patient responsible charge related to this service. The patient expressed understanding and agreed to proceed.  Pt location: Home Physician Location: office Name of referring provider:  Kelton Pillar, MD I connected with [name] at patients initiation/request on 02/24/2019 at  9:00 AM EST by video enabled telemedicine application and verified that I am speaking with the correct person using two identifiers. Pt MRN:  [id number] Pt DOB:  12/03/1931 Video Participants:  [name];  [name] (daughter)   History of Present Illness:  This is an 85 year old ambidextrous right-hand dominant woman with a history of hypertension, hyperlipidemia, diabetes, breast cancer, presenting for evaluation of memory loss. She states her memory is "pretty well."  She states she usually remembers to take her medications. Her daughter [name] is present during the e-visit and provides a different story. She has been living with her family since 38. Family started noticing memory changes at least 5 years ago, worse in the past year. She would repeat herself, lose things frequently. In the Fall of last year, she would not come home until very late at night, they were now sure if she knew where she was. She started having difficulties managing finances. She had always been early paying her bills, however they found out her credit score went from 800 to 644 because she was forgetting to pay. [name] found out in March that bills were 2-3 months behind. For the past 2  years, she has had paranoia, accusing everybody of being a thief. She is convinced she saw her grandson reach over her one night to take money, which never happened. She states this is true and she asked him what he was there for at 4am last week. She states her grandson is cursing every time he comes in. She has not showered in 2.5 years and has not washed her hair this year. She only takes sponge baths. Her shower stall is full of things, their house looks like a hoarder house. They found out that her license expired in January 2020 but she continued to drive, so they took away her keys. She has not driven since the end of February. She denies getting lost, however [name] reports she called at least once last Fall asking how to get home from their local [place]. She denies burning food on the stove, [name] reminds her she has not cooked in a few weeks because she had burned something. She is irritated every single day. She states she feels fine until people ask me thing that are "not that." She was started on Donepezil 10mg  daily by her PCP. Several paternal aunts had Alzheimer's disease. Another relative (maternal grandmother's first cousin) had early onset dementia. No history of significant head injuries or alcohol use.   She has left shoulder pain. She has occasional numbness in her feet that resolves when standing. She fell a month ago when she tripped and was on the floor all night. It appears she had incontinence while on the floor, she does not remember this. She denies any  headaches, dizziness, diplopia, dysarthria/dysphagia, neck/back pain, focal numbness/tingling/weakness, bowel/bladder dysfunction, anosmia, or tremors.    PAST MEDICAL HISTORY: Past Medical History:  Diagnosis Date  . Arthritis    knees  . Bladder atony    uses a large pad for incontinence   . 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 GERD (gastroesophageal reflux disease)    Uses PPI- only on occasion   . History of hiatal  hernia   . Hypertension    for treatment, pt. unsure why this is noted in her hx.    . Normal cardiac stress test 2000   done at [facility]  . Thyroid nodule   . Thyroid nodule 2007   biopsy- benign    PAST SURGICAL HISTORY: Past Surgical History:  Procedure Laterality Date  . ADENOIDECTOMY    . BREAST BIOPSY  1974   right  . BREAST BIOPSY  1977   left  . BREAST EXCISIONAL BIOPSY Right   . BREAST LUMPECTOMY  01/22/2011   Procedure: LUMPECTOMY;  Surgeon: Haywood Lasso, MD;  Location: [facility];  Service: General;  Laterality: Left;  Needle localization  @ [facility]  12:30  . BREAST LUMPECTOMY W/ NEEDLE LOCALIZATION  01/22/2011   Left - Dr Margot Chimes  . COLONOSCOPY  2009  . EYE SURGERY Left    laser for glaucoma  . TONSILLECTOMY  1944  . TOTAL KNEE ARTHROPLASTY Right 07/09/2015   Procedure: RIGHT TOTAL KNEE ARTHROPLASTY;  Surgeon: Vickey Huger, MD;  Location: [facility];  Service: Orthopedics;  Laterality: Right;  [name] VAGINAL HYSTERECTOMY     partial    MEDICATIONS: Current Outpatient Medications on File Prior to Visit  Medication Sig Dispense Refill  . B Complex-C (SUPER B COMPLEX PO) Take 1 tablet by mouth daily.     [name] Bioflavonoid Products (GRAPE SEED PO) Take 1 tablet by mouth daily. Grape seed 50mg  & magnesium 15mg  OTC     . cholecalciferol (VITAMIN D) 1000 units tablet Take 1,000 Units by mouth daily.    [name] enoxaparin (LOVENOX) 40 MG/0.4ML injection Inject 0.4 mLs (40 mg total) into the skin daily. 13 Syringe 0  . Flaxseed, Linseed, ([employer] FLAXSEED OIL PO) Take 1,400 mg by mouth daily.    . Garlic 123XX123 MG CAPS Take 1,000 mg by mouth daily.      . metFORMIN (GLUCOPHAGE-XR) 500 MG 24 hr tablet Take 500 mg by mouth daily after supper.     . methocarbamol (ROBAXIN) 500 MG tablet Take 1-2 tablets (500-1,000 mg total) by mouth every 6 (six) hours as needed for muscle spasms. 60 tablet 0  . Multiple Vitamins-Minerals (MULTIVITAMIN WITH MINERALS) tablet Take 1 tablet by mouth daily.     [name] omega-3 acid  ethyl esters (LOVAZA) 1 G capsule Take 1 g by mouth daily.      . Omega-3 Fatty Acids (EQL OMEGA 3 FISH OIL) 1200 MG CAPS Take 1 capsule by mouth daily.    [name] omeprazole (PRILOSEC) 10 MG capsule Take 10 mg by mouth daily as needed.     . ondansetron (ZOFRAN) 4 MG tablet Take 1 tablet (4 mg total) by mouth every 6 (six) hours as needed for nausea. 20 tablet 0  . oxyCODONE (OXY IR/ROXICODONE) 5 MG immediate release tablet Take 1-2 tablets (5-10 mg total) by mouth every 3 (three) hours as needed for breakthrough pain. 60 tablet 0  . oxyCODONE (OXYCONTIN) 10 mg 12 hr tablet Take 1 tablet (10 mg total) by mouth every 12 (twelve) hours. 30 tablet  0  . simvastatin (ZOCOR) 20 MG tablet Take 20 mg by mouth daily after supper.     . travoprost, benzalkonium, (TRAVATAN) 0.004 % ophthalmic solution Place 1 drop into both eyes at bedtime. 5% solution per patient    . Turmeric Curcumin 500 MG CAPS Take 1 capsule by mouth daily. On hold    . vitamin C (ASCORBIC ACID) 500 MG tablet Take 1,000 mg by mouth daily.       No current facility-administered medications on file prior to visit.    ALLERGIES: Allergies  Allergen Reactions  . Penicillins Hives  . Shellfish Allergy Hives    Shrimp only  . Tape Rash    FAMILY HISTORY: Family History  Problem Relation Age of Onset  . Cancer Maternal Grandmother 72       breast  . Breast cancer Maternal Grandmother   . Cancer Paternal Aunt   . Cancer Paternal Uncle 4       colon    Observations/Objective:   Vitals:   02/23/19 1257  Weight: 190 lb (86.2 kg)  Height: 5\' 3"  (1.6 m)   GEN:  The patient appears stated age and is in NAD. Flat affect.  Neurological examination: Patient is awake, alert, oriented x 3. No aphasia or dysarthria. Intact fluency and comprehension. Remote and recent memory impaired.SLUMS score 17/30. Cranial nerves: Extraocular movements intact with no nystagmus. No facial asymmetry. Motor: moves all extremities symmetrically, at least  anti-gravity x 4. No incoordination on finger to nose testing. Gait: slow and cautious, no ataxia. Romberg test slight sway. Tremor: none visible. Good finger taps.  [employer] Mental Exam 02/24/2019  Weekday Correct 1  Current year 1  What state are we in? 1  Amount spent 1  Amount left 2  # of Animals 0  5 objects recall 1  Number series 2  Hour markers 0  Time correct 2  Placed X in triangle correctly 1  Largest Figure 1  Name of female 2  Date back to work 0  Type of work 2  State she lived in 0  Total score 17    Assessment and Plan:   This is an 85 year old ambidextrous right-hand dominant woman with a history of hypertension, hyperlipidemia, diabetes, breast cancer, presenting for evaluation of memory loss. Her neurological exam (although limited on video) is non-focal, SLUMS score 17/30. She has minimal insight into her condition and would visibly get irritated with her daughter. She has difficulties managing complex tasks, we discussed the diagnosis of dementia, likely Alzheimer's disease. MRI brain with and without contrast will be ordered to assess for underlying structural abnormality and assess vascular load. Bloodwork from PCP will be requested for review, if not done, TSH and B12 will be ordered. She is on Donepezil, we discussed adding on Memantine, this may help with behavioral changes as well, however we will likely add on an SSRI later on. Start Memantine 10mg  at bedtime x 2 weeks, then increase to 10mg  BID, side effects discussed. We discussed the need for additional help, she refuses having her daughter help her and agrees to have a Home Health nurse help with medication management. She does not drive. Continue 24/7 care. Follow-up in 6 months, they know to call for any changes.     Follow Up Instructions:   -I discussed the assessment and treatment plan with the patient. The patient was provided an opportunity to ask questions and all were answered. The  patient agreed with the plan  and demonstrated an understanding of the instructions.   The patient was advised to call back or seek an in-person evaluation if the symptoms worsen or if the condition fails to improve as anticipated.    Cameron Sprang, MD

## 2019-03-01 ENCOUNTER — Telehealth: Payer: Self-pay

## 2019-03-01 NOTE — Telephone Encounter (Signed)
Requested orders for medication management by a nurse 2 times a week for 2 weeks prn.  Signed order  And faxed back to [phone]. Paperwork sent to be scanned.

## 2019-03-02 ENCOUNTER — Other Ambulatory Visit: Payer: PRIVATE HEALTH INSURANCE

## 2019-03-07 ENCOUNTER — Other Ambulatory Visit (INDEPENDENT_AMBULATORY_CARE_PROVIDER_SITE_OTHER): Payer: Medicare Other

## 2019-03-07 ENCOUNTER — Telehealth: Payer: Self-pay | Admitting: Neurology

## 2019-03-07 ENCOUNTER — Telehealth: Payer: Self-pay

## 2019-03-07 ENCOUNTER — Other Ambulatory Visit: Payer: Self-pay

## 2019-03-07 DIAGNOSIS — F0391 Unspecified dementia with behavioral disturbance: Secondary | ICD-10-CM | POA: Diagnosis not present

## 2019-03-07 DIAGNOSIS — F03B18 Unspecified dementia, moderate, with other behavioral disturbance: Secondary | ICD-10-CM

## 2019-03-07 NOTE — Telephone Encounter (Signed)
[name] from [facility] called in to report a fall on the 24th due to the massive amount of clutter in her home- hoarding environment. Patient was experiencing left shoulder pain but assessment was done today and she has full range the moment/ pain is getting better. Just FYI. Thanks!

## 2019-03-07 NOTE — Telephone Encounter (Signed)
Daughter [name] informed. Ordered TSH and B12. She will try to bring pt today and have labs drawn. Daughter cancelled MRI on 03/23/18 and r/s to 12/23 and then pt cancelled the 23rd date. Number given to daughter to call Imaging and r/s scan.

## 2019-03-07 NOTE — Telephone Encounter (Signed)
-----   Message from Cameron Sprang, MD sent at 03/07/2019 10:43 AM EST ----- Regarding: bloodwork Pls let daughter know that I received bloodwork from PCP, TSH and B12 were not done recently, would like to do bloodwork when they can do it. Thanks

## 2019-03-08 LAB — TSH: TSH: 2.38 u[IU]/mL (ref 0.450–4.500)

## 2019-03-08 LAB — VITAMIN B12: Vitamin B-12: 433 pg/mL (ref 232–1245)

## 2019-03-09 ENCOUNTER — Telehealth: Payer: Self-pay

## 2019-03-09 NOTE — Telephone Encounter (Signed)
-----   Message from Cameron Sprang, MD sent at 03/08/2019 12:31 PM EST ----- Pls let daughter know bloodwork was normal, thanks

## 2019-03-09 NOTE — Telephone Encounter (Signed)
Daughter [name] informed of lab results.

## 2019-03-15 ENCOUNTER — Telehealth: Payer: Self-pay | Admitting: Neurology

## 2019-03-15 NOTE — Telephone Encounter (Signed)
Lytle Michaels from [facility] called in wanting to give any update on patient and some changes going on. He said he would rather relay info to nurse since it is a lot of info. Thanks!

## 2019-03-16 NOTE — Telephone Encounter (Signed)
Spoke with Lytle Michaels.  He has been to the pts home a couple of times and knocked on the door and no one would answer.   Lytle Michaels would like to extend therapy a few more weeks since this has happened. Verbal order given to do so. He states that he believes pts fall is not due to neurological impairments but do to the hoarding environment that the pt lives in. Per Lytle Michaels there are pathways through the home. He will call back with an update.

## 2019-03-16 NOTE — Telephone Encounter (Signed)
Noted, agree with extension. Is he helping with medications or is that a different nurse, is he is helping, how is she doing with it. Medication assistance will need to be longer-term. Thanks

## 2019-03-16 NOTE — Telephone Encounter (Signed)
He did say that he was helping with medications.

## 2019-03-18 ENCOUNTER — Other Ambulatory Visit: Payer: Self-pay | Admitting: Neurology

## 2019-03-20 ENCOUNTER — Other Ambulatory Visit: Payer: PRIVATE HEALTH INSURANCE

## 2019-03-24 ENCOUNTER — Other Ambulatory Visit: Payer: PRIVATE HEALTH INSURANCE

## 2019-03-27 ENCOUNTER — Other Ambulatory Visit: Payer: Self-pay

## 2019-03-27 ENCOUNTER — Ambulatory Visit
Admission: RE | Admit: 2019-03-27 | Discharge: 2019-03-27 | Disposition: A | Discharge status: Home / Self Care | Payer: PRIVATE HEALTH INSURANCE | Source: Ambulatory Visit | Attending: Neurology | Admitting: Neurology

## 2019-03-27 DIAGNOSIS — F03B18 Unspecified dementia, moderate, with other behavioral disturbance: Secondary | ICD-10-CM

## 2019-03-27 DIAGNOSIS — F0391 Unspecified dementia with behavioral disturbance: Secondary | ICD-10-CM

## 2019-03-27 MED ORDER — GADOBENATE DIMEGLUMINE 529 MG/ML IV SOLN
17.0000 mL | Freq: Once | INTRAVENOUS | Status: AC | PRN
  Administered 2019-03-27: 17 mL via INTRAVENOUS

## 2019-04-05 ENCOUNTER — Other Ambulatory Visit: Payer: PRIVATE HEALTH INSURANCE

## 2019-05-27 ENCOUNTER — Other Ambulatory Visit: Payer: Self-pay

## 2019-05-27 ENCOUNTER — Encounter: Payer: Self-pay | Admitting: Podiatry

## 2019-05-27 ENCOUNTER — Ambulatory Visit (INDEPENDENT_AMBULATORY_CARE_PROVIDER_SITE_OTHER): Payer: Medicare Other | Admitting: Podiatry

## 2019-05-27 VITALS — Temp 97.5°F

## 2019-05-27 DIAGNOSIS — M2012 Hallux valgus (acquired), left foot: Secondary | ICD-10-CM

## 2019-05-27 DIAGNOSIS — M79674 Pain in right toe(s): Secondary | ICD-10-CM

## 2019-05-27 DIAGNOSIS — M79675 Pain in left toe(s): Secondary | ICD-10-CM

## 2019-05-27 DIAGNOSIS — M2011 Hallux valgus (acquired), right foot: Secondary | ICD-10-CM

## 2019-05-27 DIAGNOSIS — B351 Tinea unguium: Secondary | ICD-10-CM | POA: Diagnosis not present

## 2019-05-27 DIAGNOSIS — E119 Type 2 diabetes mellitus without complications: Secondary | ICD-10-CM

## 2019-05-27 DIAGNOSIS — M2041 Other hammer toe(s) (acquired), right foot: Secondary | ICD-10-CM

## 2019-05-27 DIAGNOSIS — M2042 Other hammer toe(s) (acquired), left foot: Secondary | ICD-10-CM

## 2019-05-27 NOTE — Patient Instructions (Signed)
WEEKLY FOOT SOAK INSTRUCTIONS FOR FOOT HYGIENE   1. SOAK FEET IN LUKEWARM SOAPY WATER FOR 10 MINUTES. HAVE A FAMILY MEMBER OR CAREGIVER CHECK THE WATER TEMPERATURE FOR YOU BEFORE SUBMERGING YOUR FEET IN THE WATER.  2.  DRY FEET WELL TAKING CARE TO DRY WELL BETWEEN TOES AND UNDER TOES.  3.  APPLY MOISTURIZING CREAM TO FEET AVOIDING APPLICATION BETWEEN TOES  Moisturize feet once daily; do not apply between toes: Vaseline Intensive Care Lotion Lubriderm Lotion Gold Bond Diabetic Foot Lotion Eucerin Intensive Repair Moisturizing Lotion  If you have problems reaching your feet:  Aquaphor Advanced Therapy Ointment Body Spray Vaseline Intensive Care Spray Lotion Advanced Repair       Bunion  A bunion is a bump on the base of the big toe that forms when the bones of the big toe joint move out of position. Bunions may be small at first, but they often get larger over time. They can make walking painful. What are the causes? A bunion may be caused by:  Wearing narrow or pointed shoes that force the big toe to press against the other toes.  Abnormal foot development that causes the foot to roll inward (pronate).  Changes in the foot that are caused by certain diseases, such as rheumatoid arthritis or polio.  A foot injury. What increases the risk? The following factors may make you more likely to develop this condition:  Wearing shoes that squeeze the toes together.  Having certain diseases, such as: ? Rheumatoid arthritis. ? Polio. ? Cerebral palsy.  Having family members who have bunions.  Being born with a foot deformity, such as flat feet or low arches.  Doing activities that put a lot of pressure on the feet, such as ballet dancing. What are the signs or symptoms? The main symptom of a bunion is a noticeable bump on the big toe. Other symptoms may include:  Pain.  Swelling around the big toe.  Redness and inflammation.  Thick or hardened skin on the big toe or  between the toes.  Stiffness or loss of motion in the big toe.  Trouble with walking. How is this diagnosed? A bunion may be diagnosed based on your symptoms, medical history, and activities. You may have tests, such as:  X-rays. These allow your health care provider to check the position of the bones in your foot and look for damage to your joint. They also help your health care provider determine the severity of your bunion and the best way to treat it.  Joint aspiration. In this test, a sample of fluid is removed from the toe joint. This test may be done if you are in a lot of pain. It helps rule out diseases that cause painful swelling of the joints, such as arthritis. How is this treated? Treatment depends on the severity of your symptoms. The goal of treatment is to relieve symptoms and prevent the bunion from getting worse. Your health care provider may recommend:  Wearing shoes that have a wide toe box.  Using bunion pads to cushion the affected area.  Taping your toes together to keep them in a normal position.  Placing a device inside your shoe (orthotics) to help reduce pressure on your toe joint.  Taking medicine to ease pain, inflammation, and swelling.  Applying heat or ice to the affected area.  Doing stretching exercises.  Surgery to remove scar tissue and move the toes back into their normal position. This treatment is rare. Follow these instructions  at home: Managing pain, stiffness, and swelling   If directed, put ice on the painful area: ? Put ice in a plastic bag. ? Place a towel between your skin and the bag. ? Leave the ice on for 20 minutes, 2-3 times a day. Activity   If directed, apply heat to the affected area before you exercise. Use the heat source that your health care provider recommends, such as a moist heat pack or a heating pad. ? Place a towel between your skin and the heat source. ? Leave the heat on for 20-30 minutes. ? Remove the heat  if your skin turns bright red. This is especially important if you are unable to feel pain, heat, or cold. You may have a greater risk of getting burned.  Do exercises as told by your health care provider. General instructions  Support your toe joint with proper footwear, shoe padding, or taping as told by your health care provider.  Take over-the-counter and prescription medicines only as told by your health care provider.  Keep all follow-up visits as told by your health care provider. This is important. Contact a health care provider if your symptoms:  Get worse.  Do not improve in 2 weeks. Get help right away if you have:  Severe pain and trouble with walking. Summary  A bunion is a bump on the base of the big toe that forms when the bones of the big toe joint move out of position.  Bunions can make walking painful.  Treatment depends on the severity of your symptoms.  Support your toe joint with proper footwear, shoe padding, or taping as told by your health care provider. This information is not intended to replace advice given to you by your health care provider. Make sure you discuss any questions you have with your health care provider. Document Revised: 08/31/2017 Document Reviewed: 07/07/2017 Elsevier Patient Education  [place] Toe  Hammer toe is a change in the shape (a deformity) of your toe. The deformity causes the middle joint of your toe to stay bent. This causes pain, especially when you are wearing shoes. Hammer toe starts gradually. At first, the toe can be straightened. Gradually over time, the deformity becomes stiff and permanent. Early treatments to keep the toe straight may relieve pain. As the deformity becomes stiff and permanent, surgery may be needed to straighten the toe. What are the causes? Hammer toe is caused by abnormal bending of the toe joint that is closest to your foot. It happens gradually over time. This pulls on the  muscles and connections (tendons) of the toe joint, making them weak and stiff. It is often related to wearing shoes that are too short or narrow and do not let your toes straighten. What increases the risk? You may be at greater risk for hammer toe if you:  Are female.  Are older.  Wear shoes that are too small.  Wear high-heeled shoes that pinch your toes.  Are a Engineer, mining.  Have a second toe that is longer than your big toe (first toe).  Injure your foot or toe.  Have arthritis.  Have a family history of hammer toe.  Have a nerve or muscle disorder. What are the signs or symptoms? The main symptoms of this condition are pain and deformity of the toe. The pain is worse when wearing shoes, walking, or running. Other symptoms may include:  Corns or calluses over the bent part of the toe  or between the toes.  Redness and a burning feeling on the toe.  An open sore that forms on the top of the toe.  Not being able to straighten the toe. How is this diagnosed? This condition is diagnosed based on your symptoms and a physical exam. During the exam, your health care provider will try to straighten your toe to see how stiff the deformity is. You may also have tests, such as:  A blood test to check for rheumatoid arthritis.  An X-ray to show how severe the deformity is. How is this treated? Treatment for this condition will depend on how stiff the deformity is. Surgery is often needed. However, sometimes a hammer toe can be straightened without surgery. Treatments that do not involve surgery include:  Taping the toe into a straightened position.  Using pads and cushions to protect the toe (orthotics).  Wearing shoes that provide enough room for the toes.  Doing toe-stretching exercises at home.  Taking an NSAID to reduce pain and swelling. If these treatments do not help or the toe cannot be straightened, surgery is the next option. The most common surgeries used to  straighten a hammer toe include:  Arthroplasty. In this procedure, part of the joint is removed, and that allows the toe to straighten.  Fusion. In this procedure, cartilage between the two bones of the joint is taken out and the bones are fused together into one longer bone.  Implantation. In this procedure, part of the bone is removed and replaced with an implant to let the toe move again.  Flexor tendon transfer. In this procedure, the tendons that curl the toes down (flexor tendons) are repositioned. Follow these instructions at home:  Take over-the-counter and prescription medicines only as told by your health care provider.  Do toe straightening and stretching exercises as told by your health care provider.  Keep all follow-up visits as told by your health care provider. This is important. How is this prevented?  Wear shoes that give your toes enough room and do not cause pain.  Do not wear high-heeled shoes. Contact a health care provider if:  Your pain gets worse.  Your toe becomes red or swollen.  You develop an open sore on your toe. This information is not intended to replace advice given to you by your health care provider. Make sure you discuss any questions you have with your health care provider. Document Revised: 02/06/2017 Document Reviewed: 06/20/2015 Elsevier Patient Education  [place].  Onychomycosis/Fungal Toenails  WHAT IS IT? An infection that lies within the keratin of your nail plate that is caused by a fungus.  WHY ME? Fungal infections affect all ages, sexes, races, and creeds.  There may be many factors that predispose you to a fungal infection such as age, coexisting medical conditions such as diabetes, or an autoimmune disease; stress, medications, fatigue, genetics, etc.  Bottom line: fungus thrives in a warm, moist environment and your shoes offer such a location.  IS IT CONTAGIOUS? Theoretically, yes.  You do not want to share shoes,  nail clippers or files with someone who has fungal toenails.  Walking around barefoot in the same room or sleeping in the same bed is unlikely to transfer the organism.  It is important to realize, however, that fungus can spread easily from one nail to the next on the same foot.  HOW DO WE TREAT THIS?  There are several ways to treat this condition.  Treatment may depend on  many factors such as age, medications, pregnancy, liver and kidney conditions, etc.  It is best to ask your doctor which options are available to you.  5. No treatment.   Unlike many other medical concerns, you can live with this condition.  However for many people this can be a painful condition and may lead to ingrown toenails or a bacterial infection.  It is recommended that you keep the nails cut short to help reduce the amount of fungal nail. 6. Topical treatment.  These range from herbal remedies to prescription strength nail lacquers.  About 40-50% effective, topicals require twice daily application for approximately 9 to 12 months or until an entirely new nail has grown out.  The most effective topicals are medical grade medications available through physicians offices. 7. Oral antifungal medications.  With an 80-90% cure rate, the most common oral medication requires 3 to 4 months of therapy and stays in your system for a year as the new nail grows out.  Oral antifungal medications do require blood work to make sure it is a safe drug for you.  A liver function panel will be performed prior to starting the medication and after the first month of treatment.  It is important to have the blood work performed to avoid any harmful side effects.  In general, this medication safe but blood work is required. 8. Laser Therapy.  This treatment is performed by applying a specialized laser to the affected nail plate.  This therapy is noninvasive, fast, and non-painful.  It is not covered by insurance and is therefore, out of pocket.  The  results have been very good with a 80-95% cure rate.  [facility] is the only practice in the area to offer this therapy. 9. Permanent Nail Avulsion.  Removing the entire nail so that a new nail will not grow back.

## 2019-06-02 NOTE — Progress Notes (Signed)
Subjective: [name] presents today referred by Kelton Pillar, MD for diabetic foot evaluation.  She has h/o dementia and is accompanied by her daughter on today's visit.  Daughter states patient is prediabetic, but PCP documents NIDDM in chart (2018).   Patient denies any history of foot wounds.  Today, patient c/o of painful, discolored, thick toenails which interfere with daily activities.  Pain is aggravated when wearing enclosed shoe gear. She has seen community Podiatrist in the past for routine foot care.  Past Medical History:  Diagnosis Date  . Arthritis    knees  . Bladder atony    uses a large pad for incontinence   . 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 GERD (gastroesophageal reflux disease)    Uses PPI- only on occasion   . History of hiatal hernia   . Hypertension    for treatment, pt. unsure why this is noted in her hx.    . Normal cardiac stress test 2000   done at [facility]  . Thyroid nodule   . Thyroid nodule 2007   biopsy- benign    Patient Active Problem List   Diagnosis Date Noted  . S/P total knee replacement 07/09/2015  . Breast cancer of upper-outer quadrant of left female breast ([place]) 03/02/2013  . Bilateral wrist pain 10/29/2012    Past Surgical History:  Procedure Laterality Date  . ADENOIDECTOMY    . BREAST BIOPSY  1974   right  . BREAST BIOPSY  1977   left  . BREAST EXCISIONAL BIOPSY Right   . BREAST LUMPECTOMY  01/22/2011   Procedure: LUMPECTOMY;  Surgeon: Haywood Lasso, MD;  Location: [facility];  Service: General;  Laterality: Left;  Needle localization  @ [facility]  12:30  . BREAST LUMPECTOMY W/ NEEDLE LOCALIZATION  01/22/2011   Left - Dr Margot Chimes  . COLONOSCOPY  2009  . EYE SURGERY Left    laser for glaucoma  . TONSILLECTOMY  1944  . TOTAL KNEE ARTHROPLASTY Right 07/09/2015   Procedure: RIGHT TOTAL KNEE ARTHROPLASTY;  Surgeon: Vickey Huger, MD;  Location: [facility];  Service: Orthopedics;  Laterality: Right;  [name] VAGINAL HYSTERECTOMY     partial    Current Outpatient Medications on File Prior to Visit  Medication Sig Dispense Refill  . B Complex-C (SUPER B COMPLEX PO) Take 1 tablet by mouth daily.     [name] Bioflavonoid Products (GRAPE SEED PO) Take 1 tablet by mouth daily. Grape seed 50mg  & magnesium 15mg  OTC     . cholecalciferol (VITAMIN D) 1000 units tablet Take 1,000 Units by mouth daily.    [name] donepezil (ARICEPT) 10 MG tablet Take 10 mg by mouth at bedtime.    . Flaxseed, Linseed, ([employer] FLAXSEED OIL PO) Take 1,400 mg by mouth daily.    . Garlic 123XX123 MG CAPS Take 1,000 mg by mouth daily.      . memantine (NAMENDA) 10 MG tablet TAKE 1 TABLET BY MOUTH EVERY NIGHT FOR 2 WEEKS, THEN INCREASE TO 1 TABLET TWICE A DAY 180 tablet 4  . metFORMIN (GLUCOPHAGE-XR) 500 MG 24 hr tablet Take 500 mg by mouth daily after supper.     . methocarbamol (ROBAXIN) 500 MG tablet Take 1-2 tablets (500-1,000 mg total) by mouth every 6 (six) hours as needed for muscle spasms. 60 tablet 0  . Multiple Vitamins-Minerals (MULTIVITAMIN WITH MINERALS) tablet Take 1 tablet by mouth daily.     [name] omega-3 acid ethyl esters (LOVAZA) 1 G capsule Take 1 g by  mouth daily.      . Omega-3 Fatty Acids (EQL OMEGA 3 FISH OIL) 1200 MG CAPS Take 1 capsule by mouth daily.    [name] omeprazole (PRILOSEC) 10 MG capsule Take 10 mg by mouth daily as needed.     . simvastatin (ZOCOR) 20 MG tablet Take 20 mg by mouth daily after supper.     . travoprost, benzalkonium, (TRAVATAN) 0.004 % ophthalmic solution Place 1 drop into both eyes at bedtime. 5% solution per patient    . Turmeric Curcumin 500 MG CAPS Take 1 capsule by mouth daily. On hold    . vitamin C (ASCORBIC ACID) 500 MG tablet Take 1,000 mg by mouth daily.       No current facility-administered medications on file prior to visit.     Allergies  Allergen Reactions  . Penicillins Hives  . Shellfish Allergy Hives    Shrimp only  . Tape Rash    Social History   Occupational History  . Occupation: college professor     Comment: Retired    Fish farm manager: RETIRED  Tobacco Use  . Smoking status: Former Smoker    Quit date: 03/10/1981    Years since quitting: 38.2  . Smokeless tobacco: Never Used  Substance and Sexual Activity  . Alcohol use: No  . Drug use: No  . Sexual activity: Not Currently    Family History  Problem Relation Age of Onset  . Cancer Maternal Grandmother 53       breast  . Breast cancer Maternal Grandmother   . Cancer Paternal Aunt   . Cancer Paternal Uncle 26       colon    Immunization History  Administered Date(s) Administered  . Influenza-Unspecified 10/08/2013    Review of systems: Positive Findings in bold print.  Constitutional:  chills, fatigue, fever, sweats, weight change Communication: Optometrist, sign Ecologist, hand writing, iPad/Android device Head: headaches, head injury Eyes: changes in vision, eye pain, glaucoma, cataracts, macular degeneration, diplopia, glare,  light sensitivity, eyeglasses or contacts, blindness Ears nose mouth throat: hearing impaired, hearing aids,  ringing in ears, deaf, sign language,  vertigo, nosebleeds,  rhinitis,  cold sores, snoring, swollen glands Cardiovascular: HTN, edema, arrhythmia, pacemaker in place, defibrillator in place, chest pain/tightness, chronic anticoagulation, blood clot, heart failure, MI Peripheral Vascular: leg cramps, varicose veins, blood clots, lymphedema, varicosities Respiratory:  difficulty breathing, denies congestion, SOB, wheezing, cough, emphysema Gastrointestinal: change in appetite or weight, abdominal pain, constipation, diarrhea, nausea, vomiting, vomiting blood, change in bowel habits, abdominal pain, jaundice, rectal bleeding, hemorrhoids, GERD Genitourinary:  nocturia,  pain on urination, polyuria,  blood in urine, Foley catheter, urinary urgency, ESRD on hemodialysis Musculoskeletal: amputation, cramping, stiff joints, painful joints, decreased joint motion, fractures, OA, gout, hemiplegia,  paraplegia, uses cane, wheelchair bound, uses walker, uses rollator Skin: +changes in toenails, color change, dryness, itching, mole changes,  rash, wound(s) Neurological: headaches, numbness in feet, paresthesias in feet, burning in feet, fainting,  seizures, change in speech,  headaches, memory problems/poor historian, cerebral palsy, weakness, paralysis, CVA, TIA Endocrine: diabetes, hypothyroidism, hyperthyroidism,  goiter, dry mouth, flushing, heat intolerance,  cold intolerance,  excessive thirst, denies polyuria,  nocturia Hematological:  easy bleeding, excessive bleeding, easy bruising, enlarged lymph nodes, on long term blood thinner, history of past transusions Allergy/immunological:  hives, eczema, frequent infections, multiple drug allergies, seasonal allergies, transplant recipient, multiple food allergies Psychiatric:  anxiety, depression, mood disorder, suicidal ideations, hallucinations, insomnia  Objective: Vitals:   05/27/19 1626  Temp: (!) 97.5  F (36.4 C)    86 y.o. AA  female WD, WN IN NAD. AAO X 3.  Capillary fill time to digits <3 seconds b/l. Palpable DP pulses b/l. Palpable PT pulses b/l. Pedal hair absent b/l Skin temperature gradient within normal limits b/l.  Pedal skin with normal turgor, texture and tone bilaterally. No open wounds bilaterally. No interdigital macerations bilaterally. Toenails 1-5 b/l elongated, dystrophic, thickened, crumbly with subungual debris and tenderness to dorsal palpation.  Normal muscle strength 5/5 to all lower extremity muscle groups bilaterally, no pain crepitus or joint limitation noted with ROM b/l, bunion deformity noted b/l and hammertoes noted to the  2-5 bilaterally.  Protective sensation intact 5/5 intact bilaterally with 10g monofilament b/l Vibratory sensation intact b/l.  1. Pain due to onychomycosis of toenails of both feet   2. Hallux valgus, acquired, bilateral   3. Acquired hammertoes of both feet   4. Controlled  type 2 diabetes mellitus without complication, without long-term current use of insulin (HCC)     -Toenails 1-5 b/l were debrided in length and girth with sterile nail nippers and dremel without iatrogenic bleeding.  -Patient to continue soft, supportive shoe gear daily. -Patient to report any pedal injuries to medical professional immediately. -Dispensed written instructions for weekly foot soaks for pedal hygiene. -Patient/POA to call should there be question/concern in the interim.  Return in about 3 months (around 08/27/2019).

## 2019-07-06 ENCOUNTER — Telehealth: Payer: Self-pay | Admitting: Neurology

## 2019-07-06 NOTE — Telephone Encounter (Signed)
Patient's daughter called to report concerns the patient is not taking her medications. She said she just found two 90 day bottles of the recent prescription from Dr. Delice Lesch, she could not confirm the name of the medicine.  She further explained the patient has been hiding things from her and the family and will not let anyone help her with her medications.  Please call to advise?

## 2019-07-06 NOTE — Telephone Encounter (Signed)
This is part of her worsening dementia. Would she like an aide to come in daily so that family is not involved with the medications? Would family consider moving her to [place] where she has higher level of care? Thanks

## 2019-07-07 NOTE — Telephone Encounter (Signed)
Spoke to pt daughter they would like to go home health care, she said pt still knows what is going on Daughter had surgery 5 weeks ago, she stated that it is hard to get the pt to take a bath and change her sheets. Pt is going on a vacation soon to [place] she will be on a 6 hour fight, family thinks she will be ok. Also asking for advise to help with travel. Pt got upset when daughter found the pill bottles she had been hiding from them. Will send out a caregiver packet as well. Would like to use advance home care? Also asking if you can review medications to see if she is ok taken what you and dr Coral Spikes gave her. Could any of the medication make her mad and upset? ([name] is at time an angry pissed off person) per her daughter.

## 2019-07-08 ENCOUNTER — Other Ambulatory Visit: Payer: Self-pay

## 2019-07-08 DIAGNOSIS — F0391 Unspecified dementia with behavioral disturbance: Secondary | ICD-10-CM

## 2019-07-08 DIAGNOSIS — F03B18 Unspecified dementia, moderate, with other behavioral disturbance: Secondary | ICD-10-CM

## 2019-07-08 NOTE — Telephone Encounter (Signed)
Spoke with pt daughter Madaline Brilliant for [facility], she needs a daily caregiver to come in and help. None of her medications that we have on file would cause mood changes, but Dr. Laurann Montana is not on our system, so I am not sure if he started a new medication recently. The mood and personality changes are part of worsening dementia. Advise for travel would be to have someone with her to help with confusion with moving around and getting out of routine. Order placed for home health with help of aide, RN and OT for medication aide, personal care

## 2019-07-08 NOTE — Telephone Encounter (Signed)
[place] for [facility], she needs a daily caregiver to come in and help. None of her medications that we have on file would cause mood changes, but Dr. Laurann Montana is not on our system, so I am not sure if he started a new medication recently. The mood and personality changes are part of worsening dementia. Advise for travel would be to have someone with her to help with confusion with moving around and getting out of routine.

## 2019-07-11 ENCOUNTER — Other Ambulatory Visit: Payer: Self-pay

## 2019-07-11 DIAGNOSIS — F03B18 Unspecified dementia, moderate, with other behavioral disturbance: Secondary | ICD-10-CM

## 2019-07-11 DIAGNOSIS — F0391 Unspecified dementia with behavioral disturbance: Secondary | ICD-10-CM

## 2019-08-01 ENCOUNTER — Telehealth: Payer: Self-pay | Admitting: Neurology

## 2019-08-01 NOTE — Telephone Encounter (Signed)
The following message was left with [facility] on 08/01/19 at 12:58 PM.  Caller's mother was supposed to be set up for at home nursing visits again. Caller would like to know if her mother can receive more in home nursing services. Caller reports she has not heard back from the office on this.

## 2019-08-02 ENCOUNTER — Telehealth: Payer: Self-pay | Admitting: Neurology

## 2019-08-02 NOTE — Telephone Encounter (Signed)
Patients daughter was returning [employer] phone call. Please call.

## 2019-08-02 NOTE — Telephone Encounter (Signed)
Message left for pt daughter to call the office back

## 2019-08-02 NOTE — Telephone Encounter (Signed)
I have an opening on Thurs 5/27 at 10am, we can either do in-person or virtual, whichever is easiest. Pls let daughter know this visit is needed to set up nursing visits. Thanks.

## 2019-08-02 NOTE — Telephone Encounter (Signed)
Spoke to pt daughter will do VV on 5/27 at 10am, they where informed that this visit was to get home health started, pt daughter thankful for the help.

## 2019-08-04 ENCOUNTER — Other Ambulatory Visit: Payer: Self-pay

## 2019-08-04 ENCOUNTER — Telehealth (INDEPENDENT_AMBULATORY_CARE_PROVIDER_SITE_OTHER): Payer: Medicare Other | Admitting: Neurology

## 2019-08-04 ENCOUNTER — Encounter: Payer: Self-pay | Admitting: Neurology

## 2019-08-04 ENCOUNTER — Telehealth: Payer: Self-pay | Admitting: Neurology

## 2019-08-04 VITALS — Ht 63.0 in | Wt 200.0 lb

## 2019-08-04 DIAGNOSIS — F03B18 Unspecified dementia, moderate, with other behavioral disturbance: Secondary | ICD-10-CM

## 2019-08-04 DIAGNOSIS — F0391 Unspecified dementia with behavioral disturbance: Secondary | ICD-10-CM

## 2019-08-04 MED ORDER — MEMANTINE HCL 10 MG PO TABS: ORAL_TABLET | ORAL | 11 refills | Status: DC

## 2019-08-04 MED ORDER — ESCITALOPRAM OXALATE 10 MG PO TABS: 10.0000 mg | ORAL_TABLET | Freq: Every day | ORAL | 11 refills | Status: DC

## 2019-08-04 NOTE — Progress Notes (Signed)
Virtual Visit via Video Note The purpose of this virtual visit is to provide medical care while limiting exposure to the novel coronavirus.    Consent was obtained for video visit:  Yes.   Answered questions that patient had about telehealth interaction:  Yes.   I discussed the limitations, risks, security and privacy concerns of performing an evaluation and management service by telemedicine. I also discussed with the patient that there may be a patient responsible charge related to this service. The patient expressed understanding and agreed to proceed.  Pt location: Home Physician Location: office Name of referring provider:  Kelton Pillar, MD I connected with [name] at patients initiation/request on 08/04/2019 at 10:00 AM EDT by video enabled telemedicine application and verified that I am speaking with the correct person using two identifiers. Pt MRN:  [id number] Pt DOB:  October 25, 1931 Video Participants:  [name];  [name] (daughter)   History of Present Illness:  The patient had a virtual video visit on 08/04/2019. She was last seen in the neurology clinic 5 months ago for dementia. She is again accompanied by her daughter [name] who helps supplement the history today. SLUMS score in 02/2019 was 17/30. I personally reviewed MRI brain without contrast done 03/2019 which did not show any acute changes, there was diffuse atrophy and mild to moderate chronic microvascular disease. On her last visit, Memantine was added on to Donepezil. She feels her memory is good. Her daughter had called our office to report that she was not taking her medications, they found 2 full 90-day bottles of Memantine. She states she takes Memantine 10mg  only at bedtime. She states she takes her medications, she has a bag with her medications and would not let family touch it. She got upset when her daughter found the bottles she had been hiding from them. They gave her a pillbox which she does not use. Her  daughter also called to report that it is difficult to get her to shower and change her sheets. [name] reports she has not showered in 3 years, she does sponge baths daily. She needs assistance with dressing and bathing. She had a fall due to massive clutter in her home (hoarding behavior). Her daughter wondered if any of her medications was making her mad and upset, saying she is an "angry and pissed off person." She is calm and pleasant during today's visit. I spoke to her daughter separately after the visit and she reports that the patient was getting a little irritated and [name] could not speak freely in front of her. [name] reports they cleared up the clutter on her floor after the fall, there is still a lot of clutter but the floor is clear. [name] manages finances and meals. She does not drive. She plays cards once a week, friends say she makes a few mistakes but overall does pretty good. Sleep is good, no hallucinations. She denies any headaches, dizziness, vision changes, focal numbness/tingling/weakness.  History on Initial Assessment 02/24/2019: This is an 86 year old ambidextrous right-hand dominant woman with a history of hypertension, hyperlipidemia, diabetes, breast cancer, presenting for evaluation of memory loss. She states her memory is "pretty well."  She states she usually remembers to take her medications. Her daughter [name] is present during the e-visit and provides a different story. She has been living with her family since 18. Family started noticing memory changes at least 5 years ago, worse in the past year. She would repeat herself, lose things  frequently. In the Fall of last year, she would not come home until very late at night, they were now sure if she knew where she was. She started having difficulties managing finances. She had always been early paying her bills, however they found out her credit score went from 800 to 644 because she was forgetting to pay. [name] found out in  March that bills were 2-3 months behind. For the past 2 years, she has had paranoia, accusing everybody of being a thief. She is convinced she saw her grandson reach over her one night to take money, which never happened. She states this is true and she asked him what he was there for at 4am last week. She states her grandson is cursing every time he comes in. She has not showered in 2.5 years and has not washed her hair this year. She only takes sponge baths. Her shower stall is full of things, their house looks like a hoarder house. They found out that her license expired in January 2020 but she continued to drive, so they took away her keys. She has not driven since the end of February. She denies getting lost, however [name] reports she called at least once last Fall asking how to get home from their local [place]. She denies burning food on the stove, [name] reminds her she has not cooked in a few weeks because she had burned something. She is irritated every single day. She states she feels fine until people ask me thing that are "not that." She was started on Donepezil 10mg  daily by her PCP. Several paternal aunts had Alzheimer's disease. Another relative (maternal grandmother's first cousin) had early onset dementia. No history of significant head injuries or alcohol use.   She has left shoulder pain. She has occasional numbness in her feet that resolves when standing. She fell a month ago when she tripped and was on the floor all night. It appears she had incontinence while on the floor, she does not remember this. She denies any headaches, dizziness, diplopia, dysarthria/dysphagia, neck/back pain, focal numbness/tingling/weakness, bowel/bladder dysfunction, anosmia, or tremors.   Outpatient Encounter Medications as of 08/04/2019  Medication Sig Note  . B Complex-C (SUPER B COMPLEX PO) Take 1 tablet by mouth daily.    [name] Bioflavonoid Products (GRAPE SEED PO) Take 1 tablet by mouth daily. Grape seed  50mg  & magnesium 15mg  OTC    . cholecalciferol (VITAMIN D) 1000 units tablet Take 1,000 Units by mouth daily. 06/26/2015: On Hold  . donepezil (ARICEPT) 10 MG tablet Take 10 mg by mouth at bedtime.   . Flaxseed, Linseed, ([employer]S FLAXSEED OIL PO) Take 1,400 mg by mouth daily.   . Garlic 123XX123 MG CAPS Take 1,000 mg by mouth daily.   06/26/2015: On Hold  . memantine (NAMENDA) 10 MG tablet Take 1 tablet twice a day (patient taking 1 tablet every night)   . metFORMIN (GLUCOPHAGE-XR) 500 MG 24 hr tablet Take 500 mg by mouth daily after supper.    . methocarbamol (ROBAXIN) 500 MG tablet Take 1-2 tablets (500-1,000 mg total) by mouth every 6 (six) hours as needed for muscle spasms.   . Multiple Vitamins-Minerals (MULTIVITAMIN WITH MINERALS) tablet Take 1 tablet by mouth daily.    [name] omega-3 acid ethyl esters (LOVAZA) 1 G capsule Take 1 g by mouth daily.     . Omega-3 Fatty Acids (EQL OMEGA 3 FISH OIL) 1200 MG CAPS Take 1 capsule by mouth daily.   [name] omeprazole (PRILOSEC) 10  MG capsule Take 10 mg by mouth daily as needed.    . simvastatin (ZOCOR) 20 MG tablet Take 20 mg by mouth daily after supper.    . travoprost, benzalkonium, (TRAVATAN) 0.004 % ophthalmic solution Place 1 drop into both eyes at bedtime. 5% solution per patient   . Turmeric Curcumin 500 MG CAPS Take 1 capsule by mouth daily. On hold   . vitamin C (ASCORBIC ACID) 500 MG tablet Take 1,000 mg by mouth daily.     .     .      No facility-administered encounter medications on file as of 08/04/2019.    Observations/Objective:   Vitals:   08/04/19 0808  Weight: 200 lb (90.7 kg)  Height: 5\' 3"  (1.6 m)   GEN:  The patient appears stated age and is in NAD.  Neurological examination: Patient is awake, alert, oriented to person, place, month/year/day of week. No aphasia or dysarthria. Intact fluency and comprehension. Remote and recent memory impaired, 0/3 delayed recall. Able to name and repeat. Clock drawing 4/5 (wrong hands). Cranial nerves:  Extraocular movements intact with no nystagmus. No facial asymmetry. Motor: moves all extremities symmetrically, at least anti-gravity x 4. No incoordination on finger to nose testing. Gait: slow and cautious, no ataxia   Assessment and Plan:   This is an 86 yo ambidextrous right-hand dominant woman with a history of hypertension, hyperlipidemia, diabetes, breast cancer, with dementia, likely Alzheimer's disease. SLUMS 17/30 in 02/2019. MRI brain showed diffuse atrophy, mild to moderate chronic microvascular disease. She is on Donepezil 10mg  qhs and Memantine, but has been taking Memantine qhs, instructed to increase to 10mg  BID. Family reports she has not been taking medications as prescribed, there is some paranoia, she does not family to touch her medications. We had previously ordered home health to help with medication management, at this point would recommend a home aide to come daily to help with medication administration. She may benefit from speech therapy for cognitive therapy. She is having more behavioral changes and would benefit from an SSRI, discussed starting Lexapro 10mg  daily, side effects discussed. Continue close supervision, she does not drive. Follow-up in 4-5 months, they know to call for any changes.    Follow Up Instructions:   -I discussed the assessment and treatment plan with the patient/daughter. The patient/daughter were provided an opportunity to ask questions and all were answered. The patient/daughter agreed with the plan and demonstrated an understanding of the instructions.   The patient/daughter were advised to call back or seek an in-person evaluation if the symptoms worsen or if the condition fails to improve as anticipated.    Cameron Sprang, MD

## 2019-08-04 NOTE — Telephone Encounter (Signed)
Spoke to daughter.

## 2019-08-04 NOTE — Telephone Encounter (Signed)
Patient daughter would like to speak to Dr Delice Lesch. She has some questions that she was unable to ask while on the Virtual Visit. She also wanted to let Dr Delice Lesch know that she is very thankful for her being so sweet to patient please call

## 2019-08-25 ENCOUNTER — Encounter: Payer: Self-pay | Admitting: Neurology

## 2019-08-25 ENCOUNTER — Telehealth: Payer: Self-pay | Admitting: Neurology

## 2019-08-25 NOTE — Telephone Encounter (Signed)
Pt daughter called no answer voice mail left informing daughter that letter was at the front desk with other information for her to pick up

## 2019-08-25 NOTE — Telephone Encounter (Signed)
Patient's daughter called in wanting to find out about getting someone to help take care of the patient in her home. She also was expecting a letter from Dr. Delice Lesch that they spoke about in her last appointment about the patient not being able to make sound financial decisions. The patient's daughter can come by the office and pick the letter up.

## 2019-08-25 NOTE — Telephone Encounter (Signed)
Letter ready for pickup. Can you pls provide her with information about home caregivers, also the flyer for [employer] to help provide more resources. Thanks

## 2019-08-26 ENCOUNTER — Other Ambulatory Visit: Payer: Self-pay

## 2019-08-26 ENCOUNTER — Encounter: Payer: Self-pay | Admitting: Podiatry

## 2019-08-26 ENCOUNTER — Ambulatory Visit (INDEPENDENT_AMBULATORY_CARE_PROVIDER_SITE_OTHER): Payer: Medicare Other | Admitting: Podiatry

## 2019-08-26 DIAGNOSIS — M79674 Pain in right toe(s): Secondary | ICD-10-CM

## 2019-08-26 DIAGNOSIS — B351 Tinea unguium: Secondary | ICD-10-CM

## 2019-08-26 DIAGNOSIS — M2012 Hallux valgus (acquired), left foot: Secondary | ICD-10-CM

## 2019-08-26 DIAGNOSIS — M2041 Other hammer toe(s) (acquired), right foot: Secondary | ICD-10-CM

## 2019-08-26 DIAGNOSIS — M2042 Other hammer toe(s) (acquired), left foot: Secondary | ICD-10-CM

## 2019-08-26 DIAGNOSIS — M79675 Pain in left toe(s): Secondary | ICD-10-CM | POA: Diagnosis not present

## 2019-08-26 DIAGNOSIS — M2011 Hallux valgus (acquired), right foot: Secondary | ICD-10-CM

## 2019-08-26 DIAGNOSIS — E119 Type 2 diabetes mellitus without complications: Secondary | ICD-10-CM | POA: Diagnosis not present

## 2019-09-03 NOTE — Progress Notes (Signed)
Subjective: [name] presents today preventative diabetic foot care and painful mycotic nails b/l that are difficult to trim. Pain interferes with ambulation. Aggravating factors include wearing enclosed shoe gear. Pain is relieved with periodic professional debridement.  Kelton Pillar, MD is patient's PCP.   Her daughter is present during today's visit.  Past Medical History:  Diagnosis Date  . Arthritis    knees  . Bladder atony    uses a large pad for incontinence   . 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 GERD (gastroesophageal reflux disease)    Uses PPI- only on occasion   . History of hiatal hernia   . Hypertension    for treatment, pt. unsure why this is noted in her hx.    . Normal cardiac stress test 2000   done at [facility]  . Thyroid nodule   . Thyroid nodule 2007   biopsy- benign     Patient Active Problem List   Diagnosis Date Noted  . S/P total knee replacement 07/09/2015  . Breast cancer of upper-outer quadrant of left female breast ([place]) 03/02/2013  . Bilateral wrist pain 10/29/2012    Current Outpatient Medications on File Prior to Visit  Medication Sig Dispense Refill  . B Complex-C (SUPER B COMPLEX PO) Take 1 tablet by mouth daily.     [name] Bioflavonoid Products (GRAPE SEED PO) Take 1 tablet by mouth daily. Grape seed 50mg  & magnesium 15mg  OTC     . cholecalciferol (VITAMIN D) 1000 units tablet Take 1,000 Units by mouth daily.    [name] donepezil (ARICEPT) 10 MG tablet Take 10 mg by mouth at bedtime.    [name] escitalopram (LEXAPRO) 10 MG tablet Take 1 tablet (10 mg total) by mouth at bedtime. 30 tablet 11  . Flaxseed, Linseed, (CVS FLAXSEED OIL PO) Take 1,400 mg by mouth daily.    . Garlic 1610 MG CAPS Take 1,000 mg by mouth daily.      . memantine (NAMENDA) 10 MG tablet Take 1 tablet twice a day 60 tablet 11  . metFORMIN (GLUCOPHAGE-XR) 500 MG 24 hr tablet Take 500 mg by mouth daily after supper.     . methocarbamol (ROBAXIN) 500 MG tablet Take 1-2 tablets  (500-1,000 mg total) by mouth every 6 (six) hours as needed for muscle spasms. 60 tablet 0  . Multiple Vitamins-Minerals (MULTIVITAMIN WITH MINERALS) tablet Take 1 tablet by mouth daily.     [name] omega-3 acid ethyl esters (LOVAZA) 1 G capsule Take 1 g by mouth daily.      . Omega-3 Fatty Acids (EQL OMEGA 3 FISH OIL) 1200 MG CAPS Take 1 capsule by mouth daily.    [name] omeprazole (PRILOSEC) 10 MG capsule Take 10 mg by mouth daily as needed.     . simvastatin (ZOCOR) 20 MG tablet Take 20 mg by mouth daily after supper.     . TRAVATAN Z 0.004 % SOLN ophthalmic solution INSTILL 1 DROP INTO BOTH EYES DAILY AT BEDTIME    . Turmeric Curcumin 500 MG CAPS Take 1 capsule by mouth daily. On hold    . vitamin C (ASCORBIC ACID) 500 MG tablet Take 1,000 mg by mouth daily.       No current facility-administered medications on file prior to visit.     Allergies  Allergen Reactions  . Penicillins Hives  . Shellfish Allergy Hives    Shrimp only  . Tape Rash    Objective: [name] is a pleasant 86 y.o. African American  female WD, WN in NAD. AAO x 3.  There were no vitals filed for this visit.  Vascular Examination: Neurovascular status unchanged b/l lower extremities. Capillary fill time to digits <3 seconds b/l lower extremities. Palpable pedal pulses b/l LE. Pedal hair absent. Lower extremity skin temperature gradient within normal limits.  Dermatological Examination: Pedal skin with normal turgor, texture and tone bilaterally. No open wounds bilaterally. No interdigital macerations bilaterally. Toenails 1-5 b/l elongated, discolored, dystrophic, thickened, crumbly with subungual debris and tenderness to dorsal palpation.  Musculoskeletal: Normal muscle strength 5/5 to all lower extremity muscle groups bilaterally. No pain crepitus or joint limitation noted with ROM b/l. Hallux valgus with bunion deformity noted b/l lower extremities. Hammertoes noted to the 2-5 bilaterally.  Neurological  Examination: Protective sensation intact 5/5 intact bilaterally with 10g monofilament b/l. Vibratory sensation intact b/l. Proprioception intact bilaterally.  Last A1c: No flowsheet data found.   Assessment: 1. Pain due to onychomycosis of toenails of both feet   2. Hallux valgus, acquired, bilateral   3. Acquired hammertoes of both feet   4. Controlled type 2 diabetes mellitus without complication, without long-term current use of insulin ([place])   Plan: -Examined patient. -No new findings. No new orders. -Continue diabetic foot care principles. -Toenails 1-5 b/l were debrided in length and girth with sterile nail nippers and dremel without iatrogenic bleeding.  -Patient to report any pedal injuries to medical professional immediately. -Patient to continue soft, supportive shoe gear daily. -Patient/POA to call should there be question/concern in the interim.  Return in about 3 months (around 11/26/2019).  Marzetta Board, DPM

## 2019-09-07 ENCOUNTER — Telehealth: Payer: Self-pay | Admitting: Neurology

## 2019-09-07 NOTE — Telephone Encounter (Signed)
Daughter called, concerned with pts dramatic change in behavior since starting to take memantine as ordered. States she is very subdued, seems more confused re things she was still oriented about even when "angry and not at all herself". Wanting to know if should cont pt on same dose until her appointment 10/10/2019. Told her I would consult with Dr Delice Lesch and update with any changes.

## 2019-09-07 NOTE — Telephone Encounter (Signed)
Patient's daughter called and left a message stating, "We've been closely monitoring mom's medication and we have a questions for the nurse about one of them."

## 2019-09-08 ENCOUNTER — Telehealth: Payer: Self-pay

## 2019-09-08 ENCOUNTER — Telehealth: Payer: Self-pay | Admitting: *Deleted

## 2019-09-08 ENCOUNTER — Telehealth: Payer: Self-pay | Admitting: Podiatry

## 2019-09-08 NOTE — Telephone Encounter (Signed)
Pt daughter has called stating that her lft side of foot is swollen and looks like she has walnuts wanted to know what she can do please assist

## 2019-09-08 NOTE — Telephone Encounter (Signed)
I spoke with [name] and she states the area is between the little toe and the heel, but it is huge with a rub area. I instructed [name] to place a little antibiotic ointment like neosporin on the area and cover with a light bandaid, it would serve to pad off the area and possible soothe and I would have scheduler call tomorrow to get her in, she should also start to call about 8:00am.

## 2019-09-08 NOTE — Telephone Encounter (Signed)
Pt's dtr, [name] states pt has a huge lump like 2 walnuts on her foot.

## 2019-09-08 NOTE — Telephone Encounter (Signed)
We had talked about increasing Memantine 10mg  to BID dosing. Pls have her go back to 10mg  qhs. Thanks

## 2019-09-08 NOTE — Telephone Encounter (Signed)
Called pts daughter and inst her to decrease pts memantine to 10mg  at HS and discontinue AM dose, she verbalized understanding and will call back if further concerns.

## 2019-09-09 ENCOUNTER — Other Ambulatory Visit: Payer: Self-pay

## 2019-09-09 ENCOUNTER — Ambulatory Visit (INDEPENDENT_AMBULATORY_CARE_PROVIDER_SITE_OTHER): Payer: Medicare Other

## 2019-09-09 ENCOUNTER — Ambulatory Visit (INDEPENDENT_AMBULATORY_CARE_PROVIDER_SITE_OTHER): Payer: Medicare Other | Admitting: Podiatry

## 2019-09-09 DIAGNOSIS — M7672 Peroneal tendinitis, left leg: Secondary | ICD-10-CM

## 2019-09-09 DIAGNOSIS — M2011 Hallux valgus (acquired), right foot: Secondary | ICD-10-CM

## 2019-09-09 DIAGNOSIS — M2012 Hallux valgus (acquired), left foot: Secondary | ICD-10-CM

## 2019-09-09 NOTE — Telephone Encounter (Signed)
Called pt dtr to get pt in today lvm

## 2019-09-13 ENCOUNTER — Telehealth: Payer: Self-pay | Admitting: Neurology

## 2019-09-13 ENCOUNTER — Encounter: Payer: Self-pay | Admitting: Podiatry

## 2019-09-13 DIAGNOSIS — F03B18 Unspecified dementia, moderate, with other behavioral disturbance: Secondary | ICD-10-CM

## 2019-09-13 NOTE — Telephone Encounter (Signed)
I'm not quite sure this is all related to medication. When patients with dementia have these sudden changes, would also make sure there is no infection causing these changes. Would do bloodwork and urinalysis. Pls order CBC, CMP, and urinalysis. She can try stopping the Donepezil and Memantine also. Thanks

## 2019-09-13 NOTE — Progress Notes (Signed)
Subjective:  Patient ID: [name], female    DOB: Jul 15, 1931,  MRN: [id number]  Chief Complaint  Patient presents with  . Foot Pain    pt is here for foot pain    86 y.o. female presents with the above complaint.  Patient presents with the pain to the left peroneal tendon on the lateral side of the foot.  Patient states that was going on for 2 weeks was painful to walk on.  Patient states however the pain has dissipated during this clinical visit.  She does not have the same pain anymore.  She is not able to recreate the pain during this clinical visit.  She would like to know if there is no fractures on the x-ray.  She just wants to make sure everything was okay.  She denies any other acute complaints.  She generally presents to Dr. Adah Perl for routine foot care.  She denies any other acute complaints.   Review of Systems: Negative except as noted in the HPI. Denies N/V/F/Ch.  Past Medical History:  Diagnosis Date  . Arthritis    knees  . Bladder atony    uses a large pad for incontinence   . 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 GERD (gastroesophageal reflux disease)    Uses PPI- only on occasion   . History of hiatal hernia   . Hypertension    for treatment, pt. unsure why this is noted in her hx.    . Normal cardiac stress test 2000   done at [facility]  . Thyroid nodule   . Thyroid nodule 2007   biopsy- benign    Current Outpatient Medications:  .  B Complex-C (SUPER B COMPLEX PO), Take 1 tablet by mouth daily. , Disp: , Rfl:  .  Bioflavonoid Products (GRAPE SEED PO), Take 1 tablet by mouth daily. Grape seed 50mg  & magnesium 15mg  OTC , Disp: , Rfl:  .  cholecalciferol (VITAMIN D) 1000 units tablet, Take 1,000 Units by mouth daily., Disp: , Rfl:  .  donepezil (ARICEPT) 10 MG tablet, Take 10 mg by mouth at bedtime., Disp: , Rfl:  .  escitalopram (LEXAPRO) 10 MG tablet, Take 1 tablet (10 mg total) by mouth at bedtime., Disp: 30 tablet, Rfl: 11 .  Flaxseed, Linseed, ([employer]S  FLAXSEED OIL PO), Take 1,400 mg by mouth daily., Disp: , Rfl:  .  Garlic 8416 MG CAPS, Take 1,000 mg by mouth daily.  , Disp: , Rfl:  .  memantine (NAMENDA) 10 MG tablet, Take 1 tablet twice a day, Disp: 60 tablet, Rfl: 11 .  metFORMIN (GLUCOPHAGE-XR) 500 MG 24 hr tablet, Take 500 mg by mouth daily after supper. , Disp: , Rfl:  .  methocarbamol (ROBAXIN) 500 MG tablet, Take 1-2 tablets (500-1,000 mg total) by mouth every 6 (six) hours as needed for muscle spasms., Disp: 60 tablet, Rfl: 0 .  Multiple Vitamins-Minerals (MULTIVITAMIN WITH MINERALS) tablet, Take 1 tablet by mouth daily. , Disp: , Rfl:  .  omega-3 acid ethyl esters (LOVAZA) 1 G capsule, Take 1 g by mouth daily.  , Disp: , Rfl:  .  Omega-3 Fatty Acids (EQL OMEGA 3 FISH OIL) 1200 MG CAPS, Take 1 capsule by mouth daily., Disp: , Rfl:  .  omeprazole (PRILOSEC) 10 MG capsule, Take 10 mg by mouth daily as needed. , Disp: , Rfl:  .  simvastatin (ZOCOR) 20 MG tablet, Take 20 mg by mouth daily after supper. , Disp: , Rfl:  .  TRAVATAN Z 0.004 % SOLN ophthalmic solution, INSTILL 1 DROP INTO BOTH EYES DAILY AT BEDTIME, Disp: , Rfl:  .  Turmeric Curcumin 500 MG CAPS, Take 1 capsule by mouth daily. On hold, Disp: , Rfl:  .  vitamin C (ASCORBIC ACID) 500 MG tablet, Take 1,000 mg by mouth daily.  , Disp: , Rfl:   Social History   Tobacco Use  Smoking Status Former Smoker  . Quit date: 03/10/1981  . Years since quitting: 38.5  Smokeless Tobacco Never Used    Allergies  Allergen Reactions  . Penicillins Hives  . Shellfish Allergy Hives    Shrimp only  . Tape Rash   Objective:  There were no vitals filed for this visit. There is no height or weight on file to calculate BMI. Constitutional Well developed. Well nourished.  Vascular Dorsalis pedis pulses palpable bilaterally. Posterior tibial pulses palpable bilaterally. Capillary refill normal to all digits.  No cyanosis or clubbing noted. Pedal hair growth normal.  Neurologic Normal  speech. Oriented to person, place, and time. Epicritic sensation to light touch grossly present bilaterally.  Dermatologic Nails well groomed and normal in appearance. No open wounds. No skin lesions.  Orthopedic:  No pain on palpation along the course of the peroneal tendon.  No pain with plantarflexion inversion of the foot.  No pain with dorsiflexion and eversion resisted on the left foot.  Unable to recreate the pain clinically and on palpation stating.  No pain along the course of Achilles tendon, peroneal tendon, posterior tibial tendon, ATFL   Radiographs: 3 views of skeletally mature adult left foot: No osseous abnormalities noted.  No fractures noted.  No other bony abnormalities identified Assessment:   1. Peroneal tendinitis of left lower extremity    Plan:  Patient was evaluated and treated and all questions answered.  Left peroneal tendinitis now resolved -I explained to the patient the etiology of peroneal tendinitis and its impact with gait mechanics.  I encouraged patient to wear good shoes and make shoe gear modification.  Patient states understanding.  Given that this is clinically resolved I do not plan on doing any kind of interventions.  I have asked the patient if this recurs come back and see me right away and I can either place her in a brace or a boot.  Patient states understanding  No follow-ups on file.

## 2019-09-13 NOTE — Telephone Encounter (Signed)
AccessNurse 09/13/19 @ 12:06PM:  "Caller states she needs a nurse to call her in regards to her mother. She states her mother seems confused. She noticed she had not been taking her medication because her behavior has changed."

## 2019-09-13 NOTE — Telephone Encounter (Signed)
Spoke with pts daughter who states the last few days pt has stayed in bed until late PM, completely out of character for her. Looking into her medications, she thinks pt hasn't been taking her medications for awhile and wondering if starting her back on donepezil and memantine might be making her want to stay in bed all of the time.She did cut back on the memantine to daily at HS per recommendation on 09/08/2019 but wanting to know if she should cut back on either medication more to see if pt will be more likely to get up OOB because she isn't eating much or engaging in ADLs.

## 2019-09-13 NOTE — Telephone Encounter (Signed)
Notified pts daughter that she could try stopping one or both medications to see if drowsiness subsides per Dr Delice Lesch. Also requested she bring her mother in for labs in the next few days to r/o infection or other source of increased confusion, she verbalized understanding.

## 2019-09-13 NOTE — Addendum Note (Signed)
Addended by: Ladell Pier A on: 09/13/2019 03:37 PM   Modules accepted: Orders

## 2019-09-15 ENCOUNTER — Other Ambulatory Visit: Payer: Self-pay | Admitting: Podiatry

## 2019-09-15 DIAGNOSIS — M7672 Peroneal tendinitis, left leg: Secondary | ICD-10-CM

## 2019-09-20 ENCOUNTER — Other Ambulatory Visit: Payer: Self-pay

## 2019-09-20 ENCOUNTER — Other Ambulatory Visit (INDEPENDENT_AMBULATORY_CARE_PROVIDER_SITE_OTHER): Payer: Medicare Other

## 2019-09-20 DIAGNOSIS — F0391 Unspecified dementia with behavioral disturbance: Secondary | ICD-10-CM

## 2019-09-20 DIAGNOSIS — F03B18 Unspecified dementia, moderate, with other behavioral disturbance: Secondary | ICD-10-CM

## 2019-09-21 ENCOUNTER — Telehealth: Payer: Self-pay

## 2019-09-21 LAB — URINALYSIS, ROUTINE W REFLEX MICROSCOPIC
Bilirubin Urine: NEGATIVE
Hgb urine dipstick: NEGATIVE
Ketones, ur: NEGATIVE
Leukocytes,Ua: NEGATIVE
Nitrite: NEGATIVE
Specific Gravity, Urine: >=1.03 — AB (ref 1.000–1.030)
Total Protein, Urine: NEGATIVE
Urine Glucose: NEGATIVE
Urobilinogen, UA: 0.2 (ref 0.0–1.0)
pH: 5.5 (ref 5.0–8.0)

## 2019-09-21 LAB — CBC WITH DIFFERENTIAL/PLATELET
Basophils Absolute: 0 10*3/uL (ref 0.0–0.1)
Basophils Relative: 0.8 % (ref 0.0–3.0)
Eosinophils Absolute: 0.1 10*3/uL (ref 0.0–0.7)
Eosinophils Relative: 1.7 % (ref 0.0–5.0)
HCT: 39.5 % (ref 36.0–46.0)
Hemoglobin: 13 g/dL (ref 12.0–15.0)
Lymphocytes Relative: 16.4 % (ref 12.0–46.0)
Lymphs Abs: 0.9 10*3/uL (ref 0.7–4.0)
MCHC: 32.9 g/dL (ref 30.0–36.0)
MCV: 91.9 fl (ref 78.0–100.0)
Monocytes Absolute: 0.4 10*3/uL (ref 0.1–1.0)
Monocytes Relative: 6.3 % (ref 3.0–12.0)
Neutro Abs: 4.2 10*3/uL (ref 1.4–7.7)
Neutrophils Relative %: 74.8 % (ref 43.0–77.0)
Platelets: 217 10*3/uL (ref 150.0–400.0)
RBC: 4.29 Mil/uL (ref 3.87–5.11)
RDW: 13.9 % (ref 11.5–15.5)
WBC: 5.6 10*3/uL (ref 4.0–10.5)

## 2019-09-21 LAB — COMPREHENSIVE METABOLIC PANEL
ALT: 10 U/L (ref 0–35)
AST: 14 U/L (ref 0–37)
Albumin: 4 g/dL (ref 3.5–5.2)
Alkaline Phosphatase: 74 U/L (ref 39–117)
BUN: 19 mg/dL (ref 6–23)
CO2: 28 mEq/L (ref 19–32)
Calcium: 9.2 mg/dL (ref 8.4–10.5)
Chloride: 107 mEq/L (ref 96–112)
Creatinine, Ser: 1.03 mg/dL (ref 0.40–1.20)
GFR: 61.4 mL/min (ref >60.00)
Glucose, Bld: 160 mg/dL — ABNORMAL HIGH (ref 70–99)
Potassium: 4.1 mEq/L (ref 3.5–5.1)
Sodium: 143 mEq/L (ref 135–145)
Total Bilirubin: 0.3 mg/dL (ref 0.2–1.2)
Total Protein: 6.7 g/dL (ref 6.0–8.3)

## 2019-09-21 NOTE — Telephone Encounter (Signed)
Spoke to pt daughter she is take 1 tab of mamenda daily takes it around [place] , pt daughter is asking about a nurse to come in and asking about a nurse coming in to help with medication  pt is hiding her supplements, advised that if they have not been told pt needs the supplements may not want to fight the pt with taken them

## 2019-09-21 NOTE — Telephone Encounter (Signed)
Agree, pls provide info for daily aide/caregiver, thanks

## 2019-09-21 NOTE — Telephone Encounter (Signed)
-----   Message from Cameron Sprang, MD sent at 09/21/2019  2:16 PM EDT ----- Pls let daughter know the bloodwork and urinalysis were normal. Has she stopped the Aricept and Namenda as well? Has the drowsiness improved off medication?Thanks

## 2019-09-21 NOTE — Telephone Encounter (Signed)
Pt daughter has information for home health,

## 2019-09-21 NOTE — Telephone Encounter (Signed)
Spoke to pt daughter she is take 1 tab of mamenda daily takes it around [place] , pt daughter is asking about a nurse to come in and asking about a nurse coming in to help with medication  pt is hiding her supplements, advised that if they have not been told pt needs the supplements may not want to fight the pt with taken them.

## 2019-09-23 ENCOUNTER — Telehealth: Payer: Self-pay | Admitting: Neurology

## 2019-09-23 NOTE — Telephone Encounter (Signed)
[name] left msg with after hours service about needing some more info regarding her mom's medication because of them trying new medications. She did not leave medication name. Thanks!

## 2019-09-27 NOTE — Telephone Encounter (Signed)
[name] called in and left a message returning [name]'s call.

## 2019-09-27 NOTE — Telephone Encounter (Signed)
Pt daughter called back no answer voice mail left for to call back

## 2019-09-30 NOTE — Telephone Encounter (Signed)
Spoke to pt daughter she was asking questions about her medications, she has the pill packs started, pt mom is still sleeping late, no other questions at this time ,

## 2019-10-10 ENCOUNTER — Ambulatory Visit: Payer: PRIVATE HEALTH INSURANCE | Admitting: Neurology

## 2019-10-11 ENCOUNTER — Other Ambulatory Visit: Payer: Self-pay

## 2019-10-11 ENCOUNTER — Encounter: Payer: Self-pay | Admitting: Neurology

## 2019-10-11 ENCOUNTER — Ambulatory Visit (INDEPENDENT_AMBULATORY_CARE_PROVIDER_SITE_OTHER): Payer: Medicare Other | Admitting: Neurology

## 2019-10-11 VITALS — BP 152/79 | HR 79 | Ht 63.0 in | Wt 192.0 lb

## 2019-10-11 DIAGNOSIS — F0391 Unspecified dementia with behavioral disturbance: Secondary | ICD-10-CM | POA: Diagnosis not present

## 2019-10-11 DIAGNOSIS — F03B18 Unspecified dementia, moderate, with other behavioral disturbance: Secondary | ICD-10-CM

## 2019-10-11 MED ORDER — MEMANTINE HCL 10 MG PO TABS: ORAL_TABLET | ORAL | 3 refills | Status: DC

## 2019-10-11 MED ORDER — ESCITALOPRAM OXALATE 10 MG PO TABS: 10.0000 mg | ORAL_TABLET | Freq: Every day | ORAL | 3 refills | Status: DC

## 2019-10-11 NOTE — Progress Notes (Signed)
NEUROLOGY FOLLOW UP OFFICE NOTE  [name] [id number] 10-Sep-1931  HISTORY OF PRESENT ILLNESS: I had the pleasure of seeing [name] in follow-up in the neurology clinic on 10/11/2019.  The patient was last seen 3 months ago for moderate dementia with behavioral disturbance. She is again accompanied by her daughter [name] who helps supplement the history today.  [name] has called our office several times regarding behavioral changes that worsened with increase in Memantine dose. She has stopped the Donepezil and takes Memantine 10mg  every evening. She was hiding her medications in her room and was not taking them correctly. [name] was able to get all her prescription medications and now has them in pillpacks, they take her medications together at 6pm. She is also on Lexapro, Metformin, and statin. She is doing much better on this regimen, she is not sleeping all day and night or staying in bed all the time. She still hides all her vitamins in her room. She is getting up earlier, she plays cards every Monday. She does not drive. She had an active 6 days recently seeing other people. She says she spent 6 weeks in [place], her daughter shakes her head. She denies any headaches. She has occasional dizziness and uses her cane for balance. She reports the fall she had several months ago. [name] manages finances and meals.    History on Initial Assessment 02/24/2019: This is an 86 year old ambidextrous right-hand dominant woman with a history of hypertension, hyperlipidemia, diabetes, breast cancer, presenting for evaluation of memory loss. She states her memory is "pretty well."  She states she usually remembers to take her medications. Her daughter [name] is present during the e-visit and provides a different story. She has been living with her family since 45. Family started noticing memory changes at least 5 years ago, worse in the past year. She would repeat herself, lose things frequently. In the Fall of  last year, she would not come home until very late at night, they were now sure if she knew where she was. She started having difficulties managing finances. She had always been early paying her bills, however they found out her credit score went from 800 to 644 because she was forgetting to pay. [name] found out in March that bills were 2-3 months behind. For the past 2 years, she has had paranoia, accusing everybody of being a thief. She is convinced she saw her grandson reach over her one night to take money, which never happened. She states this is true and she asked him what he was there for at 4am last week. She states her grandson is cursing every time he comes in. She has not showered in 2.5 years and has not washed her hair this year. She only takes sponge baths. Her shower stall is full of things, their house looks like a hoarder house. They found out that her license expired in January 2020 but she continued to drive, so they took away her keys. She has not driven since the end of February. She denies getting lost, however [name] reports she called at least once last Fall asking how to get home from their local [place]. She denies burning food on the stove, [name] reminds her she has not cooked in a few weeks because she had burned something. She is irritated every single day. She states she feels fine until people ask me thing that are "not that." She was started on Donepezil 10mg  daily by her PCP. Several paternal  aunts had Alzheimer's disease. Another relative (maternal grandmother's first cousin) had early onset dementia. No history of significant head injuries or alcohol use.   She has left shoulder pain. She has occasional numbness in her feet that resolves when standing. She fell a month ago when she tripped and was on the floor all night. It appears she had incontinence while on the floor, she does not remember this. She denies any headaches, dizziness, diplopia, dysarthria/dysphagia, neck/back  pain, focal numbness/tingling/weakness, bowel/bladder dysfunction, anosmia, or tremors.   PAST MEDICAL HISTORY: Past Medical History:  Diagnosis Date  . Arthritis    knees  . Bladder atony    uses a large pad for incontinence   . 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 GERD (gastroesophageal reflux disease)    Uses PPI- only on occasion   . History of hiatal hernia   . Hypertension    for treatment, pt. unsure why this is noted in her hx.    . Normal cardiac stress test 2000   [facility] at [facility]  . Thyroid nodule   . Thyroid nodule 2007   biopsy- benign    MEDICATIONS: Current Outpatient Medications on File Prior to Visit  Medication Sig Dispense Refill  . B Complex-C (SUPER B COMPLEX PO) Take 1 tablet by mouth daily.     [name] Bioflavonoid Products (GRAPE SEED PO) Take 1 tablet by mouth daily. Grape seed 50mg  & magnesium 15mg  OTC     . cholecalciferol (VITAMIN D) 1000 units tablet Take 1,000 Units by mouth daily.    [name] donepezil (ARICEPT) 10 MG tablet Take 10 mg by mouth at bedtime (Patient not taking)    . escitalopram (LEXAPRO) 10 MG tablet Take 1 tablet (10 mg total) by mouth at bedtime. 30 tablet 11  . Flaxseed, Linseed, ([employer] FLAXSEED OIL PO) Take 1,400 mg by mouth daily.    . Garlic 9381 MG CAPS Take 1,000 mg by mouth daily.      . memantine (NAMENDA) 10 MG tablet Take 1 tablet twice a day (Patient taking 1 tablet every evening) 60 tablet 11  . metFORMIN (GLUCOPHAGE-XR) 500 MG 24 hr tablet Take 500 mg by mouth daily after supper.     . methocarbamol (ROBAXIN) 500 MG tablet Take 1-2 tablets (500-1,000 mg total) by mouth every 6 (six) hours as needed for muscle spasms. 60 tablet 0  . Multiple Vitamins-Minerals (MULTIVITAMIN WITH MINERALS) tablet Take 1 tablet by mouth daily.     [name] omega-3 acid ethyl esters (LOVAZA) 1 G capsule Take 1 g by mouth daily.      . Omega-3 Fatty Acids (EQL OMEGA 3 FISH OIL) 1200 MG CAPS Take 1 capsule by mouth daily.    [name] omeprazole (PRILOSEC) 10 MG capsule  Take 10 mg by mouth daily as needed.     . simvastatin (ZOCOR) 20 MG tablet Take 20 mg by mouth daily after supper.     . TRAVATAN Z 0.004 % SOLN ophthalmic solution INSTILL 1 DROP INTO BOTH EYES DAILY AT BEDTIME    . Turmeric Curcumin 500 MG CAPS Take 1 capsule by mouth daily. On hold    . vitamin C (ASCORBIC ACID) 500 MG tablet Take 1,000 mg by mouth daily.       No current facility-administered medications on file prior to visit.    ALLERGIES: Allergies  Allergen Reactions  . Penicillins Hives  . Shellfish Allergy Hives    Shrimp only  . Tape Rash    FAMILY HISTORY: Family  History  Problem Relation Age of Onset  . Cancer Maternal Grandmother 77       breast  . Breast cancer Maternal Grandmother   . Cancer Paternal Aunt   . Cancer Paternal Uncle 6       colon    SOCIAL HISTORY: Social History   Socioeconomic History  . Marital status: Divorced    Spouse name: Not on file  . Number of children: 1  . Years of education: Not on file  . Highest education level: Not on file  Occupational History  . Occupation: college professor    Comment: Retired    Fish farm manager: RETIRED  Tobacco Use  . Smoking status: Former Smoker    Quit date: 03/10/1981    Years since quitting: 38.6  . Smokeless tobacco: Never Used  Vaping Use  . Vaping Use: Never used  Substance and Sexual Activity  . Alcohol use: No  . Drug use: No  . Sexual activity: Not Currently  Other Topics Concern  . Not on file  Social History Narrative   Lives with daughter and two grandchildren      College education      Left handed            Social Determinants of Health   Financial Resource Strain:   . Difficulty of Paying Living Expenses:   Food Insecurity:   . Worried About Charity fundraiser in the Last Year:   . Arboriculturist in the Last Year:   Transportation Needs:   . Film/video editor (Medical):   [name] Lack of Transportation (Non-Medical):   Physical Activity:   . Days of Exercise  per Week:   . Minutes of Exercise per Session:   Stress:   . Feeling of Stress :   Social Connections:   . Frequency of Communication with Friends and Family:   . Frequency of Social Gatherings with Friends and Family:   . Attends Religious Services:   . Active Member of Clubs or Organizations:   . Attends Archivist Meetings:   [name] Marital Status:   Intimate Partner Violence:   . Fear of Current or Ex-Partner:   . Emotionally Abused:   [name] Physically Abused:   . Sexually Abused:      PHYSICAL EXAM: Vitals:   10/11/19 1610  BP: (!) 152/79  Pulse: 79  SpO2: 95%   General: No acute distress Head:  Normocephalic/atraumatic Skin/Extremities: No rash, no edema Neurological Exam: alert and oriented to person, place, and time. No aphasia or dysarthria. Fund of knowledge is appropriate.  Recent and remote memory are impaired, 1/3 delayed recall. She had difficulty following instructions to remember the 3 words, trying to give a name of a church instead, needed repeat instructions.  Attention and concentration are normal, 5/5 WORLD backwards, 3/5 serial 7s. Able to name objects and repeat phrases. Cranial nerves: Pupils equal, round, reactive to light.Extraocular movements intact with no nystagmus. Visual fields full. No facial asymmetry. Motor: Bulk and tone normal, muscle strength 5/5 throughout with no pronator drift.  Finger to nose testing intact.  Gait slow and cautious, no ataxia  IMPRESSION: This is an 86 yo ambidextrous right-hand dominant woman with a history of hypertension, hyperlipidemia, diabetes, breast cancer, with dementia, likely Alzheimer's disease with behavioral disturbance. Diagnosis discussed with patient and daughter. MRI brain showed diffuse atrophy, mild to moderate chronic microvascular disease. She was having more behavioral changes and now with adjustment in medication, she is in  a more stable situation. Continue current regimen of Memantine 10mg  every  evening, Lexapro 10mg  every evening. May increase dose of Lexapro if needed. Continue close supervision, she does not drive. Follow-up in 6 months, they know to call for any changes.    Thank you for allowing me to participate in her care.  Please do not hesitate to call for any questions or concerns.   Ellouise Newer, M.D.   CC: Dr. Laurann Montana

## 2019-10-11 NOTE — Patient Instructions (Signed)
Great to see you. Continue current medication regimen. Follow-up in 6 months, call for any changes.  FALL PRECAUTIONS: Be cautious when walking. Scan the area for obstacles that may increase the risk of trips and falls. When getting up in the mornings, sit up at the edge of the bed for a few minutes before getting out of bed. Consider elevating the bed at the head end to avoid drop of blood pressure when getting up. Walk always in a well-lit room (use night lights in the walls). Avoid area rugs or power cords from appliances in the middle of the walkways. Use a walker or a cane if necessary and consider physical therapy for balance exercise. Get your eyesight checked regularly.   HOME SAFETY: Consider the safety of the kitchen when operating appliances like stoves, microwave oven, and blender. Consider having supervision and share cooking responsibilities until no longer able to participate in those. Accidents with firearms and other hazards in the house should be identified and addressed as well.   ABILITY TO BE LEFT ALONE: If patient is unable to contact 911 operator, consider using LifeLine, or when the need is there, arrange for someone to stay with patients. Smoking is a fire hazard, consider supervision or cessation. Risk of wandering should be assessed by caregiver and if detected at any point, supervision and safe proof recommendations should be instituted.  RECOMMENDATIONS FOR ALL PATIENTS WITH MEMORY PROBLEMS: 1. Continue to exercise (Recommend 30 minutes of walking everyday, or 3 hours every week) 2. Increase social interactions - continue going to [place] and enjoy social gatherings with friends and family 3. Eat healthy, avoid fried foods and eat more fruits and vegetables 4. Maintain adequate blood pressure, blood sugar, and blood cholesterol level. Reducing the risk of stroke and cardiovascular disease also helps promoting better memory. 5. Avoid stressful situations. Live a simple life  and avoid aggravations. Organize your time and prepare for the next day in anticipation. 6. Sleep well, avoid any interruptions of sleep and avoid any distractions in the bedroom that may interfere with adequate sleep quality 7. Avoid sugar, avoid sweets as there is a strong link between excessive sugar intake, diabetes, and cognitive impairment The Mediterranean diet has been shown to help patients reduce the risk of progressive memory disorders and reduces cardiovascular risk. This includes eating fish, eat fruits and green leafy vegetables, nuts like almonds and hazelnuts, walnuts, and also use olive oil. Avoid fast foods and fried foods as much as possible. Avoid sweets and sugar as sugar use has been linked to worsening of memory function.  There is always a concern of gradual progression of memory problems. If this is the case, then we may need to adjust level of care according to patient needs. Support, both to the patient and caregiver, should then be put into place.

## 2019-11-16 ENCOUNTER — Other Ambulatory Visit: Payer: Self-pay

## 2019-11-16 ENCOUNTER — Telehealth: Payer: Self-pay | Admitting: Neurology

## 2019-11-16 MED ORDER — ESCITALOPRAM OXALATE 10 MG PO TABS: 10.0000 mg | ORAL_TABLET | Freq: Every day | ORAL | 0 refills | Status: DC

## 2019-11-16 NOTE — Telephone Encounter (Signed)
Spoke to pt daughter informed her that pt may be depressed. We can try increasing the Lexapro to 20mg  daily to see if this helps perk her up, however what may be helpful is just trying to get her out of bed and out of the house doing activities with an aide. Script for 30 day supply of 10mg  to add to the 10 mg of lexapro in the pill pack to see how this help the pt.

## 2019-11-16 NOTE — Telephone Encounter (Signed)
I think she may be depressed. We can try increasing the Lexapro to 20mg  daily to see if this helps perk her up, however what may be helpful is just trying to get her out of bed and out of the house doing activities with an aide. Thanks

## 2019-11-16 NOTE — Telephone Encounter (Signed)
Patient's daughter [name] called in about the patient's medications. She states the patient is in bed all day. Despite the reduction in her medications a little while back, the sleeping has not seemed to change. She is worried she isn't going to live very long just sleeping all the time.

## 2019-11-18 ENCOUNTER — Other Ambulatory Visit: Payer: Self-pay | Admitting: Family Medicine

## 2019-11-18 DIAGNOSIS — Z1231 Encounter for screening mammogram for malignant neoplasm of breast: Secondary | ICD-10-CM

## 2019-12-01 ENCOUNTER — Ambulatory Visit
Admission: RE | Admit: 2019-12-01 | Discharge: 2019-12-01 | Disposition: A | Discharge status: Home / Self Care | Payer: PRIVATE HEALTH INSURANCE | Source: Ambulatory Visit | Attending: Family Medicine | Admitting: Family Medicine

## 2019-12-01 ENCOUNTER — Other Ambulatory Visit: Payer: Self-pay

## 2019-12-01 DIAGNOSIS — Z1231 Encounter for screening mammogram for malignant neoplasm of breast: Secondary | ICD-10-CM

## 2019-12-06 ENCOUNTER — Ambulatory Visit: Payer: PRIVATE HEALTH INSURANCE | Admitting: Podiatry

## 2019-12-12 ENCOUNTER — Other Ambulatory Visit: Payer: Self-pay | Admitting: Neurology

## 2019-12-14 ENCOUNTER — Ambulatory Visit: Payer: PRIVATE HEALTH INSURANCE | Admitting: Neurology

## 2019-12-23 ENCOUNTER — Encounter: Payer: Self-pay | Admitting: Podiatry

## 2019-12-23 ENCOUNTER — Ambulatory Visit (INDEPENDENT_AMBULATORY_CARE_PROVIDER_SITE_OTHER): Payer: Medicare Other | Admitting: Podiatry

## 2019-12-23 ENCOUNTER — Other Ambulatory Visit: Payer: Self-pay

## 2019-12-23 DIAGNOSIS — M79674 Pain in right toe(s): Secondary | ICD-10-CM

## 2019-12-23 DIAGNOSIS — L84 Corns and callosities: Secondary | ICD-10-CM

## 2019-12-23 DIAGNOSIS — M2042 Other hammer toe(s) (acquired), left foot: Secondary | ICD-10-CM

## 2019-12-23 DIAGNOSIS — M79675 Pain in left toe(s): Secondary | ICD-10-CM | POA: Diagnosis not present

## 2019-12-23 DIAGNOSIS — B351 Tinea unguium: Secondary | ICD-10-CM

## 2019-12-23 DIAGNOSIS — M2041 Other hammer toe(s) (acquired), right foot: Secondary | ICD-10-CM

## 2019-12-23 DIAGNOSIS — E119 Type 2 diabetes mellitus without complications: Secondary | ICD-10-CM | POA: Diagnosis not present

## 2019-12-29 ENCOUNTER — Encounter: Payer: Self-pay | Admitting: Podiatry

## 2019-12-29 NOTE — Progress Notes (Signed)
Subjective: [name] presents today preventative diabetic foot care and painful mycotic nails b/l that are difficult to trim. Pain interferes with ambulation. Aggravating factors include wearing enclosed shoe gear. Pain is relieved with periodic professional debridement.  Kelton Pillar, MD is patient's PCP.   Her daughter is present during today's visit. Daughter voices no new pedal concerns on today's visit.  Past Medical History:  Diagnosis Date  . Arthritis    knees  . Bladder atony    uses a large pad for incontinence   . 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 Dementia ([place])   . GERD (gastroesophageal reflux disease)    Uses PPI- only on occasion   . History of hiatal hernia   . Hypertension    for treatment, pt. unsure why this is noted in her hx.    . Normal cardiac stress test 2000   done at [facility]  . Thyroid nodule   . Thyroid nodule 2007   biopsy- benign     Patient Active Problem List   Diagnosis Date Noted  . Acute midline low back pain with left-sided sciatica 01/05/2018  . Cervical myelopathy ([place]) 08/15/2016  . S/P total knee replacement 07/09/2015  . Chronic pain of both knees 11/09/2014  . Breast cancer of upper-outer quadrant of left female breast ([place]) 03/02/2013  . Bilateral wrist pain 10/29/2012  . Generalized osteoarthritis of multiple sites 05/25/2012  . Knee osteoarthritis 05/25/2012    Current Outpatient Medications on File Prior to Visit  Medication Sig Dispense Refill  . B Complex-C (SUPER B COMPLEX PO) Take 1 tablet by mouth daily.     [name] Bioflavonoid Products (GRAPE SEED PO) Take 1 tablet by mouth daily. Grape seed 50mg  & magnesium 15mg  OTC     . cholecalciferol (VITAMIN D) 1000 units tablet Take 1,000 Units by mouth daily.    . ciprofloxacin (CIPRO) 250 MG tablet Take 250 mg by mouth 2 (two) times daily.    [name] escitalopram (LEXAPRO) 10 MG tablet Take 1 tablet (10 mg total) by mouth at bedtime. 30 tablet 0  . escitalopram (LEXAPRO) 20 MG  tablet TAKE ONE TABLET AT BEDTIME. 90 tablet 3  . Flaxseed, Linseed, (CVS FLAXSEED OIL PO) Take 1,400 mg by mouth daily.    . Garlic 0623 MG CAPS Take 1,000 mg by mouth daily.      . memantine (NAMENDA) 10 MG tablet Take 1 tablet daily 90 tablet 3  . metFORMIN (GLUCOPHAGE-XR) 500 MG 24 hr tablet Take 500 mg by mouth daily after supper.     . methocarbamol (ROBAXIN) 500 MG tablet Take 1-2 tablets (500-1,000 mg total) by mouth every 6 (six) hours as needed for muscle spasms. (Patient not taking: Reported on 10/11/2019) 60 tablet 0  . Multiple Vitamins-Minerals (MULTIVITAMIN WITH MINERALS) tablet Take 1 tablet by mouth daily.     . nitrofurantoin (MACRODANTIN) 100 MG capsule Take by mouth at bedtime.    [name] omega-3 acid ethyl esters (LOVAZA) 1 G capsule Take 1 g by mouth daily.      . Omega-3 Fatty Acids (EQL OMEGA 3 FISH OIL) 1200 MG CAPS Take 1 capsule by mouth daily.    [name] omeprazole (PRILOSEC) 10 MG capsule Take 10 mg by mouth daily as needed.     . simvastatin (ZOCOR) 20 MG tablet Take 20 mg by mouth daily after supper.     . simvastatin (ZOCOR) 40 MG tablet Take 40 mg by mouth at bedtime.    Dorette Grate Z  0.004 % SOLN ophthalmic solution INSTILL 1 DROP INTO BOTH EYES DAILY AT BEDTIME    . Turmeric Curcumin 500 MG CAPS Take 1 capsule by mouth daily. On hold    . vitamin C (ASCORBIC ACID) 500 MG tablet Take 1,000 mg by mouth daily.       No current facility-administered medications on file prior to visit.     Allergies  Allergen Reactions  . Peanut-Containing Drug Products   . Penicillins Hives  . Shellfish Allergy Hives    Shrimp only  . Tape Rash    Objective: [name] is a pleasant 86 y.o. African American female WD, WN in NAD. AAO x 3.  There were no vitals filed for this visit.  Vascular Examination: Neurovascular status unchanged b/l lower extremities. Capillary fill time to digits <3 seconds b/l lower extremities. Palpable pedal pulses b/l LE. Pedal hair absent. Lower  extremity skin temperature gradient within normal limits.  Dermatological Examination: Pedal skin with normal turgor, texture and tone bilaterally. No open wounds bilaterally. No interdigital macerations bilaterally. Toenails 1-5 b/l elongated, discolored, dystrophic, thickened, crumbly with subungual debris and tenderness to dorsal palpation. Hyperkeratotic lesion(s) L 5th toe.  No erythema, no edema, no drainage, no flocculence.  Musculoskeletal: Normal muscle strength 5/5 to all lower extremity muscle groups bilaterally. No pain crepitus or joint limitation noted with ROM b/l. Hallux valgus with bunion deformity noted b/l lower extremities. Hammertoes noted to the 2-5 bilaterally.  Neurological Examination: Protective sensation intact 5/5 intact bilaterally with 10g monofilament b/l. Vibratory sensation intact b/l. Proprioception intact bilaterally.   Assessment: 1. Pain due to onychomycosis of toenails of both feet   2. Corns   3. Acquired hammertoes of both feet   4. Controlled type 2 diabetes mellitus without complication, without long-term current use of insulin ([place])     Plan: -Examined patient. -No new findings. No new orders. -Continue diabetic foot care principles. -Toenails 1-5 b/l were debrided in length and girth with sterile nail nippers and dremel without iatrogenic bleeding.  -Corn(s) L 5th toe pared utilizing sterile scalpel blade without complication or incident. Total number debrided=1. -Patient to report any pedal injuries to medical professional immediately. -Patient to continue soft, supportive shoe gear daily. -Patient/POA to call should there be question/concern in the interim.  Return in about 3 months (around 03/24/2020) for diabetic nail trim.  Marzetta Board, DPM

## 2020-03-30 ENCOUNTER — Ambulatory Visit: Payer: Medicare Other | Admitting: Podiatry

## 2020-04-05 DIAGNOSIS — R350 Frequency of micturition: Secondary | ICD-10-CM | POA: Diagnosis not present

## 2020-04-12 DIAGNOSIS — R350 Frequency of micturition: Secondary | ICD-10-CM | POA: Diagnosis not present

## 2020-04-17 DIAGNOSIS — R350 Frequency of micturition: Secondary | ICD-10-CM | POA: Diagnosis not present

## 2020-04-18 ENCOUNTER — Ambulatory Visit: Payer: Medicare Other | Admitting: Podiatry

## 2020-04-24 DIAGNOSIS — R350 Frequency of micturition: Secondary | ICD-10-CM | POA: Diagnosis not present

## 2020-05-01 DIAGNOSIS — R350 Frequency of micturition: Secondary | ICD-10-CM | POA: Diagnosis not present

## 2020-05-08 DIAGNOSIS — R350 Frequency of micturition: Secondary | ICD-10-CM | POA: Diagnosis not present

## 2020-05-15 DIAGNOSIS — R350 Frequency of micturition: Secondary | ICD-10-CM | POA: Diagnosis not present

## 2020-05-18 ENCOUNTER — Ambulatory Visit: Payer: Medicare Other | Admitting: Podiatry

## 2020-05-31 DIAGNOSIS — R350 Frequency of micturition: Secondary | ICD-10-CM | POA: Diagnosis not present

## 2020-06-12 ENCOUNTER — Other Ambulatory Visit: Payer: Self-pay

## 2020-06-12 ENCOUNTER — Ambulatory Visit: Payer: Medicare Other | Admitting: Neurology

## 2020-06-12 ENCOUNTER — Encounter: Payer: Self-pay | Admitting: Neurology

## 2020-06-12 VITALS — BP 149/73 | HR 78 | Ht 63.0 in | Wt 196.6 lb

## 2020-06-12 DIAGNOSIS — F0391 Unspecified dementia with behavioral disturbance: Secondary | ICD-10-CM | POA: Diagnosis not present

## 2020-06-12 DIAGNOSIS — F03B18 Unspecified dementia, moderate, with other behavioral disturbance: Secondary | ICD-10-CM

## 2020-06-12 MED ORDER — MEMANTINE HCL 10 MG PO TABS: ORAL_TABLET | ORAL | 3 refills | Status: DC

## 2020-06-12 MED ORDER — ESCITALOPRAM OXALATE 20 MG PO TABS: 1.0000 | ORAL_TABLET | Freq: Every day | ORAL | 3 refills | Status: DC

## 2020-06-12 NOTE — Patient Instructions (Signed)
Good to see you! Continue all your medications. Follow-up in 6-8 months, call for any changes.   FALL PRECAUTIONS: Be cautious when walking. Scan the area for obstacles that may increase the risk of trips and falls. When getting up in the mornings, sit up at the edge of the bed for a few minutes before getting out of bed. Consider elevating the bed at the head end to avoid drop of blood pressure when getting up. Walk always in a well-lit room (use night lights in the walls). Avoid area rugs or power cords from appliances in the middle of the walkways. Use a walker or a cane if necessary and consider physical therapy for balance exercise. Get your eyesight checked regularly.   HOME SAFETY: Consider the safety of the kitchen when operating appliances like stoves, microwave oven, and blender. Consider having supervision and share cooking responsibilities until no longer able to participate in those. Accidents with firearms and other hazards in the house should be identified and addressed as well.   ABILITY TO BE LEFT ALONE: If patient is unable to contact 911 operator, consider using LifeLine, or when the need is there, arrange for someone to stay with patients. Smoking is a fire hazard, consider supervision or cessation. Risk of wandering should be assessed by caregiver and if detected at any point, supervision and safe proof recommendations should be instituted.   RECOMMENDATIONS FOR ALL PATIENTS WITH MEMORY PROBLEMS: 1. Continue to exercise (Recommend 30 minutes of walking everyday, or 3 hours every week) 2. Increase social interactions - continue going to [place] and enjoy social gatherings with friends and family 3. Eat healthy, avoid fried foods and eat more fruits and vegetables 4. Maintain adequate blood pressure, blood sugar, and blood cholesterol level. Reducing the risk of stroke and cardiovascular disease also helps promoting better memory. 5. Avoid stressful situations. Live a simple life  and avoid aggravations. Organize your time and prepare for the next day in anticipation. 6. Sleep well, avoid any interruptions of sleep and avoid any distractions in the bedroom that may interfere with adequate sleep quality 7. Avoid sugar, avoid sweets as there is a strong link between excessive sugar intake, diabetes, and cognitive impairment The Mediterranean diet has been shown to help patients reduce the risk of progressive memory disorders and reduces cardiovascular risk. This includes eating fish, eat fruits and green leafy vegetables, nuts like almonds and hazelnuts, walnuts, and also use olive oil. Avoid fast foods and fried foods as much as possible. Avoid sweets and sugar as sugar use has been linked to worsening of memory function.  There is always a concern of gradual progression of memory problems. If this is the case, then we may need to adjust level of care according to patient needs. Support, both to the patient and caregiver, should then be put into place.

## 2020-06-12 NOTE — Progress Notes (Signed)
NEUROLOGY FOLLOW UP OFFICE NOTE  [name] [id number] 05/05/1931  HISTORY OF PRESENT ILLNESS: I had the pleasure of seeing [name] in follow-up in the neurology clinic on 06/12/2020.  The patient was last seen 8 months ago for moderate dementia with behavioral disturbance. She is again accompanied by her daughter [name] who helps supplement the history today. Since her last visit, [name] called in 11/2019 to report that she is in bed all day despite reduction in medication, sleeping has not seemed to change. Lexapro dose was increased to 20mg  daily. She is on Memantine 10mg  qhs, she had more behavioral changes on BID dosing and Donepezil. She feels her memory is pretty good. She has a nurse coming twice a week. She and Kimberly Sheffield do medications together to ensure compliance. She is independent with dressing and bathing. [name] manages finances and meals. She is having hallucinations about seeing a mouse in the house, they had the exterminator come and there has been no evidence of infestation. She has seen a round black object coming out from under the bed or coming out of the ceiling. She hears things, she reports hearing a female voice but it does not talk to her. These usually occur at night. She plays cards every week. She has had 3 falls in the last 4-5 months, last fall was 6 weeks ago. No head injuries. [name] thinks she possibly slid, shoes were off each time. She has rare numbness/tingling in her hands. Her left knee bothers her sometimes. 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 The nurse coming twice a week comes at 1pm and gets her going.    History on Initial Assessment 02/24/2019: This is an 87 year old ambidextrous right-hand dominant woman with a history of hypertension, hyperlipidemia, diabetes, breast cancer, presenting for evaluation of memory loss. She states her memory is "pretty  well."  She states she usually remembers to take her medications. Her daughter [name] is present during the e-visit and provides a different story. She has been living with her family since 2. Family started noticing memory changes at least 5 years ago, worse in the past year. She would repeat herself, lose things frequently. In the Fall of last year, she would not come home until very late at night, they were now sure if she knew where she was. She started having difficulties managing finances. She had always been early paying her bills, however they found out her credit score went from 800 to 644 because she was forgetting to pay. [name] found out in March that bills were 2-3 months behind. For the past 2 years, she has had paranoia, accusing everybody of being a thief. She is convinced she saw her grandson reach over her one night to take money, which never happened. She states this is true and she asked him what he was there for at 4am last week. She states her grandson is cursing every time he comes in. She has not showered in 2.5 years and has not washed her hair this year. She only takes sponge baths. Her shower stall is full of things, their house looks like a hoarder house. They found out that her license expired in January 2020 but she continued to drive, so they took away her keys. She has not driven since the end of February. She denies getting lost, however [name] reports she  called at least once last Fall asking how to get home from their local [place]. She denies burning food on the stove, [name] reminds her she has not cooked in a few weeks because she had burned something. She is irritated every single day. She states she feels fine until people ask me thing that are "not that." She was started on Donepezil 10mg  daily by her PCP. Several paternal aunts had Alzheimer's disease. Another relative (maternal grandmother's first cousin) had early onset dementia. No history of significant head injuries  or alcohol use.   She has left shoulder pain. She has occasional numbness in her feet that resolves when standing. She fell a month ago when she tripped and was on the floor all night. It appears she had incontinence while on the floor, she does not remember this. She denies any headaches, dizziness, diplopia, dysarthria/dysphagia, neck/back pain, focal numbness/tingling/weakness, bowel/bladder dysfunction, anosmia, or tremors.   PAST MEDICAL HISTORY: Past Medical History:  Diagnosis Date  . Arthritis    knees  . Bladder atony    uses a large pad for incontinence   . 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 Dementia ([place])   . GERD (gastroesophageal reflux disease)    Uses PPI- only on occasion   . History of hiatal hernia   . Hypertension    for treatment, pt. unsure why this is noted in her hx.    . Normal cardiac stress test 2000   done at [facility]  . Thyroid nodule   . Thyroid nodule 2007   biopsy- benign    MEDICATIONS: Current Outpatient Medications on File Prior to Visit  Medication Sig Dispense Refill  . B Complex-C (SUPER B COMPLEX PO) Take 1 tablet by mouth daily.     [name] Bioflavonoid Products (GRAPE SEED PO) Take 1 tablet by mouth daily. Grape seed 50mg  & magnesium 15mg  OTC     . cholecalciferol (VITAMIN D) 1000 units tablet Take 1,000 Units by mouth daily.    . ciprofloxacin (CIPRO) 250 MG tablet Take 250 mg by mouth 2 (two) times daily.    [name] escitalopram (LEXAPRO) 10 MG tablet Take 1 tablet (10 mg total) by mouth at bedtime. 30 tablet 0  . escitalopram (LEXAPRO) 20 MG tablet TAKE ONE TABLET AT BEDTIME. 90 tablet 3  . Flaxseed, Linseed, ([employer] FLAXSEED OIL PO) Take 1,400 mg by mouth daily.    . Garlic 0454 MG CAPS Take 1,000 mg by mouth daily.      . memantine (NAMENDA) 10 MG tablet Take 1 tablet daily 90 tablet 3  . metFORMIN (GLUCOPHAGE-XR) 500 MG 24 hr tablet Take 500 mg by mouth daily after supper.     . methocarbamol (ROBAXIN) 500 MG tablet Take 1-2 tablets (500-1,000 mg  total) by mouth every 6 (six) hours as needed for muscle spasms. (Patient not taking: Reported on 10/11/2019) 60 tablet 0  . Multiple Vitamins-Minerals (MULTIVITAMIN WITH MINERALS) tablet Take 1 tablet by mouth daily.     . nitrofurantoin (MACRODANTIN) 100 MG capsule Take by mouth at bedtime.    [name] omega-3 acid ethyl esters (LOVAZA) 1 G capsule Take 1 g by mouth daily.      . Omega-3 Fatty Acids (EQL OMEGA 3 FISH OIL) 1200 MG CAPS Take 1 capsule by mouth daily.    [name] omeprazole (PRILOSEC) 10 MG capsule Take 10 mg by mouth daily as needed.     . simvastatin (ZOCOR) 20 MG tablet Take 20 mg by mouth daily after supper.     [name]  simvastatin (ZOCOR) 40 MG tablet Take 40 mg by mouth at bedtime.    . TRAVATAN Z 0.004 % SOLN ophthalmic solution INSTILL 1 DROP INTO BOTH EYES DAILY AT BEDTIME    . Turmeric Curcumin 500 MG CAPS Take 1 capsule by mouth daily. On hold    . vitamin C (ASCORBIC ACID) 500 MG tablet Take 1,000 mg by mouth daily.       No current facility-administered medications on file prior to visit.    ALLERGIES: Allergies  Allergen Reactions  . Peanut-Containing Drug Products   . Penicillins Hives  . Shellfish Allergy Hives    Shrimp only  . Tape Rash    FAMILY HISTORY: Family History  Problem Relation Age of Onset  . Cancer Maternal Grandmother 53       breast  . Breast cancer Maternal Grandmother   . Cancer Paternal Aunt   . Cancer Paternal Uncle 72       colon    SOCIAL HISTORY: Social History   Socioeconomic History  . Marital status: Divorced    Spouse name: Not on file  . Number of children: 1  . Years of education: Not on file  . Highest education level: Not on file  Occupational History  . Occupation: college professor    Comment: Retired    Fish farm manager: RETIRED  Tobacco Use  . Smoking status: Former Smoker    Quit date: 03/10/1981    Years since quitting: 39.2  . Smokeless tobacco: Never Used  Vaping Use  . Vaping Use: Never used  Substance and Sexual Activity   . Alcohol use: No  . Drug use: No  . Sexual activity: Not Currently  Other Topics Concern  . Not on file  Social History Narrative   Lives with daughter and two grandchildren      College education      Left handed            Social Determinants of Health   Financial Resource Strain: Not on file  Food Insecurity: Not on file  Transportation Needs: Not on file  Physical Activity: Not on file  Stress: Not on file  Social Connections: Not on file  Intimate Partner Violence: Not on file     PHYSICAL EXAM: Vitals:   06/12/20 1543  BP: (!) 149/73  Pulse: 78  SpO2: 94%   General: No acute distress Head:  Normocephalic/atraumatic Skin/Extremities: No rash, no edema Neurological Exam: alert and oriented to person, place, month. Year is 2024, day is Saturday or Sunday. Speech slightly tangential at times. She is able to name pen, unable to name button. 5/5 WORLD backwards. 0/3 delayed recall. Cranial nerves: Pupils equal, round. Extraocular movements intact with no nystagmus. Visual fields full.  No facial asymmetry.  Motor: Bulk and tone normal, muscle strength 5/5 throughout with no pronator drift.   Finger to nose testing intact.  Gait slow and cautious with cane. No tremors.     IMPRESSION: This is an 87 yo ambidextrous right-hand dominant woman with a history of hypertension, hyperlipidemia, diabetes, breast cancer, with dementia, likely Alzheimer's disease with behavioral disturbance. MRI brain showed diffuse atrophy, mild to moderate chronic microvascular disease. Symptoms overall stable, she continues to have occasional hallucinations. There is reversal of sleep cycle, discussed increasing activities during the day, looking into day programs [name] feels she will be hostil about this). We agreed to hold off on antipsychotics for now. Continue Lexapro 20mg  daily and Memantine 10mg  daily. Continue 24/7 care. Follow-up in  6-8 months, call for any changes.   Thank you for  allowing me to participate in her care.  Please do not hesitate to call for any questions or concerns.   Ellouise Newer, M.D.   CC: Dr. Laurann Montana

## 2020-07-18 ENCOUNTER — Ambulatory Visit: Payer: Medicare Other | Admitting: Podiatry

## 2020-07-31 DIAGNOSIS — R350 Frequency of micturition: Secondary | ICD-10-CM | POA: Diagnosis not present

## 2020-08-09 DIAGNOSIS — E1169 Type 2 diabetes mellitus with other specified complication: Secondary | ICD-10-CM | POA: Diagnosis not present

## 2020-08-09 DIAGNOSIS — R413 Other amnesia: Secondary | ICD-10-CM | POA: Diagnosis not present

## 2020-08-09 DIAGNOSIS — M1990 Unspecified osteoarthritis, unspecified site: Secondary | ICD-10-CM | POA: Diagnosis not present

## 2020-08-09 DIAGNOSIS — Z1389 Encounter for screening for other disorder: Secondary | ICD-10-CM | POA: Diagnosis not present

## 2020-08-09 DIAGNOSIS — E7800 Pure hypercholesterolemia, unspecified: Secondary | ICD-10-CM | POA: Diagnosis not present

## 2020-08-09 DIAGNOSIS — Z0000 Encounter for general adult medical examination without abnormal findings: Secondary | ICD-10-CM | POA: Diagnosis not present

## 2020-08-09 DIAGNOSIS — Z853 Personal history of malignant neoplasm of breast: Secondary | ICD-10-CM | POA: Diagnosis not present

## 2020-08-09 DIAGNOSIS — K219 Gastro-esophageal reflux disease without esophagitis: Secondary | ICD-10-CM | POA: Diagnosis not present

## 2020-08-09 DIAGNOSIS — E559 Vitamin D deficiency, unspecified: Secondary | ICD-10-CM | POA: Diagnosis not present

## 2020-08-16 DIAGNOSIS — H401132 Primary open-angle glaucoma, bilateral, moderate stage: Secondary | ICD-10-CM | POA: Diagnosis not present

## 2020-08-16 DIAGNOSIS — H43813 Vitreous degeneration, bilateral: Secondary | ICD-10-CM | POA: Diagnosis not present

## 2020-08-16 DIAGNOSIS — E119 Type 2 diabetes mellitus without complications: Secondary | ICD-10-CM | POA: Diagnosis not present

## 2020-08-16 DIAGNOSIS — H2513 Age-related nuclear cataract, bilateral: Secondary | ICD-10-CM | POA: Diagnosis not present

## 2020-08-22 DIAGNOSIS — M5410 Radiculopathy, site unspecified: Secondary | ICD-10-CM | POA: Diagnosis not present

## 2020-08-22 DIAGNOSIS — M1712 Unilateral primary osteoarthritis, left knee: Secondary | ICD-10-CM | POA: Diagnosis not present

## 2020-08-24 DIAGNOSIS — M1712 Unilateral primary osteoarthritis, left knee: Secondary | ICD-10-CM | POA: Diagnosis not present

## 2020-08-30 DIAGNOSIS — L988 Other specified disorders of the skin and subcutaneous tissue: Secondary | ICD-10-CM | POA: Diagnosis not present

## 2020-09-21 DIAGNOSIS — L988 Other specified disorders of the skin and subcutaneous tissue: Secondary | ICD-10-CM | POA: Diagnosis not present

## 2020-09-21 DIAGNOSIS — M79671 Pain in right foot: Secondary | ICD-10-CM | POA: Diagnosis not present

## 2020-09-24 ENCOUNTER — Telehealth: Payer: Self-pay | Admitting: Neurology

## 2020-09-24 NOTE — Telephone Encounter (Signed)
Pt's daughter called in stating she has noticed some changes. About 3 weeks ago she has seen a large decline. The pt is using the wrong words for things, calling people by the wrong names, has been more irritated with things, forgets to change her diaper, and is getting family members mixed up.

## 2020-09-25 NOTE — Telephone Encounter (Signed)
This may be progression of her disease, but with sudden changes, would have to rule out underlying infection, pls have her contact PCP, may need to check for UTI. thanks

## 2020-09-25 NOTE — Telephone Encounter (Signed)
Spoke with pt daughter informed her This may be progression of her disease, but with sudden changes, would have to rule out underlying infection, pls have her contact PCP, may need to check for UTI, pt seen PCP on Friday but was not checked. She stated that she feels like she is being pushes to other dr. I explained to her at times pt with memory issues are pushed off as memory problems and it can be an under line issues going on. She stated that she will call her moms PCP and see if she can get her checked for a UTI she said she doesn't understand why they didn't check her Friday when she was seen in the office,

## 2020-09-27 DIAGNOSIS — R8290 Unspecified abnormal findings in urine: Secondary | ICD-10-CM | POA: Diagnosis not present

## 2020-09-27 DIAGNOSIS — L909 Atrophic disorder of skin, unspecified: Secondary | ICD-10-CM | POA: Diagnosis not present

## 2020-10-18 DIAGNOSIS — N390 Urinary tract infection, site not specified: Secondary | ICD-10-CM | POA: Diagnosis not present

## 2020-10-27 ENCOUNTER — Emergency Department (HOSPITAL_COMMUNITY): Payer: Medicare Other

## 2020-10-27 ENCOUNTER — Emergency Department (HOSPITAL_COMMUNITY)
Admission: EM | Admit: 2020-10-27 | Discharge: 2020-10-27 | Disposition: A | Discharge status: Home / Self Care | Payer: Medicare Other | Attending: Emergency Medicine | Admitting: Emergency Medicine

## 2020-10-27 ENCOUNTER — Encounter (HOSPITAL_COMMUNITY): Payer: Self-pay | Admitting: Emergency Medicine

## 2020-10-27 DIAGNOSIS — M25552 Pain in left hip: Secondary | ICD-10-CM | POA: Diagnosis not present

## 2020-10-27 DIAGNOSIS — Y9289 Other specified places as the place of occurrence of the external cause: Secondary | ICD-10-CM | POA: Insufficient documentation

## 2020-10-27 DIAGNOSIS — Z96651 Presence of right artificial knee joint: Secondary | ICD-10-CM | POA: Insufficient documentation

## 2020-10-27 DIAGNOSIS — F0390 Unspecified dementia without behavioral disturbance: Secondary | ICD-10-CM | POA: Diagnosis not present

## 2020-10-27 DIAGNOSIS — S0081XA Abrasion of other part of head, initial encounter: Secondary | ICD-10-CM | POA: Diagnosis not present

## 2020-10-27 DIAGNOSIS — Z91010 Allergy to peanuts: Secondary | ICD-10-CM | POA: Insufficient documentation

## 2020-10-27 DIAGNOSIS — Z79899 Other long term (current) drug therapy: Secondary | ICD-10-CM | POA: Insufficient documentation

## 2020-10-27 DIAGNOSIS — S199XXA Unspecified injury of neck, initial encounter: Secondary | ICD-10-CM | POA: Diagnosis not present

## 2020-10-27 DIAGNOSIS — Z043 Encounter for examination and observation following other accident: Secondary | ICD-10-CM | POA: Diagnosis not present

## 2020-10-27 DIAGNOSIS — S7002XA Contusion of left hip, initial encounter: Secondary | ICD-10-CM

## 2020-10-27 DIAGNOSIS — Z743 Need for continuous supervision: Secondary | ICD-10-CM | POA: Diagnosis not present

## 2020-10-27 DIAGNOSIS — I10 Essential (primary) hypertension: Secondary | ICD-10-CM | POA: Diagnosis not present

## 2020-10-27 DIAGNOSIS — S161XXA Strain of muscle, fascia and tendon at neck level, initial encounter: Secondary | ICD-10-CM | POA: Diagnosis not present

## 2020-10-27 DIAGNOSIS — S60222A Contusion of left hand, initial encounter: Secondary | ICD-10-CM | POA: Diagnosis not present

## 2020-10-27 DIAGNOSIS — Z87891 Personal history of nicotine dependence: Secondary | ICD-10-CM | POA: Diagnosis not present

## 2020-10-27 DIAGNOSIS — W010XXA Fall on same level from slipping, tripping and stumbling without subsequent striking against object, initial encounter: Secondary | ICD-10-CM | POA: Insufficient documentation

## 2020-10-27 DIAGNOSIS — S20212A Contusion of left front wall of thorax, initial encounter: Secondary | ICD-10-CM | POA: Diagnosis not present

## 2020-10-27 DIAGNOSIS — M19042 Primary osteoarthritis, left hand: Secondary | ICD-10-CM | POA: Diagnosis not present

## 2020-10-27 DIAGNOSIS — S0990XA Unspecified injury of head, initial encounter: Secondary | ICD-10-CM | POA: Diagnosis not present

## 2020-10-27 DIAGNOSIS — R55 Syncope and collapse: Secondary | ICD-10-CM | POA: Diagnosis not present

## 2020-10-27 DIAGNOSIS — W19XXXA Unspecified fall, initial encounter: Secondary | ICD-10-CM | POA: Diagnosis not present

## 2020-10-27 DIAGNOSIS — R0902 Hypoxemia: Secondary | ICD-10-CM | POA: Diagnosis not present

## 2020-10-27 DIAGNOSIS — R404 Transient alteration of awareness: Secondary | ICD-10-CM | POA: Diagnosis not present

## 2020-10-27 DIAGNOSIS — R0789 Other chest pain: Secondary | ICD-10-CM | POA: Diagnosis not present

## 2020-10-27 DIAGNOSIS — Z853 Personal history of malignant neoplasm of breast: Secondary | ICD-10-CM | POA: Insufficient documentation

## 2020-10-27 DIAGNOSIS — I517 Cardiomegaly: Secondary | ICD-10-CM | POA: Diagnosis not present

## 2020-10-27 LAB — COMPREHENSIVE METABOLIC PANEL
ALT: 11 U/L (ref 0–44)
AST: 23 U/L (ref 15–41)
Albumin: 3.5 g/dL (ref 3.5–5.0)
Alkaline Phosphatase: 68 U/L (ref 38–126)
Anion gap: 10 (ref 5–15)
BUN: 24 mg/dL — ABNORMAL HIGH (ref 8–23)
CO2: 21 mmol/L — ABNORMAL LOW (ref 22–32)
Calcium: 10 mg/dL (ref 8.9–10.3)
Chloride: 107 mmol/L (ref 98–111)
Creatinine, Ser: 1.34 mg/dL — ABNORMAL HIGH (ref 0.44–1.00)
GFR, Estimated: 38 mL/min — ABNORMAL LOW (ref >60)
Glucose, Bld: 166 mg/dL — ABNORMAL HIGH (ref 70–99)
Potassium: 3.7 mmol/L (ref 3.5–5.1)
Sodium: 138 mmol/L (ref 135–145)
Total Bilirubin: 0.7 mg/dL (ref 0.3–1.2)
Total Protein: 6.5 g/dL (ref 6.5–8.1)

## 2020-10-27 LAB — CBC WITH DIFFERENTIAL/PLATELET
Abs Immature Granulocytes: 0.04 10*3/uL (ref 0.00–0.07)
Basophils Absolute: 0.1 10*3/uL (ref 0.0–0.1)
Basophils Relative: 1 %
Eosinophils Absolute: 0.2 10*3/uL (ref 0.0–0.5)
Eosinophils Relative: 2 %
HCT: 39.5 % (ref 36.0–46.0)
Hemoglobin: 12.5 g/dL (ref 12.0–15.0)
Immature Granulocytes: 1 %
Lymphocytes Relative: 15 %
Lymphs Abs: 1.3 10*3/uL (ref 0.7–4.0)
MCH: 30 pg (ref 26.0–34.0)
MCHC: 31.6 g/dL (ref 30.0–36.0)
MCV: 95 fL (ref 80.0–100.0)
Monocytes Absolute: 0.5 10*3/uL (ref 0.1–1.0)
Monocytes Relative: 6 %
Neutro Abs: 6.4 10*3/uL (ref 1.7–7.7)
Neutrophils Relative %: 75 %
Platelets: 206 10*3/uL (ref 150–400)
RBC: 4.16 MIL/uL (ref 3.87–5.11)
RDW: 14.5 % (ref 11.5–15.5)
WBC: 8.5 10*3/uL (ref 4.0–10.5)
nRBC: 0 % (ref 0.0–0.2)

## 2020-10-27 MED ORDER — SODIUM CHLORIDE 0.9 % IV BOLUS
500.0000 mL | Freq: Once | INTRAVENOUS | Status: AC
  Administered 2020-10-27: 500 mL via INTRAVENOUS

## 2020-10-27 NOTE — ED Triage Notes (Addendum)
Pt bib ems after unwitnessed fall onto tile floor. Unknown LOC. Family reports pts eyes were rolling back into her head when they found her on the ground immediately after the fall. Family reports pt is not at mental status baseline. EMS reports spinal tenderness. Pt and family denies blood thinners. Pt A&Ox3 on arrival - not oriented to time. C collar in place on arrival.   BP: 124/64  P: 86  NSR  CBG: 127

## 2020-10-27 NOTE — ED Notes (Signed)
Patient transported to X-ray 

## 2020-10-27 NOTE — ED Notes (Signed)
Pt ambulated with walker without difficulty.

## 2020-10-27 NOTE — ED Notes (Signed)
Pt back from scans

## 2020-10-27 NOTE — Discharge Instructions (Addendum)
Rest.  Follow-up with primary doctor if symptoms are not improving in the next week, and return to the ER if symptoms significantly worsen or change.

## 2020-10-27 NOTE — ED Provider Notes (Signed)
[facility] EMERGENCY DEPARTMENT Provider Note   CSN: [id number] Arrival date & time: 10/27/20  0153     History Chief Complaint  Patient presents with   Fall   Altered Mental Status    [name] is a 87 y.o. female.  Patient is an 87 year old female with past medical history of dementia, hypertension, arthritis.  Patient presenting today for evaluation of fall.  According to the daughter, she heard a "thud", then went to check on her mother and found her on the floor.  She apparently tripped over her walker and landed on the ground.  Daughter states that she called 65 who arrived several minutes later.  During this time, the daughter tells me patient became less responsive with her eyes rolling back in her head.  She did respond after several minutes of encouragement with loud voice and stimulation.  There was no reported seizure activity.  Patient adds little history secondary to dementia.  Most of the history taken from the daughter who was present at bedside and was the witness to the event.  The history is provided by the patient and a relative (Daughter).  Fall This is a new problem. The current episode started less than 1 hour ago. The problem occurs constantly. The problem has not changed since onset.Nothing aggravates the symptoms. Nothing relieves the symptoms. She has tried nothing for the symptoms.      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 unsure why this is noted in her hx.     Normal cardiac stress test 2000   done at [facility]   Thyroid nodule    Thyroid nodule 2007   biopsy- benign    Patient Active Problem List   Diagnosis Date Noted   Acute midline low back pain with  left-sided sciatica 01/05/2018   Cervical myelopathy ([place]) 08/15/2016   S/P total knee replacement 07/09/2015   Chronic pain of both knees 11/09/2014   Breast cancer of upper-outer quadrant of left female breast ([place]) 03/02/2013   Bilateral wrist pain 10/29/2012   Generalized osteoarthritis of multiple sites 05/25/2012   Knee osteoarthritis 05/25/2012    Past Surgical History:  Procedure Laterality Date   ADENOIDECTOMY     BREAST BIOPSY  1974   right   BREAST BIOPSY  1977   left   BREAST BIOPSY  2012   Low grade ductal carcinoma ( needle loc Dr Melanee Spry)   BREAST EXCISIONAL BIOPSY Right    BREAST LUMPECTOMY  01/22/2011   BREAST LUMPECTOMY  114/14/2012   Procedure: LUMPECTOMY;  Surgeon: Haywood Lasso, MD;  Location: [facility];  Service: General;  Laterality: Left;  Needle localization  @ [facility]  12:30   BREAST LUMPECTOMY W/ NEEDLE LOCALIZATION  01/22/2011   Left - Dr Margot Chimes   COLONOSCOPY  2009   EYE SURGERY Left    laser for glaucoma   TONSILLECTOMY  1944   TOTAL KNEE ARTHROPLASTY Right 07/09/2015   Procedure: RIGHT TOTAL KNEE ARTHROPLASTY;  Surgeon: Vickey Huger, MD;  Location: [facility];  Service: Orthopedics;  Laterality: Right;   VAGINAL HYSTERECTOMY     partial  OB History   No obstetric history on file.     Family History  Problem Relation Age of Onset   Cancer Maternal Grandmother 21       breast   Breast cancer Maternal Grandmother    Cancer Paternal Aunt    Cancer Paternal Uncle 30       colon    Social History   Tobacco Use   Smoking status: Former    Types: Cigarettes    Quit date: 03/10/1981    Years since quitting: 39.6   Smokeless tobacco: Never  Vaping Use   Vaping Use: Never used  Substance Use Topics   Alcohol use: No   Drug use: No    Home Medications Prior to Admission medications   Medication Sig Start Date End Date Taking? Authorizing Provider  cholecalciferol (VITAMIN D) 1000 units tablet Take 1,000 Units by mouth daily.    [provider]  escitalopram (LEXAPRO) 20 MG tablet Take 1 tablet (20 mg total) by mouth at bedtime. 06/12/20   Cameron Sprang, MD  memantine [facility]) 10 MG tablet Take 1 tablet daily 06/12/20   Cameron Sprang, MD  metFORMIN (GLUCOPHAGE-XR) 500 MG 24 hr tablet Take 500 mg by mouth daily after supper.  10/07/12   [provider]  Multiple Vitamins-Minerals (MULTIVITAMIN WITH MINERALS) tablet Take 1 tablet by mouth daily.    [provider]  simvastatin (ZOCOR) 20 MG tablet Take 20 mg by mouth daily after supper.     [provider]  TRAVATAN Z 0.004 % SOLN ophthalmic solution INSTILL 1 DROP INTO BOTH EYES DAILY AT BEDTIME 08/17/19   [provider]    Allergies    Peanut-containing drug products, Penicillins, Shellfish allergy, and Tape  Review of Systems   Review of Systems  Unable to perform ROS: Dementia   Physical Exam Updated Vital Signs BP 113/86   Pulse 87   Temp (!) 97.4 F (36.3 C)   Resp 20   Ht '5\' 3"'$  (1.6 m)   Wt 89.2 kg   SpO2 97%   BMI 34.84 kg/m   Physical Exam Vitals and nursing note reviewed.  Constitutional:      General: She is not in acute distress.    Appearance: She is well-developed. She is not diaphoretic.  HENT:     Head: Normocephalic and atraumatic.     Comments: There are slight abrasions to the left forehead above the eyebrow, but no deformity or significant swelling or ecchymosis. Eyes:     Extraocular Movements: Extraocular movements intact.     Pupils: Pupils are equal, round, and reactive to light.  Cardiovascular:     Rate and Rhythm: Normal rate and regular rhythm.     Heart sounds: No murmur heard.   No friction rub. No gallop.  Pulmonary:     Effort: Pulmonary effort is normal. No respiratory distress.     Breath sounds: Normal breath sounds. No wheezing.  Abdominal:     General: Bowel sounds are normal. There is no distension.     Palpations: Abdomen is soft.     Tenderness: There is no abdominal  tenderness.  Musculoskeletal:        General: Normal range of motion.     Cervical back: Normal range of motion and neck supple.  Skin:    General: Skin is warm and dry.  Neurological:     General: No focal deficit present.     Mental Status: She is alert  and oriented to person, place, and time. Mental status is at baseline.     Cranial Nerves: No cranial nerve deficit.     Motor: No weakness.     Coordination: Coordination normal.    ED Results / Procedures / Treatments   Labs (all labs ordered are listed, but only abnormal results are displayed) Labs Reviewed - No data to display  EKG EKG Interpretation  Date/Time:  Saturday October 27 2020 02:07:59 EDT Ventricular Rate:  89 PR Interval:  131 QRS Duration: 89 QT Interval:  369 QTC Calculation: 449 R Axis:   69 Text Interpretation: Sinus rhythm Low voltage, precordial leads Nonspecific T abnormalities, lateral leads No significant change since 07/05/2015 Confirmed by Veryl Speak [phone]) on 10/27/2020 2:31:30 AM  Radiology No results found.  Procedures Procedures   Medications Ordered in ED Medications  sodium chloride 0.9 % bolus 500 mL (has no administration in time range)    ED Course  I have reviewed the triage vital signs and the nursing notes.  Pertinent labs & imaging results that were available during my care of the patient were reviewed by me and considered in my medical decision making (see chart for details).    MDM Rules/Calculators/A&P  Patient presenting here with complaints of fall, the details of which are described in the HPI.  Patient arrives here with stable vital signs and at her neurologic baseline.  Patient underwent imaging studies including CT scan of the head, cervical spine as well as x-rays of the left ribs, left hand, and left hip.  These were all unremarkable for fracture.  Patient has stood and ambulated in the hall without difficulty using a walker.  Patient has a walker at home and  I feel as though discharge is appropriate.  Patient to be discharged with as needed return.  Final Clinical Impression(s) / ED Diagnoses Final diagnoses:  None    Rx / DC Orders ED Discharge Orders     None        Veryl Speak, MD 10/27/20 [phone]

## 2020-11-15 ENCOUNTER — Telehealth: Payer: Self-pay

## 2020-11-15 DIAGNOSIS — R262 Difficulty in walking, not elsewhere classified: Secondary | ICD-10-CM | POA: Diagnosis not present

## 2020-11-15 DIAGNOSIS — Z23 Encounter for immunization: Secondary | ICD-10-CM | POA: Diagnosis not present

## 2020-11-15 DIAGNOSIS — E1122 Type 2 diabetes mellitus with diabetic chronic kidney disease: Secondary | ICD-10-CM | POA: Diagnosis not present

## 2020-11-15 DIAGNOSIS — R0600 Dyspnea, unspecified: Secondary | ICD-10-CM | POA: Diagnosis not present

## 2020-11-15 DIAGNOSIS — N182 Chronic kidney disease, stage 2 (mild): Secondary | ICD-10-CM | POA: Diagnosis not present

## 2020-11-15 DIAGNOSIS — R410 Disorientation, unspecified: Secondary | ICD-10-CM | POA: Diagnosis not present

## 2020-11-15 DIAGNOSIS — M79671 Pain in right foot: Secondary | ICD-10-CM | POA: Diagnosis not present

## 2020-11-15 NOTE — Telephone Encounter (Signed)
(  2:01 pm) SW scheduled initial palliative care visit with patient for 11/20/20 @ 2 pm.

## 2020-11-19 DIAGNOSIS — M79671 Pain in right foot: Secondary | ICD-10-CM | POA: Diagnosis not present

## 2020-11-20 ENCOUNTER — Other Ambulatory Visit: Payer: Medicare Other

## 2020-11-20 ENCOUNTER — Other Ambulatory Visit: Payer: Medicare Other | Admitting: *Deleted

## 2020-11-20 ENCOUNTER — Other Ambulatory Visit: Payer: Self-pay

## 2020-11-20 DIAGNOSIS — Z515 Encounter for palliative care: Secondary | ICD-10-CM

## 2020-11-20 NOTE — Progress Notes (Signed)
[facility] PALLIATIVE CARE RN NOTE  PATIENT NAME: [name] DOB: [birth year] MRN: [id number]  PRIMARY CARE PROVIDER: Kelton Pillar, MD  RESPONSIBLE PARTY:  Acct ID - Guarantor Home Phone Work Phone Relationship Acct Type  [id number] - [name]  [phone]  Self P/F     [place], [place], [place] [address]   Covid-19 Pre-screening Negative  PLAN OF CARE and INTERVENTION:  ADVANCE CARE PLANNING/GOALS OF CARE: Goal is for patient to remain in her home.  PATIENT/CAREGIVER EDUCATION: Explained palliative care services, symptom management, safe mobility/transfers, fall prevention DISEASE STATUS: Joint initial palliative care visit completed with LCSW, M. Lonon. Met with patient, daughter [name], grandchildren and hired caregiver in the home. [name] provided recent patient history. Patient had a fall on 10/27/20 in the bathroom where she fell backwards, hit her head and eyes rolled to the back of her head. EMS was called and patient was responsive and talkative by the time they arrived. Scans were done and no fractures were noted. She was released from the ED back to her home. She has had 7 falls over the past 10 months. She is having more difficulty standing and now requires assistance. They are using a gait belt. She is progressively weaker. She is ambulatory using a walker. They took patient to an urgent care walk-in clinic due to a large black scab that came off of her foot causing her pain. They advised it was a broken blood vessel in her foot. It took them 10 minutes to get patient down the steps leading outside of the home to get her there. Daughter feels that patient has had a more rapid decline over the past few weeks. She could benefit from a hospital bed. Patient is intermittently confused, but able to answer simple questions. She doesn't like to get up in the mornings and is sleeping throughout much of the day. Without encouragement, patient will remain in bed all day. She  has a hired caregiver through [facility] who is present during the day to assist with personal care needs. She mainly takes sponge baths as she does not like getting into the shower despite having a shower chair and hand-held sprayer. Daughter states her appetite fair, but has started to decline. She is eating 1 good meal per day along with snacks periodically during the day. Her weight has been stable over the past few months. Current weight 190 lbs, which is down from her usual 220 lbs. Weight loss has occurred over the course of the past 4 years. She has a new symptom of bilateral hand tremors only noted when patient is still. They are not present with activity. She has healed pressure ulcers to her bottom. She states her heel is feeling better today. Daughter is applying Neosporin and covering it with a bandage. Family and patient are agreeable to future visits with palliative care. Will continue to monitor.  HISTORY OF PRESENT ILLNESS:  This is a 87 yo female with a history of dementia, hypertension and arthritis. Palliative care team has been asked to follow patient for symptom management, goals of care and complex decision making.   CODE STATUS: Full code ADVANCED DIRECTIVES: Y MOST FORM: no PPS: 50%   (Duration of visit and documentation 75 minutes)   Daryl Eastern, RN BSN

## 2020-11-20 NOTE — Progress Notes (Signed)
[facility] SW NOTE  PATIENT NAME: [name] DOB: 31-Mar-1931 MRN: [id number]  PRIMARY CARE PROVIDER: Kelton Pillar, MD  RESPONSIBLE PARTY:  Acct ID - Guarantor Home Phone Work Phone Relationship Acct Type  [id number] - Commisso,Evony E [phone]  Self P/F     [place], [place], [place] [address]     PLAN OF CARE and INTERVENTIONS:             GOALS OF CARE/ ADVANCE CARE PLANNING:  Goal is for patient to remain at home. Patient code status to be further explored.  SOCIAL/EMOTIONAL/SPIRITUAL ASSESSMENT/ INTERVENTIONS:  SW and RN-M. Nadara Mustard completed the initial palliative care visit with patient at her home. The team met with patient's daughter-[name] and granddaughter-Mariana initially to discuss palliative care services/program, eligibility criteria, how and when to contact the palliative care team. [name] provided verbal consent to services, that was later verbalized by the patient. [name] provided a status update on patient. She advised that she has noticed a decline in patient since her last fall on 10/27/20. Patient's cognitive deficits have increased according to daughter. Also since that fall, patient is sleeping longer in the mornings and most of the day. [name] report that patient is weaker, where it is taking 2 people to help her stand up to her walker. Her appetite is fair, but declined overall where she is eating at least 50% of 1 meal a day with snacks. She has maintained her weight of 190 lbs, but that is down from patient's normal 220 lbs. Patient ambulates with a walker. Patient has had 7 falls in last 10 months according to her daughter. Patient has some wounds on her heels and the bottom of her foot that was assessed by a walk-in clinic. Patient was diagnosed with hand a broken blood vessel in her foot. The family is keeping the area clean and wrapped and that seems to be helping patient. [name] also described that for the past 3/4 months she has observed  involuntary tremors in patient's hand. Patient has caregiver through [facility] that assist with patient personal care. Although patient has a shower share, she prefers bathing at the sink. SW and RN was able to visit with patient in her bedroom. She was sitting on the side of her bed, awake and alert and engaged. She was responsive to simple questions. She denied pain. She expressed her openness to ongoing palliative care services/support. Patient's daughter expressed a need for assistance with obtaining a hospital bed for patient-SW to try to facilitate this need.  PATIENT/CAREGIVER EDUCATION/ COPING:  Patient appears to be coping well. PCG is having some health issues of her own. She will be having surgery this week and she is concern about patient's care while she is in the hospital. SW provided resources for in-home respite for patient which she can seek through [facility] whom is already provided caregivers for patient.  PERSONAL EMERGENCY PLAN:  911 can be activated.  COMMUNITY RESOURCES COORDINATION/ HEALTH CARE NAVIGATION:  Private caregivers through [facility]. FINANCIAL/LEGAL CONCERNS/INTERVENTIONS:  None.     SOCIAL HX:  Social History   Tobacco Use   Smoking status: Former    Types: Cigarettes    Quit date: 03/10/1981    Years since quitting: 39.7   Smokeless tobacco: Never  Substance Use Topics   Alcohol use: No    CODE STATUS: To be further assessed ADVANCED DIRECTIVES: To be further assessed MOST FORM COMPLETE:  No HOSPICE EDUCATION PROVIDED: No  PPS: Patient is alert and  oriented to self and situation, but has some cognitive deficits.  Duration of visit and documentation: 60 minutes.   [address] [place], [place]

## 2020-11-23 DIAGNOSIS — R0609 Other forms of dyspnea: Secondary | ICD-10-CM | POA: Diagnosis not present

## 2020-11-23 DIAGNOSIS — R0600 Dyspnea, unspecified: Secondary | ICD-10-CM | POA: Diagnosis not present

## 2020-11-27 ENCOUNTER — Ambulatory Visit
Admission: RE | Admit: 2020-11-27 | Discharge: 2020-11-27 | Disposition: A | Discharge status: Home / Self Care | Payer: Medicare Other | Source: Ambulatory Visit | Attending: Family Medicine | Admitting: Family Medicine

## 2020-11-27 ENCOUNTER — Other Ambulatory Visit: Payer: Self-pay | Admitting: Family Medicine

## 2020-11-27 DIAGNOSIS — M79671 Pain in right foot: Secondary | ICD-10-CM | POA: Diagnosis not present

## 2020-11-27 DIAGNOSIS — M7989 Other specified soft tissue disorders: Secondary | ICD-10-CM | POA: Diagnosis not present

## 2020-12-12 DIAGNOSIS — R234 Changes in skin texture: Secondary | ICD-10-CM | POA: Diagnosis not present

## 2020-12-12 DIAGNOSIS — M25522 Pain in left elbow: Secondary | ICD-10-CM | POA: Diagnosis not present

## 2020-12-12 DIAGNOSIS — M79671 Pain in right foot: Secondary | ICD-10-CM | POA: Diagnosis not present

## 2020-12-18 ENCOUNTER — Telehealth: Payer: Self-pay

## 2020-12-18 NOTE — Telephone Encounter (Signed)
(  4:05 pm) SW scheduled a follow-up visit with patient at her daughter. RN/SW visit scheduled for 01/16/21 @ 2pm.

## 2020-12-25 ENCOUNTER — Other Ambulatory Visit: Payer: Self-pay

## 2020-12-25 ENCOUNTER — Encounter: Payer: Medicare Other | Attending: Physician Assistant | Admitting: Physician Assistant

## 2020-12-25 DIAGNOSIS — F0150 Vascular dementia without behavioral disturbance: Secondary | ICD-10-CM | POA: Diagnosis not present

## 2020-12-25 DIAGNOSIS — E119 Type 2 diabetes mellitus without complications: Secondary | ICD-10-CM | POA: Insufficient documentation

## 2020-12-25 DIAGNOSIS — L89613 Pressure ulcer of right heel, stage 3: Secondary | ICD-10-CM | POA: Insufficient documentation

## 2020-12-25 DIAGNOSIS — L89619 Pressure ulcer of right heel, unspecified stage: Secondary | ICD-10-CM | POA: Diagnosis present

## 2020-12-25 NOTE — Progress Notes (Signed)
Marro, [name] [name] ([id number]) Visit Report for 12/25/2020 Abuse/Suicide Risk Screen Details Patient Name: [name] [name]. Date of Service: 12/25/2020 12:45 PM Medical Record Number: [id number] Patient Account Number: [id number] Date of Birth/Sex: 08-Feb-1932 (87 y.o. Female) Treating RN: Dolan Amen Primary Care Guenther Dunshee: Kelton Pillar Other Clinician: Referring Rorik Vespa: Kelton Pillar Treating Finn Altemose/Extender: Skipper Cliche in Treatment: 0 Abuse/Suicide Risk Screen Items Answer ABUSE RISK SCREEN: Has anyone close to you tried to hurt or harm you recentlyo No Do you feel uncomfortable with anyone in your familyo No Has anyone forced you do things that you didnot want to doo No Electronic Signature(s) Signed: 12/25/2020 4:32:38 PM By: Dolan Amen RN Entered By: Dolan Amen on 12/25/2020 13:16:13 [name] [name] ([id number]) -------------------------------------------------------------------------------- Activities of Daily Living Details Patient Name: [name] [name] Date of Service: 12/25/2020 12:45 PM Medical Record Number: [id number] Patient Account Number: [id number] Date of Birth/Sex: [birth year] (87 y.o. Female) Treating RN: Dolan Amen Primary Care Alando Colleran: Kelton Pillar Other Clinician: Referring Jarone Ostergaard: Kelton Pillar Treating Gal Smolinski/Extender: Skipper Cliche in Treatment: 0 Activities of Daily Living Items Answer Activities of Daily Living (Please select one for each item) Drive Automobile Not Able Take Medications Not Able Use Telephone Not Able Care for Appearance Not Able Use Toilet Need Assistance Bath / Shower Need Assistance Dress Self Need Assistance Feed Self Need Assistance Walk Not Able Get In / Out Bed Not Able Housework Not Able Prepare Meals Not Able Handle Money Not Able Shop for Self Not Able Electronic Signature(s) Signed: 12/25/2020 4:32:38 PM By: Dolan Amen RN Entered By: Dolan Amen on 12/25/2020 13:17:00 [name] [name] ([id number]) -------------------------------------------------------------------------------- Education Screening Details Patient Name: [name] [name] Date of Service: 12/25/2020 12:45 PM Medical Record Number: [id number] Patient Account Number: [id number] Date of Birth/Sex: Jul 25, 1931 (87 y.o. Female) Treating RN: Dolan Amen Primary Care Laynee Lockamy: Kelton Pillar Other Clinician: Referring Vertie Dibbern: Kelton Pillar Treating Waleska Buttery/Extender: Skipper Cliche in Treatment: 0 Primary Learner Assessed: Caregiver daughter Reason Patient is not Primary Learner: dementia Learning Preferences/Education Level/Primary Language Preferred Language: Financial planner) Signed: 12/25/2020 4:32:38 PM By: Dolan Amen RN Entered By: Dolan Amen on 12/25/2020 13:17:19 [name] [name] ([id number]) -------------------------------------------------------------------------------- Fall Risk Assessment Details Patient Name: [name] [name] Date of Service: 12/25/2020 12:45 PM Medical Record Number: [id number] Patient Account Number: [id number] Date of Birth/Sex: 26-Nov-1931 (87 y.o. Female) Treating RN: Dolan Amen Primary Care Wally Behan: Kelton Pillar Other Clinician: Referring Nayeliz Hipp: Kelton Pillar Treating Sina Lucchesi/Extender: Skipper Cliche in Treatment: 0 Fall Risk Assessment Items Have you had 2 or more falls in the last 12 monthso 0 Yes Have you had any fall that resulted in injury in the last 12 monthso 0 Yes FALLS RISK SCREEN History of falling - immediate or within 3 months 25 Yes Secondary diagnosis (Do you have 2 or more medical diagnoseso) 15 Yes Ambulatory aid None/bed rest/wheelchair/nurse 0 Yes Crutches/cane/walker 0 No Furniture 0 No Intravenous therapy Access/Saline/Heparin Lock 0 No Gait/Transferring Normal/ bed rest/ wheelchair 0 Yes Weak (short steps with or without shuffle, stooped but able to lift head while walking, may 10 Yes seek support from  furniture) Impaired (short steps with shuffle, may have difficulty arising from chair, head down, impaired 0 No balance) Mental Status Oriented to own ability 0 No Electronic Signature(s) Signed: 12/25/2020 4:32:38 PM By: Dolan Amen RN Entered By: Dolan Amen on 12/25/2020 13:17:34 [name] [name] ([id number]) -------------------------------------------------------------------------------- Foot Assessment Details Patient Name: [name] [name] Date of Service: 12/25/2020 12:45 PM Medical Record Number: [id number] Patient  Account Number: [id number] Date of Birth/Sex: [birth year] (87 y.o. Female) Treating RN: Dolan Amen Primary Care Theo Reither: Kelton Pillar Other Clinician: Referring Cynda Soule: Kelton Pillar Treating Dorcas Melito/Extender: Skipper Cliche in Treatment: 0 Foot Assessment Items [x]  Unable to perform due to altered mental status Site Locations + = Sensation present, - = Sensation absent, C = Callus, U = Ulcer R = Redness, W = Warmth, M = Maceration, PU = Pre-ulcerative lesion F = Fissure, S = Swelling, D = Dryness Assessment Right: Left: Other Deformity: No No Prior Foot Ulcer: No No Prior Amputation: No No Charcot Joint: No No Ambulatory Status: Non-ambulatory Assistance Device: Wheelchair Gait: Administrator, arts) Signed: 12/25/2020 4:32:38 PM By: Dolan Amen RN Entered By: Dolan Amen on 12/25/2020 13:18:17 [name] [name] ([id number]) -------------------------------------------------------------------------------- Nutrition Risk Screening Details Patient Name: [name] [name] Date of Service: 12/25/2020 12:45 PM Medical Record Number: [id number] Patient Account Number: [id number] Date of Birth/Sex: June 17, 1931 (87 y.o. Female) Treating RN: Dolan Amen Primary Care Ajene Carchi: Kelton Pillar Other Clinician: Referring Hoover Grewe: Kelton Pillar Treating Tarl Cephas/Extender: Jeri Cos Weeks in Treatment: 0 Height (in): Weight (lbs): Body  Mass Index (BMI): Nutrition Risk Screening Items Score Screening NUTRITION RISK SCREEN: I have an illness or condition that made me change the kind and/or amount of food I eat 2 Yes I eat fewer than two meals per day 0 No I eat few fruits and vegetables, or milk products 0 No I have three or more drinks of beer, liquor or wine almost every day 0 No I have tooth or mouth problems that make it hard for me to eat 0 No I don't always have enough money to buy the food I need 0 No I eat alone most of the time 0 No I take three or more different prescribed or over-the-counter drugs a day 1 Yes Without wanting to, I have lost or gained 10 pounds in the last six months 0 No I am not always physically able to shop, cook and/or feed myself 2 Yes Nutrition Protocols Good Risk Protocol Moderate Risk Protocol 0 Provide education on nutrition High Risk Proctocol Risk Level: Moderate Risk Score: 5 Electronic Signature(s) Signed: 12/25/2020 4:32:38 PM By: Dolan Amen RN Entered By: Dolan Amen on 12/25/2020 13:18:00

## 2020-12-25 NOTE — Progress Notes (Signed)
[name], Kimberly ZEMANEK ([id number]) Visit Report for 12/25/2020 Chief Complaint Document Details Patient Name: [name]. Date of Service: 12/25/2020 12:45 PM Medical Record Number: [id number] Patient Account Number: [id number] Date of Birth/Sex: [birth year] (87 y.o. Female) Treating RN: Dolan Amen Primary Care Provider: Kelton Pillar Other Clinician: Referring Provider: Kelton Pillar Treating Provider/Extender: Jeri Cos Weeks in Treatment: 0 Information Obtained from: Patient Chief Complaint Right heel pressure ulcer Electronic Signature(s) Signed: 12/25/2020 1:48:29 PM By: Worthy Keeler PA-C Entered By: Worthy Keeler on 12/25/2020 13:48:29 [name] ([id number]) -------------------------------------------------------------------------------- Debridement Details Patient Name: [name] Date of Service: 12/25/2020 12:45 PM Medical Record Number: [id number] Patient Account Number: [id number] Date of Birth/Sex: 11-Jul-1931 (87 y.o. Female) Treating RN: Dolan Amen Primary Care Provider: Kelton Pillar Other Clinician: Referring Provider: Kelton Pillar Treating Provider/Extender: Skipper Cliche in Treatment: 0 Debridement Performed for Wound #1 Right Calcaneus Assessment: Performed By: Physician Tommie Sams., PA-C Debridement Type: Debridement Level of Consciousness (Pre- Awake and Alert procedure): Pre-procedure Verification/Time Out Yes - 13:52 Taken: Start Time: 13:52 Total Area Debrided (L x W): 4 (cm) x 3 (cm) = 12 (cm) Tissue and other material Viable, Non-Viable, Eschar, Slough, Subcutaneous, Skin: Epidermis, Slough debrided: Level: Skin/Subcutaneous Tissue Debridement Description: Excisional Instrument: Curette Bleeding: Minimum Hemostasis Achieved: Pressure Response to Treatment: Procedure was tolerated well Level of Consciousness (Post- Awake and Alert procedure): Post Debridement Measurements of Total Wound Length: (cm) 0.4 Stage:  Category/Stage III Width: (cm) 0.8 Depth: (cm) 0.3 Volume: (cm) 0.075 Character of Wound/Ulcer Post Debridement: Stable Post Procedure Diagnosis Same as Pre-procedure Electronic Signature(s) Signed: 12/25/2020 4:32:38 PM By: Dolan Amen RN Signed: 12/25/2020 4:43:23 PM By: Worthy Keeler PA-C Entered By: Dolan Amen on 12/25/2020 13:56:52 Kimberly, [name] ([id number]) -------------------------------------------------------------------------------- HPI Details Patient Name: [name] Date of Service: 12/25/2020 12:45 PM Medical Record Number: [id number] Patient Account Number: [id number] Date of Birth/Sex: Aug 11, 1931 (87 y.o. Female) Treating RN: Dolan Amen Primary Care Provider: Kelton Pillar Other Clinician: Referring Provider: Kelton Pillar Treating Provider/Extender: Skipper Cliche in Treatment: 0 History of Present Illness HPI Description: 12/25/20 upon evaluation today patient presents for a wound on her heel which has been present for about 1 and half months or so according to her daughter. The patient does have dementia. With that being said her daughter is the primary caregiver and seems to be doing an excellent job. She had a fairly significant wound on the heel with eschar covering however this was lifting up and it appears to be mostly healed underneath the biggest issue is on the posterior aspect of the heel where she has a small slit of an opening in the Achilles region which is actually what is mainly painful for her today. We do not have a hemoglobin A1c as the patient actually is seen by a provider outside of the [facility] system so I am not able to look this up. With that being said I do not see any evidence of active infection and I do think that she is actually doing quite well I think her daughter is doing a great job taking care of the wound in particular. With regard to her past medical history she does have dementia and diabetes mellitus type 2 but  otherwise no major medical problems. She did have an x-ray performed 11/27/2020 which was negative for any signs of osteomyelitis. Electronic Signature(s) Signed: 12/25/2020 2:23:26 PM By: Worthy Keeler PA-C Entered By: Worthy Keeler on 12/25/2020 14:23:26 [name] ([id number]) --------------------------------------------------------------------------------  Physical Exam Details Patient Name: [name]. Date of Service: 12/25/2020 12:45 PM Medical Record Number: [id number] Patient Account Number: [id number] Date of Birth/Sex: 07-Mar-1932 (87 y.o. Female) Treating RN: Dolan Amen Primary Care Provider: Kelton Pillar Other Clinician: Referring Provider: Kelton Pillar Treating Provider/Extender: Skipper Cliche in Treatment: 0 Constitutional patient is hypertensive.. pulse regular and within target range for patient.[name] respirations regular, non-labored and within target range for patient.[name] temperature within target range for patient.. Well-nourished and well-hydrated in no acute distress. Eyes conjunctiva clear no eyelid edema noted. pupils equal round and reactive to light and accommodation. Ears, Nose, Mouth, and Throat no gross abnormality of ear auricles or external auditory canals. normal hearing noted during conversation. mucus membranes moist. Respiratory normal breathing without difficulty. Cardiovascular 2+ dorsalis pedis/posterior tibialis pulses. no clubbing, cyanosis, significant edema, <3 sec cap refill. Musculoskeletal Patient unable to walk without assistance. no significant deformity or arthritic changes, no loss or range of motion, no clubbing. Psychiatric this patient is able to make decisions and demonstrates good insight into disease process. Alert and Oriented x 3. pleasant and cooperative. Notes Upon inspection patient's wound bed actually showed signs of good granulation epithelization at this point. Fortunately there is some good healing underneath  the eschar and most of this was actually not as big as it initially appeared. The wound that actually is open on the posterior aspect of the heel and the Achilles region is doing decently well with regard to the appearance overall although I did have to perform some debridement to clear away some of the necrotic debris including callus around the edges of the wound. She tolerated that today without complication. Post debridement this was significantly improved. Electronic Signature(s) Signed: 12/25/2020 2:24:16 PM By: Worthy Keeler PA-C Entered By: Worthy Keeler on 12/25/2020 14:24:15 [name] ([id number]) -------------------------------------------------------------------------------- Physician Orders Details Patient Name: [name] Date of Service: 12/25/2020 12:45 PM Medical Record Number: [id number] Patient Account Number: [id number] Date of Birth/Sex: [birth year] (87 y.o. Female) Treating RN: Dolan Amen Primary Care Provider: Kelton Pillar Other Clinician: Referring Provider: Kelton Pillar Treating Provider/Extender: Skipper Cliche in Treatment: 0 Verbal / Phone Orders: No Diagnosis Coding ICD-10 Coding Code Description [phone] Pressure ulcer of right heel, stage 3 E11.622 Type 2 diabetes mellitus with other skin ulcer F01.50 Vascular dementia without behavioral disturbance Follow-up Appointments o Return Appointment in 1 week. Bathing/ Shower/ Hygiene o May shower; gently cleanse wound with antibacterial soap, rinse and pat dry prior to dressing wounds Off-Loading o Other: - Patient to purchase slippers with rubber soles Wound Treatment Wound #1 - Calcaneus Wound Laterality: Right Cleanser: Byram Ancillary Kit - 15 Day Supply (DME) (Generic) 3 x Per Week/30 Days Discharge Instructions: Use supplies as instructed; Kit contains: (15) Saline Bullets; (15) 3x3 Gauze; 15 pr Gloves Cleanser: Normal Saline 3 x Per Week/30 Days Discharge Instructions: Wash  your hands with soap and water. Remove old dressing, discard into plastic bag and place into trash. Cleanse the wound with Normal Saline prior to applying a clean dressing using gauze sponges, not tissues or cotton balls. Do not scrub or use excessive force. Pat dry using gauze sponges, not tissue or cotton balls. Primary Dressing: Prisma 4.34 (in) 3 x Per Week/30 Days Discharge Instructions: Moisten w/normal saline or sterile water; Cover wound as directed. Do not remove from wound bed. Primary Dressing: Zetuvit Plus Silicone Border Dressing 5x5 (in/in) (DME) (Generic) 3 x Per Week/30 Days Discharge Instructions: Secure the collagen Electronic Signature(s) Signed: 12/25/2020  4:32:38 PM By: Dolan Amen RN Signed: 12/25/2020 4:43:23 PM By: Worthy Keeler PA-C Entered By: Dolan Amen on 12/25/2020 14:11:32 [name] ([id number]) -------------------------------------------------------------------------------- Problem List Details Patient Name: [name] Date of Service: 12/25/2020 12:45 PM Medical Record Number: [id number] Patient Account Number: [id number] Date of Birth/Sex: Nov 02, 1931 (87 y.o. Female) Treating RN: Dolan Amen Primary Care Provider: Kelton Pillar Other Clinician: Referring Provider: Kelton Pillar Treating Provider/Extender: Jeri Cos Weeks in Treatment: 0 Active Problems ICD-10 Encounter Code Description Active Date MDM Diagnosis L89.613 Pressure ulcer of right heel, stage 3 12/25/2020 No Yes E11.622 Type 2 diabetes mellitus with other skin ulcer 12/25/2020 No Yes F01.50 Vascular dementia without behavioral disturbance 12/25/2020 No Yes Inactive Problems Resolved Problems Electronic Signature(s) Signed: 12/25/2020 1:48:06 PM By: Worthy Keeler PA-C Entered By: Worthy Keeler on 12/25/2020 13:48:06 [name] ([id number]) -------------------------------------------------------------------------------- Progress Note Details Patient Name:  [name] Date of Service: 12/25/2020 12:45 PM Medical Record Number: [id number] Patient Account Number: [id number] Date of Birth/Sex: [birth year] (87 y.o. Female) Treating RN: Dolan Amen Primary Care Provider: Kelton Pillar Other Clinician: Referring Provider: Kelton Pillar Treating Provider/Extender: Skipper Cliche in Treatment: 0 Subjective Chief Complaint Information obtained from Patient Right heel pressure ulcer History of Present Illness (HPI) 12/25/20 upon evaluation today patient presents for a wound on her heel which has been present for about 1 and half months or so according to her daughter. The patient does have dementia. With that being said her daughter is the primary caregiver and seems to be doing an excellent job. She had a fairly significant wound on the heel with eschar covering however this was lifting up and it appears to be mostly healed underneath the biggest issue is on the posterior aspect of the heel where she has a small slit of an opening in the Achilles region which is actually what is mainly painful for her today. We do not have a hemoglobin A1c as the patient actually is seen by a provider outside of the [facility] system so I am not able to look this up. With that being said I do not see any evidence of active infection and I do think that she is actually doing quite well I think her daughter is doing a great job taking care of the wound in particular. With regard to her past medical history she does have dementia and diabetes mellitus type 2 but otherwise no major medical problems. She did have an x-ray performed 11/27/2020 which was negative for any signs of osteomyelitis. Patient History Unable to Obtain Patient History due to Dementia. Information obtained from Chart, Other: daughter. Allergies penicillin, shrimp, ACE Inhibitors Social History Former smoker. Medical History Cardiovascular Patient has history of Hypertension Endocrine Patient  has history of Type II Diabetes Musculoskeletal Patient has history of Osteoarthritis Neurologic Patient has history of Dementia Patient is treated with Oral Agents. Review of Systems (ROS) Oncologic breast cancer Objective Constitutional patient is hypertensive.. pulse regular and within target range for patient.[name] respirations regular, non-labored and within target range for patient.[name] temperature within target range for patient.. Well-nourished and well-hydrated in no acute distress. Vitals Time Taken: 1:05 PM, Temperature: 98.5 F, Pulse: 76 bpm, Respiratory Rate: 18 breaths/min, Blood Pressure: 147/84 mmHg. Eyes [name], [name] ([id number]) conjunctiva clear no eyelid edema noted. pupils equal round and reactive to light and accommodation. Ears, Nose, Mouth, and Throat no gross abnormality of ear auricles or external auditory canals. normal hearing noted during conversation. mucus membranes moist. Respiratory  normal breathing without difficulty. Cardiovascular 2+ dorsalis pedis/posterior tibialis pulses. no clubbing, cyanosis, significant edema, Musculoskeletal Patient unable to walk without assistance. no significant deformity or arthritic changes, no loss or range of motion, no clubbing. Psychiatric this patient is able to make decisions and demonstrates good insight into disease process. Alert and Oriented x 3. pleasant and cooperative. General Notes: Upon inspection patient's wound bed actually showed signs of good granulation epithelization at this point. Fortunately there is some good healing underneath the eschar and most of this was actually not as big as it initially appeared. The wound that actually is open on the posterior aspect of the heel and the Achilles region is doing decently well with regard to the appearance overall although I did have to perform some debridement to clear away some of the necrotic debris including callus around the edges of the wound. She tolerated  that today without complication. Post debridement this was significantly improved. Integumentary (Hair, Skin) Wound #1 status is Open. Original cause of wound was Pressure Injury. The date acquired was: 12/25/2020. The wound is located on the Right Calcaneus. The wound measures 0.4cm length x 0.8cm width x 0.2cm depth; 0.251cm^2 area and 0.05cm^3 volume. There is Fat Layer (Subcutaneous Tissue) exposed. There is no tunneling or undermining noted. There is a medium amount of serosanguineous drainage noted. There is medium (34-66%) red granulation within the wound bed. There is a medium (34-66%) amount of necrotic tissue within the wound bed including Adherent Slough. Assessment Active Problems ICD-10 Pressure ulcer of right heel, stage 3 Type 2 diabetes mellitus with other skin ulcer Vascular dementia without behavioral disturbance Procedures Wound #1 Pre-procedure diagnosis of Wound #1 is a Pressure Ulcer located on the Right Calcaneus . There was a Excisional Skin/Subcutaneous Tissue Debridement with a total area of 12 sq cm performed by Tommie Sams., PA-C. With the following instrument(s): Curette to remove Viable and Non-Viable tissue/material. Material removed includes Eschar, Subcutaneous Tissue, Slough, and Skin: Epidermis. A time out was conducted at 13:52, prior to the start of the procedure. A Minimum amount of bleeding was controlled with Pressure. The procedure was tolerated well. Post Debridement Measurements: 0.4cm length x 0.8cm width x 0.3cm depth; 0.075cm^3 volume. Post debridement Stage noted as Category/Stage III. Character of Wound/Ulcer Post Debridement is stable. Post procedure Diagnosis Wound #1: Same as Pre-Procedure Plan Follow-up Appointments: Return Appointment in 1 week. Bathing/ Shower/ Hygiene: May shower; gently cleanse wound with antibacterial soap, rinse and pat dry prior to dressing wounds Off-Loading: Other: - Patient to purchase slippers with rubber  soles WOUND #1: - Calcaneus Wound Laterality: Right Cleanser: Byram Ancillary Kit - 15 Day Supply (DME) (Generic) 3 x Per Week/30 Days Discharge Instructions: Use supplies as instructed; Kit contains: (15) Saline Bullets; (15) 3x3 Gauze; 15 pr Gloves [name], [name] ([id number]) Cleanser: Normal Saline 3 x Per Week/30 Days Discharge Instructions: Wash your hands with soap and water. Remove old dressing, discard into plastic bag and place into trash. Cleanse the wound with Normal Saline prior to applying a clean dressing using gauze sponges, not tissues or cotton balls. Do not scrub or use excessive force. Pat dry using gauze sponges, not tissue or cotton balls. Primary Dressing: Prisma 4.34 (in) 3 x Per Week/30 Days Discharge Instructions: Moisten w/normal saline or sterile water; Cover wound as directed. Do not remove from wound bed. Primary Dressing: Zetuvit Plus Silicone Border Dressing 5x5 (in/in) (DME) (Generic) 3 x Per Week/30 Days Discharge Instructions: Secure the collagen 1. Would recommend  currently that we going continue with the wound care measures as before with regard to offloading they have been doing a great job keeping her heel especially when she is in bed completely offloaded. 2. We also discussed options for shoes which I think would be helpful for her as well. 3. Also did recommend that we use silver collagen along with a Zetuvit border foam dressing to protect the heel area. We will see patient back for reevaluation in 1 week here in the clinic. If anything worsens or changes patient will contact our office for additional recommendations. Electronic Signature(s) Signed: 12/25/2020 2:24:51 PM By: Worthy Keeler PA-C Entered By: Worthy Keeler on 12/25/2020 14:24:51 [name] ([id number]) -------------------------------------------------------------------------------- ROS/PFSH Details Patient Name: [name] Date of Service: 12/25/2020 12:45 PM Medical Record  Number: [id number] Patient Account Number: [id number] Date of Birth/Sex: 14-Nov-1931 (87 y.o. Female) Treating RN: Dolan Amen Primary Care Provider: Kelton Pillar Other Clinician: Referring Provider: Kelton Pillar Treating Provider/Extender: Skipper Cliche in Treatment: 0 Unable to Obtain Patient History due to oo Dementia Information Obtained From Chart Other: daughter Cardiovascular Medical History: Positive for: Hypertension Endocrine Medical History: Positive for: Type II Diabetes Time with diabetes: 10 years Treated with: Oral agents Musculoskeletal Medical History: Positive for: Osteoarthritis Neurologic Medical History: Positive for: Dementia Oncologic Complaints and Symptoms: Review of System Notes: breast cancer Immunizations Pneumococcal Vaccine: Received Pneumococcal Vaccination: No Implantable Devices No devices added Family and Social History Former smoker Engineer, maintenance) Signed: 12/25/2020 4:32:38 PM By: Dolan Amen RN Signed: 12/25/2020 4:43:23 PM By: Worthy Keeler PA-C Entered By: Dolan Amen on 12/25/2020 13:16:08 [name] ([id number]) -------------------------------------------------------------------------------- [place] Details Patient Name: [name] Date of Service: 12/25/2020 Medical Record Number: [id number] Patient Account Number: [id number] Date of Birth/Sex: 1931/12/15 (87 y.o. Female) Treating RN: Dolan Amen Primary Care Provider: Kelton Pillar Other Clinician: Referring Provider: Kelton Pillar Treating Provider/Extender: Jeri Cos Weeks in Treatment: 0 Diagnosis Coding ICD-10 Codes Code Description [phone] Pressure ulcer of right heel, stage 3 E11.622 Type 2 diabetes mellitus with other skin ulcer F01.50 Vascular dementia without behavioral disturbance Facility Procedures CPT4 Code: 18403754 Description: [place] VISIT-LEV 3 EST PT Modifier: Quantity: 1 CPT4 Code:  [id number] Description: 40352 - DEB SUBQ TISSUE 20 SQ CM/< Modifier: Quantity: 1 CPT4 Code: Description: ICD-10 Diagnosis Description L89.613 Pressure ulcer of right heel, stage 3 Modifier: Quantity: Physician Procedures CPT4 Code: 4818590 Description: 93112 - WC PHYS LEVEL 4 - NEW PT Modifier: 25 Quantity: 1 CPT4 Code: Description: ICD-10 Diagnosis Description L89.613 Pressure ulcer of right heel, stage 3 E11.622 Type 2 diabetes mellitus with other skin ulcer F01.50 Vascular dementia without behavioral disturbance Modifier: Quantity: CPT4 Code: 1624469 Description: 11042 - WC PHYS SUBQ TISS 20 SQ CM Modifier: Quantity: 1 CPT4 Code: Description: ICD-10 Diagnosis Description L89.613 Pressure ulcer of right heel, stage 3 Modifier: Quantity: Electronic Signature(s) Signed: 12/25/2020 2:42:03 PM By: Dolan Amen RN Signed: 12/25/2020 4:43:23 PM By: Worthy Keeler PA-C Previous Signature: 12/25/2020 2:25:18 PM Version By: Worthy Keeler PA-C Entered By: Dolan Amen on 12/25/2020 14:42:03

## 2020-12-25 NOTE — Progress Notes (Signed)
[name] ([id number]) Visit Report for 12/25/2020 Allergy List Details Patient Name: [name], [name]. Date of Service: 12/25/2020 12:45 PM Medical Record Number: [id number] Patient Account Number: [id number] Date of Birth/Sex: [birth year] (87 y.o. Female) Treating RN: [name] Primary Care : [name] Other Clinician: Referring : [name] Treating /Extender: [name] Weeks in Treatment: 0 Allergies Active Allergies penicillin shrimp ACE Inhibitors Allergy Notes Electronic Signature(s) Signed: 12/25/2020 4:32:38 PM By: Kimberly Amen RN [name] on 12/25/2020 13:12:53 [name] ([id number]) -------------------------------------------------------------------------------- Arrival Information Details Patient Name: [name] Date of Service: 12/25/2020 12:45 PM Medical Record Number: [id number] Patient Account Number: [id number] Date of Birth/Sex: 1931-11-24 (87 y.o. Female) Treating RN: [name] Primary Care : [name] Other Clinician: Referring : [name] Treating /Extender: [name] in Treatment: 0 Visit Information Patient Arrived: Wheel Chair Arrival Time: 13:04 Accompanied By: daughter Transfer Assistance: EasyPivot Patient Lift Patient Identification Verified: Yes Secondary Verification Process Completed: Yes Electronic Signature(s) Signed: 12/25/2020 4:32:38 PM By: Kimberly Amen RN [name] on 12/25/2020 13:05:03 [name] ([id number]) -------------------------------------------------------------------------------- Clinic Level of Care Assessment Details Patient Name: [name] Date of Service: 12/25/2020 12:45 PM Medical Record Number: [id number] Patient Account Number: [id number] Date of Birth/Sex: January 15, 1932 (87 y.o. Female) Treating RN: [name] Primary Care : [name] Other Clinician: Referring  : [name] Treating /Extender: [name] in Treatment: 0 Clinic Level of Care Assessment Items TOOL 1 Quantity Score X - Use when EandM and Procedure is performed on INITIAL visit 1 0 ASSESSMENTS - Nursing Assessment / Reassessment X - General Physical Exam (combine w/ comprehensive assessment (listed just below) when performed on new 1 20 pt. evals) X- 1 25 Comprehensive Assessment (HX, ROS, Risk Assessments, Wounds Hx, etc.) ASSESSMENTS - Wound and Skin Assessment / Reassessment [] - Dermatologic / Skin Assessment (not related to wound area) 0 ASSESSMENTS - Ostomy and/or Continence Assessment and Care [] - Incontinence Assessment and Management 0 [] - 0 Ostomy Care Assessment and Management (repouching, etc.) PROCESS - Coordination of Care X - Simple Patient / Family Education for ongoing care 1 15 [] - 0 Complex (extensive) Patient / Family Education for ongoing care X- 1 10 Staff obtains Programmer, systems, Records, Test Results / Process Orders [] - 0 Staff telephones HHA, Nursing Homes / Clarify orders / etc [] - 0 Routine Transfer to another Facility (non-emergent condition) [] - 0 Routine Hospital Admission (non-emergent condition) X- 1 15 New Admissions / Biomedical engineer / Ordering NPWT, Apligraf, etc. [] - 0 Emergency Hospital Admission (emergent condition) PROCESS - Special Needs [] - Pediatric / Minor Patient Management 0 [] - 0 Isolation Patient Management [] - 0 Hearing / Language / Visual special needs [] - 0 Assessment of Community assistance (transportation, D/C planning, etc.) [] - 0 Additional assistance / Altered mentation [] - 0 Support Surface(s) Assessment (bed, cushion, seat, etc.) INTERVENTIONS - Miscellaneous [] - External ear exam 0 [] - 0 Patient Transfer (multiple staff / Civil Service fast streamer / Similar devices) [] - 0 Simple Staple / Suture removal (25 or less) [] - 0 Complex Staple / Suture removal (26 or more) []  - 0 Hypo/Hyperglycemic Management (do not check if billed separately) X- 1 15 Ankle / Brachial Index (ABI) - do not check if billed separately Has the patient been seen at the hospital within the last three years: Yes Total Score: 100 Level Of Care: New/Established - Level 3 Peeks,  DESHEA [name] ([id number]) Electronic Signature(s) Signed: 12/25/2020 4:32:38 PM By: Kimberly Amen RN [name] on 12/25/2020 14:41:56 [name] ([id number]) -------------------------------------------------------------------------------- Encounter Discharge Information Details Patient Name: [name] Date of Service: 12/25/2020 12:45 PM Medical Record Number: [id number] Patient Account Number: [id number] Date of Birth/Sex: 07/25/1931 (87 y.o. Female) Treating RN: [name] Primary Care : [name] Other Clinician: Referring : [name] Treating /Extender: [name] in Treatment: 0 Encounter Discharge Information Items Post Procedure Vitals Discharge Condition: Stable Temperature (F): 98.5 Ambulatory Status: Wheelchair Pulse (bpm): 76 Discharge Destination: Home Respiratory Rate (breaths/min): 18 Transportation: Private Auto Blood Pressure (mmHg): 147/84 Accompanied By: daughter Schedule Follow-up Appointment: Yes Clinical Summary of Care: Electronic Signature(s) Signed: 12/25/2020 2:43:07 PM By: Kimberly Amen RN [name] on 12/25/2020 14:43:07 [name] ([id number]) -------------------------------------------------------------------------------- Lower Extremity Assessment Details Patient Name: [name] Date of Service: 12/25/2020 12:45 PM Medical Record Number: [id number] Patient Account Number: [id number] Date of Birth/Sex: August 16, 1931 (87 y.o. Female) Treating RN: [name] Primary Care : [name] Other Clinician: Referring : [name] Treating /Extender:  [name] Weeks in Treatment: 0 Edema Assessment Assessed: [Left: No] [Right: Yes] Edema: [Left: N] [Right: o] Vascular Assessment Pulses: Dorsalis Pedis Palpable: [Right:Yes] Doppler Audible: [Right:Yes] Blood Pressure: Brachial: [Right:140] Dorsalis Pedis: 124 Ankle: Posterior Tibial: 142 Ankle Brachial Index: [Right:1.01] Electronic Signature(s) Signed: 12/25/2020 4:32:38 PM By: Kimberly Amen RN [name] on 12/25/2020 13:36:55 Novicki, Royetta [name] ([id number]) -------------------------------------------------------------------------------- Multi Wound Chart Details Patient Name: [name] Date of Service: 12/25/2020 12:45 PM Medical Record Number: [id number] Patient Account Number: [id number] Date of Birth/Sex: [birth year] (87 y.o. Female) Treating RN: [name] Primary Care : [name] Other Clinician: Referring : [name] Treating /Extender: [name] in Treatment: 0 Vital Signs Height(in): Pulse(bpm): 51 Weight(lbs): Blood Pressure(mmHg): 147/84 Body Mass Index(BMI): Temperature(F): 98.5 Respiratory Rate(breaths/min): 18 Photos: [N/A:N/A] Wound Location: Right Calcaneus N/A N/A Wounding Event: Pressure Injury N/A N/A Primary Etiology: Pressure Ulcer N/A N/A Comorbid History: Hypertension, Type II Diabetes, N/A N/A Osteoarthritis, Dementia Date Acquired: 12/25/2020 N/A N/A Weeks of Treatment: 0 N/A N/A Wound Status: Open N/A N/A Measurements L x W x D (cm) 0.4x0.8x0.2 N/A N/A Area (cm) : 0.251 N/A N/A Volume (cm) : 0.05 N/A N/A % Reduction in Area: 0.00% N/A N/A % Reduction in Volume: 0.00% N/A N/A Classification: Category/Stage III N/A N/A Exudate Amount: Medium N/A N/A Exudate Type: Serosanguineous N/A N/A Exudate Color: red, brown N/A N/A Granulation Amount: Medium (34-66%) N/A N/A Granulation Quality: Red N/A N/A Necrotic Amount: Medium (34-66%) N/A N/A Exposed Structures: Fat  Layer (Subcutaneous Tissue): N/A N/A Yes Fascia: No Tendon: No Muscle: No Joint: No Bone: No Treatment Notes Electronic Signature(s) Signed: 12/25/2020 4:32:38 PM By: Kimberly Amen RN [name] on 12/25/2020 13:52:14 Sinor, Royetta [name] ([id number]) -------------------------------------------------------------------------------- Multi-Disciplinary Care Plan Details Patient Name: [name] Date of Service: 12/25/2020 12:45 PM Medical Record Number: [id number] Patient Account Number: [id number] Date of Birth/Sex: 1931-12-12 (87 y.o. Female) Treating RN: [name] Primary Care : [name] Other Clinician: Referring : [name] Treating /Extender: [name] in Treatment: 0 Active Inactive Orientation to the Wound Care Program Nursing Diagnoses: Knowledge deficit related to the wound healing center program Goals: Patient/caregiver will verbalize understanding of the [facility] Program Date Initiated: 12/25/2020 Target Resolution Date: 12/25/2020 Goal Status: Active Interventions: Provide education on orientation to the wound center Notes: Pressure Nursing Diagnoses: Knowledge deficit related  to causes and risk factors for pressure ulcer development Knowledge deficit related to management of pressures ulcers Potential for impaired tissue integrity related to pressure, friction, moisture, and shear Goals: Patient will remain free from development of additional pressure ulcers Date Initiated: 12/25/2020 Target Resolution Date: 01/25/2021 Goal Status: Active Patient will remain free of pressure ulcers Date Initiated: 12/25/2020 Target Resolution Date: 01/25/2021 Goal Status: Active Patient/caregiver will verbalize risk factors for pressure ulcer development Date Initiated: 12/25/2020 Target Resolution Date: 12/25/2020 Goal Status: Active Patient/caregiver will verbalize understanding of pressure ulcer  management Date Initiated: 12/25/2020 Target Resolution Date: 12/25/2020 Goal Status: Active Interventions: Assess: immobility, friction, shearing, incontinence upon admission and as needed Assess offloading mechanisms upon admission and as needed Assess potential for pressure ulcer upon admission and as needed Provide education on pressure ulcers Notes: Wound/Skin Impairment Nursing Diagnoses: Impaired tissue integrity Goals: Patient/caregiver will verbalize understanding of skin care regimen Date Initiated: 12/25/2020 Target Resolution Date: 12/25/2020 Goal Status: Active Taketa, Jamani E. ([id number]) Ulcer/skin breakdown will have a volume reduction of 30% by week 4 Date Initiated: 12/25/2020 Target Resolution Date: 01/25/2021 Goal Status: Active Ulcer/skin breakdown will have a volume reduction of 50% by week 8 Date Initiated: 12/25/2020 Target Resolution Date: 02/24/2021 Goal Status: Active Ulcer/skin breakdown will have a volume reduction of 80% by week 12 Date Initiated: 12/25/2020 Target Resolution Date: 03/27/2021 Goal Status: Active Ulcer/skin breakdown will heal within 14 weeks Date Initiated: 12/25/2020 Target Resolution Date: 04/27/2021 Goal Status: Active Interventions: Assess patient/caregiver ability to obtain necessary supplies Assess patient/caregiver ability to perform ulcer/skin care regimen upon admission and as needed Assess ulceration(s) every visit Provide education on ulcer and skin care Treatment Activities: Referred to DME Florine Sprenkle for dressing supplies : 12/25/2020 Skin care regimen initiated : 12/25/2020 Notes: Electronic Signature(s) Signed: 12/25/2020 4:32:38 PM By: Kimberly Amen RN [name] on 12/25/2020 13:52:02 Litts, Royetta [name] ([id number]) -------------------------------------------------------------------------------- Pain Assessment Details Patient Name: [name] Date of Service: 12/25/2020 12:45 PM Medical Record  Number: [id number] Patient Account Number: [id number] Date of Birth/Sex: 05-15-1931 (87 y.o. Female) Treating RN: [name] Primary Care Marshay Slates: [name] Other Clinician: Referring Beckey Polkowski: [name] Treating Pricilla Moehle/Extender: [name] in Treatment: 0 Active Problems Location of Pain Severity and Description of Pain Patient Has Paino No Site Locations Pain Management and Medication Current Pain Management: Electronic Signature(s) Signed: 12/25/2020 4:32:38 PM By: Kimberly Amen RN [name] on 12/25/2020 13:05:09 Manning, Royetta [name] ([id number]) -------------------------------------------------------------------------------- Patient/Caregiver Education Details Patient Name: [name] Date of Service: 12/25/2020 12:45 PM Medical Record Number: [id number] Patient Account Number: [id number] Date of Birth/Gender: 06-06-1931 (87 y.o. Female) Treating RN: [name] Primary Care Physician: [name] Other Clinician: Referring Physician: Kelton [name] Treating Physician/Extender: [name] in Treatment: 0 Education Assessment Education Provided To: Caregiver daughter Education Topics Provided Pressure: Methods: Explain/Verbal Responses: State content correctly Welcome To [facility] [place]: Methods: Explain/Verbal Responses: State content correctly Wound/Skin Impairment: Methods: Explain/Verbal Responses: State content correctly Electronic Signature(s) Signed: 12/25/2020 4:32:38 PM By: Kimberly Amen RN [name] on 12/25/2020 14:42:20 Strider, Royetta [name] ([id number]) -------------------------------------------------------------------------------- Wound Assessment Details Patient Name: [name] Date of Service: 12/25/2020 12:45 PM Medical Record Number: [id number] Patient Account Number: [id number] Date of Birth/Sex: Dec 17, 1931 (87 y.o. Female) Treating RN: [name] Primary Care Lucrezia Dehne:  [name] Other Clinician: Referring Basil Buffin: [name] Treating [name]/Extender: [name] Weeks in Treatment: 0 Wound Status Wound Number: 1 Primary Etiology: Pressure Ulcer Wound Location: Right Calcaneus  Wound Status: Open Wounding Event: Pressure Injury Comorbid Hypertension, Type II Diabetes, Osteoarthritis, History: Dementia Date Acquired: 12/25/2020 Weeks Of Treatment: 0 Clustered Wound: No Photos Wound Measurements Length: (cm) 0.4 Width: (cm) 0.8 Depth: (cm) 0.2 Area: (cm) 0.251 Volume: (cm) 0.05 % Reduction in Area: 0% % Reduction in Volume: 0% Tunneling: No Undermining: No Wound Description Classification: Category/Stage III Exudate Amount: Medium Exudate Type: Serosanguineous Exudate Color: red, brown Foul Odor After Cleansing: No Slough/Fibrino Yes Wound Bed Granulation Amount: Medium (34-66%) Exposed Structure Granulation Quality: Red Fascia Exposed: No Necrotic Amount: Medium (34-66%) Fat Layer (Subcutaneous Tissue) Exposed: Yes Necrotic Quality: Adherent Slough Tendon Exposed: No Muscle Exposed: No Joint Exposed: No Bone Exposed: No Treatment Notes Wound #1 (Calcaneus) Wound Laterality: Right Cleanser [place] Ancillary Kit - 15 Day Supply Discharge Instruction: Use supplies as instructed; Kit contains: (15) Saline Bullets; (15) 3x3 Gauze; 15 pr Gloves Normal Saline Discharge Instruction: Wash your hands with soap and water. Remove old dressing, discard into plastic bag and place into trash. Cleanse the wound with Normal Saline prior to applying a clean dressing using gauze sponges, not tissues or cotton balls. Do not Cuffee, Mattelyn E. ([id number]) scrub or use excessive force. Pat dry using gauze sponges, not tissue or cotton balls. Peri-Wound Care Topical Primary Dressing Prisma 4.34 (in) Discharge Instruction: Moisten w/normal saline or sterile water; Cover wound as directed. Do not remove from wound bed. Zetuvit Plus Silicone  Border Dressing 5x5 (in/in) Discharge Instruction: Secure the collagen Secondary Dressing Secured With Compression Wrap Compression Stockings Add-Ons Electronic Signature(s) Signed: 12/25/2020 4:32:38 PM By: Kimberly Amen RN [name] on 12/25/2020 13:22:57 Brenn, Royetta [name] ([id number]) -------------------------------------------------------------------------------- [place] Details Patient Name: [name] Date of Service: 12/25/2020 12:45 PM Medical Record Number: [id number] Patient Account Number: [id number] Date of Birth/Sex: 29-Nov-1931 (87 y.o. Female) Treating RN: [name] Primary Care Saundra Gin: [name] Other Clinician: Referring Kacia Halley: [name] Treating Razi Hickle/Extender: [name] in Treatment: 0 Vital Signs Time Taken: 13:05 Temperature (F): 98.5 Height (in): 63 Pulse (bpm): 76 Source: Stated Respiratory Rate (breaths/min): 18 Weight (lbs): 196 Blood Pressure (mmHg): 147/84 Source: Measured Reference Range: 80 - 120 mg / dl Body Mass Index (BMI): 34.7 Electronic Signature(s) Signed: 12/25/2020 3:08:19 PM By: Kimberly Amen RN [name] on 12/25/2020 15:08:19

## 2020-12-28 DIAGNOSIS — S91301D Unspecified open wound, right foot, subsequent encounter: Secondary | ICD-10-CM | POA: Diagnosis not present

## 2021-01-03 ENCOUNTER — Other Ambulatory Visit: Payer: Self-pay

## 2021-01-03 ENCOUNTER — Encounter: Payer: Medicare Other | Admitting: Physician Assistant

## 2021-01-03 DIAGNOSIS — E119 Type 2 diabetes mellitus without complications: Secondary | ICD-10-CM | POA: Diagnosis not present

## 2021-01-03 DIAGNOSIS — L89613 Pressure ulcer of right heel, stage 3: Secondary | ICD-10-CM | POA: Diagnosis not present

## 2021-01-03 NOTE — Progress Notes (Addendum)
[name] ([id number]) Visit Report for 01/03/2021 Chief Complaint Document Details Patient Name: [name]. Date of Service: 01/03/2021 2:45 PM Medical Record Number: [id number] Patient Account Number: [id number] Date of Birth/Sex: [birth year] (87 y.o. F) Treating RN: Cornell Barman Primary Care Provider: Kelton Pillar Other Clinician: Referring Provider: Kelton Pillar Treating Provider/Extender: Skipper Cliche in Treatment: 1 Information Obtained from: Patient Chief Complaint Right heel pressure ulcer Electronic Signature(s) Signed: 01/03/2021 2:38:49 PM By: Worthy Keeler PA-C Entered By: Worthy Keeler on 01/03/2021 14:38:49 [name] ([id number]) -------------------------------------------------------------------------------- Debridement Details Patient Name: [name] Date of Service: 01/03/2021 2:45 PM Medical Record Number: [id number] Patient Account Number: [id number] Date of Birth/Sex: May 30, 1931 (87 y.o. F) Treating RN: Cornell Barman Primary Care Provider: Kelton Pillar Other Clinician: Referring Provider: Kelton Pillar Treating Provider/Extender: Skipper Cliche in Treatment: 1 Debridement Performed for Wound #1 Right Calcaneus Assessment: Performed By: Physician Tommie Sams., PA-C Debridement Type: Debridement Level of Consciousness (Pre- Awake and Alert procedure): Pre-procedure Verification/Time Out Yes - 15:38 Taken: Total Area Debrided (L x W): 0.3 (cm) x 0.9 (cm) = 0.27 (cm) Tissue and other material Viable, Non-Viable, Slough, Subcutaneous, Skin: Dermis , Skin: Epidermis, Slough debrided: Level: Skin/Subcutaneous Tissue Debridement Description: Excisional Instrument: Curette Bleeding: Minimum Hemostasis Achieved: Pressure Response to Treatment: Procedure was tolerated well Level of Consciousness (Post- Awake and Alert procedure): Post Debridement Measurements of Total Wound Length: (cm) 0.3 Stage: Category/Stage III Width:  (cm) 0.9 Depth: (cm) 0.3 Volume: (cm) 0.064 Character of Wound/Ulcer Post Debridement: Stable Post Procedure Diagnosis Same as Pre-procedure Electronic Signature(s) Signed: 01/03/2021 4:43:43 PM By: Worthy Keeler PA-C Signed: 01/03/2021 5:37:36 PM By: Gretta Cool, BSN, RN, CWS, Kim RN, BSN Entered By: Gretta Cool, BSN, RN, CWS, Kim on 01/03/2021 15:39:30 [name] ([id number]) -------------------------------------------------------------------------------- HPI Details Patient Name: [name] Date of Service: 01/03/2021 2:45 PM Medical Record Number: [id number] Patient Account Number: [id number] Date of Birth/Sex: 08/30/1931 (87 y.o. F) Treating RN: Cornell Barman Primary Care Provider: Kelton Pillar Other Clinician: Referring Provider: Kelton Pillar Treating Provider/Extender: Skipper Cliche in Treatment: 1 History of Present Illness HPI Description: 12/25/20 upon evaluation today patient presents for a wound on her heel which has been present for about 1 and half months or so according to her daughter. The patient does have dementia. With that being said her daughter is the primary caregiver and seems to be doing an excellent job. She had a fairly significant wound on the heel with eschar covering however this was lifting up and it appears to be mostly healed underneath the biggest issue is on the posterior aspect of the heel where she has a small slit of an opening in the Achilles region which is actually what is mainly painful for her today. We do not have a hemoglobin A1c as the patient actually is seen by a provider outside of the [facility] system so I am not able to look this up. With that being said I do not see any evidence of active infection and I do think that she is actually doing quite well I think her daughter is doing a great job taking care of the wound in particular. With regard to her past medical history she does have dementia and diabetes mellitus type 2 but otherwise no  major medical problems. She did have an x-ray performed 11/27/2020 which was negative for any signs of osteomyelitis. 01/03/2021 upon evaluation today patient appears to be doing well with regard to her heel ulcer. She  has been tolerating the dressing changes without complication the collagen seems to be doing a good job for her. I do not see any signs of infection at this point. Unfortunately the patient is stated to have a wound on her bottom although her daughter is not sure how bad this really is next week we will get a get her into a room with a bed so that we can actually check this out I do not want things to get worse unnecessarily. Electronic Signature(s) Signed: 01/03/2021 3:44:17 PM By: Worthy Keeler PA-C Entered By: Worthy Keeler on 01/03/2021 15:44:17 [name] ([id number]) -------------------------------------------------------------------------------- Physical Exam Details Patient Name: [name] Date of Service: 01/03/2021 2:45 PM Medical Record Number: [id number] Patient Account Number: [id number] Date of Birth/Sex: 1931/03/24 (87 y.o. F) Treating RN: Cornell Barman Primary Care Provider: Kelton Pillar Other Clinician: Referring Provider: Kelton Pillar Treating Provider/Extender: Jeri Cos Weeks in Treatment: 1 Constitutional Well-nourished and well-hydrated in no acute distress. Respiratory normal breathing without difficulty. Psychiatric this patient is able to make decisions and demonstrates good insight into disease process. Alert and Oriented x 3. pleasant and cooperative. Notes Upon inspection patient's wound bed showed signs of good granulation epithelization at this point. Fortunately there does not appear to be any evidence of active infection systemically I did perform sharp debridement clear away some of the necrotic debris including slough and biofilm as well as callus down to good subcutaneous tissue postdebridement this appears to be doing  significantly better. Electronic Signature(s) Signed: 01/03/2021 3:44:41 PM By: Worthy Keeler PA-C Entered By: Worthy Keeler on 01/03/2021 15:44:41 Lessley, Royetta [name] ([id number]) -------------------------------------------------------------------------------- Physician Orders Details Patient Name: [name] Date of Service: 01/03/2021 2:45 PM Medical Record Number: [id number] Patient Account Number: [id number] Date of Birth/Sex: 11/14/1931 (87 y.o. F) Treating RN: Cornell Barman Primary Care Provider: Kelton Pillar Other Clinician: Referring Provider: Kelton Pillar Treating Provider/Extender: Skipper Cliche in Treatment: 1 Verbal / Phone Orders: No Diagnosis Coding ICD-10 Coding Code Description [phone] Pressure ulcer of right heel, stage 3 E11.622 Type 2 diabetes mellitus with other skin ulcer F01.50 Vascular dementia without behavioral disturbance Follow-up Appointments o Return Appointment in 1 week. Bathing/ Shower/ Hygiene o May shower; gently cleanse wound with antibacterial soap, rinse and pat dry prior to dressing wounds Off-Loading o Other: - Patient to purchase slippers with rubber soles Wound Treatment Wound #1 - Calcaneus Wound Laterality: Right Cleanser: Byram Ancillary Kit - 15 Day Supply (Generic) 3 x Per Week/30 Days Discharge Instructions: Use supplies as instructed; Kit contains: (15) Saline Bullets; (15) 3x3 Gauze; 15 pr Gloves Cleanser: Normal Saline 3 x Per Week/30 Days Discharge Instructions: Wash your hands with soap and water. Remove old dressing, discard into plastic bag and place into trash. Cleanse the wound with Normal Saline prior to applying a clean dressing using gauze sponges, not tissues or cotton balls. Do not scrub or use excessive force. Pat dry using gauze sponges, not tissue or cotton balls. Primary Dressing: Prisma 4.34 (in) 3 x Per Week/30 Days Discharge Instructions: Moisten w/normal saline or sterile water; Cover wound as  directed. Do not remove from wound bed. Primary Dressing: Zetuvit Plus Silicone Border Dressing 5x5 (in/in) (Generic) 3 x Per Week/30 Days Discharge Instructions: Secure the collagen Electronic Signature(s) Signed: 01/03/2021 4:43:43 PM By: Worthy Keeler PA-C Signed: 01/03/2021 5:37:36 PM By: Gretta Cool, BSN, RN, CWS, Kim RN, BSN Entered By: Gretta Cool, BSN, RN, CWS, Kim on 01/03/2021 15:50:36 Heathcock, Royetta [name] ([id number]) -------------------------------------------------------------------------------- Problem  List Details Patient Name: Lwin, TASHONDA PINKUS Date of Service: 01/03/2021 2:45 PM Medical Record Number: [id number] Patient Account Number: [id number] Date of Birth/Sex: 1932-01-13 (87 y.o. F) Treating RN: Cornell Barman Primary Care Provider: Kelton Pillar Other Clinician: Referring Provider: Kelton Pillar Treating Provider/Extender: Jeri Cos Weeks in Treatment: 1 Active Problems ICD-10 Encounter Code Description Active Date MDM Diagnosis L89.613 Pressure ulcer of right heel, stage 3 12/25/2020 No Yes E11.622 Type 2 diabetes mellitus with other skin ulcer 12/25/2020 No Yes F01.50 Vascular dementia without behavioral disturbance 12/25/2020 No Yes Inactive Problems Resolved Problems Electronic Signature(s) Signed: 01/03/2021 2:38:44 PM By: Worthy Keeler PA-C Entered By: Worthy Keeler on 01/03/2021 14:38:44 Kensinger, Royetta [name] ([id number]) -------------------------------------------------------------------------------- Progress Note Details Patient Name: [name] Date of Service: 01/03/2021 2:45 PM Medical Record Number: [id number] Patient Account Number: [id number] Date of Birth/Sex: 20-Oct-1931 (87 y.o. F) Treating RN: Cornell Barman Primary Care Provider: Kelton Pillar Other Clinician: Referring Provider: Kelton Pillar Treating Provider/Extender: Skipper Cliche in Treatment: 1 Subjective Chief Complaint Information obtained from Patient Right heel pressure ulcer History of  Present Illness (HPI) 12/25/20 upon evaluation today patient presents for a wound on her heel which has been present for about 1 and half months or so according to her daughter. The patient does have dementia. With that being said her daughter is the primary caregiver and seems to be doing an excellent job. She had a fairly significant wound on the heel with eschar covering however this was lifting up and it appears to be mostly healed underneath the biggest issue is on the posterior aspect of the heel where she has a small slit of an opening in the Achilles region which is actually what is mainly painful for her today. We do not have a hemoglobin A1c as the patient actually is seen by a provider outside of the [facility] system so I am not able to look this up. With that being said I do not see any evidence of active infection and I do think that she is actually doing quite well I think her daughter is doing a great job taking care of the wound in particular. With regard to her past medical history she does have dementia and diabetes mellitus type 2 but otherwise no major medical problems. She did have an x-ray performed 11/27/2020 which was negative for any signs of osteomyelitis. 01/03/2021 upon evaluation today patient appears to be doing well with regard to her heel ulcer. She has been tolerating the dressing changes without complication the collagen seems to be doing a good job for her. I do not see any signs of infection at this point. Unfortunately the patient is stated to have a wound on her bottom although her daughter is not sure how bad this really is next week we will get a get her into a room with a bed so that we can actually check this out I do not want things to get worse unnecessarily. Objective Constitutional Well-nourished and well-hydrated in no acute distress. Vitals Time Taken: 3:13 PM, Height: 63 in, Weight: 196 lbs, BMI: 34.7, Temperature: 98.5 F, Pulse: 72 bpm, Respiratory Rate:  18 breaths/min, Blood Pressure: 131/82 mmHg. Respiratory normal breathing without difficulty. Psychiatric this patient is able to make decisions and demonstrates good insight into disease process. Alert and Oriented x 3. pleasant and cooperative. General Notes: Upon inspection patient's wound bed showed signs of good granulation epithelization at this point. Fortunately there does not appear to be any evidence of active  infection systemically I did perform sharp debridement clear away some of the necrotic debris including slough and biofilm as well as callus down to good subcutaneous tissue postdebridement this appears to be doing significantly better. Integumentary (Hair, Skin) Wound #1 status is Open. Original cause of wound was Pressure Injury. The date acquired was: 12/25/2020. The wound has been in treatment 1 weeks. The wound is located on the Right Calcaneus. The wound measures 0.3cm length x 0.9cm width x 0.2cm depth; 0.212cm^2 area and 0.042cm^3 volume. There is Fat Layer (Subcutaneous Tissue) exposed. There is a medium amount of serosanguineous drainage noted. There is small (1-33%) red granulation within the wound bed. There is a large (67-100%) amount of necrotic tissue within the wound bed including Adherent Slough. Assessment Walk[place]ak, Annikah E. ([id number]) Active Problems ICD-10 Pressure ulcer of right heel, stage 3 Type 2 diabetes mellitus with other skin ulcer Vascular dementia without behavioral disturbance Procedures Wound #1 Pre-procedure diagnosis of Wound #1 is a Pressure Ulcer located on the Right Calcaneus . There was a Excisional Skin/Subcutaneous Tissue Debridement with a total area of 0.27 sq cm performed by Tommie Sams., PA-C. With the following instrument(s): Curette to remove Viable and Non-Viable tissue/material. Material removed includes Subcutaneous Tissue, Slough, Skin: Dermis, and Skin: Epidermis. No specimens were taken. A time out was conducted at 15:38,  prior to the start of the procedure. A Minimum amount of bleeding was controlled with Pressure. The procedure was tolerated well. Post Debridement Measurements: 0.3cm length x 0.9cm width x 0.3cm depth; 0.064cm^3 volume. Post debridement Stage noted as Category/Stage III. Character of Wound/Ulcer Post Debridement is stable. Post procedure Diagnosis Wound #1: Same as Pre-Procedure Plan 1. Would recommend currently that we go ahead and continue with the wound care measures as before and the patient is in agreement with that plan. This includes the use of the silver collagen which I think is doing a good job cover with a Zetuvit border foam dressing. 2. I am also can recommend we have the patient continue to monitor for any signs of worsening or infection obviously if anything changes she should let me know as soon as possible. We will see patient back for reevaluation in 1 week here in the clinic. If anything worsens or changes patient will contact our office for additional recommendations. Electronic Signature(s) Signed: 01/03/2021 3:45:14 PM By: Worthy Keeler PA-C Entered By: Worthy Keeler on 01/03/2021 15:45:14 Pote, Royetta [name] ([id number]) -------------------------------------------------------------------------------- SuperBill Details Patient Name: [name] Date of Service: 01/03/2021 Medical Record Number: [id number] Patient Account Number: [id number] Date of Birth/Sex: [birth year] (87 y.o. F) Treating RN: Cornell Barman Primary Care Provider: Kelton Pillar Other Clinician: Referring Provider: Kelton Pillar Treating Provider/Extender: Jeri Cos Weeks in Treatment: 1 Diagnosis Coding ICD-10 Codes Code Description [phone] Pressure ulcer of right heel, stage 3 E11.622 Type 2 diabetes mellitus with other skin ulcer F01.50 Vascular dementia without behavioral disturbance Facility Procedures CPT4 Code: [id number] Description: 29191 - DEB SUBQ TISSUE 20 SQ CM/< Modifier: Quantity:  1 CPT4 Code: Description: ICD-10 Diagnosis Description L89.613 Pressure ulcer of right heel, stage 3 Modifier: Quantity: Physician Procedures CPT4 Code: 6606004 Description: 11042 - WC PHYS SUBQ TISS 20 SQ CM Modifier: Quantity: 1 CPT4 Code: Description: ICD-10 Diagnosis Description L89.613 Pressure ulcer of right heel, stage 3 Modifier: Quantity: Electronic Signature(s) Signed: 01/03/2021 3:45:26 PM By: Worthy Keeler PA-C Entered By: Worthy Keeler on 01/03/2021 15:45:26

## 2021-01-03 NOTE — Progress Notes (Addendum)
Schaaf, BRYLEA PITA ([id number]) Visit Report for 01/03/2021 Arrival Information Details Patient Name: Brach, DAMIKA HARMON Date of Service: 01/03/2021 2:45 PM Medical Record Number: [id number] Patient Account Number: [id number] Date of Birth/Sex: 24-Apr-1931 (87 y.o. F) Treating RN: Cornell Barman Primary Care Valincia Touch: Kelton Pillar Other Clinician: Referring Tashauna Caisse: Kelton Pillar Treating Shawndale Kilpatrick/Extender: Skipper Cliche in Treatment: 1 Visit Information History Since Last Visit Added or deleted any medications: No Patient Arrived: Wheel Chair Pain Present Now: Yes Arrival Time: 15:05 Accompanied By: daughter Transfer Assistance: EasyPivot Patient Lift Patient Identification Verified: Yes Secondary Verification Process Completed: Yes Patient Has Alerts: Yes Patient Alerts: Type II Diabetic Electronic Signature(s) Signed: 01/03/2021 5:37:36 PM By: Gretta Cool, BSN, RN, CWS, Kim RN, BSN Entered By: Gretta Cool, BSN, RN, CWS, Kim on 01/03/2021 15:13:44 Pacella, [name] ([id number]) -------------------------------------------------------------------------------- Encounter Discharge Information Details Patient Name: [name] Date of Service: 01/03/2021 2:45 PM Medical Record Number: [id number] Patient Account Number: [id number] Date of Birth/Sex: 1931/04/09 (87 y.o. F) Treating RN: Cornell Barman Primary Care Sha Burling: Kelton Pillar Other Clinician: Referring Morey Andonian: Kelton Pillar Treating Jillienne Egner/Extender: Skipper Cliche in Treatment: 1 Encounter Discharge Information Items Post Procedure Vitals Discharge Condition: Stable Temperature (F): 98.5 Ambulatory Status: Wheelchair Pulse (bpm): 72 Discharge Destination: Home Respiratory Rate (breaths/min): 16 Transportation: Private Auto Blood Pressure (mmHg): 131/82 Accompanied By: daughter Schedule Follow-up Appointment: Yes Clinical Summary of Care: Electronic Signature(s) Signed: 01/03/2021 5:37:36 PM By: Gretta Cool, BSN, RN, CWS, Kim RN,  BSN Entered By: Gretta Cool, BSN, RN, CWS, Kim on 01/03/2021 15:51:57 Crego, [name] ([id number]) -------------------------------------------------------------------------------- Lower Extremity Assessment Details Patient Name: Kube, [name] Date of Service: 01/03/2021 2:45 PM Medical Record Number: [id number] Patient Account Number: [id number] Date of Birth/Sex: 1931-05-05 (87 y.o. F) Treating RN: Cornell Barman Primary Care Broghan Pannone: Kelton Pillar Other Clinician: Referring Christopher Glasscock: Kelton Pillar Treating Mellanie Bejarano/Extender: Skipper Cliche in Treatment: 1 Vascular Assessment Pulses: Dorsalis Pedis Palpable: [Right:Yes] Electronic Signature(s) Signed: 01/03/2021 5:37:36 PM By: Gretta Cool, BSN, RN, CWS, Kim RN, BSN Entered By: Gretta Cool, BSN, RN, CWS, Kim on 01/03/2021 15:21:27 Woo, [name] ([id number]) -------------------------------------------------------------------------------- Multi Wound Chart Details Patient Name: [name] Date of Service: 01/03/2021 2:45 PM Medical Record Number: [id number] Patient Account Number: [id number] Date of Birth/Sex: 06/03/1931 (87 y.o. F) Treating RN: Cornell Barman Primary Care Jewelz Kobus: Kelton Pillar Other Clinician: Referring Julietta Batterman: Kelton Pillar Treating Ambra Haverstick/Extender: Skipper Cliche in Treatment: 1 Vital Signs Height(in): 63 Pulse(bpm): 72 Weight(lbs): 196 Blood Pressure(mmHg): 131/82 Body Mass Index(BMI): 35 Temperature(F): 98.5 Respiratory Rate(breaths/min): 18 Photos: [N/A:N/A] Wound Location: Right Calcaneus N/A N/A Wounding Event: Pressure Injury N/A N/A Primary Etiology: Pressure Ulcer N/A N/A Comorbid History: Hypertension, Type II Diabetes, N/A N/A Osteoarthritis, Dementia Date Acquired: 12/25/2020 N/A N/A Weeks of Treatment: 1 N/A N/A Wound Status: Open N/A N/A Measurements L x W x D (cm) 0.3x0.9x0.2 N/A N/A Area (cm) : 0.212 N/A N/A Volume (cm) : 0.042 N/A N/A % Reduction in Area: 15.50% N/A N/A % Reduction in  Volume: 16.00% N/A N/A Classification: Category/Stage III N/A N/A Exudate Amount: Medium N/A N/A Exudate Type: Serosanguineous N/A N/A Exudate Color: red, brown N/A N/A Granulation Amount: Small (1-33%) N/A N/A Granulation Quality: Red N/A N/A Necrotic Amount: Large (67-100%) N/A N/A Exposed Structures: Fat Layer (Subcutaneous Tissue): N/A N/A Yes Fascia: No Tendon: No Muscle: No Joint: No Bone: No Epithelialization: None N/A N/A Treatment Notes Electronic Signature(s) Signed: 01/03/2021 5:37:36 PM By: Gretta Cool, BSN, RN, CWS, Kim RN, BSN Entered By: Gretta Cool, BSN, RN, CWS, Kim on 01/03/2021 15:36:28 Mersereau, Shaquilla E. (  [id number]) -------------------------------------------------------------------------------- Multi-Disciplinary Care Plan Details Patient Name: Pelley, ZADA HASER. Date of Service: 01/03/2021 2:45 PM Medical Record Number: [id number] Patient Account Number: [id number] Date of Birth/Sex: 1931-08-18 (87 y.o. F) Treating RN: Cornell Barman Primary Care Toby Breithaupt: Kelton Pillar Other Clinician: Referring Stacie Templin: Kelton Pillar Treating Digby Groeneveld/Extender: Skipper Cliche in Treatment: 1 Active Inactive Orientation to the Wound Care Program Nursing Diagnoses: Knowledge deficit related to the wound healing center program Goals: Patient/caregiver will verbalize understanding of the [facility] Date Initiated: 12/25/2020 Target Resolution Date: 12/25/2020 Goal Status: Active Interventions: Provide education on orientation to the wound center Notes: Pressure Nursing Diagnoses: Knowledge deficit related to causes and risk factors for pressure ulcer development Knowledge deficit related to management of pressures ulcers Potential for impaired tissue integrity related to pressure, friction, moisture, and shear Goals: Patient will remain free from development of additional pressure ulcers Date Initiated: 12/25/2020 Target Resolution Date: 01/25/2021 Goal Status:  Active Patient will remain free of pressure ulcers Date Initiated: 12/25/2020 Target Resolution Date: 01/25/2021 Goal Status: Active Patient/caregiver will verbalize risk factors for pressure ulcer development Date Initiated: 12/25/2020 Target Resolution Date: 12/25/2020 Goal Status: Active Patient/caregiver will verbalize understanding of pressure ulcer management Date Initiated: 12/25/2020 Target Resolution Date: 12/25/2020 Goal Status: Active Interventions: Assess: immobility, friction, shearing, incontinence upon admission and as needed Assess offloading mechanisms upon admission and as needed Assess potential for pressure ulcer upon admission and as needed Provide education on pressure ulcers Notes: Wound/Skin Impairment Nursing Diagnoses: Impaired tissue integrity Goals: Patient/caregiver will verbalize understanding of skin care regimen Date Initiated: 12/25/2020 Target Resolution Date: 12/25/2020 Goal Status: Active Salminen, Alencia E. ([id number]) Ulcer/skin breakdown will have a volume reduction of 30% by week 4 Date Initiated: 12/25/2020 Target Resolution Date: 01/25/2021 Goal Status: Active Ulcer/skin breakdown will have a volume reduction of 50% by week 8 Date Initiated: 12/25/2020 Target Resolution Date: 02/24/2021 Goal Status: Active Ulcer/skin breakdown will have a volume reduction of 80% by week 12 Date Initiated: 12/25/2020 Target Resolution Date: 03/27/2021 Goal Status: Active Ulcer/skin breakdown will heal within 14 weeks Date Initiated: 12/25/2020 Target Resolution Date: 04/27/2021 Goal Status: Active Interventions: Assess patient/caregiver ability to obtain necessary supplies Assess patient/caregiver ability to perform ulcer/skin care regimen upon admission and as needed Assess ulceration(s) every visit Provide education on ulcer and skin care Treatment Activities: Referred to DME Marleigh Kaylor for dressing supplies : 12/25/2020 Skin care regimen initiated :  12/25/2020 Notes: Electronic Signature(s) Signed: 01/03/2021 5:37:36 PM By: Gretta Cool, BSN, RN, CWS, Kim RN, BSN Entered By: Gretta Cool, BSN, RN, CWS, Kim on 01/03/2021 15:22:35 Demery, [name] ([id number]) -------------------------------------------------------------------------------- Pain Assessment Details Patient Name: [name] Date of Service: 01/03/2021 2:45 PM Medical Record Number: [id number] Patient Account Number: [id number] Date of Birth/Sex: 26-Feb-1932 (87 y.o. F) Treating RN: Cornell Barman Primary Care Pilar Westergaard: Kelton Pillar Other Clinician: Referring Eligha Kmetz: Kelton Pillar Treating Akili Corsetti/Extender: Skipper Cliche in Treatment: 1 Active Problems Location of Pain Severity and Description of Pain Patient Has Paino Yes Site Locations Pain Location: Generalized Pain Pain Management and Medication Current Pain Management: Electronic Signature(s) Signed: 01/03/2021 5:37:36 PM By: Gretta Cool, BSN, RN, CWS, Kim RN, BSN Entered By: Gretta Cool, BSN, RN, CWS, Kim on 01/03/2021 15:16:40 Navarrette, [name] ([id number]) -------------------------------------------------------------------------------- Patient/Caregiver Education Details Patient Name: [name] Date of Service: 01/03/2021 2:45 PM Medical Record Number: [id number] Patient Account Number: [id number] Date of Birth/Gender: [birth year] (87 y.o. F) Treating RN: Cornell Barman Primary Care Physician: Kelton Pillar Other Clinician: Referring Physician: Kelton Pillar Treating Physician/Extender: Jeri Cos  Weeks in Treatment: 1 Education Assessment Education Provided To: Patient Education Topics Provided Pressure: Handouts: Pressure Ulcers: Care and Offloading Methods: Demonstration, Explain/Verbal Responses: State content correctly Electronic Signature(s) Signed: 01/03/2021 5:37:36 PM By: Gretta Cool, BSN, RN, CWS, Kim RN, BSN Entered By: Gretta Cool, BSN, RN, CWS, Kim on 01/03/2021 15:50:53 Skilling, [name]  ([id number]) -------------------------------------------------------------------------------- Wound Assessment Details Patient Name: [name] Date of Service: 01/03/2021 2:45 PM Medical Record Number: [id number] Patient Account Number: [id number] Date of Birth/Sex: 1931/12/26 (87 y.o. F) Treating RN: Cornell Barman Primary Care Daejah Klebba: Kelton Pillar Other Clinician: Referring Charlette Hennings: Kelton Pillar Treating Khadija Thier/Extender: Jeri Cos Weeks in Treatment: 1 Wound Status Wound Number: 1 Primary Etiology: Pressure Ulcer Wound Location: Right Calcaneus Wound Status: Open Wounding Event: Pressure Injury Comorbid Hypertension, Type II Diabetes, Osteoarthritis, History: Dementia Date Acquired: 12/25/2020 Weeks Of Treatment: 1 Clustered Wound: No Photos Wound Measurements Length: (cm) 0.3 Width: (cm) 0.9 Depth: (cm) 0.2 Area: (cm) 0.212 Volume: (cm) 0.042 % Reduction in Area: 15.5% % Reduction in Volume: 16% Epithelialization: None Wound Description Classification: Category/Stage III Exudate Amount: Medium Exudate Type: Serosanguineous Exudate Color: red, brown Foul Odor After Cleansing: No Slough/Fibrino Yes Wound Bed Granulation Amount: Small (1-33%) Exposed Structure Granulation Quality: Red Fascia Exposed: No Necrotic Amount: Large (67-100%) Fat Layer (Subcutaneous Tissue) Exposed: Yes Necrotic Quality: Adherent Slough Tendon Exposed: No Muscle Exposed: No Joint Exposed: No Bone Exposed: No Treatment Notes Wound #1 (Calcaneus) Wound Laterality: Right Cleanser Byram Ancillary Kit - 15 Day Supply Discharge Instruction: Use supplies as instructed; Kit contains: (15) Saline Bullets; (15) 3x3 Gauze; 15 pr Gloves Normal Saline Discharge Instruction: Wash your hands with soap and water. Remove old dressing, discard into plastic bag and place into trash. Cleanse the wound with Normal Saline prior to applying a clean dressing using gauze sponges, not tissues or  cotton balls. Do not Oppedisano, Everlean E. ([id number]) scrub or use excessive force. Pat dry using gauze sponges, not tissue or cotton balls. Peri-Wound Care Topical Primary Dressing Prisma 4.34 (in) Discharge Instruction: Moisten w/normal saline or sterile water; Cover wound as directed. Do not remove from wound bed. Zetuvit Plus Silicone Border Dressing 5x5 (in/in) Discharge Instruction: Secure the collagen Secondary Dressing Secured With Compression Wrap Compression Stockings Add-Ons Electronic Signature(s) Signed: 01/03/2021 5:37:36 PM By: Gretta Cool, BSN, RN, CWS, Kim RN, BSN Entered By: Gretta Cool, BSN, RN, CWS, Kim on 01/03/2021 15:20:51 Lafont, [name] ([id number]) -------------------------------------------------------------------------------- [place] Details Patient Name: [name] Date of Service: 01/03/2021 2:45 PM Medical Record Number: [id number] Patient Account Number: [id number] Date of Birth/Sex: [birth year] (87 y.o. F) Treating RN: Cornell Barman Primary Care Jaelynn Currier: Kelton Pillar Other Clinician: Referring Jaymes Hang: Kelton Pillar Treating Parsa Rickett/Extender: Skipper Cliche in Treatment: 1 Vital Signs Time Taken: 15:13 Temperature (F): 98.5 Height (in): 63 Pulse (bpm): 72 Weight (lbs): 196 Respiratory Rate (breaths/min): 18 Body Mass Index (BMI): 34.7 Blood Pressure (mmHg): 131/82 Reference Range: 80 - 120 mg / dl Electronic Signature(s) Signed: 01/03/2021 5:37:36 PM By: Gretta Cool, BSN, RN, CWS, Kim RN, BSN Entered By: Gretta Cool, BSN, RN, CWS, Kim on 01/03/2021 15:14:07

## 2021-01-08 DIAGNOSIS — M1990 Unspecified osteoarthritis, unspecified site: Secondary | ICD-10-CM | POA: Diagnosis not present

## 2021-01-08 DIAGNOSIS — R262 Difficulty in walking, not elsewhere classified: Secondary | ICD-10-CM | POA: Diagnosis not present

## 2021-01-09 DIAGNOSIS — H2513 Age-related nuclear cataract, bilateral: Secondary | ICD-10-CM | POA: Diagnosis not present

## 2021-01-09 DIAGNOSIS — E119 Type 2 diabetes mellitus without complications: Secondary | ICD-10-CM | POA: Diagnosis not present

## 2021-01-09 DIAGNOSIS — H43813 Vitreous degeneration, bilateral: Secondary | ICD-10-CM | POA: Diagnosis not present

## 2021-01-09 DIAGNOSIS — H401132 Primary open-angle glaucoma, bilateral, moderate stage: Secondary | ICD-10-CM | POA: Diagnosis not present

## 2021-01-10 ENCOUNTER — Other Ambulatory Visit: Payer: Self-pay

## 2021-01-10 ENCOUNTER — Encounter: Payer: Medicare Other | Attending: Physician Assistant | Admitting: Physician Assistant

## 2021-01-10 DIAGNOSIS — L97419 Non-pressure chronic ulcer of right heel and midfoot with unspecified severity: Secondary | ICD-10-CM | POA: Diagnosis not present

## 2021-01-10 DIAGNOSIS — M1990 Unspecified osteoarthritis, unspecified site: Secondary | ICD-10-CM | POA: Diagnosis not present

## 2021-01-10 DIAGNOSIS — L89613 Pressure ulcer of right heel, stage 3: Secondary | ICD-10-CM | POA: Insufficient documentation

## 2021-01-10 DIAGNOSIS — E1151 Type 2 diabetes mellitus with diabetic peripheral angiopathy without gangrene: Secondary | ICD-10-CM | POA: Diagnosis not present

## 2021-01-10 DIAGNOSIS — E11622 Type 2 diabetes mellitus with other skin ulcer: Secondary | ICD-10-CM | POA: Insufficient documentation

## 2021-01-10 DIAGNOSIS — L89619 Pressure ulcer of right heel, unspecified stage: Secondary | ICD-10-CM | POA: Diagnosis present

## 2021-01-10 DIAGNOSIS — L98412 Non-pressure chronic ulcer of buttock with fat layer exposed: Secondary | ICD-10-CM | POA: Diagnosis not present

## 2021-01-10 NOTE — Progress Notes (Addendum)
Mcgirr, KAMRON PORTEE ([id number]) Visit Report for 01/10/2021 Arrival Information Details Patient Name: [name], [name] Date of Service: 01/10/2021 2:45 PM Medical Record Number: [id number] Patient Account Number: [id number] Date of Birth/Sex: [birth year] (87 y.o. F) Treating RN: Cornell Barman Primary Care Azaiah Mello: Kelton Pillar Other Clinician: Referring Cammeron Greis: Kelton Pillar Treating Scout Guyett/Extender: Skipper Cliche in Treatment: 2 Visit Information History Since Last Visit Added or deleted any medications: No Patient Arrived: Wheel Chair Has Dressing in Place as Prescribed: Yes Arrival Time: 15:18 Pain Present Now: No Accompanied By: daughter Transfer Assistance: Manual Patient Identification Verified: Yes Secondary Verification Process Completed: Yes Patient Has Alerts: Yes Patient Alerts: Type II Diabetic Electronic Signature(s) Signed: 01/11/2021 12:18:06 PM By: Gretta Cool, BSN, RN, CWS, Kim RN, BSN Entered By: Gretta Cool, BSN, RN, CWS, Kim on 01/10/2021 15:18:49 [name], [name] ([id number]) -------------------------------------------------------------------------------- Encounter Discharge Information Details Patient Name: [name] Date of Service: 01/10/2021 2:45 PM Medical Record Number: [id number] Patient Account Number: [id number] Date of Birth/Sex: [birth year] (87 y.o. F) Treating RN: Cornell Barman Primary Care Raisa Ditto: Kelton Pillar Other Clinician: Referring Lennox Dolberry: Kelton Pillar Treating Gorman Safi/Extender: Skipper Cliche in Treatment: 2 Encounter Discharge Information Items Post Procedure Vitals Discharge Condition: Stable Temperature (F): 98.5 Ambulatory Status: Wheelchair Pulse (bpm): 62 Discharge Destination: Home Respiratory Rate (breaths/min): 18 Transportation: Private Auto Blood Pressure (mmHg): 141/73 Accompanied By: daughter Schedule Follow-up Appointment: No Clinical Summary of Care: Electronic Signature(s) Signed: 01/11/2021 12:18:06 PM By: Gretta Cool, BSN,  RN, CWS, Kim RN, BSN Entered By: Gretta Cool, BSN, RN, CWS, Kim on 01/10/2021 15:59:20 [name], [name] ([id number]) -------------------------------------------------------------------------------- Lower Extremity Assessment Details Patient Name: Trzcinski, [name] Date of Service: 01/10/2021 2:45 PM Medical Record Number: [id number] Patient Account Number: [id number] Date of Birth/Sex: 1931/10/28 (87 y.o. F) Treating RN: Cornell Barman Primary Care Jumar Greenstreet: Kelton Pillar Other Clinician: Referring Taylee Gunnells: Kelton Pillar Treating Kahlie Deutscher/Extender: Jeri Cos Weeks in Treatment: 2 Edema Assessment Assessed: [Left: No] [Right: Yes] Edema: [Left: N] [Right: o] Vascular Assessment Pulses: Dorsalis Pedis Palpable: [Right:Yes] Electronic Signature(s) Signed: 01/11/2021 12:18:06 PM By: Gretta Cool, BSN, RN, CWS, Kim RN, BSN Entered By: Gretta Cool, BSN, RN, CWS, Kim on 01/10/2021 15:37:59 [name], [name] ([id number]) -------------------------------------------------------------------------------- Multi Wound Chart Details Patient Name: [name] Date of Service: 01/10/2021 2:45 PM Medical Record Number: [id number] Patient Account Number: [id number] Date of Birth/Sex: Feb 25, 1932 (87 y.o. F) Treating RN: Cornell Barman Primary Care Dellanira Dillow: Kelton Pillar Other Clinician: Referring Kevonte Vanecek: Kelton Pillar Treating Irish Piech/Extender: Skipper Cliche in Treatment: 2 Vital Signs Height(in): 8 Pulse(bpm): 69 Weight(lbs): 196 Blood Pressure(mmHg): 141/73 Body Mass Index(BMI): 35 Temperature(F): 98.5 Respiratory Rate(breaths/min): 16 Photos: [1:No Photos] [N/A:N/A] Wound Location: Right Calcaneus Left Gluteus N/A Wounding Event: Pressure Injury Trauma N/A Primary Etiology: Pressure Ulcer Trauma, Other N/A Comorbid History: N/A Hypertension, Type II Diabetes, N/A Osteoarthritis, Dementia Date Acquired: 12/25/2020 01/07/2021 N/A Weeks of Treatment: 2 0 N/A Wound Status: Open Open N/A Measurements L x W x D  (cm) 0.3x0.5x0.2 1x0.5x0.1 N/A Area (cm) : 0.118 0.393 N/A Volume (cm) : 0.024 0.039 N/A % Reduction in Area: 53.00% 0.00% N/A % Reduction in Volume: 52.00% 0.00% N/A Classification: Category/Stage III Full Thickness Without Exposed N/A Support Structures Exudate Amount: Medium Small N/A Exudate Type: Serosanguineous Sanguinous N/A Exudate Color: red, brown red N/A Granulation Amount: N/A Small (1-33%) N/A Granulation Quality: N/A Pink N/A Necrotic Amount: N/A Large (67-100%) N/A Epithelialization: N/A None N/A Treatment Notes Electronic Signature(s) Signed: 01/11/2021 12:18:06 PM By: Gretta Cool, BSN, RN, CWS, Kim RN, BSN Entered By: Gretta Cool, BSN, RN, CWS,  Kim on 01/10/2021 15:42:59 [name], [name] ([id number]) -------------------------------------------------------------------------------- Multi-Disciplinary Care Plan Details Patient Name: [name], [name] Date of Service: 01/10/2021 2:45 PM Medical Record Number: [id number] Patient Account Number: [id number] Date of Birth/Sex: 02-28-1932 (87 y.o. F) Treating RN: Cornell Barman Primary Care Loetta Connelley: Kelton Pillar Other Clinician: Referring Chistian Kasler: Kelton Pillar Treating Theodore Rahrig/Extender: Skipper Cliche in Treatment: 2 Active Inactive Orientation to the Wound Care Program Nursing Diagnoses: Knowledge deficit related to the wound healing center program Goals: Patient/caregiver will verbalize understanding of the [facility] Program Date Initiated: 12/25/2020 Target Resolution Date: 12/25/2020 Goal Status: Active Interventions: Provide education on orientation to the wound center Notes: Pressure Nursing Diagnoses: Knowledge deficit related to causes and risk factors for pressure ulcer development Knowledge deficit related to management of pressures ulcers Potential for impaired tissue integrity related to pressure, friction, moisture, and shear Goals: Patient will remain free from development of additional pressure  ulcers Date Initiated: 12/25/2020 Target Resolution Date: 01/25/2021 Goal Status: Active Patient will remain free of pressure ulcers Date Initiated: 12/25/2020 Target Resolution Date: 01/25/2021 Goal Status: Active Patient/caregiver will verbalize risk factors for pressure ulcer development Date Initiated: 12/25/2020 Target Resolution Date: 12/25/2020 Goal Status: Active Patient/caregiver will verbalize understanding of pressure ulcer management Date Initiated: 12/25/2020 Target Resolution Date: 12/25/2020 Goal Status: Active Interventions: Assess: immobility, friction, shearing, incontinence upon admission and as needed Assess offloading mechanisms upon admission and as needed Assess potential for pressure ulcer upon admission and as needed Provide education on pressure ulcers Notes: Wound/Skin Impairment Nursing Diagnoses: Impaired tissue integrity Goals: Patient/caregiver will verbalize understanding of skin care regimen Date Initiated: 12/25/2020 Target Resolution Date: 12/25/2020 Goal Status: Active Sikora, Avaleen E. ([id number]) Ulcer/skin breakdown will have a volume reduction of 30% by week 4 Date Initiated: 12/25/2020 Target Resolution Date: 01/25/2021 Goal Status: Active Ulcer/skin breakdown will have a volume reduction of 50% by week 8 Date Initiated: 12/25/2020 Target Resolution Date: 02/24/2021 Goal Status: Active Ulcer/skin breakdown will have a volume reduction of 80% by week 12 Date Initiated: 12/25/2020 Target Resolution Date: 03/27/2021 Goal Status: Active Ulcer/skin breakdown will heal within 14 weeks Date Initiated: 12/25/2020 Target Resolution Date: 04/27/2021 Goal Status: Active Interventions: Assess patient/caregiver ability to obtain necessary supplies Assess patient/caregiver ability to perform ulcer/skin care regimen upon admission and as needed Assess ulceration(s) every visit Provide education on ulcer and skin care Treatment  Activities: Referred to DME Marli Diego for dressing supplies : 12/25/2020 Skin care regimen initiated : 12/25/2020 Notes: Electronic Signature(s) Signed: 01/11/2021 12:18:06 PM By: Gretta Cool, BSN, RN, CWS, Kim RN, BSN Entered By: Gretta Cool, BSN, RN, CWS, Kim on 01/10/2021 15:42:32 [name], [name] ([id number]) -------------------------------------------------------------------------------- Pain Assessment Details Patient Name: [name] Date of Service: 01/10/2021 2:45 PM Medical Record Number: [id number] Patient Account Number: [id number] Date of Birth/Sex: [birth year] (87 y.o. F) Treating RN: Cornell Barman Primary Care Eisha Chatterjee: Kelton Pillar Other Clinician: Referring Zonnique Norkus: Kelton Pillar Treating Pranish Akhavan/Extender: Skipper Cliche in Treatment: 2 Active Problems Location of Pain Severity and Description of Pain Patient Has Paino No Site Locations Pain Management and Medication Current Pain Management: Notes Patient denies pain in her wound. Electronic Signature(s) Signed: 01/11/2021 12:18:06 PM By: Gretta Cool, BSN, RN, CWS, Kim RN, BSN Entered By: Gretta Cool, BSN, RN, CWS, Kim on 01/10/2021 15:20:00 [name], [name] ([id number]) -------------------------------------------------------------------------------- Patient/Caregiver Education Details Patient Name: [name] Date of Service: 01/10/2021 2:45 PM Medical Record Number: [id number] Patient Account Number: [id number] Date of Birth/Gender: 19-Feb-1932 (87 y.o. F) Treating RN: Cornell Barman Primary Care Physician: Kelton Pillar  Other Clinician: Referring Physician: Kelton Pillar Treating Physician/Extender: Skipper Cliche in Treatment: 2 Education Assessment Education Provided To: Patient Education Topics Provided Wound/Skin Impairment: Handouts: Caring for Your Ulcer Methods: Demonstration, Explain/Verbal Responses: State content correctly Electronic Signature(s) Signed: 01/11/2021 12:18:06 PM By: Gretta Cool, BSN, RN, CWS, Kim RN,  BSN Entered By: Gretta Cool, BSN, RN, CWS, Kim on 01/10/2021 15:58:07 [name], [name] ([id number]) -------------------------------------------------------------------------------- Wound Assessment Details Patient Name: [name], [name] Date of Service: 01/10/2021 2:45 PM Medical Record Number: [id number] Patient Account Number: [id number] Date of Birth/Sex: February 20, 1932 (87 y.o. F) Treating RN: Cornell Barman Primary Care Latania Bascomb: Kelton Pillar Other Clinician: Referring Josslin Sanjuan: Kelton Pillar Treating Kadir Azucena/Extender: Jeri Cos Weeks in Treatment: 2 Wound Status Wound Number: 1 Primary Etiology: Pressure Ulcer Wound Location: Right Calcaneus Wound Status: Open Wounding Event: Pressure Injury Date Acquired: 12/25/2020 Weeks Of Treatment: 2 Clustered Wound: No Photos Photo Uploaded By: Gretta Cool, BSN, RN, CWS, Kim on 01/11/2021 12:19:05 Wound Measurements Length: (cm) 0.3 Width: (cm) 0.5 Depth: (cm) 0.2 Area: (cm) 0.118 Volume: (cm) 0.024 % Reduction in Area: 53% % Reduction in Volume: 52% Wound Description Classification: Category/Stage III Exudate Amount: Medium Exudate Type: Serosanguineous Exudate Color: red, brown Treatment Notes Wound #1 (Calcaneus) Wound Laterality: Right Cleanser Normal Saline Discharge Instruction: Wash your hands with soap and water. Remove old dressing, discard into plastic bag and place into trash. Cleanse the wound with Normal Saline prior to applying a clean dressing using gauze sponges, not tissues or cotton balls. Do not scrub or use excessive force. Pat dry using gauze sponges, not tissue or cotton balls. Peri-Wound Care Topical Primary Dressing Prisma 4.34 (in) Discharge Instruction: Moisten w/normal saline or sterile water; Cover wound as directed. Do not remove from wound bed. Zetuvit Plus Silicone Border Dressing 5x5 (in/in) Stjames, Samariyah E. ([id number]) Discharge Instruction: Secure the collagen Secondary Dressing Secured With Compression  Wrap Compression Stockings Add-Ons Electronic Signature(s) Signed: 01/11/2021 12:18:06 PM By: Gretta Cool, BSN, RN, CWS, Kim RN, BSN Entered By: Gretta Cool, BSN, RN, CWS, Kim on 01/10/2021 15:33:27 [name], [name] ([id number]) -------------------------------------------------------------------------------- Wound Assessment Details Patient Name: [name], [name] Date of Service: 01/10/2021 2:45 PM Medical Record Number: [id number] Patient Account Number: [id number] Date of Birth/Sex: 1931/04/26 (87 y.o. F) Treating RN: Cornell Barman Primary Care Tanaka Gillen: Kelton Pillar Other Clinician: Referring Tanny Harnack: Kelton Pillar Treating Kerrilynn Derenzo/Extender: Jeri Cos Weeks in Treatment: 2 Wound Status Wound Number: 2 Primary Etiology: Trauma, Other Wound Location: Left Gluteus Wound Status: Open Wounding Event: Trauma Comorbid Hypertension, Type II Diabetes, Osteoarthritis, History: Dementia Date Acquired: 01/07/2021 Weeks Of Treatment: 0 Clustered Wound: No Photos Wound Measurements Length: (cm) 1 Width: (cm) 0.5 Depth: (cm) 0.1 Area: (cm) 0.393 Volume: (cm) 0.039 % Reduction in Area: 0% % Reduction in Volume: 0% Epithelialization: None Tunneling: No Undermining: No Wound Description Classification: Full Thickness Without Exposed Support Structu Exudate Amount: Small Exudate Type: Sanguinous Exudate Color: red res Foul Odor After Cleansing: No Slough/Fibrino No Wound Bed Granulation Amount: Small (1-33%) Exposed Structure Granulation Quality: Pink Fascia Exposed: No Necrotic Amount: Large (67-100%) Fat Layer (Subcutaneous Tissue) Exposed: Yes Necrotic Quality: Adherent Slough Tendon Exposed: No Muscle Exposed: No Joint Exposed: No Bone Exposed: No Treatment Notes Wound #2 (Gluteus) Wound Laterality: Left Cleanser Peri-Wound Care AandD Ointment Discharge Instruction: Apply AandD Ointment as directed Olkowski, [name] ([id number]) Topical Primary Dressing Secondary Dressing Secured  With Compression Wrap Compression Stockings Add-Ons Electronic Signature(s) Signed: 01/11/2021 12:18:06 PM By: Gretta Cool, BSN, RN, CWS, Kim RN, BSN Entered By: Gretta Cool, BSN, RN, CWS, Kim on  01/10/2021 15:34:46 Manternach, [name] ([id number]) -------------------------------------------------------------------------------- Vitals Details Patient Name: [name] Date of Service: 01/10/2021 2:45 PM Medical Record Number: [id number] Patient Account Number: [id number] Date of Birth/Sex: [birth year] (87 y.o. F) Treating RN: Cornell Barman Primary Care Izzac Rockett: Kelton Pillar Other Clinician: Referring Savyon Loken: Kelton Pillar Treating Camiyah Friberg/Extender: Skipper Cliche in Treatment: 2 Vital Signs Time Taken: 15:18 Temperature (F): 98.5 Height (in): 63 Pulse (bpm): 62 Weight (lbs): 196 Respiratory Rate (breaths/min): 16 Body Mass Index (BMI): 34.7 Blood Pressure (mmHg): 141/73 Reference Range: 80 - 120 mg / dl Electronic Signature(s) Signed: 01/11/2021 12:18:06 PM By: Gretta Cool, BSN, RN, CWS, Kim RN, BSN Entered By: Gretta Cool, BSN, RN, CWS, Kim on 01/10/2021 15:19:09

## 2021-01-10 NOTE — Progress Notes (Addendum)
[name] ([id number]) Visit Report for 01/10/2021 Chief Complaint Document Details Patient Name: [name], [name]. Date of Service: 01/10/2021 2:45 PM Medical Record Number: [id number] Patient Account Number: [id number] Date of Birth/Sex: 12-17-1931 (87 y.o. F) Treating RN: Cornell Barman Primary Care Provider: Kelton Pillar Other Clinician: Referring Provider: Kelton Pillar Treating Provider/Extender: Skipper Cliche in Treatment: 2 Information Obtained from: Patient Chief Complaint Right heel pressure ulcer and gluteal abrasions Electronic Signature(s) Signed: 01/10/2021 3:51:12 PM By: Worthy Keeler PA-C Previous Signature: 01/10/2021 3:04:38 PM Version By: Worthy Keeler PA-C Entered By: Worthy Keeler on 01/10/2021 15:51:12 Coller, [name] ([id number]) -------------------------------------------------------------------------------- Debridement Details Patient Name: [name] Date of Service: 01/10/2021 2:45 PM Medical Record Number: [id number] Patient Account Number: [id number] Date of Birth/Sex: 1931-03-31 (87 y.o. F) Treating RN: Cornell Barman Primary Care Provider: Kelton Pillar Other Clinician: Referring Provider: Kelton Pillar Treating Provider/Extender: Skipper Cliche in Treatment: 2 Debridement Performed for Wound #1 Right Calcaneus Assessment: Performed By: Physician Tommie Sams., PA-C Debridement Type: Debridement Level of Consciousness (Pre- Awake and Alert procedure): Pre-procedure Verification/Time Out Yes - 15:47 Taken: Total Area Debrided (L x W): 0.3 (cm) x 0.5 (cm) = 0.15 (cm) Tissue and other material Viable, Non-Viable, Slough, Subcutaneous, Slough debrided: Level: Skin/Subcutaneous Tissue Debridement Description: Excisional Instrument: Curette Bleeding: Minimum Hemostasis Achieved: Pressure Response to Treatment: Procedure was tolerated well Level of Consciousness (Post- Awake and Alert procedure): Post Debridement Measurements of Total  Wound Length: (cm) 0.3 Stage: Category/Stage III Width: (cm) 0.5 Depth: (cm) 0.3 Volume: (cm) 0.035 Character of Wound/Ulcer Post Debridement: Stable Post Procedure Diagnosis Same as Pre-procedure Electronic Signature(s) Signed: 01/10/2021 5:06:33 PM By: Worthy Keeler PA-C Signed: 01/11/2021 12:18:06 PM By: Gretta Cool, BSN, RN, CWS, Kim RN, BSN Entered By: Gretta Cool, BSN, RN, CWS, Kim on 01/10/2021 15:48:05 Allender, [name] ([id number]) -------------------------------------------------------------------------------- HPI Details Patient Name: [name] Date of Service: 01/10/2021 2:45 PM Medical Record Number: [id number] Patient Account Number: [id number] Date of Birth/Sex: [birth year] (87 y.o. F) Treating RN: Cornell Barman Primary Care Provider: Kelton Pillar Other Clinician: Referring Provider: Kelton Pillar Treating Provider/Extender: Skipper Cliche in Treatment: 2 History of Present Illness HPI Description: 12/25/20 upon evaluation today patient presents for a wound on her heel which has been present for about 1 and half months or so according to her daughter. The patient does have dementia. With that being said her daughter is the primary caregiver and seems to be doing an excellent job. She had a fairly significant wound on the heel with eschar covering however this was lifting up and it appears to be mostly healed underneath the biggest issue is on the posterior aspect of the heel where she has a small slit of an opening in the Achilles region which is actually what is mainly painful for her today. We do not have a hemoglobin A1c as the patient actually is seen by a provider outside of the [facility] system so I am not able to look this up. With that being said I do not see any evidence of active infection and I do think that she is actually doing quite well I think her daughter is doing a great job taking care of the wound in particular. With regard to her past medical history she does have  dementia and diabetes mellitus type 2 but otherwise no major medical problems. She did have an x-ray performed 11/27/2020 which was negative for any signs of osteomyelitis. 01/03/2021 upon evaluation today patient appears to be  doing well with regard to her heel ulcer. She has been tolerating the dressing changes without complication the collagen seems to be doing a good job for her. I do not see any signs of infection at this point. Unfortunately the patient is stated to have a wound on her bottom although her daughter is not sure how bad this really is next week we will get a get her into a room with a bed so that we can actually check this out I do not want things to get worse unnecessarily. 01/10/2021 upon evaluation today patient's wound actually appears to be doing well in regard to her heel there is some slough noted I Georgina Peer clear this away with some sharp debridement today. With regard to the gluteal region there is some abrasion noted here. This seems to be very superficial and I think it is from her scratching mainly they have been using Desitin I think she could benefit from possibly utilizing some AandE ointment which I think could be of benefit for her. Fortunately there does not appear to be any signs of active infection systemically which is great news. Electronic Signature(s) Signed: 01/10/2021 3:51:24 PM By: Worthy Keeler PA-C Entered By: Worthy Keeler on 01/10/2021 15:51:23 Ponder, [name] ([id number]) -------------------------------------------------------------------------------- Physical Exam Details Patient Name: [name] Date of Service: 01/10/2021 2:45 PM Medical Record Number: [id number] Patient Account Number: [id number] Date of Birth/Sex: [birth year] (87 y.o. F) Treating RN: Cornell Barman Primary Care Provider: Kelton Pillar Other Clinician: Referring Provider: Kelton Pillar Treating Provider/Extender: Jeri Cos Weeks in Treatment:  2 Constitutional Well-nourished and well-hydrated in no acute distress. Respiratory normal breathing without difficulty. Psychiatric this patient is able to make decisions and demonstrates good insight into disease process. Alert and Oriented x 3. pleasant and cooperative. Notes And epithelization at this point. Fortunately there does not appear to be any signs of active infection systemically which is great news and overall I am extremely pleased with where we stand today. No fevers, chills, nausea, vomiting, or diarrhea. Upon inspection patient's wound bed actually showed signs of good granulation Electronic Signature(s) Signed: 01/10/2021 3:51:47 PM By: Worthy Keeler PA-C Entered By: Worthy Keeler on 01/10/2021 15:51:46 Termine, [name] ([id number]) -------------------------------------------------------------------------------- Physician Orders Details Patient Name: [name] Date of Service: 01/10/2021 2:45 PM Medical Record Number: [id number] Patient Account Number: [id number] Date of Birth/Sex: 1931-12-26 (87 y.o. F) Treating RN: Cornell Barman Primary Care Provider: Kelton Pillar Other Clinician: Referring Provider: Kelton Pillar Treating Provider/Extender: Skipper Cliche in Treatment: 2 Verbal / Phone Orders: No Diagnosis Coding ICD-10 Coding Code Description [phone] Pressure ulcer of right heel, stage 3 E11.622 Type 2 diabetes mellitus with other skin ulcer F01.50 Vascular dementia without behavioral disturbance Follow-up Appointments o Return Appointment in 1 week. Bathing/ Shower/ Hygiene o May shower; gently cleanse wound with antibacterial soap, rinse and pat dry prior to dressing wounds Off-Loading o Other: - Patient to purchase slippers with rubber soles Wound Treatment Wound #1 - Calcaneus Wound Laterality: Right Cleanser: Normal Saline 3 x Per Week/30 Days Discharge Instructions: Wash your hands with soap and water. Remove old dressing, discard into  plastic bag and place into trash. Cleanse the wound with Normal Saline prior to applying a clean dressing using gauze sponges, not tissues or cotton balls. Do not scrub or use excessive force. Pat dry using gauze sponges, not tissue or cotton balls. Primary Dressing: Prisma 4.34 (in) 3 x Per Week/30 Days Discharge Instructions: Moisten w/normal saline or sterile  water; Cover wound as directed. Do not remove from wound bed. Primary Dressing: Zetuvit Plus Silicone Border Dressing 5x5 (in/in) (Generic) 3 x Per Week/30 Days Discharge Instructions: Secure the collagen Wound #2 - Gluteus Wound Laterality: Left Peri-Wound Care: AandD Ointment Discharge Instructions: Apply AandD Ointment as directed Electronic Signature(s) Signed: 01/10/2021 5:06:33 PM By: Worthy Keeler PA-C Signed: 01/11/2021 12:18:06 PM By: Gretta Cool, BSN, RN, CWS, Kim RN, BSN Entered By: Gretta Cool, BSN, RN, CWS, Kim on 01/10/2021 15:44:50 Mcbain, [name] ([id number]) -------------------------------------------------------------------------------- Problem List Details Patient Name: [name] Date of Service: 01/10/2021 2:45 PM Medical Record Number: [id number] Patient Account Number: [id number] Date of Birth/Sex: 10-13-1931 (87 y.o. F) Treating RN: Cornell Barman Primary Care Provider: Kelton Pillar Other Clinician: Referring Provider: Kelton Pillar Treating Provider/Extender: Skipper Cliche in Treatment: 2 Active Problems ICD-10 Encounter Code Description Active Date MDM Diagnosis L89.613 Pressure ulcer of right heel, stage 3 12/25/2020 No Yes S30.810A Abrasion of lower back and pelvis, initial encounter 01/10/2021 No Yes E11.622 Type 2 diabetes mellitus with other skin ulcer 12/25/2020 No Yes F01.50 Vascular dementia without behavioral disturbance 12/25/2020 No Yes Inactive Problems Resolved Problems Electronic Signature(s) Signed: 01/10/2021 3:50:45 PM By: Worthy Keeler PA-C Previous Signature: 01/10/2021 3:04:30 PM  Version By: Worthy Keeler PA-C Entered By: Worthy Keeler on 01/10/2021 15:50:44 Zafar, [name] ([id number]) -------------------------------------------------------------------------------- Progress Note Details Patient Name: [name] Date of Service: 01/10/2021 2:45 PM Medical Record Number: [id number] Patient Account Number: [id number] Date of Birth/Sex: 06/28/1931 (87 y.o. F) Treating RN: Cornell Barman Primary Care Provider: Kelton Pillar Other Clinician: Referring Provider: Kelton Pillar Treating Provider/Extender: Skipper Cliche in Treatment: 2 Subjective Chief Complaint Information obtained from Patient Right heel pressure ulcer and gluteal abrasions History of Present Illness (HPI) 12/25/20 upon evaluation today patient presents for a wound on her heel which has been present for about 1 and half months or so according to her daughter. The patient does have dementia. With that being said her daughter is the primary caregiver and seems to be doing an excellent job. She had a fairly significant wound on the heel with eschar covering however this was lifting up and it appears to be mostly healed underneath the biggest issue is on the posterior aspect of the heel where she has a small slit of an opening in the Achilles region which is actually what is mainly painful for her today. We do not have a hemoglobin A1c as the patient actually is seen by a provider outside of the [facility] system so I am not able to look this up. With that being said I do not see any evidence of active infection and I do think that she is actually doing quite well I think her daughter is doing a great job taking care of the wound in particular. With regard to her past medical history she does have dementia and diabetes mellitus type 2 but otherwise no major medical problems. She did have an x-ray performed 11/27/2020 which was negative for any signs of osteomyelitis. 01/03/2021 upon evaluation today patient  appears to be doing well with regard to her heel ulcer. She has been tolerating the dressing changes without complication the collagen seems to be doing a good job for her. I do not see any signs of infection at this point. Unfortunately the patient is stated to have a wound on her bottom although her daughter is not sure how bad this really is next week we will get a get her  into a room with a bed so that we can actually check this out I do not want things to get worse unnecessarily. 01/10/2021 upon evaluation today patient's wound actually appears to be doing well in regard to her heel there is some slough noted I Georgina Peer clear this away with some sharp debridement today. With regard to the gluteal region there is some abrasion noted here. This seems to be very superficial and I think it is from her scratching mainly they have been using Desitin I think she could benefit from possibly utilizing some AandE ointment which I think could be of benefit for her. Fortunately there does not appear to be any signs of active infection systemically which is great news. Objective Constitutional Well-nourished and well-hydrated in no acute distress. Vitals Time Taken: 3:18 PM, Height: 63 in, Weight: 196 lbs, BMI: 34.7, Temperature: 98.5 F, Pulse: 62 bpm, Respiratory Rate: 16 breaths/min, Blood Pressure: 141/73 mmHg. Respiratory normal breathing without difficulty. Psychiatric this patient is able to make decisions and demonstrates good insight into disease process. Alert and Oriented x 3. pleasant and cooperative. General Notes: And epithelization at this point. Fortunately there does not appear to be any signs of active infection systemically which is great news and overall I am extremely pleased with where we stand today. No fevers, chills, nausea, vomiting, or diarrhea. Upon inspection patient's wound bed actually showed signs of good granulation Integumentary (Hair, Skin) Wound #1 status is Open.  Original cause of wound was Pressure Injury. The date acquired was: 12/25/2020. The wound has been in treatment 2 weeks. The wound is located on the Right Calcaneus. The wound measures 0.3cm length x 0.5cm width x 0.2cm depth; 0.118cm^2 area and 0.024cm^3 volume. There is a medium amount of serosanguineous drainage noted. Wound #2 status is Open. Original cause of wound was Trauma. The date acquired was: 01/07/2021. The wound is located on the Left Gluteus. The wound measures 1cm length x 0.5cm width x 0.1cm depth; 0.393cm^2 area and 0.039cm^3 volume. There is Fat Layer (Subcutaneous Tissue) exposed. There is no tunneling or undermining noted. There is a small amount of sanguinous drainage noted. There is small (1-33%) pink Raborn, Arta E. ([id number]) granulation within the wound bed. There is a large (67-100%) amount of necrotic tissue within the wound bed including Adherent Slough. Assessment Active Problems ICD-10 Pressure ulcer of right heel, stage 3 Abrasion of lower back and pelvis, initial encounter Type 2 diabetes mellitus with other skin ulcer Vascular dementia without behavioral disturbance Procedures Wound #1 Pre-procedure diagnosis of Wound #1 is a Pressure Ulcer located on the Right Calcaneus . There was a Excisional Skin/Subcutaneous Tissue Debridement with a total area of 0.15 sq cm performed by Tommie Sams., PA-C. With the following instrument(s): Curette to remove Viable and Non-Viable tissue/material. Material removed includes Subcutaneous Tissue and Slough and. No specimens were taken. A time out was conducted at 15:47, prior to the start of the procedure. A Minimum amount of bleeding was controlled with Pressure. The procedure was tolerated well. Post Debridement Measurements: 0.3cm length x 0.5cm width x 0.3cm depth; 0.035cm^3 volume. Post debridement Stage noted as Category/Stage III. Character of Wound/Ulcer Post Debridement is stable. Post procedure Diagnosis Wound #1:  Same as Pre-Procedure Plan Follow-up Appointments: Return Appointment in 1 week. Bathing/ Shower/ Hygiene: May shower; gently cleanse wound with antibacterial soap, rinse and pat dry prior to dressing wounds Off-Loading: Other: - Patient to purchase slippers with rubber soles WOUND #1: - Calcaneus Wound Laterality: Right Cleanser: Normal  Saline 3 x Per Week/30 Days Discharge Instructions: Wash your hands with soap and water. Remove old dressing, discard into plastic bag and place into trash. Cleanse the wound with Normal Saline prior to applying a clean dressing using gauze sponges, not tissues or cotton balls. Do not scrub or use excessive force. Pat dry using gauze sponges, not tissue or cotton balls. Primary Dressing: Prisma 4.34 (in) 3 x Per Week/30 Days Discharge Instructions: Moisten w/normal saline or sterile water; Cover wound as directed. Do not remove from wound bed. Primary Dressing: Zetuvit Plus Silicone Border Dressing 5x5 (in/in) (Generic) 3 x Per Week/30 Days Discharge Instructions: Secure the collagen WOUND #2: - Gluteus Wound Laterality: Left Peri-Wound Care: AandD Ointment Discharge Instructions: Apply AandD Ointment as directed 1. I am going to recommend currently that we going to continue with the silver collagen to the heel which I think is a good way to go. 2. I am also can recommend that we have the patient utilize the Zetuvit border foam dressing to cover with regard to the heel. 3. I would recommend AandD ointment which I think will help to keep the skin integrity intact. I think this is an excellent option. We will see patient back for reevaluation in 1 week here in the clinic. If anything worsens or changes patient will contact our office for additional recommendations. Electronic Signature(s) Signed: 01/10/2021 3:53:08 PM By: Worthy Keeler PA-C Entered By: Worthy Keeler on 01/10/2021 15:53:07 Oestreicher, [name] ([id number]) Rathje, [name]  ([id number]) -------------------------------------------------------------------------------- SuperBill Details Patient Name: [name] Date of Service: 01/10/2021 Medical Record Number: [id number] Patient Account Number: [id number] Date of Birth/Sex: [birth year] (87 y.o. F) Treating RN: Cornell Barman Primary Care Provider: Kelton Pillar Other Clinician: Referring Provider: Kelton Pillar Treating Provider/Extender: Skipper Cliche in Treatment: 2 Diagnosis Coding ICD-10 Codes Code Description [phone] Pressure ulcer of right heel, stage 3 S30.810A Abrasion of lower back and pelvis, initial encounter E11.622 Type 2 diabetes mellitus with other skin ulcer F01.50 Vascular dementia without behavioral disturbance Facility Procedures CPT4 Code: [id number] Description: 72094 - DEB SUBQ TISSUE 20 SQ CM/< Modifier: Quantity: 1 CPT4 Code: Description: ICD-10 Diagnosis Description L89.613 Pressure ulcer of right heel, stage 3 Modifier: Quantity: Physician Procedures CPT4 Code: [id number] Description: 66294 - WC PHYS LEVEL 4 - EST PT Modifier: 25 Quantity: 1 CPT4 Code: Description: ICD-10 Diagnosis Description L89.613 Pressure ulcer of right heel, stage 3 S30.810A Abrasion of lower back and pelvis, initial encounter E11.622 Type 2 diabetes mellitus with other skin ulcer F01.50 Vascular dementia without behavioral  disturbance Modifier: Quantity: CPT4 Code: [id number] Description: 35465 - WC PHYS SUBQ TISS 20 SQ CM Modifier: Quantity: 1 CPT4 Code: Description: ICD-10 Diagnosis Description L89.613 Pressure ulcer of right heel, stage 3 Modifier: Quantity: Electronic Signature(s) Signed: 01/10/2021 3:53:46 PM By: Worthy Keeler PA-C Previous Signature: 01/10/2021 3:53:23 PM Version By: Worthy Keeler PA-C Entered By: Worthy Keeler on 01/10/2021 15:53:45

## 2021-01-16 ENCOUNTER — Other Ambulatory Visit: Payer: Medicare Other | Admitting: *Deleted

## 2021-01-16 ENCOUNTER — Other Ambulatory Visit: Payer: Medicare Other

## 2021-01-16 ENCOUNTER — Other Ambulatory Visit: Payer: Self-pay

## 2021-01-16 DIAGNOSIS — Z515 Encounter for palliative care: Secondary | ICD-10-CM

## 2021-01-17 ENCOUNTER — Other Ambulatory Visit: Payer: Self-pay

## 2021-01-17 ENCOUNTER — Encounter: Payer: Medicare Other | Admitting: Physician Assistant

## 2021-01-17 DIAGNOSIS — L89613 Pressure ulcer of right heel, stage 3: Secondary | ICD-10-CM | POA: Diagnosis not present

## 2021-01-17 DIAGNOSIS — E1169 Type 2 diabetes mellitus with other specified complication: Secondary | ICD-10-CM | POA: Diagnosis not present

## 2021-01-17 DIAGNOSIS — E11622 Type 2 diabetes mellitus with other skin ulcer: Secondary | ICD-10-CM | POA: Diagnosis not present

## 2021-01-17 DIAGNOSIS — E1151 Type 2 diabetes mellitus with diabetic peripheral angiopathy without gangrene: Secondary | ICD-10-CM | POA: Diagnosis not present

## 2021-01-17 NOTE — Progress Notes (Signed)
[name] ([id number]) Visit Report for 01/17/2021 Arrival Information Details Patient Name: [name], [name] Date of Service: 01/17/2021 2:45 PM Medical Record Number: [id number] Patient Account Number: [id number] Date of Birth/Sex: 1931-07-02 (87 y.o. F) Treating RN: Cornell Barman Primary Care Evelene Roussin: Kelton Pillar Other Clinician: Referring Jakaden Ouzts: Kelton Pillar Treating Lorella Gomez/Extender: Skipper Cliche in Treatment: 3 Visit Information History Since Last Visit Pain Present Now: No Patient Arrived: Wheel Chair Arrival Time: 15:14 Accompanied By: daughter Transfer Assistance: Other Patient Identification Verified: Yes Secondary Verification Process Completed: Yes Patient Has Alerts: Yes Patient Alerts: Type II Diabetic Notes two-person transfer Electronic Signature(s) Signed: 01/17/2021 4:08:46 PM By: Gretta Cool, BSN, RN, CWS, Kim RN, BSN Entered By: Gretta Cool, BSN, RN, CWS, Kim on 01/17/2021 16:08:46 [name] ([id number]) -------------------------------------------------------------------------------- Clinic Level of Care Assessment Details Patient Name: [name] Date of Service: 01/17/2021 2:45 PM Medical Record Number: [id number] Patient Account Number: [id number] Date of Birth/Sex: December 23, 1931 (87 y.o. F) Treating RN: Cornell Barman Primary Care Marcayla Budge: Kelton Pillar Other Clinician: Referring Carra Brindley: Kelton Pillar Treating Zula Hovsepian/Extender: Skipper Cliche in Treatment: 3 Clinic Level of Care Assessment Items TOOL 4 Quantity Score []  - Use when only an EandM is performed on FOLLOW-UP visit 0 ASSESSMENTS - Nursing Assessment / Reassessment X - Reassessment of Co-morbidities (includes updates in patient status) 1 10 X- 1 5 Reassessment of Adherence to Treatment Plan ASSESSMENTS - Wound and Skin Assessment / Reassessment X - Simple Wound Assessment / Reassessment - one wound 1 5 []  - 0 Complex Wound Assessment / Reassessment - multiple wounds []  -  0 Dermatologic / Skin Assessment (not related to wound area) ASSESSMENTS - Focused Assessment []  - Circumferential Edema Measurements - multi extremities 0 []  - 0 Nutritional Assessment / Counseling / Intervention []  - 0 Lower Extremity Assessment (monofilament, tuning fork, pulses) []  - 0 Peripheral Arterial Disease Assessment (using hand held doppler) ASSESSMENTS - Ostomy and/or Continence Assessment and Care []  - Incontinence Assessment and Management 0 []  - 0 Ostomy Care Assessment and Management (repouching, etc.) PROCESS - Coordination of Care X - Simple Patient / Family Education for ongoing care 1 15 []  - 0 Complex (extensive) Patient / Family Education for ongoing care []  - 0 Staff obtains Programmer, systems, Records, Test Results / Process Orders []  - 0 Staff telephones HHA, Nursing Homes / Clarify orders / etc []  - 0 Routine Transfer to another Facility (non-emergent condition) []  - 0 Routine Hospital Admission (non-emergent condition) []  - 0 New Admissions / Biomedical engineer / Ordering NPWT, Apligraf, etc. []  - 0 Emergency Hospital Admission (emergent condition) X- 1 10 Simple Discharge Coordination []  - 0 Complex (extensive) Discharge Coordination PROCESS - Special Needs []  - Pediatric / Minor Patient Management 0 []  - 0 Isolation Patient Management []  - 0 Hearing / Language / Visual special needs []  - 0 Assessment of Community assistance (transportation, D/C planning, etc.) []  - 0 Additional assistance / Altered mentation []  - 0 Support Surface(s) Assessment (bed, cushion, seat, etc.) INTERVENTIONS - Wound Cleansing / Measurement [name], [name] ([id number]) X- 1 5 Simple Wound Cleansing - one wound []  - 0 Complex Wound Cleansing - multiple wounds X- 1 5 Wound Imaging (photographs - any number of wounds) []  - 0 Wound Tracing (instead of photographs) []  - 0 Simple Wound Measurement - one wound []  - 0 Complex Wound Measurement - multiple  wounds INTERVENTIONS - Wound Dressings X - Small Wound Dressing one or multiple wounds 1 10 []  - 0 Medium Wound Dressing  one or multiple wounds []  - 0 Large Wound Dressing one or multiple wounds []  - 0 Application of Medications - topical []  - 0 Application of Medications - injection INTERVENTIONS - Miscellaneous []  - External ear exam 0 []  - 0 Specimen Collection (cultures, biopsies, blood, body fluids, etc.) []  - 0 Specimen(s) / Culture(s) sent or taken to Lab for analysis X- 1 10 Patient Transfer (multiple staff / Civil Service fast streamer / Similar devices) []  - 0 Simple Staple / Suture removal (25 or less) []  - 0 Complex Staple / Suture removal (26 or more) []  - 0 Hypo / Hyperglycemic Management (close monitor of Blood Glucose) []  - 0 Ankle / Brachial Index (ABI) - do not check if billed separately X- 1 5 Vital Signs Has the patient been seen at the hospital within the last three years: Yes Total Score: 80 Level Of Care: New/Established - Level 3 Electronic Signature(s) Signed: 01/17/2021 4:13:36 PM By: Gretta Cool, BSN, RN, CWS, Kim RN, BSN Entered By: Gretta Cool, BSN, RN, CWS, Kim on 01/17/2021 16:10:06 [name] ([id number]) -------------------------------------------------------------------------------- Encounter Discharge Information Details Patient Name: [name], [name] Date of Service: 01/17/2021 2:45 PM Medical Record Number: [id number] Patient Account Number: [id number] Date of Birth/Sex: [birth year] (87 y.o. F) Treating RN: Cornell Barman Primary Care Shaquita Fort: Kelton Pillar Other Clinician: Referring Kamyiah Colantonio: Kelton Pillar Treating Amaris Garrette/Extender: Skipper Cliche in Treatment: 3 Encounter Discharge Information Items Discharge Condition: Stable Ambulatory Status: Wheelchair Discharge Destination: Home Transportation: Private Auto Accompanied By: daughter Schedule Follow-up Appointment: Yes Clinical Summary of Care: Electronic Signature(s) Signed: 01/17/2021 4:11:23 PM  By: Gretta Cool, BSN, RN, CWS, Kim RN, BSN Entered By: Gretta Cool, BSN, RN, CWS, Kim on 01/17/2021 16:11:23 Nardo, [name] ([id number]) -------------------------------------------------------------------------------- Lower Extremity Assessment Details Patient Name: Bazin, [name] Date of Service: 01/17/2021 2:45 PM Medical Record Number: [id number] Patient Account Number: [id number] Date of Birth/Sex: 14-Sep-1931 (87 y.o. F) Treating RN: Cornell Barman Primary Care Lindaann Gradilla: Kelton Pillar Other Clinician: Referring Trease Bremner: Kelton Pillar Treating Lonnie Rosado/Extender: Skipper Cliche in Treatment: 3 Vascular Assessment Pulses: Dorsalis Pedis Palpable: [Right:Yes] Electronic Signature(s) Signed: 01/17/2021 4:13:36 PM By: Gretta Cool, BSN, RN, CWS, Kim RN, BSN Entered By: Gretta Cool, BSN, RN, CWS, Kim on 01/17/2021 15:34:17 Spooner, [name] ([id number]) -------------------------------------------------------------------------------- Multi Wound Chart Details Patient Name: [name] Date of Service: 01/17/2021 2:45 PM Medical Record Number: [id number] Patient Account Number: [id number] Date of Birth/Sex: 04/01/1931 (87 y.o. F) Treating RN: Cornell Barman Primary Care Rik Wadel: Kelton Pillar Other Clinician: Referring Alireza Pollack: Kelton Pillar Treating Nevaan Bunton/Extender: Skipper Cliche in Treatment: 3 Vital Signs Height(in): 63 Pulse(bpm): 72 Weight(lbs): 196 Blood Pressure(mmHg): 141/68 Body Mass Index(BMI): 35 Temperature(F): 98.4 Respiratory Rate(breaths/min): 16 Photos: [1:No Photos] [2:No Photos] [N/A:N/A] Wound Location: [1:Right Calcaneus] [2:Left Gluteus] [N/A:N/A] Wounding Event: [1:Pressure Injury] [2:Trauma] [N/A:N/A] Primary Etiology: [1:Pressure Ulcer] [2:Trauma, Other] [N/A:N/A] Date Acquired: [1:12/25/2020] [2:01/07/2021] [N/A:N/A] Weeks of Treatment: [1:3] [2:1] [N/A:N/A] Wound Status: [1:Open] [2:Healed - Epithelialized] [N/A:N/A] Measurements L x W x D (cm) [1:0.2x0.4x0.1] [2:0x0x0]  [N/A:N/A] Area (cm) : [1:0.063] [2:0] [N/A:N/A] Volume (cm) : [1:0.006] [2:0] [N/A:N/A] % Reduction in Area: [1:74.90%] [2:100.00%] [N/A:N/A] % Reduction in Volume: [1:88.00%] [2:100.00%] [N/A:N/A] Classification: [1:Category/Stage III] [2:Full Thickness Without Exposed Support Structures] [N/A:N/A] Exudate Amount: [1:Medium] [2:Small] [N/A:N/A] Exudate Type: [1:Serosanguineous red, brown] [2:Sanguinous red] [N/A:N/A N/A] Treatment Notes Electronic Signature(s) Signed: 01/17/2021 4:09:07 PM By: Gretta Cool, BSN, RN, CWS, Kim RN, BSN Entered By: Gretta Cool, BSN, RN, CWS, Kim on 01/17/2021 16:09:06 Ormand, [name] ([id number]) -------------------------------------------------------------------------------- Multi-Disciplinary Care Plan Details Patient Name: Weissmann, [name]  Date of Service: 01/17/2021 2:45 PM Medical Record Number: [id number] Patient Account Number: [id number] Date of Birth/Sex: Oct 19, 1931 (87 y.o. F) Treating RN: Cornell Barman Primary Care Maki Sweetser: Kelton Pillar Other Clinician: Referring Sybol Morre: Kelton Pillar Treating Zaidan Keeble/Extender: Skipper Cliche in Treatment: 3 Active Inactive Orientation to the Wound Care Program Nursing Diagnoses: Knowledge deficit related to the wound healing center program Goals: Patient/caregiver will verbalize understanding of the [facility] Program Date Initiated: 12/25/2020 Target Resolution Date: 12/25/2020 Goal Status: Active Interventions: Provide education on orientation to the wound center Notes: Pressure Nursing Diagnoses: Knowledge deficit related to causes and risk factors for pressure ulcer development Knowledge deficit related to management of pressures ulcers Potential for impaired tissue integrity related to pressure, friction, moisture, and shear Goals: Patient will remain free from development of additional pressure ulcers Date Initiated: 12/25/2020 Target Resolution Date: 01/25/2021 Goal Status: Active Patient  will remain free of pressure ulcers Date Initiated: 12/25/2020 Target Resolution Date: 01/25/2021 Goal Status: Active Patient/caregiver will verbalize risk factors for pressure ulcer development Date Initiated: 12/25/2020 Target Resolution Date: 12/25/2020 Goal Status: Active Patient/caregiver will verbalize understanding of pressure ulcer management Date Initiated: 12/25/2020 Target Resolution Date: 12/25/2020 Goal Status: Active Interventions: Assess: immobility, friction, shearing, incontinence upon admission and as needed Assess offloading mechanisms upon admission and as needed Assess potential for pressure ulcer upon admission and as needed Provide education on pressure ulcers Notes: Wound/Skin Impairment Nursing Diagnoses: Impaired tissue integrity Goals: Patient/caregiver will verbalize understanding of skin care regimen Date Initiated: 12/25/2020 Target Resolution Date: 12/25/2020 Goal Status: Active Petillo, [name] ([id number]) Ulcer/skin breakdown will have a volume reduction of 30% by week 4 Date Initiated: 12/25/2020 Target Resolution Date: 01/25/2021 Goal Status: Active Ulcer/skin breakdown will have a volume reduction of 50% by week 8 Date Initiated: 12/25/2020 Target Resolution Date: 02/24/2021 Goal Status: Active Ulcer/skin breakdown will have a volume reduction of 80% by week 12 Date Initiated: 12/25/2020 Target Resolution Date: 03/27/2021 Goal Status: Active Ulcer/skin breakdown will heal within 14 weeks Date Initiated: 12/25/2020 Target Resolution Date: 04/27/2021 Goal Status: Active Interventions: Assess patient/caregiver ability to obtain necessary supplies Assess patient/caregiver ability to perform ulcer/skin care regimen upon admission and as needed Assess ulceration(s) every visit Provide education on ulcer and skin care Treatment Activities: Referred to DME Ritter Helsley for dressing supplies : 12/25/2020 Skin care regimen initiated :  12/25/2020 Notes: Electronic Signature(s) Signed: 01/17/2021 4:08:58 PM By: Gretta Cool, BSN, RN, CWS, Kim RN, BSN Entered By: Gretta Cool, BSN, RN, CWS, Kim on 01/17/2021 16:08:58 Karney, [name] ([id number]) -------------------------------------------------------------------------------- Pain Assessment Details Patient Name: [name] Date of Service: 01/17/2021 2:45 PM Medical Record Number: [id number] Patient Account Number: [id number] Date of Birth/Sex: [birth year] (87 y.o. F) Treating RN: Cornell Barman Primary Care Berton Butrick: Kelton Pillar Other Clinician: Referring Pradeep Beaubrun: Kelton Pillar Treating Jairy Angulo/Extender: Skipper Cliche in Treatment: 3 Active Problems Location of Pain Severity and Description of Pain Patient Has Paino Patient Unable to Respond Site Locations Pain Management and Medication Current Pain Management: Electronic Signature(s) Signed: 01/17/2021 4:13:36 PM By: Gretta Cool, BSN, RN, CWS, Kim RN, BSN Entered By: Gretta Cool, BSN, RN, CWS, Kim on 01/17/2021 15:28:32 Winner, [name] ([id number]) -------------------------------------------------------------------------------- Patient/Caregiver Education Details Patient Name: [name] Date of Service: 01/17/2021 2:45 PM Medical Record Number: [id number] Patient Account Number: [id number] Date of Birth/Gender: 1931-10-08 (87 y.o. F) Treating RN: Cornell Barman Primary Care Physician: Kelton Pillar Other Clinician: Referring Physician: Kelton Pillar Treating Physician/Extender: Skipper Cliche in Treatment: 3 Education Assessment Education Provided To: Patient Education Topics  Provided Pressure: Handouts: Pressure Ulcers: Care and Offloading Methods: Demonstration, Explain/Verbal Responses: State content correctly Wound/Skin Impairment: Handouts: Caring for Your Ulcer Methods: Demonstration, Explain/Verbal Responses: State content correctly Electronic Signature(s) Signed: 01/17/2021 4:13:36 PM By: Gretta Cool, BSN, RN, CWS,  Kim RN, BSN Entered By: Gretta Cool, BSN, RN, CWS, Kim on 01/17/2021 16:10:39 Bedrosian, [name] ([id number]) -------------------------------------------------------------------------------- Wound Assessment Details Patient Name: [name] Date of Service: 01/17/2021 2:45 PM Medical Record Number: [id number] Patient Account Number: [id number] Date of Birth/Sex: August 15, 1931 (87 y.o. F) Treating RN: Cornell Barman Primary Care Chamya Hunton: Kelton Pillar Other Clinician: Referring Marquite Attwood: Kelton Pillar Treating Debbrah Sampedro/Extender: Jeri Cos Weeks in Treatment: 3 Wound Status Wound Number: 1 Primary Etiology: Pressure Ulcer Wound Location: Right Calcaneus Wound Status: Open Wounding Event: Pressure Injury Date Acquired: 12/25/2020 Weeks Of Treatment: 3 Clustered Wound: No Wound Measurements Length: (cm) 0.2 Width: (cm) 0.4 Depth: (cm) 0.1 Area: (cm) 0.063 Volume: (cm) 0.006 % Reduction in Area: 74.9% % Reduction in Volume: 88% Wound Description Classification: Category/Stage III Exudate Amount: Medium Exudate Type: Serosanguineous Exudate Color: red, brown Treatment Notes Wound #1 (Calcaneus) Wound Laterality: Right Cleanser Normal Saline Discharge Instruction: Wash your hands with soap and water. Remove old dressing, discard into plastic bag and place into trash. Cleanse the wound with Normal Saline prior to applying a clean dressing using gauze sponges, not tissues or cotton balls. Do not scrub or use excessive force. Pat dry using gauze sponges, not tissue or cotton balls. Peri-Wound Care Topical Primary Dressing Prisma 4.34 (in) Discharge Instruction: Moisten w/normal saline or sterile water; Cover wound as directed. Do not remove from wound bed. Zetuvit Plus Silicone Border Dressing 5x5 (in/in) Discharge Instruction: Secure the collagen Secondary Dressing Secured With Compression Wrap Compression Stockings Add-Ons Electronic Signature(s) Signed: 01/17/2021 4:13:36 PM  By: Gretta Cool, BSN, RN, CWS, Kim RN, BSN Entered By: Gretta Cool, BSN, RN, CWS, Kim on 01/17/2021 15:33:34 Callens, [name] ([id number]) -------------------------------------------------------------------------------- Wound Assessment Details Patient Name: Stenner, [name] Date of Service: 01/17/2021 2:45 PM Medical Record Number: [id number] Patient Account Number: [id number] Date of Birth/Sex: [birth year] (87 y.o. F) Treating RN: Cornell Barman Primary Care Rishaan Gunner: Kelton Pillar Other Clinician: Referring Jurnei Latini: Kelton Pillar Treating Shaunee Mulkern/Extender: Jeri Cos Weeks in Treatment: 3 Wound Status Wound Number: 2 Primary Etiology: Trauma, Other Wound Location: Left Gluteus Wound Status: Healed - Epithelialized Wounding Event: Trauma Date Acquired: 01/07/2021 Weeks Of Treatment: 1 Clustered Wound: No Wound Measurements Length: (cm) 0 Width: (cm) 0 Depth: (cm) 0 Area: (cm) Volume: (cm) % Reduction in Area: 100% % Reduction in Volume: 100% 0 0 Wound Description Classification: Full Thickness Without Exposed Support Structu Exudate Amount: Small Exudate Type: Sanguinous Exudate Color: red res Treatment Notes Wound #2 (Gluteus) Wound Laterality: Left Cleanser Peri-Wound Care Topical Primary Dressing Secondary Dressing Secured With Compression Wrap Compression Stockings Add-Ons Electronic Signature(s) Signed: 01/17/2021 4:13:36 PM By: Gretta Cool, BSN, RN, CWS, Kim RN, BSN Entered By: Gretta Cool, BSN, RN, CWS, Kim on 01/17/2021 15:33:50 Nifong, [name] ([id number]) -------------------------------------------------------------------------------- [place] Details Patient Name: [name] Date of Service: 01/17/2021 2:45 PM Medical Record Number: [id number] Patient Account Number: [id number] Date of Birth/Sex: August 24, 1931 (87 y.o. F) Treating RN: Cornell Barman Primary Care Artez Regis: Kelton Pillar Other Clinician: Referring Dayna Geurts: Kelton Pillar Treating Annasophia Crocker/Extender: Skipper Cliche in Treatment: 3 Vital Signs Time Taken: 15:28 Temperature (F): 98.4 Height (in): 63 Pulse (bpm): 72 Weight (lbs): 196 Respiratory Rate (breaths/min): 16 Body Mass Index (BMI): 34.7 Blood Pressure (mmHg): 141/68 Reference Range: 80 - 120 mg / dl Electronic  Signature(s) Signed: 01/17/2021 4:13:36 PM By: Gretta Cool, BSN, RN, CWS, Kim RN, BSN Entered By: Gretta Cool, BSN, RN, CWS, Kim on 01/17/2021 [phone]

## 2021-01-17 NOTE — Progress Notes (Addendum)
Gonyea, AMBER WILLIARD ([id number]) Visit Report for 01/17/2021 Chief Complaint Document Details Patient Name: [name], [name]. Date of Service: 01/17/2021 2:45 PM Medical Record Number: [id number] Patient Account Number: [id number] Date of Birth/Sex: 17-Jan-1932 (87 y.o. F) Treating RN: Cornell Barman Primary Care Provider: Kelton Pillar Other Clinician: Referring Provider: Kelton Pillar Treating Provider/Extender: Skipper Cliche in Treatment: 3 Information Obtained from: Patient Chief Complaint Right heel pressure ulcer and gluteal abrasions Electronic Signature(s) Signed: 01/17/2021 3:32:59 PM By: Worthy Keeler PA-C Entered By: Worthy Keeler on 01/17/2021 15:32:58 Goto, [name] ([id number]) -------------------------------------------------------------------------------- HPI Details Patient Name: [name] Date of Service: 01/17/2021 2:45 PM Medical Record Number: [id number] Patient Account Number: [id number] Date of Birth/Sex: Dec 19, 1931 (87 y.o. F) Treating RN: Cornell Barman Primary Care Provider: Kelton Pillar Other Clinician: Referring Provider: Kelton Pillar Treating Provider/Extender: Skipper Cliche in Treatment: 3 History of Present Illness HPI Description: 12/25/20 upon evaluation today patient presents for a wound on her heel which has been present for about 1 and half months or so according to her daughter. The patient does have dementia. With that being said her daughter is the primary caregiver and seems to be doing an excellent job. She had a fairly significant wound on the heel with eschar covering however this was lifting up and it appears to be mostly healed underneath the biggest issue is on the posterior aspect of the heel where she has a small slit of an opening in the Achilles region which is actually what is mainly painful for her today. We do not have a hemoglobin A1c as the patient actually is seen by a provider outside of the [facility] system so I am not able to  look this up. With that being said I do not see any evidence of active infection and I do think that she is actually doing quite well I think her daughter is doing a great job taking care of the wound in particular. With regard to her past medical history she does have dementia and diabetes mellitus type 2 but otherwise no major medical problems. She did have an x-ray performed 11/27/2020 which was negative for any signs of osteomyelitis. 01/03/2021 upon evaluation today patient appears to be doing well with regard to her heel ulcer. She has been tolerating the dressing changes without complication the collagen seems to be doing a good job for her. I do not see any signs of infection at this point. Unfortunately the patient is stated to have a wound on her bottom although her daughter is not sure how bad this really is next week we will get a get her into a room with a bed so that we can actually check this out I do not want things to get worse unnecessarily. 01/10/2021 upon evaluation today patient's wound actually appears to be doing well in regard to her heel there is some slough noted I Georgina Peer clear this away with some sharp debridement today. With regard to the gluteal region there is some abrasion noted here. This seems to be very superficial and I think it is from her scratching mainly they have been using Desitin I think she could benefit from possibly utilizing some AandE ointment which I think could be of benefit for her. Fortunately there does not appear to be any signs of active infection systemically which is great news. 01/17/2021 upon evaluation today patient's wound on the gluteal region actually appears to be healed and everything is doing quite well. I am very pleased  in that regard. With regard to the wound on her heel that is measuring smaller and looking much better and very pleased with what I see as well today. Electronic Signature(s) Signed: 01/17/2021 4:52:07 PM By: Worthy Keeler PA-C Entered By: Worthy Keeler on 01/17/2021 16:52:07 Cly, [name] ([id number]) -------------------------------------------------------------------------------- Physical Exam Details Patient Name: [name] Date of Service: 01/17/2021 2:45 PM Medical Record Number: [id number] Patient Account Number: [id number] Date of Birth/Sex: [birth year] (87 y.o. F) Treating RN: Cornell Barman Primary Care Provider: Kelton Pillar Other Clinician: Referring Provider: Kelton Pillar Treating Provider/Extender: Jeri Cos Weeks in Treatment: 3 Constitutional Well-nourished and well-hydrated in no acute distress. Respiratory normal breathing without difficulty. Psychiatric this patient is able to make decisions and demonstrates good insight into disease process. Alert and Oriented x 3. pleasant and cooperative. Notes Upon inspection patient's wound bed showed signs of good granulation and epithelization at this point. Overall I feel like that the heel is making signs of good improvement and in general very pleased with where we stand today. Electronic Signature(s) Signed: 01/17/2021 4:52:20 PM By: Worthy Keeler PA-C Entered By: Worthy Keeler on 01/17/2021 16:52:20 Caridi, [name] ([id number]) -------------------------------------------------------------------------------- Physician Orders Details Patient Name: [name] Date of Service: 01/17/2021 2:45 PM Medical Record Number: [id number] Patient Account Number: [id number] Date of Birth/Sex: 1931-11-12 (87 y.o. F) Treating RN: Cornell Barman Primary Care Provider: Kelton Pillar Other Clinician: Referring Provider: Kelton Pillar Treating Provider/Extender: Skipper Cliche in Treatment: 3 Verbal / Phone Orders: No Diagnosis Coding ICD-10 Coding Code Description [phone] Pressure ulcer of right heel, stage 3 S30.810A Abrasion of lower back and pelvis, initial encounter E11.622 Type 2 diabetes mellitus with other skin ulcer F01.50  Vascular dementia without behavioral disturbance Follow-up Appointments o Return Appointment in 1 week. Bathing/ Shower/ Hygiene o May shower; gently cleanse wound with antibacterial soap, rinse and pat dry prior to dressing wounds Off-Loading o Other: - Patient to purchase slippers with rubber soles Wound Treatment Wound #1 - Calcaneus Wound Laterality: Right Cleanser: Normal Saline 3 x Per Week/30 Days Discharge Instructions: Wash your hands with soap and water. Remove old dressing, discard into plastic bag and place into trash. Cleanse the wound with Normal Saline prior to applying a clean dressing using gauze sponges, not tissues or cotton balls. Do not scrub or use excessive force. Pat dry using gauze sponges, not tissue or cotton balls. Primary Dressing: Prisma 4.34 (in) 3 x Per Week/30 Days Discharge Instructions: Moisten w/normal saline or sterile water; Cover wound as directed. Do not remove from wound bed. Primary Dressing: Zetuvit Plus Silicone Border Dressing 5x5 (in/in) (Generic) 3 x Per Week/30 Days Discharge Instructions: Secure the collagen Electronic Signature(s) Signed: 01/17/2021 4:09:36 PM By: Gretta Cool, BSN, RN, CWS, Kim RN, BSN Signed: 01/17/2021 5:14:46 PM By: Worthy Keeler PA-C Entered By: Gretta Cool, BSN, RN, CWS, Kim on 01/17/2021 16:09:36 Balla, [name] ([id number]) -------------------------------------------------------------------------------- Problem List Details Patient Name: [name] Date of Service: 01/17/2021 2:45 PM Medical Record Number: [id number] Patient Account Number: [id number] Date of Birth/Sex: 03/05/1932 (87 y.o. F) Treating RN: Cornell Barman Primary Care Provider: Kelton Pillar Other Clinician: Referring Provider: Kelton Pillar Treating Provider/Extender: Skipper Cliche in Treatment: 3 Active Problems ICD-10 Encounter Code Description Active Date MDM Diagnosis [phone] Pressure ulcer of right heel, stage 3 12/25/2020 No  Yes S30.810A Abrasion of lower back and pelvis, initial encounter 01/10/2021 No Yes E11.622 Type 2 diabetes mellitus with other skin ulcer 12/25/2020 No Yes F01.50 Vascular  dementia without behavioral disturbance 12/25/2020 No Yes Inactive Problems Resolved Problems Electronic Signature(s) Signed: 01/17/2021 3:32:50 PM By: Worthy Keeler PA-C Entered By: Worthy Keeler on 01/17/2021 15:32:49 Sager, [name] ([id number]) -------------------------------------------------------------------------------- Progress Note Details Patient Name: [name] Date of Service: 01/17/2021 2:45 PM Medical Record Number: [id number] Patient Account Number: [id number] Date of Birth/Sex: 12/15/1931 (87 y.o. F) Treating RN: Cornell Barman Primary Care Provider: Kelton Pillar Other Clinician: Referring Provider: Kelton Pillar Treating Provider/Extender: Skipper Cliche in Treatment: 3 Subjective Chief Complaint Information obtained from Patient Right heel pressure ulcer and gluteal abrasions History of Present Illness (HPI) 12/25/20 upon evaluation today patient presents for a wound on her heel which has been present for about 1 and half months or so according to her daughter. The patient does have dementia. With that being said her daughter is the primary caregiver and seems to be doing an excellent job. She had a fairly significant wound on the heel with eschar covering however this was lifting up and it appears to be mostly healed underneath the biggest issue is on the posterior aspect of the heel where she has a small slit of an opening in the Achilles region which is actually what is mainly painful for her today. We do not have a hemoglobin A1c as the patient actually is seen by a provider outside of the [facility] system so I am not able to look this up. With that being said I do not see any evidence of active infection and I do think that she is actually doing quite well I think her daughter is doing a  great job taking care of the wound in particular. With regard to her past medical history she does have dementia and diabetes mellitus type 2 but otherwise no major medical problems. She did have an x-ray performed 11/27/2020 which was negative for any signs of osteomyelitis. 01/03/2021 upon evaluation today patient appears to be doing well with regard to her heel ulcer. She has been tolerating the dressing changes without complication the collagen seems to be doing a good job for her. I do not see any signs of infection at this point. Unfortunately the patient is stated to have a wound on her bottom although her daughter is not sure how bad this really is next week we will get a get her into a room with a bed so that we can actually check this out I do not want things to get worse unnecessarily. 01/10/2021 upon evaluation today patient's wound actually appears to be doing well in regard to her heel there is some slough noted I Georgina Peer clear this away with some sharp debridement today. With regard to the gluteal region there is some abrasion noted here. This seems to be very superficial and I think it is from her scratching mainly they have been using Desitin I think she could benefit from possibly utilizing some AandE ointment which I think could be of benefit for her. Fortunately there does not appear to be any signs of active infection systemically which is great news. 01/17/2021 upon evaluation today patient's wound on the gluteal region actually appears to be healed and everything is doing quite well. I am very pleased in that regard. With regard to the wound on her heel that is measuring smaller and looking much better and very pleased with what I see as well today. Objective Constitutional Well-nourished and well-hydrated in no acute distress. Vitals Time Taken: 3:28 PM, Height: 63 in, Weight: 196 lbs,  BMI: 34.7, Temperature: 98.4 F, Pulse: 72 bpm, Respiratory Rate: 16 breaths/min, Blood  Pressure: 141/68 mmHg. Respiratory normal breathing without difficulty. Psychiatric this patient is able to make decisions and demonstrates good insight into disease process. Alert and Oriented x 3. pleasant and cooperative. General Notes: Upon inspection patient's wound bed showed signs of good granulation and epithelization at this point. Overall I feel like that the heel is making signs of good improvement and in general very pleased with where we stand today. Integumentary (Hair, Skin) Wound #1 status is Open. Original cause of wound was Pressure Injury. The date acquired was: 12/25/2020. The wound has been in treatment 3 weeks. The wound is located on the Right Calcaneus. The wound measures 0.2cm length x 0.4cm width x 0.1cm depth; 0.063cm^2 area and 0.006cm^3 volume. There is a medium amount of serosanguineous drainage noted. [name], [name] ([id number]) Wound #2 status is Healed - Epithelialized. Original cause of wound was Trauma. The date acquired was: 01/07/2021. The wound has been in treatment 1 weeks. The wound is located on the Left Gluteus. The wound measures 0cm length x 0cm width x 0cm depth; 0cm^2 area and 0cm^3 volume. There is a small amount of sanguinous drainage noted. Assessment Active Problems ICD-10 Pressure ulcer of right heel, stage 3 Abrasion of lower back and pelvis, initial encounter Type 2 diabetes mellitus with other skin ulcer Vascular dementia without behavioral disturbance Plan Follow-up Appointments: Return Appointment in 1 week. Bathing/ Shower/ Hygiene: May shower; gently cleanse wound with antibacterial soap, rinse and pat dry prior to dressing wounds Off-Loading: Other: - Patient to purchase slippers with rubber soles WOUND #1: - Calcaneus Wound Laterality: Right Cleanser: Normal Saline 3 x Per Week/30 Days Discharge Instructions: Wash your hands with soap and water. Remove old dressing, discard into plastic bag and place into trash. Cleanse the  wound with Normal Saline prior to applying a clean dressing using gauze sponges, not tissues or cotton balls. Do not scrub or use excessive force. Pat dry using gauze sponges, not tissue or cotton balls. Primary Dressing: Prisma 4.34 (in) 3 x Per Week/30 Days Discharge Instructions: Moisten w/normal saline or sterile water; Cover wound as directed. Do not remove from wound bed. Primary Dressing: Zetuvit Plus Silicone Border Dressing 5x5 (in/in) (Generic) 3 x Per Week/30 Days Discharge Instructions: Secure the collagen 1. Would recommend currently that we going to continue with the wound care measures as before and the patient is in agreement with the plan. This includes the use of the silver collagen as well as the border foam dressing to cover which I think is doing a good job. 2. I am also can recommend that we continue with appropriate offloading her daughter is doing a great job at helping in that regard. We will see patient back for reevaluation in 1 week here in the clinic. If anything worsens or changes patient will contact our office for additional recommendations. Electronic Signature(s) Signed: 01/17/2021 4:52:55 PM By: Worthy Keeler PA-C Entered By: Worthy Keeler on 01/17/2021 16:52:54 Stemler, [name] ([id number]) -------------------------------------------------------------------------------- SuperBill Details Patient Name: [name] Date of Service: 01/17/2021 Medical Record Number: [id number] Patient Account Number: [id number] Date of Birth/Sex: 11/27/1931 (87 y.o. F) Treating RN: Cornell Barman Primary Care Provider: Kelton Pillar Other Clinician: Referring Provider: Kelton Pillar Treating Provider/Extender: Skipper Cliche in Treatment: 3 Diagnosis Coding ICD-10 Codes Code Description [id number] Pressure ulcer of right heel, stage 3 S30.810A Abrasion of lower back and pelvis, initial encounter E11.622 Type 2 diabetes  mellitus with other skin ulcer F01.50 Vascular  dementia without behavioral disturbance Facility Procedures CPT4 Code: [id number] Description: 99213 - WOUND CARE VISIT-LEV 3 EST PT Modifier: Quantity: 1 Physician Procedures CPT4 Code: 8921194 Description: 17408 - WC PHYS LEVEL 3 - EST PT Modifier: Quantity: 1 CPT4 Code: Description: ICD-10 Diagnosis Description X44.818 Pressure ulcer of right heel, stage 3 S30.810A Abrasion of lower back and pelvis, initial encounter E11.622 Type 2 diabetes mellitus with other skin ulcer F01.50 Vascular dementia without behavioral  disturbance Modifier: Quantity: Electronic Signature(s) Signed: 01/17/2021 4:53:13 PM By: Worthy Keeler PA-C Previous Signature: 01/17/2021 4:10:18 PM Version By: Gretta Cool, BSN, RN, CWS, Kim RN, BSN Entered By: Worthy Keeler on 01/17/2021 16:53:13

## 2021-01-18 NOTE — Progress Notes (Signed)
COMMUNITY PALLIATIVE CARE SW NOTE  PATIENT NAME: [name] DOB: Nov 04, 1931 MRN: [id number]  PRIMARY CARE PROVIDER: Kelton Pillar, MD  RESPONSIBLE PARTY:  Acct ID - Guarantor Home Phone Work Phone Relationship Acct Type  [id number] [name], [name] [phone]  Self P/F     [place] [address], [place], [place] [address]   Due to the COVID-19 crisis, this virtual check-in visit was done via telephone from my office and it was initiated and consent by this patient and or family.   PLAN OF CARE and INTERVENTIONS:             GOALS OF CARE/ ADVANCE CARE PLANNING:  Goal is for patient to remain at home. Patient is code status.  SOCIAL/EMOTIONAL/SPIRITUAL ASSESSMENT/ INTERVENTIONS:  SW and RN-M. Nadara Mustard completed a Soil scientist with patient's [name]. [name] provided an update on patient's status. Patient continues to have wound on her heel and she is going to the wound clinic in [place] 1x week (Thursday). She is ambulating slower, but continues to ambulate with a walker.Patient's cognitive deficits have increased according to daughter. [name] report that patient is weaker, where it is taking 2 people to help her stand up to her walker. Her appetite is fair, but declined overall where she is eating at least 50% of 1 meal a day with snacks. Patient has caregiver through [facility] that assist with patient personal care. Patient has new equipment in the home: electric bed, wheelchair, raised toilet seat and a hoyer lift. [name] remains open to ongoing palliative care visits and support.  PATIENT/CAREGIVER EDUCATION/ COPING:  Patient and PCG appear to be coping well.  PERSONAL EMERGENCY PLAN:  911 can be activated. COMMUNITY RESOURCES COORDINATION/ HEALTH CARE NAVIGATION:  Patient has private caregiver. FINANCIAL/LEGAL CONCERNS/INTERVENTIONS:  None.     SOCIAL HX:  Social History   Tobacco Use   Smoking status: Former    Types: Cigarettes    Quit date: 03/10/1981    Years since  quitting: 39.8   Smokeless tobacco: Never  Substance Use Topics   Alcohol use: No    CODE STATUS: Full Code ADVANCED DIRECTIVES:No  MOST FORM COMPLETE:  No HOSPICE EDUCATION PROVIDED: NO  PPS: Patient is alert and oriented to self and situation, but has some cognitive deficits.   Duration of visit and documentation: 60 minutes.    [address] [place], [place]

## 2021-01-23 ENCOUNTER — Ambulatory Visit: Payer: Medicare Other | Admitting: Cardiology

## 2021-01-25 ENCOUNTER — Encounter: Payer: Medicare Other | Admitting: Physician Assistant

## 2021-01-25 ENCOUNTER — Other Ambulatory Visit: Payer: Self-pay

## 2021-01-25 DIAGNOSIS — E1169 Type 2 diabetes mellitus with other specified complication: Secondary | ICD-10-CM | POA: Diagnosis not present

## 2021-01-25 DIAGNOSIS — L89613 Pressure ulcer of right heel, stage 3: Secondary | ICD-10-CM | POA: Diagnosis not present

## 2021-01-25 DIAGNOSIS — E11622 Type 2 diabetes mellitus with other skin ulcer: Secondary | ICD-10-CM | POA: Diagnosis not present

## 2021-01-25 DIAGNOSIS — E1151 Type 2 diabetes mellitus with diabetic peripheral angiopathy without gangrene: Secondary | ICD-10-CM | POA: Diagnosis not present

## 2021-01-25 NOTE — Progress Notes (Addendum)
[name] ([id number]) Visit Report for 01/25/2021 Chief Complaint Document Details Patient Name: [name], [name]. Date of Service: 01/25/2021 3:30 PM Medical Record Number: [id number] Patient Account Number: [id number] Date of Birth/Sex: 1931-11-08 (87 y.o. F) Treating RN: Donnamarie Poag Primary Care Provider: Kelton Pillar Other Clinician: Referring Provider: Kelton Pillar Treating Provider/Extender: Skipper Cliche in Treatment: 4 Information Obtained from: Patient Chief Complaint Right heel pressure ulcer Electronic Signature(s) Signed: 01/25/2021 3:55:02 PM By: Worthy Keeler PA-C Entered By: Worthy Keeler on 01/25/2021 15:55:02 Loredo, [name] ([id number]) -------------------------------------------------------------------------------- HPI Details Patient Name: [name] Date of Service: 01/25/2021 3:30 PM Medical Record Number: [id number] Patient Account Number: [id number] Date of Birth/Sex: [birth year] (87 y.o. F) Treating RN: Donnamarie Poag Primary Care Provider: Kelton Pillar Other Clinician: Referring Provider: Kelton Pillar Treating Provider/Extender: Skipper Cliche in Treatment: 4 History of Present Illness HPI Description: 12/25/20 upon evaluation today patient presents for a wound on her heel which has been present for about 1 and half months or so according to her daughter. The patient does have dementia. With that being said her daughter is the primary caregiver and seems to be doing an excellent job. She had a fairly significant wound on the heel with eschar covering however this was lifting up and it appears to be mostly healed underneath the biggest issue is on the posterior aspect of the heel where she has a small slit of an opening in the Achilles region which is actually what is mainly painful for her today. We do not have a hemoglobin A1c as the patient actually is seen by a provider outside of the [facility] system so I am not able to look this up. With that  being said I do not see any evidence of active infection and I do think that she is actually doing quite well I think her daughter is doing a great job taking care of the wound in particular. With regard to her past medical history she does have dementia and diabetes mellitus type 2 but otherwise no major medical problems. She did have an x-ray performed 11/27/2020 which was negative for any signs of osteomyelitis. 01/03/2021 upon evaluation today patient appears to be doing well with regard to her heel ulcer. She has been tolerating the dressing changes without complication the collagen seems to be doing a good job for her. I do not see any signs of infection at this point. Unfortunately the patient is stated to have a wound on her bottom although her daughter is not sure how bad this really is next week we will get a get her into a room with a bed so that we can actually check this out I do not want things to get worse unnecessarily. 01/10/2021 upon evaluation today patient's wound actually appears to be doing well in regard to her heel there is some slough noted I Georgina Peer clear this away with some sharp debridement today. With regard to the gluteal region there is some abrasion noted here. This seems to be very superficial and I think it is from her scratching mainly they have been using Desitin I think she could benefit from possibly utilizing some AandE ointment which I think could be of benefit for her. Fortunately there does not appear to be any signs of active infection systemically which is great news. 01/17/2021 upon evaluation today patient's wound on the gluteal region actually appears to be healed and everything is doing quite well. I am very pleased in that regard.  With regard to the wound on her heel that is measuring smaller and looking much better and very pleased with what I see as well today. 01/25/2021 upon evaluation today patient appears to be doing well in regard to her wounds. She  has been actually healing quite nicely in regard to the heel and the gluteal region also seems to be doing quite well. I do not see any evidence of infection which is great. She had several areas of scratching that she had been messing with but again nothing that appears to be open at the this point. Electronic Signature(s) Signed: 01/25/2021 4:34:37 PM By: Worthy Keeler PA-C Entered By: Worthy Keeler on 01/25/2021 16:34:36 [name] ([id number]) -------------------------------------------------------------------------------- Physical Exam Details Patient Name: [name] Date of Service: 01/25/2021 3:30 PM Medical Record Number: [id number] Patient Account Number: [id number] Date of Birth/Sex: [birth year] (87 y.o. F) Treating RN: Donnamarie Poag Primary Care Provider: Kelton Pillar Other Clinician: Referring Provider: Kelton Pillar Treating Provider/Extender: Jeri Cos Weeks in Treatment: 4 Constitutional Well-nourished and well-hydrated in no acute distress. Respiratory normal breathing without difficulty. Psychiatric this patient is able to make decisions and demonstrates good insight into disease process. Alert and Oriented x 3. pleasant and cooperative. Notes Patient's wound bed showed signs of good granulation and epithelization at this point. Fortunately I do not think that she is doing badly at all and the heel seems to be managing quite nicely. Electronic Signature(s) Signed: 01/25/2021 4:34:53 PM By: Worthy Keeler PA-C Entered By: Worthy Keeler on 01/25/2021 16:34:53 Riederer, [name] ([id number]) -------------------------------------------------------------------------------- Physician Orders Details Patient Name: [name] Date of Service: 01/25/2021 3:30 PM Medical Record Number: [id number] Patient Account Number: [id number] Date of Birth/Sex: 03/02/1932 (87 y.o. F) Treating RN: Donnamarie Poag Primary Care Provider: Kelton Pillar Other Clinician: Referring  Provider: Kelton Pillar Treating Provider/Extender: Skipper Cliche in Treatment: 4 Verbal / Phone Orders: No Diagnosis Coding ICD-10 Coding Code Description [phone] Pressure ulcer of right heel, stage 3 S30.810A Abrasion of lower back and pelvis, initial encounter E11.622 Type 2 diabetes mellitus with other skin ulcer F01.50 Vascular dementia without behavioral disturbance Follow-up Appointments o Return Appointment in 2 weeks. - after holiday Bathing/ Shower/ Hygiene o May shower; gently cleanse wound with antibacterial soap, rinse and pat dry prior to dressing wounds Off-Loading o Gel wheelchair cushion - Recommended o Turn and reposition every 2 hours - reposition every 2 hrs to prevent skin breakdown o Other: - Patient to purchase slippers with rubber soles Additional Orders / Instructions o Follow Nutritious Diet and Increase Protein Intake Wound Treatment Wound #1 - Calcaneus Wound Laterality: Right Cleanser: Normal Saline 3 x Per Week/30 Days Discharge Instructions: Wash your hands with soap and water. Remove old dressing, discard into plastic bag and place into trash. Cleanse the wound with Normal Saline prior to applying a clean dressing using gauze sponges, not tissues or cotton balls. Do not scrub or use excessive force. Pat dry using gauze sponges, not tissue or cotton balls. Primary Dressing: Prisma 4.34 (in) 3 x Per Week/30 Days Discharge Instructions: Moisten w/normal saline or sterile water; Cover wound as directed. Do not remove from wound bed. Primary Dressing: Zetuvit Plus Silicone Border Dressing 5x5 (in/in) (Generic) 3 x Per Week/30 Days Discharge Instructions: Secure the collagen Electronic Signature(s) Signed: 01/25/2021 5:42:25 PM By: Worthy Keeler PA-C Signed: 01/28/2021 10:03:00 AM By: Donnamarie Poag Entered By: Donnamarie Poag on 01/25/2021 15:58:29 Tasker, [name]  ([id number]) -------------------------------------------------------------------------------- Problem List Details Patient  Name: Longshore, [name] Date of Service: 01/25/2021 3:30 PM Medical Record Number: [id number] Patient Account Number: [id number] Date of Birth/Sex: [birth year] (87 y.o. F) Treating RN: Donnamarie Poag Primary Care Provider: Kelton Pillar Other Clinician: Referring Provider: Kelton Pillar Treating Provider/Extender: Skipper Cliche in Treatment: 4 Active Problems ICD-10 Encounter Code Description Active Date MDM Diagnosis L89.613 Pressure ulcer of right heel, stage 3 12/25/2020 No Yes S30.810A Abrasion of lower back and pelvis, initial encounter 01/10/2021 No Yes E11.622 Type 2 diabetes mellitus with other skin ulcer 12/25/2020 No Yes F01.50 Vascular dementia without behavioral disturbance 12/25/2020 No Yes Inactive Problems Resolved Problems Electronic Signature(s) Signed: 01/25/2021 3:54:35 PM By: Worthy Keeler PA-C Entered By: Worthy Keeler on 01/25/2021 15:54:35 Snowden, [name] ([id number]) -------------------------------------------------------------------------------- Progress Note Details Patient Name: [name] Date of Service: 01/25/2021 3:30 PM Medical Record Number: [id number] Patient Account Number: [id number] Date of Birth/Sex: 01/13/1932 (87 y.o. F) Treating RN: Donnamarie Poag Primary Care Provider: Kelton Pillar Other Clinician: Referring Provider: Kelton Pillar Treating Provider/Extender: Skipper Cliche in Treatment: 4 Subjective Chief Complaint Information obtained from Patient Right heel pressure ulcer History of Present Illness (HPI) 12/25/20 upon evaluation today patient presents for a wound on her heel which has been present for about 1 and half months or so according to her daughter. The patient does have dementia. With that being said her daughter is the primary caregiver and seems to be doing an excellent job. She had a fairly  significant wound on the heel with eschar covering however this was lifting up and it appears to be mostly healed underneath the biggest issue is on the posterior aspect of the heel where she has a small slit of an opening in the Achilles region which is actually what is mainly painful for her today. We do not have a hemoglobin A1c as the patient actually is seen by a provider outside of the [facility] system so I am not able to look this up. With that being said I do not see any evidence of active infection and I do think that she is actually doing quite well I think her daughter is doing a great job taking care of the wound in particular. With regard to her past medical history she does have dementia and diabetes mellitus type 2 but otherwise no major medical problems. She did have an x-ray performed 11/27/2020 which was negative for any signs of osteomyelitis. 01/03/2021 upon evaluation today patient appears to be doing well with regard to her heel ulcer. She has been tolerating the dressing changes without complication the collagen seems to be doing a good job for her. I do not see any signs of infection at this point. Unfortunately the patient is stated to have a wound on her bottom although her daughter is not sure how bad this really is next week we will get a get her into a room with a bed so that we can actually check this out I do not want things to get worse unnecessarily. 01/10/2021 upon evaluation today patient's wound actually appears to be doing well in regard to her heel there is some slough noted I Georgina Peer clear this away with some sharp debridement today. With regard to the gluteal region there is some abrasion noted here. This seems to be very superficial and I think it is from her scratching mainly they have been using Desitin I think she could benefit from possibly utilizing some AandE ointment which I think could be of benefit for  her. Fortunately there does not appear to be any signs of  active infection systemically which is great news. 01/17/2021 upon evaluation today patient's wound on the gluteal region actually appears to be healed and everything is doing quite well. I am very pleased in that regard. With regard to the wound on her heel that is measuring smaller and looking much better and very pleased with what I see as well today. 01/25/2021 upon evaluation today patient appears to be doing well in regard to her wounds. She has been actually healing quite nicely in regard to the heel and the gluteal region also seems to be doing quite well. I do not see any evidence of infection which is great. She had several areas of scratching that she had been messing with but again nothing that appears to be open at the this point. Objective Constitutional Well-nourished and well-hydrated in no acute distress. Vitals Time Taken: 3:41 PM, Height: 63 in, Weight: 196 lbs, BMI: 34.7, Temperature: 98.5 F, Pulse: 82 bpm, Respiratory Rate: 16 breaths/min, Blood Pressure: 118/60 mmHg. Respiratory normal breathing without difficulty. Psychiatric this patient is able to make decisions and demonstrates good insight into disease process. Alert and Oriented x 3. pleasant and cooperative. General Notes: Patient's wound bed showed signs of good granulation and epithelization at this point. Fortunately I do not think that she is doing badly at all and the heel seems to be managing quite nicely. Integumentary (Hair, Skin) Bihl, Nakia E. ([id number]) Wound #1 status is Open. Original cause of wound was Pressure Injury. The date acquired was: 12/25/2020. The wound has been in treatment 4 weeks. The wound is located on the Right Calcaneus. The wound measures 0.2cm length x 0.4cm width x 0.1cm depth; 0.063cm^2 area and 0.006cm^3 volume. There is no tunneling or undermining noted. There is a medium amount of serosanguineous drainage noted. There is medium (34-66%) red granulation within the wound bed.  There is a medium (34-66%) amount of necrotic tissue within the wound bed including Adherent Slough. Assessment Active Problems ICD-10 Pressure ulcer of right heel, stage 3 Abrasion of lower back and pelvis, initial encounter Type 2 diabetes mellitus with other skin ulcer Vascular dementia without behavioral disturbance Plan Follow-up Appointments: Return Appointment in 2 weeks. - after holiday Bathing/ Shower/ Hygiene: May shower; gently cleanse wound with antibacterial soap, rinse and pat dry prior to dressing wounds Off-Loading: Gel wheelchair cushion - Recommended Turn and reposition every 2 hours - reposition every 2 hrs to prevent skin breakdown Other: - Patient to purchase slippers with rubber soles Additional Orders / Instructions: Follow Nutritious Diet and Increase Protein Intake WOUND #1: - Calcaneus Wound Laterality: Right Cleanser: Normal Saline 3 x Per Week/30 Days Discharge Instructions: Wash your hands with soap and water. Remove old dressing, discard into plastic bag and place into trash. Cleanse the wound with Normal Saline prior to applying a clean dressing using gauze sponges, not tissues or cotton balls. Do not scrub or use excessive force. Pat dry using gauze sponges, not tissue or cotton balls. Primary Dressing: Prisma 4.34 (in) 3 x Per Week/30 Days Discharge Instructions: Moisten w/normal saline or sterile water; Cover wound as directed. Do not remove from wound bed. Primary Dressing: Zetuvit Plus Silicone Border Dressing 5x5 (in/in) (Generic) 3 x Per Week/30 Days Discharge Instructions: Secure the collagen 1. Would recommend him going to continue with wound care measures as before and the patient is in agreement with that plan. This includes the use of the silver collagen followed by  the border foam dressing to cover. 2. Also can recommend at this time that we have the patient continue with keeping pressure off of the heel obviously I think they are doing  a good job with this but need to keep up the good work. We will see patient back for reevaluation in 1 week here in the clinic. If anything worsens or changes patient will contact our office for additional recommendations. Electronic Signature(s) Signed: 01/25/2021 4:35:35 PM By: Worthy Keeler PA-C Entered By: Worthy Keeler on 01/25/2021 16:35:35 Kulkarni, [name] ([id number]) -------------------------------------------------------------------------------- SuperBill Details Patient Name: [name] Date of Service: 01/25/2021 Medical Record Number: [id number] Patient Account Number: [id number] Date of Birth/Sex: [birth year] (87 y.o. F) Treating RN: Donnamarie Poag Primary Care Provider: Kelton Pillar Other Clinician: Referring Provider: Kelton Pillar Treating Provider/Extender: Skipper Cliche in Treatment: 4 Diagnosis Coding ICD-10 Codes Code Description [phone] Pressure ulcer of right heel, stage 3 S30.810A Abrasion of lower back and pelvis, initial encounter E11.622 Type 2 diabetes mellitus with other skin ulcer F01.50 Vascular dementia without behavioral disturbance Facility Procedures CPT4 Code: [id number] Description: 99213 - WOUND CARE VISIT-LEV 3 EST PT Modifier: Quantity: 1 Physician Procedures CPT4 Code: [id number] Description: 16606 - WC PHYS LEVEL 3 - EST PT Modifier: Quantity: 1 CPT4 Code: Description: ICD-10 Diagnosis Description L89.613 Pressure ulcer of right heel, stage 3 S30.810A Abrasion of lower back and pelvis, initial encounter E11.622 Type 2 diabetes mellitus with other skin ulcer F01.50 Vascular dementia without behavioral  disturbance Modifier: Quantity: Electronic Signature(s) Signed: 01/25/2021 4:35:49 PM By: Worthy Keeler PA-C Entered By: Worthy Keeler on 01/25/2021 16:35:49

## 2021-01-28 NOTE — Progress Notes (Signed)
[name], [name] ([id number]) Visit Report for 01/25/2021 Arrival Information Details Patient Name: [name], [name] Date of Service: 01/25/2021 3:30 PM Medical Record Number: [id number] Patient Account Number: [id number] Date of Birth/Sex: 05/03/1931 (87 y.o. F) Treating RN: Donnamarie Poag Primary Care Jerrett Baldinger: Kelton Pillar Other Clinician: Referring Rickita Forstner: Kelton Pillar Treating Layson Bertsch/Extender: Skipper Cliche in Treatment: 4 Visit Information History Since Last Visit Added or deleted any medications: No Patient Arrived: Wheel Chair Had a fall or experienced change in No Arrival Time: 15:39 activities of daily living that may affect Accompanied By: daughter risk of falls: Transfer Assistance: Manual Hospitalized since last visit: No Patient Identification Verified: Yes Has Dressing in Place as Prescribed: Yes Secondary Verification Process Completed: Yes Pain Present Now: Yes Patient Has Alerts: Yes Patient Alerts: Type II Diabetic Electronic Signature(s) Signed: 01/28/2021 10:03:00 AM By: Donnamarie Poag Entered By: Donnamarie Poag on 01/25/2021 15:41:01 [name], [name] ([id number]) -------------------------------------------------------------------------------- Clinic Level of Care Assessment Details Patient Name: Schnick, [name] Date of Service: 01/25/2021 3:30 PM Medical Record Number: [id number] Patient Account Number: [id number] Date of Birth/Sex: Jun 04, 1931 (87 y.o. F) Treating RN: Donnamarie Poag Primary Care Tatumn Corbridge: Kelton Pillar Other Clinician: Referring Christe Tellez: Kelton Pillar Treating Amanpreet Delmont/Extender: Skipper Cliche in Treatment: 4 Clinic Level of Care Assessment Items TOOL 4 Quantity Score []  - Use when only an EandM is performed on FOLLOW-UP visit 0 ASSESSMENTS - Nursing Assessment / Reassessment []  - Reassessment of Co-morbidities (includes updates in patient status) 0 []  - 0 Reassessment of Adherence to Treatment Plan ASSESSMENTS - Wound and Skin  Assessment / Reassessment X - Simple Wound Assessment / Reassessment - one wound 1 5 []  - 0 Complex Wound Assessment / Reassessment - multiple wounds []  - 0 Dermatologic / Skin Assessment (not related to wound area) ASSESSMENTS - Focused Assessment []  - Circumferential Edema Measurements - multi extremities 0 []  - 0 Nutritional Assessment / Counseling / Intervention []  - 0 Lower Extremity Assessment (monofilament, tuning fork, pulses) []  - 0 Peripheral Arterial Disease Assessment (using hand held doppler) ASSESSMENTS - Ostomy and/or Continence Assessment and Care []  - Incontinence Assessment and Management 0 []  - 0 Ostomy Care Assessment and Management (repouching, etc.) PROCESS - Coordination of Care X - Simple Patient / Family Education for ongoing care 1 15 []  - 0 Complex (extensive) Patient / Family Education for ongoing care X- 1 10 Staff obtains Consents, Records, Test Results / Process Orders []  - 0 Staff telephones HHA, Nursing Homes / Clarify orders / etc []  - 0 Routine Transfer to another Facility (non-emergent condition) []  - 0 Routine Hospital Admission (non-emergent condition) []  - 0 New Admissions / Biomedical engineer / Ordering NPWT, Apligraf, etc. []  - 0 Emergency Hospital Admission (emergent condition) X- 1 10 Simple Discharge Coordination []  - 0 Complex (extensive) Discharge Coordination PROCESS - Special Needs []  - Pediatric / Minor Patient Management 0 []  - 0 Isolation Patient Management []  - 0 Hearing / Language / Visual special needs []  - 0 Assessment of Community assistance (transportation, D/C planning, etc.) []  - 0 Additional assistance / Altered mentation []  - 0 Support Surface(s) Assessment (bed, cushion, seat, etc.) INTERVENTIONS - Wound Cleansing / Measurement David, Tanga E. ([id number]) X- 1 5 Simple Wound Cleansing - one wound []  - 0 Complex Wound Cleansing - multiple wounds X- 1 5 Wound Imaging (photographs - any number of  wounds) []  - 0 Wound Tracing (instead of photographs) X- 1 5 Simple Wound Measurement - one wound []  - 0 Complex Wound Measurement -  multiple wounds INTERVENTIONS - Wound Dressings X - Small Wound Dressing one or multiple wounds 1 10 $Re'[]'QgA$  - 0 Medium Wound Dressing one or multiple wounds $RemoveBeforeD'[]'dSvHzvtyJGQsXa$  - 0 Large Wound Dressing one or multiple wounds X- 1 5 Application of Medications - topical $RemoveB'[]'dmwYgxeE$  - 0 Application of Medications - injection INTERVENTIONS - Miscellaneous $RemoveBeforeD'[]'xqPstxcLtflEAd$  - External ear exam 0 $Remo'[]'NtPXS$  - 0 Specimen Collection (cultures, biopsies, blood, body fluids, etc.) $RemoveBefor'[]'XvNIBgLBuSHI$  - 0 Specimen(s) / Culture(s) sent or taken to Lab for analysis X- 1 10 Patient Transfer (multiple staff / Harrel Lemon Lift / Similar devices) $RemoveBeforeDE'[]'jbNQsCaTEQVbxiK$  - 0 Simple Staple / Suture removal (25 or less) $Remove'[]'PjaiaLO$  - 0 Complex Staple / Suture removal (26 or more) $Remove'[]'lhNjZtF$  - 0 Hypo / Hyperglycemic Management (close monitor of Blood Glucose) $RemoveBefore'[]'OAFCtyzdfNjqC$  - 0 Ankle / Brachial Index (ABI) - do not check if billed separately X- 1 5 Vital Signs Has the patient been seen at the hospital within the last three years: Yes Total Score: 85 Level Of Care: New/Established - Level 3 Electronic Signature(s) Signed: 01/28/2021 10:03:00 AM By: Donnamarie Poag Entered By: Donnamarie Poag on 01/25/2021 15:59:48 [name], [name] ([id number]) -------------------------------------------------------------------------------- Encounter Discharge Information Details Patient Name: [name] Date of Service: 01/25/2021 3:30 PM Medical Record Number: [id number] Patient Account Number: [id number] Date of Birth/Sex: 05-22-1931 (87 y.o. F) Treating RN: Donnamarie Poag Primary Care Lashina Milles: Kelton Pillar Other Clinician: Referring Janat Tabbert: Kelton Pillar Treating Julyssa Kyer/Extender: Skipper Cliche in Treatment: 4 Encounter Discharge Information Items Discharge Condition: Stable Ambulatory Status: Wheelchair Discharge Destination: Home Transportation: Private Auto Accompanied By:  daughter Schedule Follow-up Appointment: Yes Clinical Summary of Care: Electronic Signature(s) Signed: 01/28/2021 10:03:00 AM By: Donnamarie Poag Entered By: Donnamarie Poag on 01/25/2021 16:07:58 Taaffe, [name] ([id number]) -------------------------------------------------------------------------------- Lower Extremity Assessment Details Patient Name: [name] Date of Service: 01/25/2021 3:30 PM Medical Record Number: [id number] Patient Account Number: [id number] Date of Birth/Sex: 19-May-1931 (87 y.o. F) Treating RN: Donnamarie Poag Primary Care Myya Meenach: Kelton Pillar Other Clinician: Referring Shannan Slinker: Kelton Pillar Treating Shawntez Dickison/Extender: Jeri Cos Weeks in Treatment: 4 Edema Assessment Assessed: [Left: Yes] [Right: No] [Left: Edema] [Right: :] Vascular Assessment Pulses: Dorsalis Pedis Palpable: [Left:Yes] Electronic Signature(s) Signed: 01/28/2021 10:03:00 AM By: Donnamarie Poag Entered By: Donnamarie Poag on 01/25/2021 15:50:54 [name], [name] ([id number]) -------------------------------------------------------------------------------- Multi Wound Chart Details Patient Name: [name] Date of Service: 01/25/2021 3:30 PM Medical Record Number: [id number] Patient Account Number: [id number] Date of Birth/Sex: [birth year] (87 y.o. F) Treating RN: Donnamarie Poag Primary Care Ranferi Clingan: Kelton Pillar Other Clinician: Referring Guida Asman: Kelton Pillar Treating Fin Hupp/Extender: Skipper Cliche in Treatment: 4 Vital Signs Height(in): 63 Pulse(bpm): 82 Weight(lbs): 196 Blood Pressure(mmHg): 118/60 Body Mass Index(BMI): 35 Temperature(F): 98.5 Respiratory Rate(breaths/min): 16 Photos: [N/A:N/A] Wound Location: Right Calcaneus N/A N/A Wounding Event: Pressure Injury N/A N/A Primary Etiology: Pressure Ulcer N/A N/A Comorbid History: Hypertension, Type II Diabetes, N/A N/A Osteoarthritis, Dementia Date Acquired: 12/25/2020 N/A N/A Weeks of Treatment: 4 N/A N/A Wound Status:  Open N/A N/A Measurements L x W x D (cm) 0.2x0.4x0.1 N/A N/A Area (cm) : 0.063 N/A N/A Volume (cm) : 0.006 N/A N/A % Reduction in Area: 74.90% N/A N/A % Reduction in Volume: 88.00% N/A N/A Classification: Category/Stage III N/A N/A Exudate Amount: Medium N/A N/A Exudate Type: Serosanguineous N/A N/A Exudate Color: red, brown N/A N/A Granulation Amount: Medium (34-66%) N/A N/A Granulation Quality: Red N/A N/A Necrotic Amount: Medium (34-66%) N/A N/A Treatment Notes Electronic Signature(s) Signed: 01/28/2021 10:03:00 AM By: Donnamarie Poag Entered ByLyndel Safe,  Joy on 01/25/2021 15:52:21 Urizar, DEVYNN HESSLER ([id number]) -------------------------------------------------------------------------------- [place] Details Patient Name: [name], [name] Date of Service: 01/25/2021 3:30 PM Medical Record Number: [id number] Patient Account Number: [id number] Date of Birth/Sex: [birth year] (87 y.o. F) Treating RN: Donnamarie Poag Primary Care Starletta Houchin: Kelton Pillar Other Clinician: Referring Kenaz Olafson: Kelton Pillar Treating Keoni Havey/Extender: Skipper Cliche in Treatment: 4 Active Inactive Pressure Nursing Diagnoses: Knowledge deficit related to causes and risk factors for pressure ulcer development Knowledge deficit related to management of pressures ulcers Potential for impaired tissue integrity related to pressure, friction, moisture, and shear Goals: Patient will remain free from development of additional pressure ulcers Date Initiated: 12/25/2020 Target Resolution Date: 01/25/2021 Goal Status: Active Patient will remain free of pressure ulcers Date Initiated: 12/25/2020 Target Resolution Date: 01/25/2021 Goal Status: Active Patient/caregiver will verbalize risk factors for pressure ulcer development Date Initiated: 12/25/2020 Target Resolution Date: 12/25/2020 Goal Status: Active Patient/caregiver will verbalize understanding of pressure ulcer management Date Initiated:  12/25/2020 Target Resolution Date: 12/25/2020 Goal Status: Active Interventions: Assess: immobility, friction, shearing, incontinence upon admission and as needed Assess offloading mechanisms upon admission and as needed Assess potential for pressure ulcer upon admission and as needed Provide education on pressure ulcers Notes: Wound/Skin Impairment Nursing Diagnoses: Impaired tissue integrity Goals: Patient/caregiver will verbalize understanding of skin care regimen Date Initiated: 12/25/2020 Date Inactivated: 01/25/2021 Target Resolution Date: 12/25/2020 Goal Status: Met Ulcer/skin breakdown will have a volume reduction of 30% by week 4 Date Initiated: 12/25/2020 Target Resolution Date: 01/25/2021 Goal Status: Active Ulcer/skin breakdown will have a volume reduction of 50% by week 8 Date Initiated: 12/25/2020 Target Resolution Date: 02/24/2021 Goal Status: Active Ulcer/skin breakdown will have a volume reduction of 80% by week 12 Date Initiated: 12/25/2020 Target Resolution Date: 03/27/2021 Goal Status: Active Ulcer/skin breakdown will heal within 14 weeks Date Initiated: 12/25/2020 Target Resolution Date: 04/27/2021 Goal Status: Active Interventions: Assess patient/caregiver ability to obtain necessary supplies Vitiello, Levenia E. ([id number]) Assess patient/caregiver ability to perform ulcer/skin care regimen upon admission and as needed Assess ulceration(s) every visit Provide education on ulcer and skin care Treatment Activities: Referred to DME Lennis Rader for dressing supplies : 12/25/2020 Skin care regimen initiated : 12/25/2020 Notes: Electronic Signature(s) Signed: 01/28/2021 10:03:00 AM By: Donnamarie Poag Entered By: Donnamarie Poag on 01/25/2021 15:51:43 [name], [name] ([id number]) -------------------------------------------------------------------------------- Pain Assessment Details Patient Name: [name] Date of Service: 01/25/2021 3:30 PM Medical Record Number:  [id number] Patient Account Number: [id number] Date of Birth/Sex: Oct 25, 1931 (87 y.o. F) Treating RN: Donnamarie Poag Primary Care Shamell Hittle: Kelton Pillar Other Clinician: Referring Parminder Cupples: Kelton Pillar Treating Emani Morad/Extender: Skipper Cliche in Treatment: 4 Active Problems Location of Pain Severity and Description of Pain Patient Has Paino Yes Site Locations Pain Location: Generalized Pain Rate the pain. Current Pain Level: 5 Pain Management and Medication Current Pain Management: Notes stated left knee pain Electronic Signature(s) Signed: 01/28/2021 10:03:00 AM By: Donnamarie Poag Entered By: Donnamarie Poag on 01/25/2021 15:45:15 [name], [name] ([id number]) -------------------------------------------------------------------------------- Patient/Caregiver Education Details Patient Name: [name] Date of Service: 01/25/2021 3:30 PM Medical Record Number: [id number] Patient Account Number: [id number] Date of Birth/Gender: 13-Dec-1931 (87 y.o. F) Treating RN: Donnamarie Poag Primary Care Physician: Kelton Pillar Other Clinician: Referring Physician: Kelton Pillar Treating Physician/Extender: Skipper Cliche in Treatment: 4 Education Assessment Education Provided To: Patient and Caregiver Education Topics Provided Basic Hygiene: Nutrition: Offloading: Pressure: Wound/Skin Impairment: Electronic Signature(s) Signed: 01/28/2021 10:03:00 AM By: Donnamarie Poag Entered By: Donnamarie Poag on 01/25/2021 16:07:08 [name], [place]ly [place]. ([id number]) --------------------------------------------------------------------------------  Wound Assessment Details Patient Name: [name], Kimberly FRANKO. Date of Service: 01/25/2021 3:30 PM Medical Record Number: [id number] Patient Account Number: [id number] Date of Birth/Sex: 10-02-1931 (87 y.o. F) Treating RN: Donnamarie Poag Primary Care Racheal Mathurin: Kelton Pillar Other Clinician: Referring Makenah Karas: Kelton Pillar Treating Kadee Philyaw/Extender: Jeri Cos Weeks in  Treatment: 4 Wound Status Wound Number: 1 Primary Etiology: Pressure Ulcer Wound Location: Right Calcaneus Wound Status: Open Wounding Event: Pressure Injury Comorbid Hypertension, Type II Diabetes, Osteoarthritis, History: Dementia Date Acquired: 12/25/2020 Weeks Of Treatment: 4 Clustered Wound: No Photos Wound Measurements Length: (cm) 0.2 Width: (cm) 0.4 Depth: (cm) 0.1 Area: (cm) 0.063 Volume: (cm) 0.006 % Reduction in Area: 74.9% % Reduction in Volume: 88% Tunneling: No Undermining: No Wound Description Classification: Category/Stage III Exudate Amount: Medium Exudate Type: Serosanguineous Exudate Color: red, brown Foul Odor After Cleansing: No Slough/Fibrino Yes Wound Bed Granulation Amount: Medium (34-66%) Granulation Quality: Red Necrotic Amount: Medium (34-66%) Necrotic Quality: Adherent Slough Treatment Notes Wound #1 (Calcaneus) Wound Laterality: Right Cleanser Normal Saline Discharge Instruction: Wash your hands with soap and water. Remove old dressing, discard into plastic bag and place into trash. Cleanse the wound with Normal Saline prior to applying a clean dressing using gauze sponges, not tissues or cotton balls. Do not scrub or use excessive force. Pat dry using gauze sponges, not tissue or cotton balls. Peri-Wound Care Topical Ruppe, Irys E. ([id number]) Primary Dressing Prisma 4.34 (in) Discharge Instruction: Moisten w/normal saline or sterile water; Cover wound as directed. Do not remove from wound bed. Zetuvit Plus Silicone Border Dressing 5x5 (in/in) Discharge Instruction: Secure the collagen Secondary Dressing Secured With Compression Wrap Compression Stockings Add-Ons Electronic Signature(s) Signed: 01/28/2021 10:03:00 AM By: Donnamarie Poag Entered By: Donnamarie Poag on 01/25/2021 15:48:26 Huie, [name] ([id number]) -------------------------------------------------------------------------------- [place] Details Patient Name: [name] Date of Service: 01/25/2021 3:30 PM Medical Record Number: [id number] Patient Account Number: [id number] Date of Birth/Sex: 01-13-1932 (87 y.o. F) Treating RN: Donnamarie Poag Primary Care Isel Skufca: Kelton Pillar Other Clinician: Referring Eldo Umanzor: Kelton Pillar Treating Deep Bonawitz/Extender: Skipper Cliche in Treatment: 4 Vital Signs Time Taken: 15:41 Temperature (F): 98.5 Height (in): 63 Pulse (bpm): 82 Weight (lbs): 196 Respiratory Rate (breaths/min): 16 Body Mass Index (BMI): 34.7 Blood Pressure (mmHg): 118/60 Reference Range: 80 - 120 mg / dl Electronic Signature(s) Signed: 01/28/2021 10:03:00 AM By: Donnamarie Poag Entered ByDonnamarie Poag on 01/25/2021 15:41:29

## 2021-02-07 ENCOUNTER — Ambulatory Visit: Payer: Medicare Other | Admitting: Physician Assistant

## 2021-02-07 DIAGNOSIS — R262 Difficulty in walking, not elsewhere classified: Secondary | ICD-10-CM | POA: Diagnosis not present

## 2021-02-07 DIAGNOSIS — M1990 Unspecified osteoarthritis, unspecified site: Secondary | ICD-10-CM | POA: Diagnosis not present

## 2021-02-09 DIAGNOSIS — M1990 Unspecified osteoarthritis, unspecified site: Secondary | ICD-10-CM | POA: Diagnosis not present

## 2021-02-15 ENCOUNTER — Other Ambulatory Visit: Payer: Self-pay

## 2021-02-15 ENCOUNTER — Encounter: Payer: Medicare Other | Attending: Physician Assistant | Admitting: Physician Assistant

## 2021-02-15 DIAGNOSIS — F0150 Vascular dementia without behavioral disturbance: Secondary | ICD-10-CM | POA: Diagnosis not present

## 2021-02-15 DIAGNOSIS — E11622 Type 2 diabetes mellitus with other skin ulcer: Secondary | ICD-10-CM | POA: Diagnosis not present

## 2021-02-15 DIAGNOSIS — S30810A Abrasion of lower back and pelvis, initial encounter: Secondary | ICD-10-CM | POA: Diagnosis not present

## 2021-02-15 DIAGNOSIS — X58XXXA Exposure to other specified factors, initial encounter: Secondary | ICD-10-CM | POA: Insufficient documentation

## 2021-02-15 DIAGNOSIS — L89613 Pressure ulcer of right heel, stage 3: Secondary | ICD-10-CM | POA: Insufficient documentation

## 2021-02-15 DIAGNOSIS — E119 Type 2 diabetes mellitus without complications: Secondary | ICD-10-CM | POA: Diagnosis not present

## 2021-02-15 DIAGNOSIS — R6889 Other general symptoms and signs: Secondary | ICD-10-CM | POA: Diagnosis not present

## 2021-02-15 NOTE — Progress Notes (Addendum)
Shed, [name] ([id number]) Visit Report for 02/15/2021 Arrival Information Details Patient Name: [name], [name]. Date of Service: 02/15/2021 3:00 PM Medical Record Number: [id number] Patient Account Number: [id number] Date of Birth/Sex: [birth year] (87 y.o. F) Treating RN: Levora Dredge Primary Care Zarius Furr: Kelton Pillar Other Clinician: Cornell Barman Referring Adysson Revelle: Kelton Pillar Treating Glori Machnik/Extender: Skipper Cliche in Treatment: 7 Visit Information History Since Last Visit Added or deleted any medications: No Patient Arrived: Wheel Chair Any new allergies or adverse reactions: No Arrival Time: 14:52 Had a fall or experienced change in No Accompanied By: daughter activities of daily living that may affect Transfer Assistance: EasyPivot Patient Lift risk of falls: Patient Identification Verified: Yes Hospitalized since last visit: No Secondary Verification Process Completed: Yes Has Dressing in Place as Prescribed: No Patient Has Alerts: Yes Has Compression in Place as Prescribed: Yes Patient Alerts: Type II Diabetic Pain Present Now: Yes Electronic Signature(s) Signed: 02/18/2021 11:01:31 AM By: Levora Dredge Entered By: Levora Dredge on 02/15/2021 15:25:02 Schow, [name] ([id number]) -------------------------------------------------------------------------------- Clinic Level of Care Assessment Details Patient Name: [name] Date of Service: 02/15/2021 3:00 PM Medical Record Number: [id number] Patient Account Number: [id number] Date of Birth/Sex: 1931/08/29 (87 y.o. F) Treating RN: Levora Dredge Primary Care Jalesa Thien: Kelton Pillar Other Clinician: Cornell Barman Referring Jamaris Theard: Kelton Pillar Treating Orvill Coulthard/Extender: Skipper Cliche in Treatment: 7 Clinic Level of Care Assessment Items TOOL 4 Quantity Score X - Use when only an EandM is performed on FOLLOW-UP visit 1 0 ASSESSMENTS - Nursing Assessment / Reassessment [] - Reassessment of  Co-morbidities (includes updates in patient status) 0 [] - 0 Reassessment of Adherence to Treatment Plan ASSESSMENTS - Wound and Skin Assessment / Reassessment X - Simple Wound Assessment / Reassessment - one wound 1 5 [] - 0 Complex Wound Assessment / Reassessment - multiple wounds [] - 0 Dermatologic / Skin Assessment (not related to wound area) ASSESSMENTS - Focused Assessment [] - Circumferential Edema Measurements - multi extremities 0 [] - 0 Nutritional Assessment / Counseling / Intervention [] - 0 Lower Extremity Assessment (monofilament, tuning fork, pulses) [] - 0 Peripheral Arterial Disease Assessment (using hand held doppler) ASSESSMENTS - Ostomy and/or Continence Assessment and Care [] - Incontinence Assessment and Management 0 [] - 0 Ostomy Care Assessment and Management (repouching, etc.) PROCESS - Coordination of Care X - Simple Patient / Family Education for ongoing care 1 15 [] - 0 Complex (extensive) Patient / Family Education for ongoing care [] - 0 Staff obtains Programmer, systems, Records, Test Results / Process Orders [] - 0 Staff telephones HHA, Nursing Homes / Clarify orders / etc [] - 0 Routine Transfer to another Facility (non-emergent condition) [] - 0 Routine Hospital Admission (non-emergent condition) [] - 0 New Admissions / Biomedical engineer / Ordering NPWT, Apligraf, etc. [] - 0 Emergency Hospital Admission (emergent condition) X- 1 10 Simple Discharge Coordination [] - 0 Complex (extensive) Discharge Coordination PROCESS - Special Needs [] - Pediatric / Minor Patient Management 0 [] - 0 Isolation Patient Management [] - 0 Hearing / Language / Visual special needs [] - 0 Assessment of Community assistance (transportation, D/C planning, etc.) [] - 0 Additional assistance / Altered mentation [] - 0 Support Surface(s) Assessment (bed, cushion, seat, etc.) INTERVENTIONS - Wound Cleansing / Measurement Remo, Jennfier E. ([id number]) X- 1  5 Simple Wound Cleansing - one wound [] - 0 Complex Wound Cleansing - multiple wounds X- 1 5 Wound Imaging (photographs - any number of wounds) [] -  0 Wound Tracing (instead of photographs) [] - 0 Simple Wound Measurement - one wound [] - 0 Complex Wound Measurement - multiple wounds INTERVENTIONS - Wound Dressings X - Small Wound Dressing one or multiple wounds 1 10 [] - 0 Medium Wound Dressing one or multiple wounds [] - 0 Large Wound Dressing one or multiple wounds [] - 0 Application of Medications - topical [] - 0 Application of Medications - injection INTERVENTIONS - Miscellaneous [] - External ear exam 0 [] - 0 Specimen Collection (cultures, biopsies, blood, body fluids, etc.) [] - 0 Specimen(s) / Culture(s) sent or taken to Lab for analysis [] - 0 Patient Transfer (multiple staff / Hoyer Lift / Similar devices) [] - 0 Simple Staple / Suture removal (25 or less) [] - 0 Complex Staple / Suture removal (26 or more) [] - 0 Hypo / Hyperglycemic Management (close monitor of Blood Glucose) [] - 0 Ankle / Brachial Index (ABI) - do not check if billed separately X- 1 5 Vital Signs Has the patient been seen at the hospital within the last three years: Yes Total Score: 55 Level Of Care: New/Established - Level 2 Electronic Signature(s) Signed: 02/18/2021 11:01:31 AM By: Gordon, Caitlin Entered By: Gordon, Caitlin on 02/15/2021 15:57:39 Volkov, Nihira E. ([id number]) -------------------------------------------------------------------------------- Encounter Discharge Information Details Patient Name: [name], [name] Date of Service: 02/15/2021 3:00 PM Medical Record Number: [id number] Patient Account Number: [id number] Date of Birth/Sex: 01/29/1932 (87 y.o. F) Treating RN: Gordon, Caitlin Primary Care : Griffin, [name] Other Clinician: Woody, Kim Referring : Griffin, [name] Treating /Extender: Stone, Hoyt Weeks in Treatment: 7 Encounter Discharge  Information Items Discharge Condition: Stable Ambulatory Status: Wheelchair Discharge Destination: Home Transportation: Private Auto Accompanied By: daughter Schedule Follow-up Appointment: Yes Clinical Summary of Care: Electronic Signature(s) Signed: 02/15/2021 4:44:45 PM By: Gordon, Caitlin Entered By: Gordon, Caitlin on 02/15/2021 16:44:45 Schuessler, Abbigayle E. ([id number]) -------------------------------------------------------------------------------- Lower Extremity Assessment Details Patient Name: Croslin, Epifania E. Date of Service: 02/15/2021 3:00 PM Medical Record Number: [id number] Patient Account Number: [id number] Date of Birth/Sex: 10/13/1931 (87 y.o. F) Treating RN: Gordon, Caitlin Primary Care : Griffin, [name] Other Clinician: Woody, Kim Referring : Griffin, [name] Treating /Extender: Stone, Hoyt Weeks in Treatment: 7 Edema Assessment Assessed: [Left: No] [Right: Yes] Edema: [Left: Ye] [Right: s] Calf Left: Right: Point of Measurement: 30 cm From Medial Instep 34.5 cm Ankle Left: Right: Point of Measurement: 9 cm From Medial Instep 21.2 cm Vascular Assessment Pulses: Dorsalis Pedis Palpable: [Right:Yes] Electronic Signature(s) Signed: 02/18/2021 11:01:31 AM By: Gordon, Caitlin Entered By: Gordon, Caitlin on 02/15/2021 15:26:39 Dunshee, Kambrey E. ([id number]) -------------------------------------------------------------------------------- Multi Wound Chart Details Patient Name: Cowley, Toria E. Date of Service: 02/15/2021 3:00 PM Medical Record Number: [id number] Patient Account Number: [id number] Date of Birth/Sex: 06/24/1931 (87 y.o. F) Treating RN: Gordon, Caitlin Primary Care : Griffin, [name] Other Clinician: Woody, Kim Referring : Griffin, [name] Treating /Extender: Stone, Hoyt Weeks in Treatment: 7 Vital Signs Height(in): 63 Pulse(bpm): 79 Weight(lbs): 196 Blood Pressure(mmHg): 144/83 Body Mass Index(BMI):  35 Temperature(°F): 98.2 Respiratory Rate(breaths/min): 18 Photos: [N/A:N/A] Wound Location: Right Calcaneus N/A N/A Wounding Event: Pressure Injury N/A N/A Primary Etiology: Pressure Ulcer N/A N/A Comorbid History: Hypertension, Type II Diabetes, N/A N/A Osteoarthritis, Dementia Date Acquired: 12/25/2020 N/A N/A Weeks of Treatment: 7 N/A N/A Wound Status: Open N/A N/A Measurements L x W x D (cm) 0.8x1.5x0.1 N/A N/A Area (cm) : 0.942 N/A N/A Volume (cm) : 0.094 N/A N/A % Reduction in Area: -275.30% N/A N/A % Reduction in Volume: -  88.00% N/A N/A Classification: Unstageable/Unclassified N/A N/A Exudate Amount: None Present N/A N/A Granulation Amount: None Present (0%) N/A N/A Necrotic Amount: Large (67-100%) N/A N/A Necrotic Tissue: Eschar N/A N/A Exposed Structures: Fascia: No N/A N/A Fat Layer (Subcutaneous Tissue): No Tendon: No Muscle: No Joint: No Bone: No Treatment Notes Electronic Signature(s) Signed: 02/15/2021 3:55:13 PM By: Gordon, Caitlin Entered By: Gordon, Caitlin on 02/15/2021 15:55:12 Brockwell, Lise E. ([id number]) -------------------------------------------------------------------------------- Multi-Disciplinary Care Plan Details Patient Name: Ramaker, Akeisha E. Date of Service: 02/15/2021 3:00 PM Medical Record Number: [id number] Patient Account Number: [id number] Date of Birth/Sex: 05/17/1931 (87 y.o. F) Treating RN: Gordon, Caitlin Primary Care : Griffin, [name] Other Clinician: Woody, Kim Referring : Griffin, [name] Treating /Extender: Stone, Hoyt Weeks in Treatment: 7 Active Inactive Pressure Nursing Diagnoses: Knowledge deficit related to causes and risk factors for pressure ulcer development Knowledge deficit related to management of pressures ulcers Potential for impaired tissue integrity related to pressure, friction, moisture, and shear Goals: Patient will remain free from development of additional pressure ulcers Date  Initiated: 12/25/2020 Target Resolution Date: 01/25/2021 Goal Status: Active Patient will remain free of pressure ulcers Date Initiated: 12/25/2020 Target Resolution Date: 01/25/2021 Goal Status: Active Patient/caregiver will verbalize risk factors for pressure ulcer development Date Initiated: 12/25/2020 Target Resolution Date: 12/25/2020 Goal Status: Active Patient/caregiver will verbalize understanding of pressure ulcer management Date Initiated: 12/25/2020 Target Resolution Date: 12/25/2020 Goal Status: Active Interventions: Assess: immobility, friction, shearing, incontinence upon admission and as needed Assess offloading mechanisms upon admission and as needed Assess potential for pressure ulcer upon admission and as needed Provide education on pressure ulcers Notes: Wound/Skin Impairment Nursing Diagnoses: Impaired tissue integrity Goals: Patient/caregiver will verbalize understanding of skin care regimen Date Initiated: 12/25/2020 Date Inactivated: 01/25/2021 Target Resolution Date: 12/25/2020 Goal Status: Met Ulcer/skin breakdown will have a volume reduction of 30% by week 4 Date Initiated: 12/25/2020 Target Resolution Date: 01/25/2021 Goal Status: Active Ulcer/skin breakdown will have a volume reduction of 50% by week 8 Date Initiated: 12/25/2020 Target Resolution Date: 02/24/2021 Goal Status: Active Ulcer/skin breakdown will have a volume reduction of 80% by week 12 Date Initiated: 12/25/2020 Target Resolution Date: 03/27/2021 Goal Status: Active Ulcer/skin breakdown will heal within 14 weeks Date Initiated: 12/25/2020 Target Resolution Date: 04/27/2021 Goal Status: Active Interventions: Assess patient/caregiver ability to obtain necessary supplies Dermody, Lakenzie E. ([id number]) Assess patient/caregiver ability to perform ulcer/skin care regimen upon admission and as needed Assess ulceration(s) every visit Provide education on ulcer and skin care Treatment  Activities: Referred to DME  for dressing supplies : 12/25/2020 Skin care regimen initiated : 12/25/2020 Notes: Electronic Signature(s) Signed: 02/15/2021 3:54:41 PM By: Gordon, Caitlin Entered By: Gordon, Caitlin on 02/15/2021 15:54:41 Bour, Yuli E. ([id number]) -------------------------------------------------------------------------------- Non-Wound Condition Assessment Details Patient Name: Resendes, Kyia E. Date of Service: 02/15/2021 3:00 PM Medical Record Number: [id number] Patient Account Number: [id number] Date of Birth/Gender: 09/12/1931 (87 y.o. F) Treating RN: Gordon, Caitlin Primary Care Physician: Griffin, [name] Other Clinician: Woody, Kim Referring Physician: Griffin, [name] Treating Physician/Extender: Stone, Hoyt Weeks in Treatment: 7 Non-Wound Condition: Condition: Suspected Deep Tissue Injury Location: Foot Side: Left left calcaneus suspected deep tissue injury. Patient and daughter educated on importance of wearing booties while Notes: in bed and placing pillow under bilat calf to off load heels. Photos Electronic Signature(s) Signed: 02/18/2021 11:01:31 AM By: Gordon, Caitlin Entered By: Gordon, Caitlin on 02/15/2021 16:39:32 Younes, Aasiya E. ([id number]) -------------------------------------------------------------------------------- Pain Assessment Details Patient Name: Burningham, Angeliz E. Date of Service: 02/15/2021 3:00 PM Medical Record Number: [id number] Patient   Account Number: [id number] Date of Birth/Sex: 01/17/1932 (87 y.o. F) Treating RN: Gordon, Caitlin Primary Care : Griffin, [name] Other Clinician: Woody, Kim Referring : Griffin, [name] Treating /Extender: Stone, Hoyt Weeks in Treatment: 7 Active Problems Location of Pain Severity and Description of Pain Patient Has Paino Yes Site Locations Rate the pain. Current Pain Level: 3 Pain Management and Medication Current Pain Management: Notes pt states bilat feet Electronic  Signature(s) Signed: 02/18/2021 11:01:31 AM By: Gordon, Caitlin Entered By: Gordon, Caitlin on 02/15/2021 15:26:08 Zaccaro, Enes E. ([id number]) -------------------------------------------------------------------------------- Patient/Caregiver Education Details Patient Name: Roscher, Karon E. Date of Service: 02/15/2021 3:00 PM Medical Record Number: [id number] Patient Account Number: [id number] Date of Birth/Gender: 12/30/1931 (87 y.o. F) Treating RN: Gordon, Caitlin Primary Care Physician: Griffin, [name] Other Clinician: Woody, Kim Referring Physician: Griffin, [name] Treating Physician/Extender: Stone, Hoyt Weeks in Treatment: 7 Education Assessment Education Provided To: Patient and Caregiver Education Topics Provided Pressure: Handouts: Pressure Ulcers: Care and Offloading Methods: Explain/Verbal Responses: State content correctly Wound/Skin Impairment: Handouts: Caring for Your Ulcer Methods: Explain/Verbal Responses: State content correctly Electronic Signature(s) Signed: 02/18/2021 11:01:31 AM By: Gordon, Caitlin Entered By: Gordon, Caitlin on 02/15/2021 15:58:03 Hedding, Lysha E. ([id number]) -------------------------------------------------------------------------------- Wound Assessment Details Patient Name: Dia, Fama E. Date of Service: 02/15/2021 3:00 PM Medical Record Number: [id number] Patient Account Number: [id number] Date of Birth/Sex: 10/16/1931 (87 y.o. F) Treating RN: Gordon, Caitlin Primary Care : Griffin, [name] Other Clinician: Woody, Kim Referring : Griffin, [name] Treating /Extender: Stone, Hoyt Weeks in Treatment: 7 Wound Status Wound Number: 1 Primary Etiology: Pressure Ulcer Wound Location: Right Calcaneus Wound Status: Open Wounding Event: Pressure Injury Comorbid Hypertension, Type II Diabetes, Osteoarthritis, History: Dementia Date Acquired: 12/25/2020 Weeks Of Treatment: 7 Clustered Wound: No Photos Wound  Measurements Length: (cm) 0.8 Width: (cm) 1.5 Depth: (cm) 0.1 Area: (cm) 0.942 Volume: (cm) 0.094 % Reduction in Area: -275.3% % Reduction in Volume: -88% Tunneling: No Undermining: No Wound Description Classification: Unstageable/Unclassified Exudate Amount: None Present Foul Odor After Cleansing: No Slough/Fibrino No Wound Bed Granulation Amount: None Present (0%) Exposed Structure Necrotic Amount: Large (67-100%) Fascia Exposed: No Necrotic Quality: Eschar Fat Layer (Subcutaneous Tissue) Exposed: No Tendon Exposed: No Muscle Exposed: No Joint Exposed: No Bone Exposed: No Treatment Notes Wound #1 (Calcaneus) Wound Laterality: Right Cleanser Normal Saline Discharge Instruction: Wash your hands with soap and water. Remove old dressing, discard into plastic bag and place into trash. Cleanse the wound with Normal Saline prior to applying a clean dressing using gauze sponges, not tissues or cotton balls. Do not scrub or use excessive force. Pat dry using gauze sponges, not tissue or cotton balls. Peri-Wound Care Maurer, Joslynne E. ([id number]) Topical Primary Dressing Zetuvit Plus Silicone Border Dressing 5x5 (in/in) Discharge Instruction: Secure the collagen Secondary Dressing Secured With Compression Wrap Compression Stockings Add-Ons Electronic Signature(s) Signed: 02/18/2021 11:01:31 AM By: Gordon, Caitlin Entered By: Gordon, Caitlin on 02/15/2021 15:42:38 Blyth, Caniyah E. ([id number]) -------------------------------------------------------------------------------- Vitals Details Patient Name: Kibby, Arneshia E. Date of Service: 02/15/2021 3:00 PM Medical Record Number: [id number] Patient Account Number: [id number] Date of Birth/Sex: 03/16/1931 (87 y.o. F) Treating RN: Gordon, Caitlin Primary Care : Griffin, [name] Other Clinician: Woody, Kim Referring : Griffin, [name] Treating /Extender: Stone, Hoyt Weeks in Treatment: 7 Vital Signs Time Taken:  14:54 Temperature (°F): 98.2 Height (in): 63 Pulse (bpm): 79 Weight (lbs): 196 Respiratory Rate (breaths/min): 18 Body Mass Index (BMI): 34.7 Blood Pressure (mmHg): 144/83 Reference Range: 80 - 120 mg / dl Electronic Signature(s) Signed: 02/18/2021 11:01:31 AM By: Gordon,   Caitlin Entered By: Gordon, Caitlin on 02/15/2021 15:25:08 

## 2021-02-15 NOTE — Progress Notes (Addendum)
[name] [name] ([id number]) Visit Report for 02/15/2021 Chief Complaint Document Details Patient Name: [name] [name] WINDHOLZ. Date of Service: 02/15/2021 3:00 PM Medical Record Number: [id number] Patient Account Number: [id number] Date of Birth/Sex: Oct 08, 1931 (87 y.o. F) Treating RN: Cornell Barman Primary Care Provider: Kelton Pillar Other Clinician: Referring Provider: Kelton Pillar Treating Provider/Extender: Skipper Cliche in Treatment: 7 Information Obtained from: Patient Chief Complaint Right heel pressure ulcer Electronic Signature(s) Signed: 02/15/2021 2:41:49 PM By: Worthy Keeler PA-C Entered By: Worthy Keeler on 02/15/2021 14:41:48 [name], [name] [name] ([id number]) -------------------------------------------------------------------------------- HPI Details Patient Name: [name] [name] Date of Service: 02/15/2021 3:00 PM Medical Record Number: [id number] Patient Account Number: [id number] Date of Birth/Sex: September 18, 1931 (87 y.o. F) Treating RN: Cornell Barman Primary Care Provider: Kelton Pillar Other Clinician: Referring Provider: Kelton Pillar Treating Provider/Extender: Skipper Cliche in Treatment: 7 History of Present Illness HPI Description: 12/25/20 upon evaluation today patient presents for a wound on her heel which has been present for about 1 and half months or so according to her daughter. The patient does have dementia. With that being said her daughter is the primary caregiver and seems to be doing an excellent job. She had a fairly significant wound on the heel with eschar covering however this was lifting up and it appears to be mostly healed underneath the biggest issue is on the posterior aspect of the heel where she has a small slit of an opening in the Achilles region which is actually what is mainly painful for her today. We do not have a hemoglobin A1c as the patient actually is seen by a provider outside of the [facility] system so I am not able to look this up. With that being  said I do not see any evidence of active infection and I do think that she is actually doing quite well I think her daughter is doing a great job taking care of the wound in particular. With regard to her past medical history she does have dementia and diabetes mellitus type 2 but otherwise no major medical problems. She did have an x-ray performed 11/27/2020 which was negative for any signs of osteomyelitis. 01/03/2021 upon evaluation today patient appears to be doing well with regard to her heel ulcer. She has been tolerating the dressing changes without complication the collagen seems to be doing a good job for her. I do not see any signs of infection at this point. Unfortunately the patient is stated to have a wound on her bottom although her daughter is not sure how bad this really is next week we will get a get her into a room with a bed so that we can actually check this out I do not want things to get worse unnecessarily. 01/10/2021 upon evaluation today patient's wound actually appears to be doing well in regard to her heel there is some slough noted I Georgina Peer clear this away with some sharp debridement today. With regard to the gluteal region there is some abrasion noted here. This seems to be very superficial and I think it is from her scratching mainly they have been using Desitin I think she could benefit from possibly utilizing some AandE ointment which I think could be of benefit for her. Fortunately there does not appear to be any signs of active infection systemically which is great news. 01/17/2021 upon evaluation today patient's wound on the gluteal region actually appears to be healed and everything is doing quite well. I am very pleased in that regard.  With regard to the wound on her heel that is measuring smaller and looking much better and very pleased with what I see as well today. 01/25/2021 upon evaluation today patient appears to be doing well in regard to her wounds. She has  been actually healing quite nicely in regard to the heel and the gluteal region also seems to be doing quite well. I do not see any evidence of infection which is great. She had several areas of scratching that she had been messing with but again nothing that appears to be open at the this point. 02/15/2021 upon evaluation patient's wounds actually appear to be doing decently well. At this point I am still concerned about the pressure ulcer on her right heel. This does have some eschar overlying I Georgina Peer try to clear this away I am unsure how much is really open underneath though there appears to be some deep tissue injury around as well. Her daughter who generally takes care of her actually unfortunately had a stroke she is with her today but nonetheless is not able to do as much and does not remember as much about what is been going on treatment wise as she otherwise would. Electronic Signature(s) Signed: 02/15/2021 5:37:50 PM By: Worthy Keeler PA-C Entered By: Worthy Keeler on 02/15/2021 17:37:49 Newburn, [name] [name] ([id number]) -------------------------------------------------------------------------------- Physical Exam Details Patient Name: [name] [name] Date of Service: 02/15/2021 3:00 PM Medical Record Number: [id number] Patient Account Number: [id number] Date of Birth/Sex: 02-06-1932 (87 y.o. F) Treating RN: Levora Dredge Primary Care Provider: Kelton Pillar Other Clinician: Cornell Barman Referring Provider: Kelton Pillar Treating Provider/Extender: Jeri Cos Weeks in Treatment: 7 Constitutional Well-nourished and well-hydrated in no acute distress. Respiratory normal breathing without difficulty. Psychiatric this patient is able to make decisions and demonstrates good insight into disease process. Alert and Oriented x 3. pleasant and cooperative. Notes Upon inspection patient's wound bed showed signs of good granulation and epithelization at this point. Fortunately I do not see  any evidence of active infection locally nor systemically though I do see evidence of deep tissue injury on both heels. I am also unsure whether this wound is really completely closed in regard to the right heel. We will get a continue to monitor and will recheck next week. Electronic Signature(s) Signed: 02/15/2021 5:38:12 PM By: Worthy Keeler PA-C Entered By: Worthy Keeler on 02/15/2021 17:38:12 Stephani, [name] [name] ([id number]) -------------------------------------------------------------------------------- Physician Orders Details Patient Name: [name] [name] Date of Service: 02/15/2021 3:00 PM Medical Record Number: [id number] Patient Account Number: [id number] Date of Birth/Sex: Apr 23, 1931 (87 y.o. F) Treating RN: Levora Dredge Primary Care Provider: Kelton Pillar Other Clinician: Cornell Barman Referring Provider: Kelton Pillar Treating Provider/Extender: Skipper Cliche in Treatment: 7 Verbal / Phone Orders: No Diagnosis Coding ICD-10 Coding Code Description [phone] Pressure ulcer of right heel, stage 3 S30.810A Abrasion of lower back and pelvis, initial encounter E11.622 Type 2 diabetes mellitus with other skin ulcer F01.50 Vascular dementia without behavioral disturbance Follow-up Appointments o Return Appointment in 2 weeks. - after holiday Bathing/ Shower/ Hygiene o May shower; gently cleanse wound with antibacterial soap, rinse and pat dry prior to dressing wounds Off-Loading o Gel wheelchair cushion - Recommended o Turn and reposition every 2 hours - reposition every 2 hrs to prevent skin breakdown o Other: - Patient to purchase slippers with rubber soles Additional Orders / Instructions o Follow Nutritious Diet and Increase Protein Intake Wound Treatment Wound #1 - Calcaneus Wound Laterality: Right Cleanser: Normal Saline  3 x Per Week/30 Days Discharge Instructions: Wash your hands with soap and water. Remove old dressing, discard into plastic bag and  place into trash. Cleanse the wound with Normal Saline prior to applying a clean dressing using gauze sponges, not tissues or cotton balls. Do not scrub or use excessive force. Pat dry using gauze sponges, not tissue or cotton balls. Primary Dressing: Zetuvit Plus Silicone Border Dressing 5x5 (in/in) (Generic) 3 x Per Week/30 Days Discharge Instructions: Secure the collagen Electronic Signature(s) Signed: 02/15/2021 6:06:35 PM By: Worthy Keeler PA-C Signed: 02/18/2021 11:01:31 AM By: Levora Dredge Previous Signature: 02/15/2021 3:57:06 PM Version By: Levora Dredge Entered By: Levora Dredge on 02/15/2021 16:43:37 Vardaman, [name] [name] ([id number]) -------------------------------------------------------------------------------- Problem List Details Patient Name: [name] [name] Date of Service: 02/15/2021 3:00 PM Medical Record Number: [id number] Patient Account Number: [id number] Date of Birth/Sex: 1931/07/01 (87 y.o. F) Treating RN: Cornell Barman Primary Care Provider: Kelton Pillar Other Clinician: Referring Provider: Kelton Pillar Treating Provider/Extender: Skipper Cliche in Treatment: 7 Active Problems ICD-10 Encounter Code Description Active Date MDM Diagnosis L89.613 Pressure ulcer of right heel, stage 3 12/25/2020 No Yes S30.810A Abrasion of lower back and pelvis, initial encounter 01/10/2021 No Yes E11.622 Type 2 diabetes mellitus with other skin ulcer 12/25/2020 No Yes F01.50 Vascular dementia without behavioral disturbance 12/25/2020 No Yes Inactive Problems Resolved Problems Electronic Signature(s) Signed: 02/15/2021 2:41:44 PM By: Worthy Keeler PA-C Entered By: Worthy Keeler on 02/15/2021 14:41:44 Arista, [name] [name] ([id number]) -------------------------------------------------------------------------------- Progress Note Details Patient Name: [name] [name] Date of Service: 02/15/2021 3:00 PM Medical Record Number: [id number] Patient Account Number: [id number] Date of  Birth/Sex: 02-Mar-1932 (87 y.o. F) Treating RN: Levora Dredge Primary Care Provider: Kelton Pillar Other Clinician: Cornell Barman Referring Provider: Kelton Pillar Treating Provider/Extender: Skipper Cliche in Treatment: 7 Subjective Chief Complaint Information obtained from Patient Right heel pressure ulcer History of Present Illness (HPI) 12/25/20 upon evaluation today patient presents for a wound on her heel which has been present for about 1 and half months or so according to her daughter. The patient does have dementia. With that being said her daughter is the primary caregiver and seems to be doing an excellent job. She had a fairly significant wound on the heel with eschar covering however this was lifting up and it appears to be mostly healed underneath the biggest issue is on the posterior aspect of the heel where she has a small slit of an opening in the Achilles region which is actually what is mainly painful for her today. We do not have a hemoglobin A1c as the patient actually is seen by a provider outside of the [facility] system so I am not able to look this up. With that being said I do not see any evidence of active infection and I do think that she is actually doing quite well I think her daughter is doing a great job taking care of the wound in particular. With regard to her past medical history she does have dementia and diabetes mellitus type 2 but otherwise no major medical problems. She did have an x-ray performed 11/27/2020 which was negative for any signs of osteomyelitis. 01/03/2021 upon evaluation today patient appears to be doing well with regard to her heel ulcer. She has been tolerating the dressing changes without complication the collagen seems to be doing a good job for her. I do not see any signs of infection at this point. Unfortunately the patient is stated to have a wound  on her bottom although her daughter is not sure how bad this really is next week we will  get a get her into a room with a bed so that we can actually check this out I do not want things to get worse unnecessarily. 01/10/2021 upon evaluation today patient's wound actually appears to be doing well in regard to her heel there is some slough noted I Georgina Peer clear this away with some sharp debridement today. With regard to the gluteal region there is some abrasion noted here. This seems to be very superficial and I think it is from her scratching mainly they have been using Desitin I think she could benefit from possibly utilizing some AandE ointment which I think could be of benefit for her. Fortunately there does not appear to be any signs of active infection systemically which is great news. 01/17/2021 upon evaluation today patient's wound on the gluteal region actually appears to be healed and everything is doing quite well. I am very pleased in that regard. With regard to the wound on her heel that is measuring smaller and looking much better and very pleased with what I see as well today. 01/25/2021 upon evaluation today patient appears to be doing well in regard to her wounds. She has been actually healing quite nicely in regard to the heel and the gluteal region also seems to be doing quite well. I do not see any evidence of infection which is great. She had several areas of scratching that she had been messing with but again nothing that appears to be open at the this point. 02/15/2021 upon evaluation patient's wounds actually appear to be doing decently well. At this point I am still concerned about the pressure ulcer on her right heel. This does have some eschar overlying I Georgina Peer try to clear this away I am unsure how much is really open underneath though there appears to be some deep tissue injury around as well. Her daughter who generally takes care of her actually unfortunately had a stroke she is with her today but nonetheless is not able to do as much and does not remember as much  about what is been going on treatment wise as she otherwise would. Objective Constitutional Well-nourished and well-hydrated in no acute distress. Vitals Time Taken: 2:54 PM, Height: 63 in, Weight: 196 lbs, BMI: 34.7, Temperature: 98.2 F, Pulse: 79 bpm, Respiratory Rate: 18 breaths/min, Blood Pressure: 144/83 mmHg. Respiratory normal breathing without difficulty. Psychiatric this patient is able to make decisions and demonstrates good insight into disease process. Alert and Oriented x 3. pleasant and cooperative. [name] [name] E. ([id number]) General Notes: Upon inspection patient's wound bed showed signs of good granulation and epithelization at this point. Fortunately I do not see any evidence of active infection locally nor systemically though I do see evidence of deep tissue injury on both heels. I am also unsure whether this wound is really completely closed in regard to the right heel. We will get a continue to monitor and will recheck next week. Integumentary (Hair, Skin) Wound #1 status is Open. Original cause of wound was Pressure Injury. The date acquired was: 12/25/2020. The wound has been in treatment 7 weeks. The wound is located on the Right Calcaneus. The wound measures 0.8cm length x 1.5cm width x 0.1cm depth; 0.942cm^2 area and 0.094cm^3 volume. There is no tunneling or undermining noted. There is a none present amount of drainage noted. There is no granulation within the wound bed. There is a large (  67-100%) amount of necrotic tissue within the wound bed including Eschar. Other Condition(s) Patient presents with Suspected Deep Tissue Injury located on the Left Foot. Assessment Active Problems ICD-10 Pressure ulcer of right heel, stage 3 Abrasion of lower back and pelvis, initial encounter Type 2 diabetes mellitus with other skin ulcer Vascular dementia without behavioral disturbance Plan Follow-up Appointments: Return Appointment in 2 weeks. - after holiday Bathing/  Shower/ Hygiene: May shower; gently cleanse wound with antibacterial soap, rinse and pat dry prior to dressing wounds Off-Loading: Gel wheelchair cushion - Recommended Turn and reposition every 2 hours - reposition every 2 hrs to prevent skin breakdown Other: - Patient to purchase slippers with rubber soles Additional Orders / Instructions: Follow Nutritious Diet and Increase Protein Intake WOUND #1: - Calcaneus Wound Laterality: Right Cleanser: Normal Saline 3 x Per Week/30 Days Discharge Instructions: Wash your hands with soap and water. Remove old dressing, discard into plastic bag and place into trash. Cleanse the wound with Normal Saline prior to applying a clean dressing using gauze sponges, not tissues or cotton balls. Do not scrub or use excessive force. Pat dry using gauze sponges, not tissue or cotton balls. Primary Dressing: Zetuvit Plus Silicone Border Dressing 5x5 (in/in) (Generic) 3 x Per Week/30 Days Discharge Instructions: Secure the collagen 1. I would recommend protective dressing on the right heel currently. 2. I am also can recommend we continue to monitor the deep tissue injury on the left heel. 3. I am also going to suggest that the patient should be offloaded appropriately this includes the Prevalon offloading boots and I am also going to suggest the patient should have a pillow placed underneath her calf region in order to offload and keep pressure off of the heels as well when she is in bed. We will see patient back for reevaluation in 1 week here in the clinic. If anything worsens or changes patient will contact our office for additional recommendations. Electronic Signature(s) Signed: 02/15/2021 5:39:20 PM By: Worthy Keeler PA-C Entered By: Worthy Keeler on 02/15/2021 17:39:20 Vita, [name] [name] ([id number]) -------------------------------------------------------------------------------- SuperBill Details Patient Name: [name] [name] Date of Service:  02/15/2021 Medical Record Number: [id number] Patient Account Number: [id number] Date of Birth/Sex: [birth year] (87 y.o. F) Treating RN: Levora Dredge Primary Care Provider: Kelton Pillar Other Clinician: Cornell Barman Referring Provider: Kelton Pillar Treating Provider/Extender: Jeri Cos Weeks in Treatment: 7 Diagnosis Coding ICD-10 Codes Code Description [phone] Pressure ulcer of right heel, stage 3 S30.810A Abrasion of lower back and pelvis, initial encounter E11.622 Type 2 diabetes mellitus with other skin ulcer F01.50 Vascular dementia without behavioral disturbance Facility Procedures CPT4 Code: [id number] Description: [phone] - WOUND CARE VISIT-LEV 2 EST PT Modifier: Quantity: 1 Physician Procedures CPT4 Code: [id number] Description: [id number] - WC PHYS LEVEL 4 - EST PT Modifier: Quantity: 1 CPT4 Code: Description: ICD-10 Diagnosis Description L89.613 Pressure ulcer of right heel, stage 3 S30.810A Abrasion of lower back and pelvis, initial encounter E11.622 Type 2 diabetes mellitus with other skin ulcer F01.50 Vascular dementia without behavioral  disturbance Modifier: Quantity: Electronic Signature(s) Signed: 02/15/2021 5:39:44 PM By: Worthy Keeler PA-C Entered By: Worthy Keeler on 02/15/2021 17:39:44

## 2021-02-17 NOTE — Progress Notes (Signed)
Cardiology Office Note:    Date:  02/21/2021   ID:  [name], DOB December 05, 1931, MRN [id number]  PCP:  Kelton Pillar, MD   [facility] Providers Cardiologist:  Fransico Him, MD {   Referring MD: Kelton Pillar, MD    History of Present Illness:    [name] is a 87 y.o. female with a hx of Barretts esophagus, breast cancer, CKD stage 2, DMII, dementia, HTN, HLD and pulmonary HTN who was referred by Dr. Laurann Montana for further evaluation of SOB and LE edema.  Today, the patient states has been feeling overall okay. Has occasional dyspnea and LE edema. Denies any chest pain, orthopnea, PND, lightheadedness or dizziness. Unfortunately, history is limited due to dementia and her daughter recently had a stroke and she does not remember her symptoms.   No known history of CAD or HF.   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 unsure why this is noted in her hx.     Intraductal carcinoma in situ of right breast    Memory loss    Mild mood disorder (HCC)    Non-toxic goiter    Normal cardiac stress test 2000   done at [facility]   Obesity    Osteoarthritis    Personal history of malignant neoplasm of breast    Pulmonary hypertension ([place])    Thyroid nodule 2007   biopsy- benign   Trouble walking    UI (urinary incontinence)    Visual hallucinations    Vitamin D deficiency     Past Surgical History:  Procedure Laterality Date   ADENOIDECTOMY     BREAST BIOPSY   1974   right   BREAST BIOPSY  1977   left   BREAST BIOPSY  2012   Low grade ductal carcinoma ( needle loc Dr Melanee Spry)   BREAST EXCISIONAL BIOPSY Right    BREAST LUMPECTOMY  01/22/2011   BREAST LUMPECTOMY  114/14/2012   Procedure: LUMPECTOMY;  Surgeon: Haywood Lasso, MD;  Location: [place];  Service: General;  Laterality: Left;  Needle localization  @ [facility]  12:30   BREAST LUMPECTOMY W/ NEEDLE LOCALIZATION  01/22/2011   Left - Dr Margot Chimes   COLONOSCOPY  2009   EYE SURGERY Left    laser for glaucoma   TONSILLECTOMY  1944   TOTAL KNEE ARTHROPLASTY Right 07/09/2015   Procedure: RIGHT TOTAL KNEE ARTHROPLASTY;  Surgeon: Vickey Huger, MD;  Location: [place];  Service: Orthopedics;  Laterality: Right;   VAGINAL HYSTERECTOMY     partial    Current Medications: Current Meds  Medication Sig   Calcium Carbonate-Vitamin D (CALCIUM-D PO) Take 1 tablet by mouth daily.   escitalopram (LEXAPRO) 20 MG tablet Take 1 tablet (20 mg total) by mouth at bedtime.   memantine (NAMENDA) 10 MG tablet Take 1 tablet  daily   metFORMIN (GLUCOPHAGE-XR) 500 MG 24 hr tablet Take 500 mg by mouth daily.   Multiple Vitamins-Minerals (MULTIVITAMIN WITH MINERALS) tablet Take 1 tablet by mouth daily.   simvastatin (ZOCOR) 40 MG tablet Take 40 mg by mouth daily.   TRAVATAN Z 0.004 % SOLN ophthalmic solution Place 1 drop into both eyes at bedtime.     Allergies:   Peanut-containing drug products, Penicillins, Shellfish allergy, and Tape   Social History   Socioeconomic History   Marital status: Divorced    Spouse name: Not on file   Number of children: 1   Years of education: Not on file   Highest education level: Not on file  Occupational History   Occupation: college professor    Comment: Retired    Fish farm manager: RETIRED  Tobacco Use   Smoking status: Former    Types: Cigarettes    Quit date: 03/10/1981    Years since quitting: 39.9   Smokeless tobacco: Never  Vaping Use   Vaping Use: Never used  Substance and Sexual  Activity   Alcohol use: No   Drug use: No   Sexual activity: Not Currently  Other Topics Concern   Not on file  Social History Narrative   Lives with daughter and two grandchildren      College education      Left handed            Social Determinants of Health   Financial Resource Strain: Not on file  Food Insecurity: Not on file  Transportation Needs: Not on file  Physical Activity: Not on file  Stress: Not on file  Social Connections: Not on file     Family History: The patient's family history includes Breast cancer in her maternal grandmother; Cancer in her paternal aunt; Cancer (age of onset: 26) in her maternal grandmother; Cancer (age of onset: 14) in her paternal uncle.  ROS:   Please see the history of present illness.    Review of Systems  Constitutional:  Negative for fever.  Respiratory:  Positive for shortness of breath.   Cardiovascular:  Positive for leg swelling. Negative for chest pain, palpitations, orthopnea, claudication and PND.  Gastrointestinal:  Negative for nausea and vomiting.  Musculoskeletal:  Positive for falls.  Neurological:  Negative for dizziness and loss of consciousness.    EKGs/Labs/Other Studies Reviewed:    The following studies were reviewed today: No cardiac studies  EKG:  No new tracing  Recent Labs: 10/27/2020: ALT 11; BUN 24; Creatinine, Ser 1.34; Hemoglobin 12.5; Platelets 206; Potassium 3.7; Sodium 138  Recent Lipid Panel No results found for: CHOL, TRIG, HDL, CHOLHDL, VLDL, LDLCALC, LDLDIRECT   Risk Assessment/Calculations:           Physical Exam:    VS:  BP 130/72   Pulse 77   Ht 5\' 3"  (1.6 m)   SpO2 95%   BMI 34.84 kg/m     Wt Readings from Last 3 Encounters:  10/27/20 196 lb 10.4 oz (89.2 kg)  06/12/20 196 lb 9.6 oz (89.2 kg)  10/11/19 192 lb (87.1 kg)     GEN: Elderly female, wheelchair bound, comfortable HEENT: Normal NECK: No JVD; No carotid bruits CARDIAC: RRR, 2/6 systolic  murmur RESPIRATORY:  Clear to auscultation without rales, wheezing or rhonchi  ABDOMEN: Soft, non-tender, non-distended MUSCULOSKELETAL:  Trace-1+ LE edema; No deformity  SKIN: Warm and dry NEUROLOGIC:  Alert and oriented x 3 PSYCHIATRIC:  Normal affect   ASSESSMENT:    1. SOB (  shortness of breath)   2. Hypertension, unspecified type   3. Bilateral lower extremity edema   4. Type 2 diabetes mellitus without complication, with long-term current use of insulin ([place])   5. Mixed hyperlipidemia    PLAN:    In order of problems listed above:  #SOB #LE Edema: Unfortunately history very limited as patient has dementia and the daughter who is the primary caretaker just had a stroke and cannot remember what happened. Per PCP note, the patient was having more dyspnea when moving to the wheelchair and was also experiencing lower extremity swelling. Currently, she denies any chest pain, orthopnea, PND or SOB. She is not very active and is wheelchair bound at baseline. No known history of CAD or HF. -Check TTE -Check BNP  #HLD: -Simva 40mg  daily  #DMII: A1C 6 on metformin. Given that she missed her appointment with her PCP where her labs were supposed to be performed, will check routine labs today per patient request. -CBC -BMET  #Hair Loss:  Daughter reports the patient has been having increased hair loss. Due to inability to make PCP appointment, will check TSH today and follow-up with PCP as scheduled. -Check TSH        Medication Adjustments/Labs and Tests Ordered: Current medicines are reviewed at length with the patient today.  Concerns regarding medicines are outlined above.  Orders Placed This Encounter  Procedures   CBC   Basic metabolic panel   Pro b natriuretic peptide   TSH   ECHOCARDIOGRAM COMPLETE   No orders of the defined types were placed in this encounter.   Patient Instructions  Medication Instructions:   Your physician recommends that you continue on  your current medications as directed. Please refer to the Current Medication list given to you today.  *If you need a refill on your cardiac medications before your next appointment, please call your pharmacy*   Lab Work:  TODAY--BMET, CBC, TSH, AND PRO-BNP  If you have labs (blood work) drawn today and your tests are completely normal, you will receive your results only by: [place] (if you have MyChart) OR A paper copy in the mail If you have any lab test that is abnormal or we need to change your treatment, we will call you to review the results.   Testing/Procedures:  Your physician has requested that you have an echocardiogram. Echocardiography is a painless test that uses sound waves to create images of your heart. It provides your doctor with information about the size and shape of your heart and how well your heart's chambers and valves are working. This procedure takes approximately one hour. There are no restrictions for this procedure.    Follow-Up:  3 MONTHS WITH AN EXTENDER     Signed, Freada Bergeron, MD  02/21/2021 4:58 PM    [place]

## 2021-02-21 ENCOUNTER — Other Ambulatory Visit: Payer: Self-pay

## 2021-02-21 ENCOUNTER — Ambulatory Visit: Payer: Medicare Other | Admitting: Cardiology

## 2021-02-21 ENCOUNTER — Encounter: Payer: Self-pay | Admitting: Cardiology

## 2021-02-21 VITALS — BP 130/72 | HR 77 | Ht 63.0 in

## 2021-02-21 DIAGNOSIS — I10 Essential (primary) hypertension: Secondary | ICD-10-CM | POA: Diagnosis not present

## 2021-02-21 DIAGNOSIS — R0602 Shortness of breath: Secondary | ICD-10-CM

## 2021-02-21 DIAGNOSIS — Z794 Long term (current) use of insulin: Secondary | ICD-10-CM

## 2021-02-21 DIAGNOSIS — R600 Localized edema: Secondary | ICD-10-CM | POA: Diagnosis not present

## 2021-02-21 DIAGNOSIS — R6889 Other general symptoms and signs: Secondary | ICD-10-CM | POA: Diagnosis not present

## 2021-02-21 DIAGNOSIS — E119 Type 2 diabetes mellitus without complications: Secondary | ICD-10-CM

## 2021-02-21 DIAGNOSIS — E782 Mixed hyperlipidemia: Secondary | ICD-10-CM

## 2021-02-21 NOTE — Patient Instructions (Signed)
Medication Instructions:   Your physician recommends that you continue on your current medications as directed. Please refer to the Current Medication list given to you today.  *If you need a refill on your cardiac medications before your next appointment, please call your pharmacy*   Lab Work:  TODAY--BMET, CBC, TSH, AND PRO-BNP  If you have labs (blood work) drawn today and your tests are completely normal, you will receive your results only by: [place] (if you have MyChart) OR A paper copy in the mail If you have any lab test that is abnormal or we need to change your treatment, we will call you to review the results.   Testing/Procedures:  Your physician has requested that you have an echocardiogram. Echocardiography is a painless test that uses sound waves to create images of your heart. It provides your doctor with information about the size and shape of your heart and how well your hearts chambers and valves are working. This procedure takes approximately one hour. There are no restrictions for this procedure.    Follow-Up:  3 MONTHS WITH AN EXTENDER

## 2021-02-22 ENCOUNTER — Encounter: Payer: Medicare Other | Admitting: Physician Assistant

## 2021-02-22 DIAGNOSIS — E11622 Type 2 diabetes mellitus with other skin ulcer: Secondary | ICD-10-CM | POA: Diagnosis not present

## 2021-02-22 DIAGNOSIS — E119 Type 2 diabetes mellitus without complications: Secondary | ICD-10-CM | POA: Diagnosis not present

## 2021-02-22 DIAGNOSIS — R6889 Other general symptoms and signs: Secondary | ICD-10-CM | POA: Diagnosis not present

## 2021-02-22 DIAGNOSIS — S30810A Abrasion of lower back and pelvis, initial encounter: Secondary | ICD-10-CM | POA: Diagnosis not present

## 2021-02-22 DIAGNOSIS — L89613 Pressure ulcer of right heel, stage 3: Secondary | ICD-10-CM | POA: Diagnosis not present

## 2021-02-22 LAB — CBC
Hematocrit: 31.8 % — ABNORMAL LOW (ref 34.0–46.6)
Hemoglobin: 9.9 g/dL — ABNORMAL LOW (ref 11.1–15.9)
MCH: 27 pg (ref 26.6–33.0)
MCHC: 31.1 g/dL — ABNORMAL LOW (ref 31.5–35.7)
MCV: 87 fL (ref 79–97)
Platelets: 264 10*3/uL (ref 150–450)
RBC: 3.66 x10E6/uL — ABNORMAL LOW (ref 3.77–5.28)
RDW: 14.3 % (ref 11.7–15.4)
WBC: 6.3 10*3/uL (ref 3.4–10.8)

## 2021-02-22 LAB — PRO B NATRIURETIC PEPTIDE: NT-Pro BNP: 306 pg/mL (ref 0–738)

## 2021-02-22 LAB — BASIC METABOLIC PANEL
BUN/Creatinine Ratio: 20 (ref 12–28)
BUN: 16 mg/dL (ref 8–27)
CO2: 26 mmol/L (ref 20–29)
Calcium: 9.6 mg/dL (ref 8.7–10.3)
Chloride: 107 mmol/L — ABNORMAL HIGH (ref 96–106)
Creatinine, Ser: 0.81 mg/dL (ref 0.57–1.00)
Glucose: 87 mg/dL (ref 70–99)
Potassium: 4 mmol/L (ref 3.5–5.2)
Sodium: 145 mmol/L — ABNORMAL HIGH (ref 134–144)
eGFR: 70 mL/min/{1.73_m2} (ref >59)

## 2021-02-22 LAB — TSH: TSH: 2.34 u[IU]/mL (ref 0.450–4.500)

## 2021-02-27 NOTE — Progress Notes (Signed)
[name], [name] ([id number]) Visit Report for 02/22/2021 Chief Complaint Document Details Patient Name: [name]. Date of Service: 02/22/2021 3:00 PM Medical Record Number: [id number] Patient Account Number: [id number] Date of Birth/Sex: [birth year] (87 y.o. F) Treating RN: Levora Dredge Primary Care Provider: Kelton Pillar Other Clinician: Referring Provider: Kelton Pillar Treating Provider/Extender: Skipper Cliche in Treatment: 8 Information Obtained from: Patient Chief Complaint Right heel pressure ulcer Electronic Signature(s) Signed: 02/22/2021 4:06:26 PM By: Worthy Keeler PA-C Entered By: Worthy Keeler on 02/22/2021 16:06:26 [name] ([id number]) -------------------------------------------------------------------------------- HPI Details Patient Name: [name] Date of Service: 02/22/2021 3:00 PM Medical Record Number: [id number] Patient Account Number: [id number] Date of Birth/Sex: [birth year] (87 y.o. F) Treating RN: Levora Dredge Primary Care Provider: Kelton Pillar Other Clinician: Referring Provider: Kelton Pillar Treating Provider/Extender: Skipper Cliche in Treatment: 8 History of Present Illness HPI Description: 12/25/20 upon evaluation today patient presents for a wound on her heel which has been present for about 1 and half months or so according to her daughter. The patient does have dementia. With that being said her daughter is the primary caregiver and seems to be doing an excellent job. She had a fairly significant wound on the heel with eschar covering however this was lifting up and it appears to be mostly healed underneath the biggest issue is on the posterior aspect of the heel where she has a small slit of an opening in the Achilles region which is actually what is mainly painful for her today. We do not have a hemoglobin A1c as the patient actually is seen by a provider outside of the [facility] system so I am not able to look this up.  With that being said I do not see any evidence of active infection and I do think that she is actually doing quite well I think her daughter is doing a great job taking care of the wound in particular. With regard to her past medical history she does have dementia and diabetes mellitus type 2 but otherwise no major medical problems. She did have an x-ray performed 11/27/2020 which was negative for any signs of osteomyelitis. 01/03/2021 upon evaluation today patient appears to be doing well with regard to her heel ulcer. She has been tolerating the dressing changes without complication the collagen seems to be doing a good job for her. I do not see any signs of infection at this point. Unfortunately the patient is stated to have a wound on her bottom although her daughter is not sure how bad this really is next week we will get a get her into a room with a bed so that we can actually check this out I do not want things to get worse unnecessarily. 01/10/2021 upon evaluation today patient's wound actually appears to be doing well in regard to her heel there is some slough noted I Georgina Peer clear this away with some sharp debridement today. With regard to the gluteal region there is some abrasion noted here. This seems to be very superficial and I think it is from her scratching mainly they have been using Desitin I think she could benefit from possibly utilizing some AandE ointment which I think could be of benefit for her. Fortunately there does not appear to be any signs of active infection systemically which is great news. 01/17/2021 upon evaluation today patient's wound on the gluteal region actually appears to be healed and everything is doing quite well. I am very pleased in that regard.  With regard to the wound on her heel that is measuring smaller and looking much better and very pleased with what I see as well today. 01/25/2021 upon evaluation today patient appears to be doing well in regard to her  wounds. She has been actually healing quite nicely in regard to the heel and the gluteal region also seems to be doing quite well. I do not see any evidence of infection which is great. She had several areas of scratching that she had been messing with but again nothing that appears to be open at the this point. 02/15/2021 upon evaluation patient's wounds actually appear to be doing decently well. At this point I am still concerned about the pressure ulcer on her right heel. This does have some eschar overlying I Georgina Peer try to clear this away I am unsure how much is really open underneath though there appears to be some deep tissue injury around as well. Her daughter who generally takes care of her actually unfortunately had a stroke she is with her today but nonetheless is not able to do as much and does not remember as much about what is been going on treatment wise as she otherwise would. 02/22/2021 upon evaluation today patient's wound on the heel actually was doing excellent. Unfortunately she does have a deep tissue injury on the left heel which is not good and not a good sign. This is something her daughter needs to keep a close eye on we do not want this to get worse at all. In fact I am hoping it will not even reopen up completely but again time will tell we never know until it is all said and done. Electronic Signature(s) Signed: 02/22/2021 5:07:01 PM By: Worthy Keeler PA-C Entered By: Worthy Keeler on 02/22/2021 17:07:01 [name] ([id number]) -------------------------------------------------------------------------------- Physical Exam Details Patient Name: [name] Date of Service: 02/22/2021 3:00 PM Medical Record Number: [id number] Patient Account Number: [id number] Date of Birth/Sex: 1931/10/22 (87 y.o. F) Treating RN: Levora Dredge Primary Care Provider: Kelton Pillar Other Clinician: Referring Provider: Kelton Pillar Treating Provider/Extender: Skipper Cliche in Treatment: 8 Constitutional Well-nourished and well-hydrated in no acute distress. Respiratory normal breathing without difficulty. Psychiatric this patient is able to make decisions and demonstrates good insight into disease process. Alert and Oriented x 3. pleasant and cooperative. Notes Upon inspection patient's wound bed showed signs again of good granulation epithelization in regard to the heel I am very pleased with where things stand here today. With regard to the deep tissue injury on the left leg I think this is something that needs to be addressed as far as cushioning is concerned with his Zetuvit offloading border foam dressing. I think that skin to still be the optimal thing to do currently. Electronic Signature(s) Signed: 02/22/2021 5:07:35 PM By: Worthy Keeler PA-C Entered By: Worthy Keeler on 02/22/2021 17:07:35 Tisdell, [name] ([id number]) -------------------------------------------------------------------------------- Physician Orders Details Patient Name: [name] Date of Service: 02/22/2021 3:00 PM Medical Record Number: [id number] Patient Account Number: [id number] Date of Birth/Sex: 05-07-1931 (87 y.o. F) Treating RN: Levora Dredge Primary Care Provider: Kelton Pillar Other Clinician: Referring Provider: Kelton Pillar Treating Provider/Extender: Skipper Cliche in Treatment: 8 Verbal / Phone Orders: No Diagnosis Coding Follow-up Appointments o Return Appointment in 2 weeks. - after holiday Bathing/ Shower/ Hygiene o May shower; gently cleanse wound with antibacterial soap, rinse and pat dry prior to dressing wounds Off-Loading o Gel wheelchair cushion - Recommended o Turn  and reposition every 2 hours - reposition every 2 hrs to prevent skin breakdown o Other: - Patient to purchase slippers with rubber soles. Education provided on measures to decrease scratching. Keeping heels protected. Additional Orders / Instructions o  Follow Nutritious Diet and Increase Protein Intake Wound Treatment Wound #1 - Calcaneus Wound Laterality: Right Cleanser: Normal Saline 3 x Per Week/30 Days Discharge Instructions: Wash your hands with soap and water. Remove old dressing, discard into plastic bag and place into trash. Cleanse the wound with Normal Saline prior to applying a clean dressing using gauze sponges, not tissues or cotton balls. Do not scrub or use excessive force. Pat dry using gauze sponges, not tissue or cotton balls. Primary Dressing: Prisma 4.34 (in) 3 x Per Week/30 Days Discharge Instructions: Moisten w/normal saline or sterile water; Cover wound as directed. Do not remove from wound bed. Primary Dressing: Zetuvit Plus Silicone Border Dressing 5x5 (in/in) (Generic) 3 x Per Week/30 Days Discharge Instructions: Secure the collagen Electronic Signature(s) Signed: 02/22/2021 5:41:04 PM By: Worthy Keeler PA-C Signed: 02/27/2021 12:56:33 PM By: Levora Dredge Entered By: Levora Dredge on 02/22/2021 16:06:06 Hamberger, [name] ([id number]) -------------------------------------------------------------------------------- Problem List Details Patient Name: [name] Date of Service: 02/22/2021 3:00 PM Medical Record Number: [id number] Patient Account Number: [id number] Date of Birth/Sex: 1932-01-31 (87 y.o. F) Treating RN: Levora Dredge Primary Care Provider: Kelton Pillar Other Clinician: Referring Provider: Kelton Pillar Treating Provider/Extender: Skipper Cliche in Treatment: 8 Active Problems ICD-10 Encounter Code Description Active Date MDM Diagnosis L89.613 Pressure ulcer of right heel, stage 3 12/25/2020 No Yes S30.810A Abrasion of lower back and pelvis, initial encounter 01/10/2021 No Yes E11.622 Type 2 diabetes mellitus with other skin ulcer 12/25/2020 No Yes F01.50 Vascular dementia without behavioral disturbance 12/25/2020 No Yes Inactive Problems Resolved Problems Electronic  Signature(s) Signed: 02/22/2021 4:06:17 PM By: Worthy Keeler PA-C Entered By: Worthy Keeler on 02/22/2021 16:06:17 Deatley, [name] ([id number]) -------------------------------------------------------------------------------- Progress Note Details Patient Name: [name] Date of Service: 02/22/2021 3:00 PM Medical Record Number: [id number] Patient Account Number: [id number] Date of Birth/Sex: 12-10-1931 (87 y.o. F) Treating RN: Levora Dredge Primary Care Provider: Kelton Pillar Other Clinician: Referring Provider: Kelton Pillar Treating Provider/Extender: Skipper Cliche in Treatment: 8 Subjective Chief Complaint Information obtained from Patient Right heel pressure ulcer History of Present Illness (HPI) 12/25/20 upon evaluation today patient presents for a wound on her heel which has been present for about 1 and half months or so according to her daughter. The patient does have dementia. With that being said her daughter is the primary caregiver and seems to be doing an excellent job. She had a fairly significant wound on the heel with eschar covering however this was lifting up and it appears to be mostly healed underneath the biggest issue is on the posterior aspect of the heel where she has a small slit of an opening in the Achilles region which is actually what is mainly painful for her today. We do not have a hemoglobin A1c as the patient actually is seen by a provider outside of the [facility] system so I am not able to look this up. With that being said I do not see any evidence of active infection and I do think that she is actually doing quite well I think her daughter is doing a great job taking care of the wound in particular. With regard to her past medical history she does have dementia and diabetes mellitus type 2 but otherwise  no major medical problems. She did have an x-ray performed 11/27/2020 which was negative for any signs of osteomyelitis. 01/03/2021 upon  evaluation today patient appears to be doing well with regard to her heel ulcer. She has been tolerating the dressing changes without complication the collagen seems to be doing a good job for her. I do not see any signs of infection at this point. Unfortunately the patient is stated to have a wound on her bottom although her daughter is not sure how bad this really is next week we will get a get her into a room with a bed so that we can actually check this out I do not want things to get worse unnecessarily. 01/10/2021 upon evaluation today patient's wound actually appears to be doing well in regard to her heel there is some slough noted I Georgina Peer clear this away with some sharp debridement today. With regard to the gluteal region there is some abrasion noted here. This seems to be very superficial and I think it is from her scratching mainly they have been using Desitin I think she could benefit from possibly utilizing some AandE ointment which I think could be of benefit for her. Fortunately there does not appear to be any signs of active infection systemically which is great news. 01/17/2021 upon evaluation today patient's wound on the gluteal region actually appears to be healed and everything is doing quite well. I am very pleased in that regard. With regard to the wound on her heel that is measuring smaller and looking much better and very pleased with what I see as well today. 01/25/2021 upon evaluation today patient appears to be doing well in regard to her wounds. She has been actually healing quite nicely in regard to the heel and the gluteal region also seems to be doing quite well. I do not see any evidence of infection which is great. She had several areas of scratching that she had been messing with but again nothing that appears to be open at the this point. 02/15/2021 upon evaluation patient's wounds actually appear to be doing decently well. At this point I am still concerned about the  pressure ulcer on her right heel. This does have some eschar overlying I Georgina Peer try to clear this away I am unsure how much is really open underneath though there appears to be some deep tissue injury around as well. Her daughter who generally takes care of her actually unfortunately had a stroke she is with her today but nonetheless is not able to do as much and does not remember as much about what is been going on treatment wise as she otherwise would. 02/22/2021 upon evaluation today patient's wound on the heel actually was doing excellent. Unfortunately she does have a deep tissue injury on the left heel which is not good and not a good sign. This is something her daughter needs to keep a close eye on we do not want this to get worse at all. In fact I am hoping it will not even reopen up completely but again time will tell we never know until it is all said and done. Objective Constitutional Well-nourished and well-hydrated in no acute distress. Vitals Time Taken: 3:15 PM, Height: 63 in, Weight: 196 lbs, BMI: 34.7, Temperature: 97.7 F, Pulse: 91 bpm, Respiratory Rate: 18 breaths/min, Blood Pressure: 140/84 mmHg. Respiratory [name], [name] ([id number]) normal breathing without difficulty. Psychiatric this patient is able to make decisions and demonstrates good insight into disease process. Alert  and Oriented x 3. pleasant and cooperative. General Notes: Upon inspection patient's wound bed showed signs again of good granulation epithelization in regard to the heel I am very pleased with where things stand here today. With regard to the deep tissue injury on the left leg I think this is something that needs to be addressed as far as cushioning is concerned with his Zetuvit offloading border foam dressing. I think that skin to still be the optimal thing to do currently. Integumentary (Hair, Skin) Wound #1 status is Open. Original cause of wound was Pressure Injury. The date acquired was:  12/25/2020. The wound has been in treatment 8 weeks. The wound is located on the Right Calcaneus. The wound measures 0.4cm length x 0.6cm width x 0.1cm depth; 0.188cm^2 area and 0.019cm^3 volume. There is Fat Layer (Subcutaneous Tissue) exposed. There is no tunneling or undermining noted. There is a medium amount of serosanguineous drainage noted. There is large (67-100%) pink granulation within the wound bed. There is a small (1-33%) amount of necrotic tissue within the wound bed including Adherent Slough. Other Condition(s) Patient presents with Suspected Deep Tissue Injury located on the Left Foot. General Notes: 12/16 covered with zetuvit with silicone boarder Patient presents with Suspected Deep Tissue Injury located on the Left Leg. Assessment Active Problems ICD-10 Pressure ulcer of right heel, stage 3 Abrasion of lower back and pelvis, initial encounter Type 2 diabetes mellitus with other skin ulcer Vascular dementia without behavioral disturbance Plan Follow-up Appointments: Return Appointment in 2 weeks. - after holiday Bathing/ Shower/ Hygiene: May shower; gently cleanse wound with antibacterial soap, rinse and pat dry prior to dressing wounds Off-Loading: Gel wheelchair cushion - Recommended Turn and reposition every 2 hours - reposition every 2 hrs to prevent skin breakdown Other: - Patient to purchase slippers with rubber soles. Education provided on measures to decrease scratching. Keeping heels protected. Additional Orders / Instructions: Follow Nutritious Diet and Increase Protein Intake WOUND #1: - Calcaneus Wound Laterality: Right Cleanser: Normal Saline 3 x Per Week/30 Days Discharge Instructions: Wash your hands with soap and water. Remove old dressing, discard into plastic bag and place into trash. Cleanse the wound with Normal Saline prior to applying a clean dressing using gauze sponges, not tissues or cotton balls. Do not scrub or use excessive force. Pat dry  using gauze sponges, not tissue or cotton balls. Primary Dressing: Prisma 4.34 (in) 3 x Per Week/30 Days Discharge Instructions: Moisten w/normal saline or sterile water; Cover wound as directed. Do not remove from wound bed. Primary Dressing: Zetuvit Plus Silicone Border Dressing 5x5 (in/in) (Generic) 3 x Per Week/30 Days Discharge Instructions: Secure the collagen 1. I would recommend that we going continue with the wound care measures as before the patient is in agreement with the plan this includes the use of the silver collagen dressing to the heel which is doing great on the right. 2. We will cover this with a border foam dressing. 3. Also can recommend a border foam dressing on the deep tissue injury on the left heel and this needs to be monitored closely for additional pressure injury we do not want this to open. Her daughter was informed. We will see patient back for reevaluation in 2 weeks here in the clinic. If anything worsens or changes patient will contact our office for additional recommendations. Puchalski, [name] ([id number]) Electronic Signature(s) Signed: 02/22/2021 5:08:28 PM By: Worthy Keeler PA-C Entered By: Worthy Keeler on 02/22/2021 17:08:28 Lalani, [name] ([id number]) --------------------------------------------------------------------------------  SuperBill Details Patient Name: Rachal, [name]. Date of Service: 02/22/2021 Medical Record Number: [id number] Patient Account Number: [id number] Date of Birth/Sex: 1931/07/30 (87 y.o. F) Treating RN: Levora Dredge Primary Care Provider: Kelton Pillar Other Clinician: Referring Provider: Kelton Pillar Treating Provider/Extender: Skipper Cliche in Treatment: 8 Diagnosis Coding ICD-10 Codes Code Description [id number] Pressure ulcer of right heel, stage 3 S30.810A Abrasion of lower back and pelvis, initial encounter E11.622 Type 2 diabetes mellitus with other skin ulcer F01.50 Vascular dementia without behavioral  disturbance Facility Procedures CPT4 Code: [id number] Description: [id number] - WOUND CARE VISIT-LEV 2 EST PT Modifier: Quantity: 1 Physician Procedures CPT4 Code: [id number] Description: 82500 - WC PHYS LEVEL 4 - EST PT Modifier: Quantity: 1 CPT4 Code: Description: ICD-10 Diagnosis Description L89.613 Pressure ulcer of right heel, stage 3 S30.810A Abrasion of lower back and pelvis, initial encounter E11.622 Type 2 diabetes mellitus with other skin ulcer F01.50 Vascular dementia without behavioral  disturbance Modifier: Quantity: Electronic Signature(s) Signed: 02/22/2021 5:11:40 PM By: Worthy Keeler PA-C Entered By: Worthy Keeler on 02/22/2021 17:11:39

## 2021-02-27 NOTE — Progress Notes (Signed)
Panjwani, Khyler E. ([id number]) °Visit Report for 02/22/2021 °Arrival Information Details °Patient Name: [name], [name] °Date of Service: 02/22/2021 3:00 PM °Medical Record Number: [id number] °Patient Account Number: [id number] °Date of Birth/Sex: 03/02/1932 (87 y.o. F) °Treating RN: Gordon, Caitlin °Primary Care : Griffin, [name] Other Clinician: °Referring : Griffin, [name] °Treating /Extender: Stone, Hoyt °Weeks in Treatment: 8 °Visit Information History Since Last Visit °Added or deleted any medications: No °Patient Arrived: Wheel Chair °Any new allergies or adverse reactions: No °Arrival Time: 15:11 °Had a fall or experienced change in No °Accompanied By: daughter °activities of daily living that may affect °Transfer Assistance: Other °risk of falls: °Patient Identification Verified: Yes °Hospitalized since last visit: No °Secondary Verification Process Completed: Yes °Has Dressing in Place as Prescribed: Yes °Patient Has Alerts: Yes °Pain Present Now: No °Patient Alerts: Type II Diabetic °HOYER/put in chair °Electronic Signature(s) °Signed: 02/22/2021 4:28:49 PM By: Bishop, Joy °Entered By: Bishop, Joy on 02/22/2021 16:28:49 °Churilla, Onesha E. ([id number]) °-------------------------------------------------------------------------------- °Clinic Level of Care Assessment Details °Patient Name: Laforest, [name] °Date of Service: 02/22/2021 3:00 PM °Medical Record Number: [id number] °Patient Account Number: [id number] °Date of Birth/Sex: 10/06/1931 (87 y.o. F) °Treating RN: Gordon, Caitlin °Primary Care : Griffin, [name] Other Clinician: °Referring : Griffin, [name] °Treating /Extender: Stone, Hoyt °Weeks in Treatment: 8 °Clinic Level of Care Assessment Items °TOOL 4 Quantity Score °X - Use when only an EandM is performed on FOLLOW-UP visit 1 0 °ASSESSMENTS - Nursing Assessment / Reassessment °[] - Reassessment of Co-morbidities (includes updates in patient status) 0 °[] - 0 °Reassessment  of Adherence to Treatment Plan °ASSESSMENTS - Wound and Skin Assessment / Reassessment °X - Simple Wound Assessment / Reassessment - one wound 1 5 °[] - 0 °Complex Wound Assessment / Reassessment - multiple wounds °[] - 0 °Dermatologic / Skin Assessment (not related to wound area) °ASSESSMENTS - Focused Assessment °[] - Circumferential Edema Measurements - multi extremities 0 °[] - 0 °Nutritional Assessment / Counseling / Intervention °[] - 0 °Lower Extremity Assessment (monofilament, tuning fork, pulses) °[] - 0 °Peripheral Arterial Disease Assessment (using hand held doppler) °ASSESSMENTS - Ostomy and/or Continence Assessment and Care °[] - Incontinence Assessment and Management 0 °[] - 0 °Ostomy Care Assessment and Management (repouching, etc.) °PROCESS - Coordination of Care °X - Simple Patient / Family Education for ongoing care 1 15 °[] - 0 °Complex (extensive) Patient / Family Education for ongoing care °[] - 0 °Staff obtains Consents, Records, Test Results / Process Orders °[] - 0 °Staff telephones HHA, Nursing Homes / Clarify orders / etc °[] - 0 °Routine Transfer to another Facility (non-emergent condition) °[] - 0 °Routine Hospital Admission (non-emergent condition) °[] - 0 °New Admissions / Insurance Authorizations / Ordering NPWT, Apligraf, etc. °[] - 0 °Emergency Hospital Admission (emergent condition) °X- 1 10 °Simple Discharge Coordination °[] - 0 °Complex (extensive) Discharge Coordination °PROCESS - Special Needs °[] - Pediatric / Minor Patient Management 0 °[] - 0 °Isolation Patient Management °[] - 0 °Hearing / Language / Visual special needs °[] - 0 °Assessment of Community assistance (transportation, D/C planning, etc.) °[] - 0 °Additional assistance / Altered mentation °[] - 0 °Support Surface(s) Assessment (bed, cushion, seat, etc.) °INTERVENTIONS - Wound Cleansing / Measurement °Roedl, Chevonne E. ([id number]) °X- 1 5 °Simple Wound Cleansing - one wound °[] - 0 °Complex Wound Cleansing - multiple  wounds °X- 1 5 °Wound Imaging (photographs - any number of wounds) °[] - 0 °Wound Tracing (instead of photographs) °X- 1 5 °Simple   Wound Measurement - one wound °[] - 0 °Complex Wound Measurement - multiple wounds °INTERVENTIONS - Wound Dressings °X - Small Wound Dressing one or multiple wounds 1 10 °[] - 0 °Medium Wound Dressing one or multiple wounds °[] - 0 °Large Wound Dressing one or multiple wounds °[] - 0 °Application of Medications - topical °[] - 0 °Application of Medications - injection °INTERVENTIONS - Miscellaneous °[] - External ear exam 0 °[] - 0 °Specimen Collection (cultures, biopsies, blood, body fluids, etc.) °[] - 0 °Specimen(s) / Culture(s) sent or taken to Lab for analysis °[] - 0 °Patient Transfer (multiple staff / Hoyer Lift / Similar devices) °[] - 0 °Simple Staple / Suture removal (25 or less) °[] - 0 °Complex Staple / Suture removal (26 or more) °[] - 0 °Hypo / Hyperglycemic Management (close monitor of Blood Glucose) °[] - 0 °Ankle / Brachial Index (ABI) - do not check if billed separately °X- 1 5 °Vital Signs °Has the patient been seen at the hospital within the last three years: Yes °Total Score: 60 °Level Of Care: New/Established - Level °2 °Electronic Signature(s) °Signed: 02/27/2021 12:56:33 PM By: Gordon, Caitlin °Entered By: Gordon, Caitlin on 02/22/2021 16:06:44 °Slawson, Tacori E. ([id number]) °-------------------------------------------------------------------------------- °Encounter Discharge Information Details °Patient Name: Sutter, [name] °Date of Service: 02/22/2021 3:00 PM °Medical Record Number: [id number] °Patient Account Number: [id number] °Date of Birth/Sex: 04/02/1931 (87 y.o. F) °Treating RN: Gordon, Caitlin °Primary Care : Griffin, [name] Other Clinician: °Referring : Griffin, [name] °Treating /Extender: Stone, Hoyt °Weeks in Treatment: 8 °Encounter Discharge Information Items °Discharge Condition: Stable °Ambulatory Status: Wheelchair °Discharge  Destination: Home °Transportation: Private Auto °Accompanied By: daughter °Schedule Follow-up Appointment: Yes °Clinical Summary of Care: °Electronic Signature(s) °Signed: 02/27/2021 12:56:33 PM By: Gordon, Caitlin °Entered By: Gordon, Caitlin on 02/22/2021 16:09:09 °Coon, Kaitlan E. ([id number]) °-------------------------------------------------------------------------------- °Lower Extremity Assessment Details °Patient Name: Leung, Zaiyah E. °Date of Service: 02/22/2021 3:00 PM °Medical Record Number: [id number] °Patient Account Number: [id number] °Date of Birth/Sex: 02/19/1932 (87 y.o. F) °Treating RN: Gordon, Caitlin °Primary Care : Griffin, [name] Other Clinician: °Referring : Griffin, [name] °Treating /Extender: Stone, Hoyt °Weeks in Treatment: 8 °Edema Assessment °Assessed: [Left: No] [Right: No] °[Left: Edema] [Right: :] °Calf °Left: Right: °Point of Measurement: 30 cm From Medial Instep 31.5 cm °Ankle °Left: Right: °Point of Measurement: 9 cm From Medial Instep 21.5 cm °Vascular Assessment °Pulses: °Dorsalis Pedis °Palpable: [Left:Yes] [Right:Yes] °Electronic Signature(s) °Signed: 02/27/2021 12:56:33 PM By: Gordon, Caitlin °Entered By: Gordon, Caitlin on 02/22/2021 15:37:38 °Belardo, Jolynda E. ([id number]) °-------------------------------------------------------------------------------- °Multi Wound Chart Details °Patient Name: Manville, Leontyne E. °Date of Service: 02/22/2021 3:00 PM °Medical Record Number: [id number] °Patient Account Number: [id number] °Date of Birth/Sex: 03/01/1932 (87 y.o. F) °Treating RN: Gordon, Caitlin °Primary Care : Griffin, [name] Other Clinician: °Referring : Griffin, [name] °Treating /Extender: Stone, Hoyt °Weeks in Treatment: 8 °Vital Signs °Height(in): 63 °Pulse(bpm): 91 °Weight(lbs): 196 °Blood Pressure(mmHg): 140/84 °Body Mass Index(BMI): 35 °Temperature(Â°F): 97.7 °Respiratory Rate(breaths/min): 18 °Photos: [N/A:N/A] °Wound Location: Right Calcaneus N/A  N/A °Wounding Event: Pressure Injury N/A N/A °Primary Etiology: Pressure Ulcer N/A N/A °Comorbid History: Hypertension, Type II Diabetes, N/A N/A °Osteoarthritis, Dementia °Date Acquired: 12/25/2020 N/A N/A °Weeks of Treatment: 8 N/A N/A °Wound Status: Open N/A N/A °Measurements L x W x D (cm) 0.4x0.6x0.1 N/A N/A °Area (cm²) : 0.188 N/A N/A °Volume (cm³) : 0.019 N/A N/A °% Reduction in Area: 25.10% N/A N/A °% Reduction in Volume: 62.00% N/A N/A °Classification: Unstageable/Unclassified N/A N/A °Exudate Amount: Medium N/A N/A °Exudate Type: Serosanguineous N/A   N/A Exudate Color: red, brown N/A N/A Granulation Amount: Large (67-100%) N/A N/A Granulation Quality: Pink N/A N/A Necrotic Amount: Small (1-33%) N/A N/A Exposed Structures: Fat Layer (Subcutaneous Tissue): N/A N/A Yes Fascia: No Tendon: No Muscle: No Joint: No Bone: No Epithelialization: None N/A N/A Treatment Notes Electronic Signature(s) Signed: 02/27/2021 12:56:33 PM By: Levora Dredge Entered By: Levora Dredge on 02/22/2021 16:02:10 Okelley, [name] ([id number]) -------------------------------------------------------------------------------- Multi-Disciplinary Care Plan Details Patient Name: [name] Date of Service: 02/22/2021 3:00 PM Medical Record Number: [id number] Patient Account Number: [id number] Date of Birth/Sex: March 16, 1931 (87 y.o. F) Treating RN: Levora Dredge Primary Care Gwenyth Dingee: Kelton Pillar Other Clinician: Referring Christipher Rieger: Kelton Pillar Treating Evelean Bigler/Extender: Skipper Cliche in Treatment: 8 Active Inactive Pressure Nursing Diagnoses: Knowledge deficit related to causes and risk factors for pressure ulcer development Knowledge deficit related to management of pressures ulcers Potential for impaired tissue integrity related to pressure, friction, moisture, and shear Goals: Patient will remain free from development of additional pressure ulcers Date Initiated: 12/25/2020 Target  Resolution Date: 01/25/2021 Goal Status: Active Patient will remain free of pressure ulcers Date Initiated: 12/25/2020 Target Resolution Date: 01/25/2021 Goal Status: Active Patient/caregiver will verbalize risk factors for pressure ulcer development Date Initiated: 12/25/2020 Target Resolution Date: 12/25/2020 Goal Status: Active Patient/caregiver will verbalize understanding of pressure ulcer management Date Initiated: 12/25/2020 Target Resolution Date: 12/25/2020 Goal Status: Active Interventions: Assess: immobility, friction, shearing, incontinence upon admission and as needed Assess offloading mechanisms upon admission and as needed Assess potential for pressure ulcer upon admission and as needed Provide education on pressure ulcers Notes: Wound/Skin Impairment Nursing Diagnoses: Impaired tissue integrity Goals: Patient/caregiver will verbalize understanding of skin care regimen Date Initiated: 12/25/2020 Date Inactivated: 01/25/2021 Target Resolution Date: 12/25/2020 Goal Status: Met Ulcer/skin breakdown will have a volume reduction of 30% by week 4 Date Initiated: 12/25/2020 Target Resolution Date: 01/25/2021 Goal Status: Active Ulcer/skin breakdown will have a volume reduction of 50% by week 8 Date Initiated: 12/25/2020 Target Resolution Date: 02/24/2021 Goal Status: Active Ulcer/skin breakdown will have a volume reduction of 80% by week 12 Date Initiated: 12/25/2020 Target Resolution Date: 03/27/2021 Goal Status: Active Ulcer/skin breakdown will heal within 14 weeks Date Initiated: 12/25/2020 Target Resolution Date: 04/27/2021 Goal Status: Active Interventions: Assess patient/caregiver ability to obtain necessary supplies Tompson, Mallissa E. ([id number]) Assess patient/caregiver ability to perform ulcer/skin care regimen upon admission and as needed Assess ulceration(s) every visit Provide education on ulcer and skin care Treatment Activities: Referred to DME  Mescal Flinchbaugh for dressing supplies : 12/25/2020 Skin care regimen initiated : 12/25/2020 Notes: Electronic Signature(s) Signed: 02/27/2021 12:56:33 PM By: Levora Dredge Entered By: Levora Dredge on 02/22/2021 16:01:58 Braley, [name] ([id number]) -------------------------------------------------------------------------------- Non-Wound Condition Assessment Details Patient Name: [name] Date of Service: 02/22/2021 3:00 PM Medical Record Number: [id number] Patient Account Number: [id number] Date of Birth/Sex: January 29, 1932 (87 y.o. F) Treating RN: Levora Dredge Primary Care Antavius Sperbeck: Kelton Pillar Other Clinician: Referring Farra Nikolic: Kelton Pillar Treating Anisa Leanos/Extender: Skipper Cliche in Treatment: 8 Non-Wound Condition: Condition: Suspected Deep Tissue Injury Location: Foot Side: Left left calcaneus suspected deep tissue injury. Patient and daughter educated on importance of wearing booties while Notes: in bed and placing pillow under bilat calf to off load heels. Photos Notes 12/16 covered with zetuvit with silicone boarder Electronic Signature(s) Signed: 02/27/2021 12:56:33 PM By: Levora Dredge Entered By: Levora Dredge on 02/22/2021 15:41:23 Yowell, [name] ([id number]) -------------------------------------------------------------------------------- Non-Wound Condition Assessment Details Patient Name: Roehr, [name] Date of Service: 02/22/2021 3:00 PM Medical Record Number:  [id number] Patient Account Number: [id number] Date of Birth/Sex: 1931/11/26 (87 y.o. F) Treating RN: Levora Dredge Primary Care Luverna Degenhart: Kelton Pillar Other Clinician: Referring Rogena Deupree: Kelton Pillar Treating Daisee Centner/Extender: Jeri Cos Weeks in Treatment: 8 Non-Wound Condition: Condition: Suspected Deep Tissue Injury Location: Leg Side: Left Notes: left ankle suspected deep tissue injury on left ankle measurements 0.7 L 0.8 W Photos Electronic Signature(s) Signed:  02/27/2021 12:56:33 PM By: Levora Dredge Entered By: Levora Dredge on 02/22/2021 15:41:29 Porco, [name] ([id number]) -------------------------------------------------------------------------------- Pain Assessment Details Patient Name: [name] Date of Service: 02/22/2021 3:00 PM Medical Record Number: [id number] Patient Account Number: [id number] Date of Birth/Sex: 10/12/1931 (87 y.o. F) Treating RN: Levora Dredge Primary Care Terez Montee: Kelton Pillar Other Clinician: Referring Shaquisha Wynn: Kelton Pillar Treating Olivia Royse/Extender: Skipper Cliche in Treatment: 8 Active Problems Location of Pain Severity and Description of Pain Patient Has Paino No Site Locations Rate the pain. Current Pain Level: 0 Pain Management and Medication Current Pain Management: Electronic Signature(s) Signed: 02/27/2021 12:56:33 PM By: Levora Dredge Entered By: Levora Dredge on 02/22/2021 15:17:10 Shur, [name] ([id number]) -------------------------------------------------------------------------------- Patient/Caregiver Education Details Patient Name: [name] Date of Service: 02/22/2021 3:00 PM Medical Record Number: [id number] Patient Account Number: [id number] Date of Birth/Gender: [birth year] (87 y.o. F) Treating RN: Levora Dredge Primary Care Physician: Kelton Pillar Other Clinician: Referring Physician: Kelton Pillar Treating Physician/Extender: Skipper Cliche in Treatment: 8 Education Assessment Education Provided To: Caregiver Education Topics Provided Offloading: Handouts: What is Offloadingo Methods: Explain/Verbal Responses: State content correctly Pressure: Handouts: Pressure Ulcers: Care and Offloading Methods: Explain/Verbal Responses: State content correctly Wound/Skin Impairment: Handouts: Caring for Your Ulcer Methods: Explain/Verbal Responses: State content correctly Electronic Signature(s) Signed: 02/27/2021 12:56:33 PM By: Levora Dredge Entered By: Levora Dredge on 02/22/2021 16:07:25 Berwick, [name] ([id number]) -------------------------------------------------------------------------------- Wound Assessment Details Patient Name: [name] Date of Service: 02/22/2021 3:00 PM Medical Record Number: [id number] Patient Account Number: [id number] Date of Birth/Sex: 15-Sep-1931 (87 y.o. F) Treating RN: Levora Dredge Primary Care Jeriann [name]: Kelton Pillar Other Clinician: Referring Muna Demers: Kelton Pillar Treating Chauntelle Azpeitia/Extender: Jeri Cos Weeks in Treatment: 8 Wound Status Wound Number: 1 Primary Etiology: Pressure Ulcer Wound Location: Right Calcaneus Wound Status: Open Wounding Event: Pressure Injury Comorbid Hypertension, Type II Diabetes, Osteoarthritis, History: Dementia Date Acquired: 12/25/2020 Weeks Of Treatment: 8 Clustered Wound: No Photos Wound Measurements Length: (cm) 0.4 Width: (cm) 0.6 Depth: (cm) 0.1 Area: (cm) 0.188 Volume: (cm) 0.019 % Reduction in Area: 25.1% % Reduction in Volume: 62% Epithelialization: None Tunneling: No Undermining: No Wound Description Classification: Unstageable/Unclassified Exudate Amount: Medium Exudate Type: Serosanguineous Exudate Color: red, brown Foul Odor After Cleansing: No Slough/Fibrino Yes Wound Bed Granulation Amount: Large (67-100%) Exposed Structure Granulation Quality: Pink Fascia Exposed: No Necrotic Amount: Small (1-33%) Fat Layer (Subcutaneous Tissue) Exposed: Yes Necrotic Quality: Adherent Slough Tendon Exposed: No Muscle Exposed: No Joint Exposed: No Bone Exposed: No Treatment Notes Wound #1 (Calcaneus) Wound Laterality: Right Cleanser Normal Saline Discharge Instruction: Wash your hands with soap and water. Remove old dressing, discard into plastic bag and place into trash. Cleanse the wound with Normal Saline prior to applying a clean dressing using gauze sponges, not tissues or cotton balls. Do not scrub or use  excessive force. Pat dry using gauze sponges, not tissue or cotton balls. Greenwell, Lucero E. ([id number]) Peri-Wound Care Topical Primary Dressing Prisma 4.34 (in) Discharge Instruction: Moisten w/normal saline or sterile water; Cover wound as directed. Do not remove from wound bed. Zetuvit Plus Silicone Border Dressing 5x5 (in/in) Discharge Instruction:  Secure the collagen Secondary Dressing Secured With Compression Wrap Compression Stockings Add-Ons Electronic Signature(s) Signed: 02/27/2021 12:56:33 PM By: Levora Dredge Entered By: Levora Dredge on 02/22/2021 15:36:07 Waters, [name] ([id number]) -------------------------------------------------------------------------------- Vitals Details Patient Name: [name] Date of Service: 02/22/2021 3:00 PM Medical Record Number: [id number] Patient Account Number: [id number] Date of Birth/Sex: [birth year] (87 y.o. F) Treating RN: Levora Dredge Primary Care Alara Daniel: Kelton Pillar Other Clinician: Referring Zayan Delvecchio: Kelton Pillar Treating Bertine Schlottman/Extender: Skipper Cliche in Treatment: 8 Vital Signs Time Taken: 15:15 Temperature (F): 97.7 Height (in): 63 Pulse (bpm): 91 Weight (lbs): 196 Respiratory Rate (breaths/min): 18 Body Mass Index (BMI): 34.7 Blood Pressure (mmHg): 140/84 Reference Range: 80 - 120 mg / dl Electronic Signature(s) Signed: 02/27/2021 12:56:33 PM By: Levora Dredge Entered By: Levora Dredge on 02/22/2021 15:41:41

## 2021-02-28 ENCOUNTER — Ambulatory Visit: Payer: Medicare Other | Admitting: Physician Assistant

## 2021-03-01 ENCOUNTER — Encounter: Payer: Medicare Other | Admitting: Physician Assistant

## 2021-03-08 ENCOUNTER — Telehealth (INDEPENDENT_AMBULATORY_CARE_PROVIDER_SITE_OTHER): Payer: Medicare Other | Admitting: Neurology

## 2021-03-08 ENCOUNTER — Encounter: Payer: Self-pay | Admitting: Neurology

## 2021-03-08 VITALS — Ht 63.0 in | Wt 170.0 lb

## 2021-03-08 DIAGNOSIS — F03B18 Unspecified dementia, moderate, with other behavioral disturbance: Secondary | ICD-10-CM | POA: Diagnosis not present

## 2021-03-08 MED ORDER — ESCITALOPRAM OXALATE 20 MG PO TABS: 20.0000 mg | ORAL_TABLET | Freq: Every day | ORAL | 3 refills | Status: DC

## 2021-03-08 MED ORDER — MEMANTINE HCL 10 MG PO TABS: ORAL_TABLET | ORAL | 3 refills | Status: DC

## 2021-03-08 MED ORDER — TRAZODONE HCL 50 MG PO TABS: ORAL_TABLET | ORAL | 5 refills | Status: DC

## 2021-03-08 NOTE — Patient Instructions (Signed)
Good to see you.  Try the Trazodone 50mg  1/2 tablet at night as needed to help with sleep  2. Continue Memantine 10mg  daily and Escitalopram 20mg  daily  3. Follow-up in 6 months, call for any changes   FALL PRECAUTIONS: Be cautious when walking. Scan the area for obstacles that may increase the risk of trips and falls. When getting up in the mornings, sit up at the edge of the bed for a few minutes before getting out of bed. Consider elevating the bed at the head end to avoid drop of blood pressure when getting up. Walk always in a well-lit room (use night lights in the walls). Avoid area rugs or power cords from appliances in the middle of the walkways. Use a walker or a cane if necessary and consider physical therapy for balance exercise. Get your eyesight checked regularly.   HOME SAFETY: Consider the safety of the kitchen when operating appliances like stoves, microwave oven, and blender. Consider having supervision and share cooking responsibilities until no longer able to participate in those. Accidents with firearms and other hazards in the house should be identified and addressed as well.   ABILITY TO BE LEFT ALONE: If patient is unable to contact 911 operator, consider using LifeLine, or when the need is there, arrange for someone to stay with patients. Smoking is a fire hazard, consider supervision or cessation. Risk of wandering should be assessed by caregiver and if detected at any point, supervision and safe proof recommendations should be instituted.   RECOMMENDATIONS FOR ALL PATIENTS WITH MEMORY PROBLEMS: 1. Continue to exercise (Recommend 30 minutes of walking everyday, or 3 hours every week) 2. Increase social interactions - continue going to [place] and enjoy social gatherings with friends and family 3. Eat healthy, avoid fried foods and eat more fruits and vegetables 4. Maintain adequate blood pressure, blood sugar, and blood cholesterol level. Reducing the risk of stroke and  cardiovascular disease also helps promoting better memory. 5. Avoid stressful situations. Live a simple life and avoid aggravations. Organize your time and prepare for the next day in anticipation. 6. Sleep well, avoid any interruptions of sleep and avoid any distractions in the bedroom that may interfere with adequate sleep quality 7. Avoid sugar, avoid sweets as there is a strong link between excessive sugar intake, diabetes, and cognitive impairment We discussed the Mediterranean diet, which has been shown to help patients reduce the risk of progressive memory disorders and reduces cardiovascular risk. This includes eating fish, eat fruits and green leafy vegetables, nuts like almonds and hazelnuts, walnuts, and also use olive oil. Avoid fast foods and fried foods as much as possible. Avoid sweets and sugar as sugar use has been linked to worsening of memory function.  There is always a concern of gradual progression of memory problems. If this is the case, then we may need to adjust level of care according to patient needs. Support, both to the patient and caregiver, should then be put into place.

## 2021-03-08 NOTE — Progress Notes (Signed)
Virtual Visit via Video Note The purpose of this virtual visit is to provide medical care while limiting exposure to the novel coronavirus.    Consent was obtained for video visit:  Yes.   Answered questions that patient had about telehealth interaction:  Yes.   I discussed the limitations, risks, security and privacy concerns of performing an evaluation and management service by telemedicine. I also discussed with the patient that there may be a patient responsible charge related to this service. The patient expressed understanding and agreed to proceed.  Pt location: Home Physician Location: office Name of referring provider:  Kelton Pillar, MD I connected with [name] at patients initiation/request on 03/08/2021 at  2:00 PM EST by video enabled telemedicine application and verified that I am speaking with the correct person using two identifiers. Pt MRN:  [id number] Pt DOB:  [birth year] Video Participants:  [name];  [name] (daughter)   History of Present Illness:  The patient had a virtual video visit on 03/08/2021. She last last seen in the neurology clinic 8 months ago for moderate dementia with behavioral disturbance. Her daughter [name] is present to provide information. Since her last visit, [name] had called our office in July to report a large decline. She uses the wrong words for things, calls people the wrong names, more irritated with things, forgets to change her diaper. It appears she was treated for a UTI after. Since then, [name] reports she has been doing pretty well. [name] manages medications, finances, meals. [name] had a mild stroke in November and states that since then, the patient has stopped walking. She has been wheelchair-bound, one caregiver can get her up to exercise. PCP has been contacted for physical therapy. Behaviors appear better, she still gets angry sometimes but is agreeable most of the time. Her grandson reports she hallucinates at  night,reaching for things in the middle of the night. Her grandchildren have noticed she stays awake at night, she wakes up all tired, getting days and nights mixed up. [name] does not think this occurs every night. She denies any headaches, dizziness, focal numbness/tingling. [name] reports an incident on Christmas day where there was bleeding from her right ear, swelling on the right side of her face and right arm. Once she got up, it eventually resolved without needing medical evaluation. She is on Memantine 10mg  daily (side effects on Memantine BID and Donepezil), and Lexapro 20mg  daily without side effects.   History on Initial Assessment 02/24/2019: This is an 87 year old ambidextrous right-hand dominant woman with a history of hypertension, hyperlipidemia, diabetes, breast cancer, presenting for evaluation of memory loss. She states her memory is "pretty well."  She states she usually remembers to take her medications. Her daughter [name] is present during the e-visit and provides a different story. She has been living with her family since 80. Family started noticing memory changes at least 5 years ago, worse in the past year. She would repeat herself, lose things frequently. In the Fall of last year, she would not come home until very late at night, they were now sure if she knew where she was. She started having difficulties managing finances. She had always been early paying her bills, however they found out her credit score went from 800 to 644 because she was forgetting to pay. [name] found out in March that bills were 2-3 months behind. For the past 2 years, she has had paranoia, accusing everybody of being a thief. She is  convinced she saw her grandson reach over her one night to take money, which never happened. She states this is true and she asked him what he was there for at 4am last week. She states her grandson is cursing every time he comes in. She has not showered in 2.5 years and has not  washed her hair this year. She only takes sponge baths. Her shower stall is full of things, their house looks like a hoarder house. They found out that her license expired in January 2020 but she continued to drive, so they took away her keys. She has not driven since the end of February. She denies getting lost, however [name] reports she called at least once last Fall asking how to get home from their local [place]. She denies burning food on the stove, [name] reminds her she has not cooked in a few weeks because she had burned something. She is irritated every single day. She states she feels fine until people ask me thing that are "not that." She was started on Donepezil 10mg  daily by her PCP. Several paternal aunts had Alzheimer's disease. Another relative (maternal grandmother's first cousin) had early onset dementia. No history of significant head injuries or alcohol use.   She has left shoulder pain. She has occasional numbness in her feet that resolves when standing. She fell a month ago when she tripped and was on the floor all night. It appears she had incontinence while on the floor, she does not remember this. She denies any headaches, dizziness, diplopia, dysarthria/dysphagia, neck/back pain, focal numbness/tingling/weakness, bowel/bladder dysfunction, anosmia, or tremors.    Current Outpatient Medications on File Prior to Visit  Medication Sig Dispense Refill   Calcium Carbonate-Vitamin D (CALCIUM-D PO) Take 1 tablet by mouth daily.     escitalopram (LEXAPRO) 20 MG tablet Take 1 tablet (20 mg total) by mouth at bedtime. 90 tablet 3   Ferrous Sulfate (IRON PO) Take 65 mg by mouth.     memantine (NAMENDA) 10 MG tablet Take 1 tablet daily 90 tablet 3   metFORMIN (GLUCOPHAGE-XR) 500 MG 24 hr tablet Take 500 mg by mouth daily.     Multiple Vitamins-Minerals (MULTIVITAMIN WITH MINERALS) tablet Take 1 tablet by mouth daily.     simvastatin (ZOCOR) 40 MG tablet Take 40 mg by mouth daily.      TRAVATAN Z 0.004 % SOLN ophthalmic solution Place 1 drop into both eyes at bedtime.     No current facility-administered medications on file prior to visit.     Observations/Objective:   Vitals:   03/08/21 1038  Weight: 170 lb (77.1 kg)  Height: 5\' 3"  (1.6 m)   GEN:  The patient appears stated age and is in NAD. She is sitting on a wheelchair, awake, alert, answers in full sentences. Remote and recent memory impaired. Attention and concentration reduced. Cranial nerves: Extraocular movements intact. No facial asymmetry. Motor: moves both UE at least anti-gravity.    Assessment and Plan:   This is an 87 yo ambidextrous right-hand dominant woman with a history of hypertension, hyperlipidemia, diabetes, breast cancer, with moderate dementia likely Alzheimer's disease with behavioral disturbance. MRI brain showed diffuse atrophy, mild to moderate chronic microvascular disease. Family is reporting occasional sleep difficulties, we agreed to have prn Trazodone 50mg  1/2 tab qhs when she has bad nights. Continue Memantine 10mg  daily and Lexapro 20mg  daily. Continue 24/7 care. Follow-up in 6 months with [facility] PA Sharene Butters. They know to call for any changes.  Follow Up Instructions:   -I discussed the assessment and treatment plan with the patient. The patient was provided an opportunity to ask questions and all were answered. The patient agreed with the plan and demonstrated an understanding of the instructions.   The patient was advised to call back or seek an in-person evaluation if the symptoms worsen or if the condition fails to improve as anticipated.    Cameron Sprang, MD

## 2021-03-10 DIAGNOSIS — R262 Difficulty in walking, not elsewhere classified: Secondary | ICD-10-CM | POA: Diagnosis not present

## 2021-03-10 DIAGNOSIS — M1990 Unspecified osteoarthritis, unspecified site: Secondary | ICD-10-CM | POA: Diagnosis not present

## 2021-03-12 DIAGNOSIS — M1990 Unspecified osteoarthritis, unspecified site: Secondary | ICD-10-CM | POA: Diagnosis not present

## 2021-03-15 ENCOUNTER — Ambulatory Visit: Payer: Medicare Other | Admitting: Physician Assistant

## 2021-03-18 ENCOUNTER — Ambulatory Visit: Payer: Medicare Other | Admitting: Podiatry

## 2021-03-22 ENCOUNTER — Ambulatory Visit: Payer: Medicare Other | Admitting: Physician Assistant

## 2021-03-25 ENCOUNTER — Encounter: Payer: Self-pay | Admitting: Podiatry

## 2021-03-27 ENCOUNTER — Ambulatory Visit: Payer: Medicare Other | Admitting: Podiatry

## 2021-03-27 DIAGNOSIS — N182 Chronic kidney disease, stage 2 (mild): Secondary | ICD-10-CM | POA: Diagnosis not present

## 2021-03-27 DIAGNOSIS — E7800 Pure hypercholesterolemia, unspecified: Secondary | ICD-10-CM | POA: Diagnosis not present

## 2021-03-27 DIAGNOSIS — Z7409 Other reduced mobility: Secondary | ICD-10-CM | POA: Diagnosis not present

## 2021-03-27 DIAGNOSIS — E1122 Type 2 diabetes mellitus with diabetic chronic kidney disease: Secondary | ICD-10-CM | POA: Diagnosis not present

## 2021-03-29 DIAGNOSIS — N189 Chronic kidney disease, unspecified: Secondary | ICD-10-CM | POA: Diagnosis not present

## 2021-03-29 DIAGNOSIS — K5790 Diverticulosis of intestine, part unspecified, without perforation or abscess without bleeding: Secondary | ICD-10-CM | POA: Diagnosis not present

## 2021-03-29 DIAGNOSIS — Z7984 Long term (current) use of oral hypoglycemic drugs: Secondary | ICD-10-CM | POA: Diagnosis not present

## 2021-03-29 DIAGNOSIS — L89612 Pressure ulcer of right heel, stage 2: Secondary | ICD-10-CM | POA: Diagnosis not present

## 2021-03-29 DIAGNOSIS — E1122 Type 2 diabetes mellitus with diabetic chronic kidney disease: Secondary | ICD-10-CM | POA: Diagnosis not present

## 2021-03-29 DIAGNOSIS — H409 Unspecified glaucoma: Secondary | ICD-10-CM | POA: Diagnosis not present

## 2021-03-29 DIAGNOSIS — N6019 Diffuse cystic mastopathy of unspecified breast: Secondary | ICD-10-CM | POA: Diagnosis not present

## 2021-03-29 DIAGNOSIS — Z9181 History of falling: Secondary | ICD-10-CM | POA: Diagnosis not present

## 2021-03-29 DIAGNOSIS — E7800 Pure hypercholesterolemia, unspecified: Secondary | ICD-10-CM | POA: Diagnosis not present

## 2021-03-29 DIAGNOSIS — L89620 Pressure ulcer of left heel, unstageable: Secondary | ICD-10-CM | POA: Diagnosis not present

## 2021-03-29 DIAGNOSIS — Z87891 Personal history of nicotine dependence: Secondary | ICD-10-CM | POA: Diagnosis not present

## 2021-03-29 DIAGNOSIS — E049 Nontoxic goiter, unspecified: Secondary | ICD-10-CM | POA: Diagnosis not present

## 2021-03-29 DIAGNOSIS — I10 Essential (primary) hypertension: Secondary | ICD-10-CM | POA: Diagnosis not present

## 2021-03-29 DIAGNOSIS — K2270 Barrett's esophagus without dysplasia: Secondary | ICD-10-CM | POA: Diagnosis not present

## 2021-03-29 DIAGNOSIS — K449 Diaphragmatic hernia without obstruction or gangrene: Secondary | ICD-10-CM | POA: Diagnosis not present

## 2021-03-29 DIAGNOSIS — Z993 Dependence on wheelchair: Secondary | ICD-10-CM | POA: Diagnosis not present

## 2021-03-29 DIAGNOSIS — D126 Benign neoplasm of colon, unspecified: Secondary | ICD-10-CM | POA: Diagnosis not present

## 2021-03-29 DIAGNOSIS — Z4800 Encounter for change or removal of nonsurgical wound dressing: Secondary | ICD-10-CM | POA: Diagnosis not present

## 2021-04-04 ENCOUNTER — Ambulatory Visit: Payer: Medicare Other | Admitting: Podiatry

## 2021-04-05 DIAGNOSIS — I10 Essential (primary) hypertension: Secondary | ICD-10-CM | POA: Diagnosis not present

## 2021-04-05 DIAGNOSIS — H409 Unspecified glaucoma: Secondary | ICD-10-CM | POA: Diagnosis not present

## 2021-04-05 DIAGNOSIS — K2270 Barrett's esophagus without dysplasia: Secondary | ICD-10-CM | POA: Diagnosis not present

## 2021-04-05 DIAGNOSIS — Z4800 Encounter for change or removal of nonsurgical wound dressing: Secondary | ICD-10-CM | POA: Diagnosis not present

## 2021-04-05 DIAGNOSIS — L89612 Pressure ulcer of right heel, stage 2: Secondary | ICD-10-CM | POA: Diagnosis not present

## 2021-04-05 DIAGNOSIS — K5790 Diverticulosis of intestine, part unspecified, without perforation or abscess without bleeding: Secondary | ICD-10-CM | POA: Diagnosis not present

## 2021-04-05 DIAGNOSIS — N6019 Diffuse cystic mastopathy of unspecified breast: Secondary | ICD-10-CM | POA: Diagnosis not present

## 2021-04-05 DIAGNOSIS — E049 Nontoxic goiter, unspecified: Secondary | ICD-10-CM | POA: Diagnosis not present

## 2021-04-05 DIAGNOSIS — E7800 Pure hypercholesterolemia, unspecified: Secondary | ICD-10-CM | POA: Diagnosis not present

## 2021-04-05 DIAGNOSIS — Z87891 Personal history of nicotine dependence: Secondary | ICD-10-CM | POA: Diagnosis not present

## 2021-04-05 DIAGNOSIS — D126 Benign neoplasm of colon, unspecified: Secondary | ICD-10-CM | POA: Diagnosis not present

## 2021-04-05 DIAGNOSIS — N189 Chronic kidney disease, unspecified: Secondary | ICD-10-CM | POA: Diagnosis not present

## 2021-04-05 DIAGNOSIS — L89620 Pressure ulcer of left heel, unstageable: Secondary | ICD-10-CM | POA: Diagnosis not present

## 2021-04-05 DIAGNOSIS — E1122 Type 2 diabetes mellitus with diabetic chronic kidney disease: Secondary | ICD-10-CM | POA: Diagnosis not present

## 2021-04-05 DIAGNOSIS — Z7984 Long term (current) use of oral hypoglycemic drugs: Secondary | ICD-10-CM | POA: Diagnosis not present

## 2021-04-05 DIAGNOSIS — K449 Diaphragmatic hernia without obstruction or gangrene: Secondary | ICD-10-CM | POA: Diagnosis not present

## 2021-04-05 DIAGNOSIS — Z993 Dependence on wheelchair: Secondary | ICD-10-CM | POA: Diagnosis not present

## 2021-04-05 DIAGNOSIS — Z9181 History of falling: Secondary | ICD-10-CM | POA: Diagnosis not present

## 2021-04-06 DIAGNOSIS — K5790 Diverticulosis of intestine, part unspecified, without perforation or abscess without bleeding: Secondary | ICD-10-CM | POA: Diagnosis not present

## 2021-04-06 DIAGNOSIS — E7800 Pure hypercholesterolemia, unspecified: Secondary | ICD-10-CM | POA: Diagnosis not present

## 2021-04-06 DIAGNOSIS — L89620 Pressure ulcer of left heel, unstageable: Secondary | ICD-10-CM | POA: Diagnosis not present

## 2021-04-06 DIAGNOSIS — Z7984 Long term (current) use of oral hypoglycemic drugs: Secondary | ICD-10-CM | POA: Diagnosis not present

## 2021-04-06 DIAGNOSIS — Z4800 Encounter for change or removal of nonsurgical wound dressing: Secondary | ICD-10-CM | POA: Diagnosis not present

## 2021-04-06 DIAGNOSIS — I10 Essential (primary) hypertension: Secondary | ICD-10-CM | POA: Diagnosis not present

## 2021-04-06 DIAGNOSIS — H409 Unspecified glaucoma: Secondary | ICD-10-CM | POA: Diagnosis not present

## 2021-04-06 DIAGNOSIS — E1122 Type 2 diabetes mellitus with diabetic chronic kidney disease: Secondary | ICD-10-CM | POA: Diagnosis not present

## 2021-04-06 DIAGNOSIS — N189 Chronic kidney disease, unspecified: Secondary | ICD-10-CM | POA: Diagnosis not present

## 2021-04-06 DIAGNOSIS — K449 Diaphragmatic hernia without obstruction or gangrene: Secondary | ICD-10-CM | POA: Diagnosis not present

## 2021-04-06 DIAGNOSIS — D126 Benign neoplasm of colon, unspecified: Secondary | ICD-10-CM | POA: Diagnosis not present

## 2021-04-06 DIAGNOSIS — L89612 Pressure ulcer of right heel, stage 2: Secondary | ICD-10-CM | POA: Diagnosis not present

## 2021-04-06 DIAGNOSIS — E049 Nontoxic goiter, unspecified: Secondary | ICD-10-CM | POA: Diagnosis not present

## 2021-04-06 DIAGNOSIS — N6019 Diffuse cystic mastopathy of unspecified breast: Secondary | ICD-10-CM | POA: Diagnosis not present

## 2021-04-06 DIAGNOSIS — K2270 Barrett's esophagus without dysplasia: Secondary | ICD-10-CM | POA: Diagnosis not present

## 2021-04-06 DIAGNOSIS — Z87891 Personal history of nicotine dependence: Secondary | ICD-10-CM | POA: Diagnosis not present

## 2021-04-06 DIAGNOSIS — Z993 Dependence on wheelchair: Secondary | ICD-10-CM | POA: Diagnosis not present

## 2021-04-06 DIAGNOSIS — Z9181 History of falling: Secondary | ICD-10-CM | POA: Diagnosis not present

## 2021-04-10 ENCOUNTER — Other Ambulatory Visit: Payer: Self-pay

## 2021-04-10 ENCOUNTER — Ambulatory Visit (HOSPITAL_COMMUNITY)
Admission: RE | Admit: 2021-04-10 | Discharge: 2021-04-10 | Disposition: A | Discharge status: Home / Self Care | Payer: Medicare Other | Source: Ambulatory Visit | Attending: Cardiology | Admitting: Cardiology

## 2021-04-10 DIAGNOSIS — E119 Type 2 diabetes mellitus without complications: Secondary | ICD-10-CM | POA: Diagnosis not present

## 2021-04-10 DIAGNOSIS — R0602 Shortness of breath: Secondary | ICD-10-CM | POA: Insufficient documentation

## 2021-04-10 DIAGNOSIS — M1990 Unspecified osteoarthritis, unspecified site: Secondary | ICD-10-CM | POA: Diagnosis not present

## 2021-04-10 DIAGNOSIS — E785 Hyperlipidemia, unspecified: Secondary | ICD-10-CM | POA: Insufficient documentation

## 2021-04-10 DIAGNOSIS — I10 Essential (primary) hypertension: Secondary | ICD-10-CM | POA: Diagnosis not present

## 2021-04-10 DIAGNOSIS — I350 Nonrheumatic aortic (valve) stenosis: Secondary | ICD-10-CM | POA: Insufficient documentation

## 2021-04-10 DIAGNOSIS — R600 Localized edema: Secondary | ICD-10-CM | POA: Diagnosis not present

## 2021-04-10 DIAGNOSIS — R262 Difficulty in walking, not elsewhere classified: Secondary | ICD-10-CM | POA: Diagnosis not present

## 2021-04-10 LAB — ECHOCARDIOGRAM COMPLETE
AR max vel: 1.25 cm2
AV Area VTI: 1.28 cm2
AV Area mean vel: 1.26 cm2
AV Mean grad: 15.4 mmHg
AV Peak grad: 26.1 mmHg
Ao pk vel: 2.55 m/s
Area-P 1/2: 1.94 cm2
MV VTI: 1.84 cm2

## 2021-04-11 ENCOUNTER — Ambulatory Visit: Payer: Medicare Other

## 2021-04-11 DIAGNOSIS — Z4800 Encounter for change or removal of nonsurgical wound dressing: Secondary | ICD-10-CM | POA: Diagnosis not present

## 2021-04-11 DIAGNOSIS — E7800 Pure hypercholesterolemia, unspecified: Secondary | ICD-10-CM | POA: Diagnosis not present

## 2021-04-11 DIAGNOSIS — N189 Chronic kidney disease, unspecified: Secondary | ICD-10-CM | POA: Diagnosis not present

## 2021-04-11 DIAGNOSIS — N6019 Diffuse cystic mastopathy of unspecified breast: Secondary | ICD-10-CM | POA: Diagnosis not present

## 2021-04-11 DIAGNOSIS — Z993 Dependence on wheelchair: Secondary | ICD-10-CM | POA: Diagnosis not present

## 2021-04-11 DIAGNOSIS — L89612 Pressure ulcer of right heel, stage 2: Secondary | ICD-10-CM | POA: Diagnosis not present

## 2021-04-11 DIAGNOSIS — K2270 Barrett's esophagus without dysplasia: Secondary | ICD-10-CM | POA: Diagnosis not present

## 2021-04-11 DIAGNOSIS — E049 Nontoxic goiter, unspecified: Secondary | ICD-10-CM | POA: Diagnosis not present

## 2021-04-11 DIAGNOSIS — E1122 Type 2 diabetes mellitus with diabetic chronic kidney disease: Secondary | ICD-10-CM | POA: Diagnosis not present

## 2021-04-11 DIAGNOSIS — Z9181 History of falling: Secondary | ICD-10-CM | POA: Diagnosis not present

## 2021-04-11 DIAGNOSIS — D126 Benign neoplasm of colon, unspecified: Secondary | ICD-10-CM | POA: Diagnosis not present

## 2021-04-11 DIAGNOSIS — K449 Diaphragmatic hernia without obstruction or gangrene: Secondary | ICD-10-CM | POA: Diagnosis not present

## 2021-04-11 DIAGNOSIS — Z87891 Personal history of nicotine dependence: Secondary | ICD-10-CM | POA: Diagnosis not present

## 2021-04-11 DIAGNOSIS — L89620 Pressure ulcer of left heel, unstageable: Secondary | ICD-10-CM | POA: Diagnosis not present

## 2021-04-11 DIAGNOSIS — Z7984 Long term (current) use of oral hypoglycemic drugs: Secondary | ICD-10-CM | POA: Diagnosis not present

## 2021-04-11 DIAGNOSIS — K5790 Diverticulosis of intestine, part unspecified, without perforation or abscess without bleeding: Secondary | ICD-10-CM | POA: Diagnosis not present

## 2021-04-11 DIAGNOSIS — H409 Unspecified glaucoma: Secondary | ICD-10-CM | POA: Diagnosis not present

## 2021-04-11 DIAGNOSIS — I10 Essential (primary) hypertension: Secondary | ICD-10-CM | POA: Diagnosis not present

## 2021-04-12 ENCOUNTER — Telehealth (HOSPITAL_COMMUNITY): Payer: Self-pay | Admitting: Licensed Clinical Social Worker

## 2021-04-12 DIAGNOSIS — E7800 Pure hypercholesterolemia, unspecified: Secondary | ICD-10-CM | POA: Diagnosis not present

## 2021-04-12 DIAGNOSIS — N189 Chronic kidney disease, unspecified: Secondary | ICD-10-CM | POA: Diagnosis not present

## 2021-04-12 DIAGNOSIS — Z4800 Encounter for change or removal of nonsurgical wound dressing: Secondary | ICD-10-CM | POA: Diagnosis not present

## 2021-04-12 DIAGNOSIS — K2270 Barrett's esophagus without dysplasia: Secondary | ICD-10-CM | POA: Diagnosis not present

## 2021-04-12 DIAGNOSIS — K449 Diaphragmatic hernia without obstruction or gangrene: Secondary | ICD-10-CM | POA: Diagnosis not present

## 2021-04-12 DIAGNOSIS — E049 Nontoxic goiter, unspecified: Secondary | ICD-10-CM | POA: Diagnosis not present

## 2021-04-12 DIAGNOSIS — D126 Benign neoplasm of colon, unspecified: Secondary | ICD-10-CM | POA: Diagnosis not present

## 2021-04-12 DIAGNOSIS — Z7984 Long term (current) use of oral hypoglycemic drugs: Secondary | ICD-10-CM | POA: Diagnosis not present

## 2021-04-12 DIAGNOSIS — Z87891 Personal history of nicotine dependence: Secondary | ICD-10-CM | POA: Diagnosis not present

## 2021-04-12 DIAGNOSIS — Z993 Dependence on wheelchair: Secondary | ICD-10-CM | POA: Diagnosis not present

## 2021-04-12 DIAGNOSIS — M1990 Unspecified osteoarthritis, unspecified site: Secondary | ICD-10-CM | POA: Diagnosis not present

## 2021-04-12 DIAGNOSIS — K5790 Diverticulosis of intestine, part unspecified, without perforation or abscess without bleeding: Secondary | ICD-10-CM | POA: Diagnosis not present

## 2021-04-12 DIAGNOSIS — Z9181 History of falling: Secondary | ICD-10-CM | POA: Diagnosis not present

## 2021-04-12 DIAGNOSIS — L89612 Pressure ulcer of right heel, stage 2: Secondary | ICD-10-CM | POA: Diagnosis not present

## 2021-04-12 DIAGNOSIS — E1122 Type 2 diabetes mellitus with diabetic chronic kidney disease: Secondary | ICD-10-CM | POA: Diagnosis not present

## 2021-04-12 DIAGNOSIS — N6019 Diffuse cystic mastopathy of unspecified breast: Secondary | ICD-10-CM | POA: Diagnosis not present

## 2021-04-12 DIAGNOSIS — I10 Essential (primary) hypertension: Secondary | ICD-10-CM | POA: Diagnosis not present

## 2021-04-12 DIAGNOSIS — H409 Unspecified glaucoma: Secondary | ICD-10-CM | POA: Diagnosis not present

## 2021-04-12 DIAGNOSIS — L89620 Pressure ulcer of left heel, unstageable: Secondary | ICD-10-CM | POA: Diagnosis not present

## 2021-04-12 NOTE — Progress Notes (Signed)
Heart and Vascular Care Navigation  04/12/2021  BARBIE CROSTON 1931-09-09 [id number]  Reason for Referral: consulted due to concerns with pt having access to safe transportation to medical appts.   Engaged with patient by telephone for initial visit for Heart and Vascular Care Coordination.                                                                                                   Assessment:  CSW spoke with pt dtr to discuss above concerns.  Dtr states that ever since January they have had trouble with getting reliable transport through pt [employer].  Reports that their plan started arranging transport through their own company which works out of [place] and multiple rides didn't show to pick them up or when they did show they were late or were a sedan despite the patient needing wheelchair transportation.  This lead to them having to transport the pt in their car to get to appt yesterday which felt very unsafe to them as the pt requires total assist and they were at great risk for a fall while doing this.  CSW explained [facility] [employer] as immediate solution to the pts upcoming appts this month- CSW able to arrange rides through [employer] they will reach out to pt dtr the day before the rides to confirm details.  CSW then discussed long term solution of applying for [employer] [place] transport.  Emailed and mailed application to them so they could work on enrollment.                                 HRT/VAS Care Coordination     Living arrangements for the past 2 months Single Family Home   Lives with: Adult Children   Patient Current Insurance Coverage Managed Medicare   Patient Has Concern With Paying Medical Bills No   Does Patient Have Prescription Coverage? Yes   [facility] bed; [facility]; Wheelchair   DME Agency [employer]   [place]       Social History:                                                                              SDOH Screenings   Alcohol Screen: Not on file  Depression (PHQ2-9): Not on file  Financial Resource Strain: Not on file  Food Insecurity: Not on file  Housing: Not on file  Physical Activity: Not on file  Social Connections: Not on file  Stress: Not on file  Tobacco Use: Medium Risk   Smoking Tobacco Use: Former   Smokeless Tobacco Use: Never   Passive Exposure: Not on file  Transportation Needs: Unmet Transportation Needs   Lack of Transportation (Medical): Yes  Lack of Transportation (Non-Medical): No    SDOH Interventions: Food Insecurity:   None reported  Housing Insecurity:  None reported  Transportation:   Transportation Interventions: Financial planner, [employer] (Paediatric nurse)    Follow-up plan:    Pt dtr to apply for [employer] for long term local solution to transportation issues.  Provided pt dtr with my number in case she has further needs  Jorge Ny, [facility] Desk#: [phone] Cell#: [phone]

## 2021-04-16 DIAGNOSIS — Z9181 History of falling: Secondary | ICD-10-CM | POA: Diagnosis not present

## 2021-04-16 DIAGNOSIS — N6019 Diffuse cystic mastopathy of unspecified breast: Secondary | ICD-10-CM | POA: Diagnosis not present

## 2021-04-16 DIAGNOSIS — E049 Nontoxic goiter, unspecified: Secondary | ICD-10-CM | POA: Diagnosis not present

## 2021-04-16 DIAGNOSIS — K5790 Diverticulosis of intestine, part unspecified, without perforation or abscess without bleeding: Secondary | ICD-10-CM | POA: Diagnosis not present

## 2021-04-16 DIAGNOSIS — D126 Benign neoplasm of colon, unspecified: Secondary | ICD-10-CM | POA: Diagnosis not present

## 2021-04-16 DIAGNOSIS — K449 Diaphragmatic hernia without obstruction or gangrene: Secondary | ICD-10-CM | POA: Diagnosis not present

## 2021-04-16 DIAGNOSIS — Z993 Dependence on wheelchair: Secondary | ICD-10-CM | POA: Diagnosis not present

## 2021-04-16 DIAGNOSIS — L89612 Pressure ulcer of right heel, stage 2: Secondary | ICD-10-CM | POA: Diagnosis not present

## 2021-04-16 DIAGNOSIS — Z87891 Personal history of nicotine dependence: Secondary | ICD-10-CM | POA: Diagnosis not present

## 2021-04-16 DIAGNOSIS — E7800 Pure hypercholesterolemia, unspecified: Secondary | ICD-10-CM | POA: Diagnosis not present

## 2021-04-16 DIAGNOSIS — K2270 Barrett's esophagus without dysplasia: Secondary | ICD-10-CM | POA: Diagnosis not present

## 2021-04-16 DIAGNOSIS — I10 Essential (primary) hypertension: Secondary | ICD-10-CM | POA: Diagnosis not present

## 2021-04-16 DIAGNOSIS — H409 Unspecified glaucoma: Secondary | ICD-10-CM | POA: Diagnosis not present

## 2021-04-16 DIAGNOSIS — L89620 Pressure ulcer of left heel, unstageable: Secondary | ICD-10-CM | POA: Diagnosis not present

## 2021-04-16 DIAGNOSIS — Z4800 Encounter for change or removal of nonsurgical wound dressing: Secondary | ICD-10-CM | POA: Diagnosis not present

## 2021-04-16 DIAGNOSIS — N189 Chronic kidney disease, unspecified: Secondary | ICD-10-CM | POA: Diagnosis not present

## 2021-04-16 DIAGNOSIS — E1122 Type 2 diabetes mellitus with diabetic chronic kidney disease: Secondary | ICD-10-CM | POA: Diagnosis not present

## 2021-04-16 DIAGNOSIS — Z7984 Long term (current) use of oral hypoglycemic drugs: Secondary | ICD-10-CM | POA: Diagnosis not present

## 2021-04-18 ENCOUNTER — Other Ambulatory Visit: Payer: Self-pay

## 2021-04-18 ENCOUNTER — Encounter: Payer: Medicare Other | Attending: Physician Assistant | Admitting: Physician Assistant

## 2021-04-18 ENCOUNTER — Other Ambulatory Visit
Admission: RE | Admit: 2021-04-18 | Discharge: 2021-04-18 | Disposition: A | Discharge status: Home / Self Care | Payer: Medicare Other | Source: Ambulatory Visit | Attending: Physician Assistant | Admitting: Physician Assistant

## 2021-04-18 DIAGNOSIS — M1990 Unspecified osteoarthritis, unspecified site: Secondary | ICD-10-CM | POA: Diagnosis not present

## 2021-04-18 DIAGNOSIS — S30810A Abrasion of lower back and pelvis, initial encounter: Secondary | ICD-10-CM | POA: Diagnosis not present

## 2021-04-18 DIAGNOSIS — E1151 Type 2 diabetes mellitus with diabetic peripheral angiopathy without gangrene: Secondary | ICD-10-CM | POA: Insufficient documentation

## 2021-04-18 DIAGNOSIS — L89613 Pressure ulcer of right heel, stage 3: Secondary | ICD-10-CM | POA: Diagnosis not present

## 2021-04-18 DIAGNOSIS — Z880 Allergy status to penicillin: Secondary | ICD-10-CM | POA: Diagnosis not present

## 2021-04-18 DIAGNOSIS — I10 Essential (primary) hypertension: Secondary | ICD-10-CM | POA: Diagnosis not present

## 2021-04-18 DIAGNOSIS — F0390 Unspecified dementia without behavioral disturbance: Secondary | ICD-10-CM | POA: Insufficient documentation

## 2021-04-18 DIAGNOSIS — L89322 Pressure ulcer of left buttock, stage 2: Secondary | ICD-10-CM | POA: Insufficient documentation

## 2021-04-18 DIAGNOSIS — E11622 Type 2 diabetes mellitus with other skin ulcer: Secondary | ICD-10-CM | POA: Diagnosis not present

## 2021-04-18 DIAGNOSIS — L89312 Pressure ulcer of right buttock, stage 2: Secondary | ICD-10-CM | POA: Diagnosis not present

## 2021-04-18 DIAGNOSIS — L97322 Non-pressure chronic ulcer of left ankle with fat layer exposed: Secondary | ICD-10-CM | POA: Diagnosis not present

## 2021-04-18 DIAGNOSIS — L89623 Pressure ulcer of left heel, stage 3: Secondary | ICD-10-CM | POA: Insufficient documentation

## 2021-04-18 DIAGNOSIS — L089 Local infection of the skin and subcutaneous tissue, unspecified: Secondary | ICD-10-CM | POA: Diagnosis not present

## 2021-04-18 DIAGNOSIS — L97329 Non-pressure chronic ulcer of left ankle with unspecified severity: Secondary | ICD-10-CM | POA: Diagnosis not present

## 2021-04-18 DIAGNOSIS — L97422 Non-pressure chronic ulcer of left heel and midfoot with fat layer exposed: Secondary | ICD-10-CM | POA: Diagnosis not present

## 2021-04-18 DIAGNOSIS — L89893 Pressure ulcer of other site, stage 3: Secondary | ICD-10-CM | POA: Insufficient documentation

## 2021-04-18 NOTE — Progress Notes (Addendum)
Kimberly, ELAJAH [name] ([id number]) Visit Report for 04/18/2021 Chief Complaint Document Details Patient Name: [name] Date of Service: 04/18/2021 2:45 PM Medical Record Number: [id number] Patient Account Number: [id number] Date of Birth/Sex: 20-Feb-1932 (88 y.o. F) Treating RN: Cornell Barman Primary Care Provider: Kelton Pillar Other Clinician: Referring Provider: Kelton Pillar Treating Provider/Extender: Skipper Cliche in Treatment: 16 Information Obtained from: Patient Chief Complaint Right heel pressure, left heel pressure ulcer, and left ankle pressure ulcer Electronic Signature(s) Signed: 04/18/2021 3:34:27 PM By: Worthy Keeler PA-C Previous Signature: 04/18/2021 3:16:29 PM Version By: Worthy Keeler PA-C Entered By: Worthy Keeler on 04/18/2021 15:34:27 Falcon, [name] ([id number]) -------------------------------------------------------------------------------- HPI Details Patient Name: [name] Date of Service: 04/18/2021 2:45 PM Medical Record Number: [id number] Patient Account Number: [id number] Date of Birth/Sex: 29-May-1931 (88 y.o. F) Treating RN: Cornell Barman Primary Care Provider: Kelton Pillar Other Clinician: Referring Provider: Kelton Pillar Treating Provider/Extender: Skipper Cliche in Treatment: 16 History of Present Illness HPI Description: 12/25/20 upon evaluation today patient presents for a wound on her heel which has been present for about 1 and half months or so according to her daughter. The patient does have dementia. With that being said her daughter is the primary caregiver and seems to be doing an excellent job. She had a fairly significant wound on the heel with eschar covering however this was lifting up and it appears to be mostly healed underneath the biggest issue is on the posterior aspect of the heel where she has a small slit of an opening in the Achilles region which is actually what is mainly painful for her today. We do not have a hemoglobin A1c  as the patient actually is seen by a provider outside of the [facility] system so I am not able to look this up. With that being said I do not see any evidence of active infection and I do think that she is actually doing quite well I think her daughter is doing a great job taking care of the wound in particular. With regard to her past medical history she does have dementia and diabetes mellitus type 2 but otherwise no major medical problems. She did have an x-ray performed 11/27/2020 which was negative for any signs of osteomyelitis. 01/03/2021 upon evaluation today patient appears to be doing well with regard to her heel ulcer. She has been tolerating the dressing changes without complication the collagen seems to be doing a good job for her. I do not see any signs of infection at this point. Unfortunately the patient is stated to have a wound on her bottom although her daughter is not sure how bad this really is next week we will get a get her into a room with a bed so that we can actually check this out I do not want things to get worse unnecessarily. 01/10/2021 upon evaluation today patient's wound actually appears to be doing well in regard to her heel there is some slough noted I Georgina Peer clear this away with some sharp debridement today. With regard to the gluteal region there is some abrasion noted here. This seems to be very superficial and I think it is from her scratching mainly they have been using Desitin I think she could benefit from possibly utilizing some AandE ointment which I think could be of benefit for her. Fortunately there does not appear to be any signs of active infection systemically which is great news. 01/17/2021 upon evaluation today patient's wound on the gluteal  region actually appears to be healed and everything is doing quite well. I am very pleased in that regard. With regard to the wound on her heel that is measuring smaller and looking much better and very pleased with what  I see as well today. 01/25/2021 upon evaluation today patient appears to be doing well in regard to her wounds. She has been actually healing quite nicely in regard to the heel and the gluteal region also seems to be doing quite well. I do not see any evidence of infection which is great. She had several areas of scratching that she had been messing with but again nothing that appears to be open at the this point. 02/15/2021 upon evaluation patient's wounds actually appear to be doing decently well. At this point I am still concerned about the pressure ulcer on her right heel. This does have some eschar overlying I Georgina Peer try to clear this away I am unsure how much is really open underneath though there appears to be some deep tissue injury around as well. Her daughter who generally takes care of her actually unfortunately had a stroke she is with her today but nonetheless is not able to do as much and does not remember as much about what is been going on treatment wise as she otherwise would. 02/22/2021 upon evaluation today patient's wound on the heel actually was doing excellent. Unfortunately she does have a deep tissue injury on the left heel which is not good and not a good sign. This is something her daughter needs to keep a close eye on we do not want this to get worse at all. In fact I am hoping it will not even reopen up completely but again time will tell we never know until it is all said and done. 04/18/2021 upon evaluation today patient unfortunately has not been seen in about 2 months. Nonetheless we have been trying to get her in they have been scheduling but have had issues with transportation that was in the no way related to being their fault. Nonetheless this has still continued to be an ongoing issue. 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 The good news is they have figured out a new way to get here through using the [facility] health transportation which is good to be definitely helpful  for them. With that being said the patient has 2 new wounds on the left heel and left ankle which were not there when I saw her last. The ankle region appears to potentially be infected we will get obtain a wound culture today. Electronic Signature(s) Signed: 04/18/2021 4:52:00 PM By: Worthy Keeler PA-C Entered By: Worthy Keeler on 04/18/2021 16:52:00 Cog[place], [name] ([id number]) -------------------------------------------------------------------------------- Physical Exam Details Patient Name: [name] Date of Service: 04/18/2021 2:45 PM Medical Record Number: [id number] Patient Account Number: [id number] Date of Birth/Sex: Jul 08, 1931 (88 y.o. F) Treating RN: Cornell Barman Primary Care Provider: Kelton Pillar Other Clinician: Referring Provider: Kelton Pillar Treating Provider/Extender: Skipper Cliche in Treatment: 51 Constitutional Well-nourished and well-hydrated in no acute distress. Respiratory normal breathing without difficulty. Psychiatric this patient is able to make decisions and demonstrates good insight into disease process. Alert and Oriented x 3. pleasant and cooperative. Notes Unfortunately patient's wounds do appear to be pressure related and that was discussed with the patient and her daughter today. I did recommend a Prevalon offloading boot they are going to look into getting this. I think that would be an ideal thing to be honest. Electronic  Signature(s) Signed: 04/18/2021 4:52:28 PM By: Worthy Keeler PA-C Entered By: Worthy Keeler on 04/18/2021 16:52:28 Gladney, [name] ([id number]) -------------------------------------------------------------------------------- Physician Orders Details Patient Name: [name] Date of Service: 04/18/2021 2:45 PM Medical Record Number: [id number] Patient Account Number: [id number] Date of Birth/Sex: [birth year] (88 y.o. F) Treating RN: Donnamarie Poag Primary Care Provider: Kelton Pillar Other Clinician: Referring Provider:  Kelton Pillar Treating Provider/Extender: Skipper Cliche in Treatment: 16 Verbal / Phone Orders: No Diagnosis Coding ICD-10 Coding Code Description L89.613 Pressure ulcer of right heel, stage 3 S30.810A Abrasion of lower back and pelvis, initial encounter E11.622 Type 2 diabetes mellitus with other skin ulcer F01.50 Vascular dementia without behavioral disturbance Follow-up Appointments o Return Appointment in 2 weeks. [place] for wound care. May utilize formulary equivalent dressing for wound treatment orders unless otherwise specified. Home Health Nurse may visit PRN to address patientos wound care needs. o Scheduled days for dressing changes to be completed; exception, patient has scheduled wound care visit that day. o **Please direct any NON-WOUND related issues/requests for orders to patient's Primary Care Physician. **If current dressing causes regression in wound condition, may D/C ordered dressing product/s and apply Normal Saline Moist Dressing daily until next [place] or Other MD appointment. **Notify W[facility] Healing Center of regression in wound condition at [phone]. Bathing/ Shower/ Hygiene o May shower; gently cleanse wound with antibacterial soap, rinse and pat dry prior to dressing wounds Anesthetic (Use 'Patient Medications' Section for Anesthetic Order Entry) o Lidocaine applied to wound bed Non-Wound Condition o Additional non-wound orders/instructions: - skin protective wipe to any DTI Off-Loading o Gel wheelchair cushion - Recommended o Turn and reposition every 2 hours - reposition every 2 hrs to prevent skin breakdown o Other: - Patient to purchase slippers with rubber soles. Education provided on measures to decrease scratching. Keeping heels protected. Recommend Prafo boot in bed available on the internet Additional Orders / Instructions o Follow  Nutritious Diet and Increase Protein Intake Wound Treatment Wound #1 - Calcaneus Wound Laterality: Right Cleanser: Normal Saline 3 x Per Week/30 Days Discharge Instructions: Wash your hands with soap and water. Remove old dressing, discard into plastic bag and place into trash. Cleanse the wound with Normal Saline prior to applying a clean dressing using gauze sponges, not tissues or cotton balls. Do not scrub or use excessive force. Pat dry using gauze sponges, not tissue or cotton balls. Primary Dressing: Iodosorb 40 (g) 3 x Per Week/30 Days Discharge Instructions: Apply IodoSorb to wound bed only as directed. Primary Dressing: Zetuvit Plus Silicone Border Dressing 5x5 (in/in) (Generic) 3 x Per Week/30 Days Discharge Instructions: Secure the collagen Wound #3 - Calcaneus Wound Laterality: Left Cleanser: Normal Saline 3 x Per Week/30 Days Horlacher, Maelyn E. ([id number]) Discharge Instructions: Wash your hands with soap and water. Remove old dressing, discard into plastic bag and place into trash. Cleanse the wound with Normal Saline prior to applying a clean dressing using gauze sponges, not tissues or cotton balls. Do not scrub or use excessive force. Pat dry using gauze sponges, not tissue or cotton balls. Primary Dressing: Iodosorb 40 (g) 3 x Per Week/30 Days Discharge Instructions: Apply IodoSorb to wound bed only as directed. Primary Dressing: Zetuvit Plus Silicone Border Dressing 5x5 (in/in) (Generic) 3 x Per Week/30 Days Discharge Instructions: Secure the collagen Wound #4 - Malleolus Wound Laterality: Left, Medial Cleanser: Normal Saline 3 x Per Week/30 Days Discharge Instructions:  Wash your hands with soap and water. Remove old dressing, discard into plastic bag and place into trash. Cleanse the wound with Normal Saline prior to applying a clean dressing using gauze sponges, not tissues or cotton balls. Do not scrub or use excessive force. Pat dry using gauze sponges, not tissue or  cotton balls. Primary Dressing: Iodosorb 40 (g) 3 x Per Week/30 Days Discharge Instructions: Apply IodoSorb to wound bed only as directed. Primary Dressing: Zetuvit Plus Silicone Border Dressing 5x5 (in/in) (Generic) 3 x Per Week/30 Days Discharge Instructions: Secure the collagen Laboratory o Bacteria identified in Wound by Culture (MICRO) - left malleolus oooo LOINC Code: 8676-7 oooo Convenience Name: Wound culture routine Electronic Signature(s) Signed: 04/18/2021 4:47:36 PM By: Donnamarie Poag Signed: 04/18/2021 5:14:54 PM By: Worthy Keeler PA-C Entered By: Donnamarie Poag on 04/18/2021 15:47:38 Haskett, [name] ([id number]) -------------------------------------------------------------------------------- Problem List Details Patient Name: [name] Date of Service: 04/18/2021 2:45 PM Medical Record Number: [id number] Patient Account Number: [id number] Date of Birth/Sex: Feb 28, 1932 (88 y.o. F) Treating RN: Cornell Barman Primary Care Provider: Kelton Pillar Other Clinician: Referring Provider: Kelton Pillar Treating Provider/Extender: Skipper Cliche in Treatment: 16 Active Problems ICD-10 Encounter Code Description Active Date MDM Diagnosis L89.613 Pressure ulcer of right heel, stage 3 12/25/2020 No Yes L89.623 Pressure ulcer of left heel, stage 3 04/18/2021 No Yes L89.893 Pressure ulcer of other site, stage 3 04/18/2021 No Yes S30.810A Abrasion of lower back and pelvis, initial encounter 01/10/2021 No Yes E11.622 Type 2 diabetes mellitus with other skin ulcer 12/25/2020 No Yes F01.50 Vascular dementia without behavioral disturbance 12/25/2020 No Yes Inactive Problems Resolved Problems Electronic Signature(s) Signed: 04/18/2021 3:34:00 PM By: Worthy Keeler PA-C Previous Signature: 04/18/2021 3:16:23 PM Version By: Worthy Keeler PA-C Entered By: Worthy Keeler on 04/18/2021 15:34:00 Pharris, [name]  ([id number]) -------------------------------------------------------------------------------- Progress Note Details Patient Name: [name] Date of Service: 04/18/2021 2:45 PM Medical Record Number: [id number] Patient Account Number: [id number] Date of Birth/Sex: Nov 29, 1931 (88 y.o. F) Treating RN: Cornell Barman Primary Care Provider: Kelton Pillar Other Clinician: Referring Provider: Kelton Pillar Treating Provider/Extender: Skipper Cliche in Treatment: 16 Subjective Chief Complaint Information obtained from Patient Right heel pressure, left heel pressure ulcer, and left ankle pressure ulcer History of Present Illness (HPI) 12/25/20 upon evaluation today patient presents for a wound on her heel which has been present for about 1 and half months or so according to her daughter. The patient does have dementia. With that being said her daughter is the primary caregiver and seems to be doing an excellent job. She had a fairly significant wound on the heel with eschar covering however this was lifting up and it appears to be mostly healed underneath the biggest issue is on the posterior aspect of the heel where she has a small slit of an opening in the Achilles region which is actually what is mainly painful for her today. We do not have a hemoglobin A1c as the patient actually is seen by a provider outside of the [facility] system so I am not able to look this up. With that being said I do not see any evidence of active infection and I do think that she is actually doing quite well I think her daughter is doing a great job taking care of the wound in particular. With regard to her past medical history she does have dementia and diabetes mellitus type 2 but otherwise no major medical problems. She did have an x-ray performed  11/27/2020 which was negative for any signs of osteomyelitis. 01/03/2021 upon evaluation today patient appears to be doing well with regard to her heel ulcer. She has been  tolerating the dressing changes without complication the collagen seems to be doing a good job for her. I do not see any signs of infection at this point. Unfortunately the patient is stated to have a wound on her bottom although her daughter is not sure how bad this really is next week we will get a get her into a room with a bed so that we can actually check this out I do not want things to get worse unnecessarily. 01/10/2021 upon evaluation today patient's wound actually appears to be doing well in regard to her heel there is some slough noted I Georgina Peer clear this away with some sharp debridement today. With regard to the gluteal region there is some abrasion noted here. This seems to be very superficial and I think it is from her scratching mainly they have been using Desitin I think she could benefit from possibly utilizing some AandE ointment which I think could be of benefit for her. Fortunately there does not appear to be any signs of active infection systemically which is great news. 01/17/2021 upon evaluation today patient's wound on the gluteal region actually appears to be healed and everything is doing quite well. I am very pleased in that regard. With regard to the wound on her heel that is measuring smaller and looking much better and very pleased with what I see as well today. 01/25/2021 upon evaluation today patient appears to be doing well in regard to her wounds. She has been actually healing quite nicely in regard to the heel and the gluteal region also seems to be doing quite well. I do not see any evidence of infection which is great. She had several areas of scratching that she had been messing with but again nothing that appears to be open at the this point. 02/15/2021 upon evaluation patient's wounds actually appear to be doing decently well. At this point I am still concerned about the pressure ulcer on her right heel. This does have some eschar overlying I Georgina Peer try to clear  this away I am unsure how much is really open underneath though there appears to be some deep tissue injury around as well. Her daughter who generally takes care of her actually unfortunately had a stroke she is with her today but nonetheless is not able to do as much and does not remember as much about what is been going on treatment wise as she otherwise would. 02/22/2021 upon evaluation today patient's wound on the heel actually was doing excellent. Unfortunately she does have a deep tissue injury on the left heel which is not good and not a good sign. This is something her daughter needs to keep a close eye on we do not want this to get worse at all. In fact I am hoping it will not even reopen up completely but again time will tell we never know until it is all said and done. 04/18/2021 upon evaluation today patient unfortunately has not been seen in about 2 months. Nonetheless we have been trying to get her in they have been scheduling but have had issues with transportation that was in the no way related to being their fault. Nonetheless this has still continued to be an ongoing issue. 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 The good news is they have figured out a new way  to get here through using the [facility] health transportation which is good to be definitely helpful for them. With that being said the patient has 2 new wounds on the left heel and left ankle which were not there when I saw her last. The ankle region appears to potentially be infected we will get obtain a wound culture today. Objective Constitutional Schlosser, Tionne E. ([id number]) Well-nourished and well-hydrated in no acute distress. Vitals Time Taken: 3:07 PM, Height: 63 in, Weight: 196 lbs, BMI: 34.7, Temperature: 98.2 F, Pulse: 76 bpm, Respiratory Rate: 16 breaths/min, Blood Pressure: 148/78 mmHg. Respiratory normal breathing without difficulty. Psychiatric this patient is able to make decisions and demonstrates good insight  into disease process. Alert and Oriented x 3. pleasant and cooperative. General Notes: Unfortunately patient's wounds do appear to be pressure related and that was discussed with the patient and her daughter today. I did recommend a Prevalon offloading boot they are going to look into getting this. I think that would be an ideal thing to be honest. Integumentary (Hair, Skin) Wound #1 status is Open. Original cause of wound was Pressure Injury. The date acquired was: 12/25/2020. The wound has been in treatment 16 weeks. The wound is located on the Right Calcaneus. The wound measures 0.5cm length x 1.5cm width x 0.1cm depth; 0.589cm^2 area and 0.059cm^3 volume. There is Fat Layer (Subcutaneous Tissue) exposed. There is no tunneling or undermining noted. There is a medium amount of serosanguineous drainage noted. There is large (67-100%) pink granulation within the wound bed. There is a small (1-33%) amount of necrotic tissue within the wound bed including Adherent Slough. Wound #3 status is Open. Original cause of wound was Gradually Appeared. The date acquired was: 04/10/2021. The wound is located on the Left Calcaneus. The wound measures 0.8cm length x 1.5cm width x 0.2cm depth; 0.942cm^2 area and 0.188cm^3 volume. There is Fat Layer (Subcutaneous Tissue) exposed. There is no tunneling or undermining noted. There is a medium amount of serosanguineous drainage noted. There is medium (34-66%) red, pink granulation within the wound bed. There is a medium (34-66%) amount of necrotic tissue within the wound bed including Adherent Slough. Wound #4 status is Open. Original cause of wound was Gradually Appeared. The date acquired was: 04/10/2021. The wound is located on the Left,Medial Malleolus. The wound measures 0.5cm length x 0.7cm width x 0.2cm depth; 0.275cm^2 area and 0.055cm^3 volume. There is Fat Layer (Subcutaneous Tissue) exposed. There is no tunneling or undermining noted. There is a medium amount of  serosanguineous drainage noted. There is medium (34-66%) red, pink granulation within the wound bed. There is a medium (34-66%) amount of necrotic tissue within the wound bed including Adherent Slough. Other Condition(s) Patient presents with Suspected Deep Tissue Injury located on the Left Foot. Assessment Active Problems ICD-10 Pressure ulcer of right heel, stage 3 Pressure ulcer of left heel, stage 3 Pressure ulcer of other site, stage 3 Abrasion of lower back and pelvis, initial encounter Type 2 diabetes mellitus with other skin ulcer Vascular dementia without behavioral disturbance Plan Follow-up Appointments: Return Appointment in 2 weeks. Home Health: [place] for wound care. May utilize formulary equivalent dressing for wound treatment orders unless otherwise specified. Home Health Nurse may visit PRN to address patient s wound care needs. Scheduled days for dressing changes to be completed; exception, patient has scheduled wound care visit that day. **Please direct any NON-WOUND related issues/requests for orders to patient's Primary Care Physician. **If current  dressing causes regression in wound condition, may D/C ordered dressing product/s and apply Normal Saline Moist Dressing daily until next [place] or Other MD appointment. **Notify [facility] of regression in wound condition at [phone]. Bathing/ Shower/ Hygiene: May shower; gently cleanse wound with antibacterial soap, rinse and pat dry prior to dressing wounds Anesthetic (Use 'Patient Medications' Section for Anesthetic Order Entry): Lidocaine applied to wound bed Non-Wound Condition: Additional non-wound orders/instructions: - skin protective wipe to any DTI Off-Loading: Cardella, Sindia E. ([id number]) Gel wheelchair cushion - Recommended Turn and reposition every 2 hours - reposition every 2 hrs to prevent skin breakdown Other: - Patient to  purchase slippers with rubber soles. Education provided on measures to decrease scratching. Keeping heels protected. Recommend Prafo boot in bed available on the internet Additional Orders / Instructions: Follow Nutritious Diet and Increase Protein Intake Laboratory ordered were: Wound culture routine - left malleolus WOUND #1: - Calcaneus Wound Laterality: Right Cleanser: Normal Saline 3 x Per Week/30 Days Discharge Instructions: Wash your hands with soap and water. Remove old dressing, discard into plastic bag and place into trash. Cleanse the wound with Normal Saline prior to applying a clean dressing using gauze sponges, not tissues or cotton balls. Do not scrub or use excessive force. Pat dry using gauze sponges, not tissue or cotton balls. Primary Dressing: Iodosorb 40 (g) 3 x Per Week/30 Days Discharge Instructions: Apply IodoSorb to wound bed only as directed. Primary Dressing: Zetuvit Plus Silicone Border Dressing 5x5 (in/in) (Generic) 3 x Per Week/30 Days Discharge Instructions: Secure the collagen WOUND #3: - Calcaneus Wound Laterality: Left Cleanser: Normal Saline 3 x Per Week/30 Days Discharge Instructions: Wash your hands with soap and water. Remove old dressing, discard into plastic bag and place into trash. Cleanse the wound with Normal Saline prior to applying a clean dressing using gauze sponges, not tissues or cotton balls. Do not scrub or use excessive force. Pat dry using gauze sponges, not tissue or cotton balls. Primary Dressing: Iodosorb 40 (g) 3 x Per Week/30 Days Discharge Instructions: Apply IodoSorb to wound bed only as directed. Primary Dressing: Zetuvit Plus Silicone Border Dressing 5x5 (in/in) (Generic) 3 x Per Week/30 Days Discharge Instructions: Secure the collagen WOUND #4: - Malleolus Wound Laterality: Left, Medial Cleanser: Normal Saline 3 x Per Week/30 Days Discharge Instructions: Wash your hands with soap and water. Remove old dressing, discard into  plastic bag and place into trash. Cleanse the wound with Normal Saline prior to applying a clean dressing using gauze sponges, not tissues or cotton balls. Do not scrub or use excessive force. Pat dry using gauze sponges, not tissue or cotton balls. Primary Dressing: Iodosorb 40 (g) 3 x Per Week/30 Days Discharge Instructions: Apply IodoSorb to wound bed only as directed. Primary Dressing: Zetuvit Plus Silicone Border Dressing 5x5 (in/in) (Generic) 3 x Per Week/30 Days Discharge Instructions: Secure the collagen 1. Would recommend currently based on what I am seeing that we go ahead and send in a culture. This was sent out for further evaluation. If the culture comes back positive we will make any adjustments in care as needed at that point. 2. I am also can recommend at this point that we have the patient continue to utilize appropriate dressings were to switch over to Iodosorb at this time. I am also going to suggest a Zetuvit bordered foam dressings to cover. 3. Also I do believe that a Prevalon offloading boot would be of benefit for the patient this was discussed  with her daughter she is good to look into getting those for her mother. We will see patient back for reevaluation in 2 weeks here in the clinic. If anything worsens or changes patient will contact our office for additional recommendations. Home health to change her dressings in the off weeks when we do not see her. Electronic Signature(s) Signed: 04/18/2021 4:53:10 PM By: Worthy Keeler PA-C Entered By: Worthy Keeler on 04/18/2021 16:53:10 [place], [name] ([id number]) -------------------------------------------------------------------------------- SuperBill Details Patient Name: [name] Date of Service: 04/18/2021 Medical Record Number: [id number] Patient Account Number: [id number] Date of Birth/Sex: 1932-01-22 (88 y.o. F) Treating RN: Donnamarie Poag Primary Care Provider: Kelton Pillar Other Clinician: Referring Provider:  Kelton Pillar Treating Provider/Extender: Skipper Cliche in Treatment: 16 Diagnosis Coding ICD-10 Codes Code Description [phone] Pressure ulcer of right heel, stage 3 L89.623 Pressure ulcer of left heel, stage 3 L89.893 Pressure ulcer of other site, stage 3 S30.810A Abrasion of lower back and pelvis, initial encounter E11.622 Type 2 diabetes mellitus with other skin ulcer Vascular dementia, unspecified severity, without behavioral disturbance, psychotic disturbance, mood disturbance, and F01.50 anxiety Facility Procedures CPT4 Code: [id number] Description: 99214 - WOUND CARE VISIT-LEV 4 EST PT Modifier: Quantity: 1 Physician Procedures CPT4 Code: [id number] Description: 63335 - WC PHYS LEVEL 4 - EST PT Modifier: Quantity: 1 CPT4 Code: Description: ICD-10 Diagnosis Description L89.613 Pressure ulcer of right heel, stage 3 L89.623 Pressure ulcer of left heel, stage 3 L89.893 Pressure ulcer of other site, stage 3 S30.810A Abrasion of lower back and pelvis, initial encounter Modifier: Quantity: Electronic Signature(s) Signed: 04/18/2021 5:04:53 PM By: Worthy Keeler PA-C Previous Signature: 04/18/2021 4:47:36 PM Version By: Donnamarie Poag Entered By: Worthy Keeler on 04/18/2021 17:04:52

## 2021-04-19 DIAGNOSIS — E1122 Type 2 diabetes mellitus with diabetic chronic kidney disease: Secondary | ICD-10-CM | POA: Diagnosis not present

## 2021-04-19 DIAGNOSIS — Z7984 Long term (current) use of oral hypoglycemic drugs: Secondary | ICD-10-CM | POA: Diagnosis not present

## 2021-04-19 DIAGNOSIS — Z993 Dependence on wheelchair: Secondary | ICD-10-CM | POA: Diagnosis not present

## 2021-04-19 DIAGNOSIS — K2270 Barrett's esophagus without dysplasia: Secondary | ICD-10-CM | POA: Diagnosis not present

## 2021-04-19 DIAGNOSIS — K449 Diaphragmatic hernia without obstruction or gangrene: Secondary | ICD-10-CM | POA: Diagnosis not present

## 2021-04-19 DIAGNOSIS — Z9181 History of falling: Secondary | ICD-10-CM | POA: Diagnosis not present

## 2021-04-19 DIAGNOSIS — E049 Nontoxic goiter, unspecified: Secondary | ICD-10-CM | POA: Diagnosis not present

## 2021-04-19 DIAGNOSIS — Z4800 Encounter for change or removal of nonsurgical wound dressing: Secondary | ICD-10-CM | POA: Diagnosis not present

## 2021-04-19 DIAGNOSIS — E7800 Pure hypercholesterolemia, unspecified: Secondary | ICD-10-CM | POA: Diagnosis not present

## 2021-04-19 DIAGNOSIS — N6019 Diffuse cystic mastopathy of unspecified breast: Secondary | ICD-10-CM | POA: Diagnosis not present

## 2021-04-19 DIAGNOSIS — L89620 Pressure ulcer of left heel, unstageable: Secondary | ICD-10-CM | POA: Diagnosis not present

## 2021-04-19 DIAGNOSIS — Z87891 Personal history of nicotine dependence: Secondary | ICD-10-CM | POA: Diagnosis not present

## 2021-04-19 DIAGNOSIS — H409 Unspecified glaucoma: Secondary | ICD-10-CM | POA: Diagnosis not present

## 2021-04-19 DIAGNOSIS — N189 Chronic kidney disease, unspecified: Secondary | ICD-10-CM | POA: Diagnosis not present

## 2021-04-19 DIAGNOSIS — K5790 Diverticulosis of intestine, part unspecified, without perforation or abscess without bleeding: Secondary | ICD-10-CM | POA: Diagnosis not present

## 2021-04-19 DIAGNOSIS — D126 Benign neoplasm of colon, unspecified: Secondary | ICD-10-CM | POA: Diagnosis not present

## 2021-04-19 DIAGNOSIS — I10 Essential (primary) hypertension: Secondary | ICD-10-CM | POA: Diagnosis not present

## 2021-04-19 DIAGNOSIS — L89612 Pressure ulcer of right heel, stage 2: Secondary | ICD-10-CM | POA: Diagnosis not present

## 2021-04-19 NOTE — Progress Notes (Signed)
Kallman, LORRINE KILLILEA ([id number]) Visit Report for 04/18/2021 Arrival Information Details Patient Name: Laughery, DAYLYNN STUMPP Date of Service: 04/18/2021 2:45 PM Medical Record Number: [id number] Patient Account Number: [id number] Date of Birth/Sex: [birth year] (88 y.o. F) Treating RN: Donnamarie Poag Primary Care Karne Ozga: Kelton Pillar Other Clinician: Referring Noreta Kue: Kelton Pillar Treating Daesean Lazarz/Extender: Skipper Cliche in Treatment: 16 Visit Information History Since Last Visit Added or deleted any medications: No Patient Arrived: Wheel Chair Had a fall or experienced change in No Arrival Time: 15:03 activities of daily living that may affect Accompanied By: daughter risk of falls: Transfer Assistance: Civil Service fast streamer Hospitalized since last visit: No Patient Identification Verified: Yes Has Dressing in Place as Prescribed: Yes Secondary Verification Process Completed: Yes Pain Present Now: No Patient Has Alerts: Yes Patient Alerts: Type II Diabetic HOYER/put in chair [facility] Electronic Signature(s) Signed: 04/18/2021 4:47:36 PM By: Donnamarie Poag Entered By: Donnamarie Poag on 04/18/2021 15:19:38 Scheel, [name] ([id number]) -------------------------------------------------------------------------------- Clinic Level of Care Assessment Details Patient Name: Hohn, [name] Date of Service: 04/18/2021 2:45 PM Medical Record Number: [id number] Patient Account Number: [id number] Date of Birth/Sex: 1932/01/01 (88 y.o. F) Treating RN: Donnamarie Poag Primary Care [employer]: Kelton Pillar Other Clinician: Referring Jeovanni Heuring: Kelton Pillar Treating Danamarie Minami/Extender: Skipper Cliche in Treatment: 16 Clinic Level of Care Assessment Items TOOL 4 Quantity Score []  - Use when only an EandM is performed on FOLLOW-UP visit 0 ASSESSMENTS - Nursing Assessment / Reassessment []  - Reassessment of Co-morbidities (includes updates in patient status) 0 []  - 0 Reassessment of Adherence to Treatment  Plan ASSESSMENTS - Wound and Skin Assessment / Reassessment []  - Simple Wound Assessment / Reassessment - one wound 0 X- 3 5 Complex Wound Assessment / Reassessment - multiple wounds []  - 0 Dermatologic / Skin Assessment (not related to wound area) ASSESSMENTS - Focused Assessment []  - Circumferential Edema Measurements - multi extremities 0 []  - 0 Nutritional Assessment / Counseling / Intervention []  - 0 Lower Extremity Assessment (monofilament, tuning fork, pulses) []  - 0 Peripheral Arterial Disease Assessment (using hand held doppler) ASSESSMENTS - Ostomy and/or Continence Assessment and Care []  - Incontinence Assessment and Management 0 []  - 0 Ostomy Care Assessment and Management (repouching, etc.) PROCESS - Coordination of Care X - Simple Patient / Family Education for ongoing care 1 15 []  - 0 Complex (extensive) Patient / Family Education for ongoing care []  - 0 Staff obtains Programmer, systems, Records, Test Results / Process Orders X- 1 10 Staff telephones HHA, Nursing Homes / Clarify orders / etc []  - 0 Routine Transfer to another Facility (non-emergent condition) []  - 0 Routine Hospital Admission (non-emergent condition) []  - 0 New Admissions / Biomedical engineer / Ordering NPWT, Apligraf, etc. []  - 0 Emergency Hospital Admission (emergent condition) X- 1 10 Simple Discharge Coordination []  - 0 Complex (extensive) Discharge Coordination PROCESS - Special Needs []  - Pediatric / Minor Patient Management 0 []  - 0 Isolation Patient Management []  - 0 Hearing / Language / Visual special needs []  - 0 Assessment of Community assistance (transportation, D/C planning, etc.) []  - 0 Additional assistance / Altered mentation []  - 0 Support Surface(s) Assessment (bed, cushion, seat, etc.) INTERVENTIONS - Wound Cleansing / Measurement Captain, Inita E. ([id number]) []  - 0 Simple Wound Cleansing - one wound X- 3 5 Complex Wound Cleansing - multiple wounds X- 1 5 Wound  Imaging (photographs - any number of wounds) []  - 0 Wound Tracing (instead of photographs) []  - 0 Simple Wound Measurement - one wound X-  3 5 Complex Wound Measurement - multiple wounds INTERVENTIONS - Wound Dressings X - Small Wound Dressing one or multiple wounds 3 10 $Re'[]'Nnx$  - 0 Medium Wound Dressing one or multiple wounds $RemoveBeforeD'[]'wdSMsEJSHOVsWP$  - 0 Large Wound Dressing one or multiple wounds X- 1 5 Application of Medications - topical $RemoveB'[]'AMlkjKtW$  - 0 Application of Medications - injection INTERVENTIONS - Miscellaneous $RemoveBeforeD'[]'gCKaZUGdmbAtUx$  - External ear exam 0 $Remo'[]'dlUuy$  - 0 Specimen Collection (cultures, biopsies, blood, body fluids, etc.) $RemoveBefor'[]'jMMHtGQbJRln$  - 0 Specimen(s) / Culture(s) sent or taken to Lab for analysis $RemoveBefo'[]'vieHJxaaZxL$  - 0 Patient Transfer (multiple staff / Civil Service fast streamer / Similar devices) $RemoveBeforeDE'[]'oFAhfwChouHvdwe$  - 0 Simple Staple / Suture removal (25 or less) $Remove'[]'BsNwQEK$  - 0 Complex Staple / Suture removal (26 or more) $Remove'[]'ZhGDsZL$  - 0 Hypo / Hyperglycemic Management (close monitor of Blood Glucose) $RemoveBefore'[]'LxOeDUkUgkECL$  - 0 Ankle / Brachial Index (ABI) - do not check if billed separately X- 1 5 Vital Signs Has the patient been seen at the hospital within the last three years: Yes Total Score: 125 Level Of Care: New/Established - Level 4 Electronic Signature(s) Signed: 04/18/2021 4:47:36 PM By: Donnamarie Poag Entered By: Donnamarie Poag on 04/18/2021 15:45:04 Sawatzky, [name] ([id number]) -------------------------------------------------------------------------------- Complex / Palliative Patient Assessment Details Patient Name: [name] Date of Service: 04/18/2021 2:45 PM Medical Record Number: [id number] Patient Account Number: [id number] Date of Birth/Sex: [birth year] (88 y.o. F) Treating RN: Donnamarie Poag Primary Care Osiris Charles: Kelton Pillar Other Clinician: Referring Dakoda Bassette: Kelton Pillar Treating Roby Spalla/Extender: Skipper Cliche in Treatment: 16 Complex Wound Management Criteria Patient has remarkable or complex co-morbidities requiring medications or treatments that extend wound healing  times. Examples: o Diabetes mellitus with chronic renal failure or end stage renal disease requiring dialysis o Advanced or poorly controlled rheumatoid arthritis o Diabetes mellitus and end stage chronic obstructive pulmonary disease o Active cancer with current chemo- or radiation therapy advancing dementia/unable to bear weight/missed appt Palliative Wound Management Criteria Care Approach Wound Care Plan: Complex Wound Management Electronic Signature(s) Signed: 04/18/2021 4:40:14 PM By: Donnamarie Poag Signed: 04/18/2021 5:14:54 PM By: Worthy Keeler PA-C Previous Signature: 04/18/2021 4:37:32 PM Version By: Donnamarie Poag Entered By: Donnamarie Poag on 04/18/2021 16:40:14 Hollabaugh, [name] ([id number]) -------------------------------------------------------------------------------- Encounter Discharge Information Details Patient Name: [name] Date of Service: 04/18/2021 2:45 PM Medical Record Number: [id number] Patient Account Number: [id number] Date of Birth/Sex: 09-01-1931 (88 y.o. F) Treating RN: Donnamarie Poag Primary Care Stevan Eberwein: Kelton Pillar Other Clinician: Referring Joyell Emami: Kelton Pillar Treating Kreg Earhart/Extender: Skipper Cliche in Treatment: 16 Encounter Discharge Information Items Discharge Condition: Stable Ambulatory Status: Wheelchair Discharge Destination: Home Transportation: Other Accompanied By: daughter Schedule Follow-up Appointment: Yes Clinical Summary of Care: Electronic Signature(s) Signed: 04/18/2021 4:47:36 PM By: Donnamarie Poag Entered By: Donnamarie Poag on 04/18/2021 15:57:54 Alkire, [name] ([id number]) -------------------------------------------------------------------------------- Lower Extremity Assessment Details Patient Name: [name] Date of Service: 04/18/2021 2:45 PM Medical Record Number: [id number] Patient Account Number: [id number] Date of Birth/Sex: Jul 25, 1931 (88 y.o. F) Treating RN: Donnamarie Poag Primary Care Lakecia Deschamps: Kelton Pillar Other  Clinician: Referring Odelia Graciano: Kelton Pillar Treating Maliq Pilley/Extender: Jeri Cos Weeks in Treatment: 16 Edema Assessment Assessed: [Left: Yes] [Right: Yes] Edema: [Left: No] [Right: No] Vascular Assessment Pulses: Dorsalis Pedis Palpable: [Left:Yes] [Right:Yes] Electronic Signature(s) Signed: 04/18/2021 4:47:36 PM By: Donnamarie Poag Entered By: Donnamarie Poag on 04/18/2021 15:17:10 Bourget, [name] ([id number]) -------------------------------------------------------------------------------- Multi Wound Chart Details Patient Name: [name] Date of Service: 04/18/2021 2:45 PM Medical Record Number: [id number] Patient Account Number: [id number] Date of Birth/Sex: 01/27/1932 (88  y.o. F) Treating RN: Donnamarie Poag Primary Care Lexine Jaspers: Kelton Pillar Other Clinician: Referring Cyrilla Durkin: Kelton Pillar Treating Sangeeta Youse/Extender: Skipper Cliche in Treatment: 16 Vital Signs Height(in): 64 Pulse(bpm): 34 Weight(lbs): 196 Blood Pressure(mmHg): 148/78 Body Mass Index(BMI): 34.7 Temperature(F): 98.2 Respiratory Rate(breaths/min): 16 Photos: Wound Location: Right Calcaneus Left Calcaneus Left, Medial Malleolus Wounding Event: Pressure Injury Gradually Appeared Gradually Appeared Primary Etiology: Pressure Ulcer Pressure Ulcer Pressure Ulcer Comorbid History: Hypertension, Type II Diabetes, Hypertension, Type II Diabetes, Hypertension, Type II Diabetes, Osteoarthritis, Dementia Osteoarthritis, Dementia Osteoarthritis, Dementia Date Acquired: 12/25/2020 04/10/2021 04/10/2021 Weeks of Treatment: 16 0 0 Wound Status: Open Open Open Wound Recurrence: No No No Measurements L x W x D (cm) 0.5x1.5x0.1 0.8x1.5x0.2 0.5x0.7x0.2 Area (cm) : 0.589 0.942 0.275 Volume (cm) : 0.059 0.188 0.055 % Reduction in Area: -134.70% N/A N/A % Reduction in Volume: -18.00% N/A N/A Classification: Unstageable/Unclassified Category/Stage III Category/Stage III Exudate Amount: Medium Medium Medium Exudate  Type: Serosanguineous Serosanguineous Serosanguineous Exudate Color: red, brown red, brown red, brown Granulation Amount: Large (67-100%) Medium (34-66%) Medium (34-66%) Granulation Quality: Pink Red, Pink Red, Pink Necrotic Amount: Small (1-33%) Medium (34-66%) Medium (34-66%) Exposed Structures: Fat Layer (Subcutaneous Tissue): Fat Layer (Subcutaneous Tissue): Fat Layer (Subcutaneous Tissue): Yes Yes Yes Fascia: No Fascia: No Fascia: No Tendon: No Tendon: No Tendon: No Muscle: No Muscle: No Muscle: No Joint: No Joint: No Joint: No Bone: No Bone: No Bone: No Epithelialization: None N/A N/A Treatment Notes Electronic Signature(s) Signed: 04/18/2021 4:47:36 PM By: Donnamarie Poag Entered By: Donnamarie Poag on 04/18/2021 15:21:19 Blahnik, [name] ([id number]) -------------------------------------------------------------------------------- Multi-Disciplinary Care Plan Details Patient Name: [name] Date of Service: 04/18/2021 2:45 PM Medical Record Number: [id number] Patient Account Number: [id number] Date of Birth/Sex: February 03, 1932 (88 y.o. F) Treating RN: Donnamarie Poag Primary Care Victoria Henshaw: Kelton Pillar Other Clinician: Referring Brittinie Wherley: Kelton Pillar Treating Leanor Voris/Extender: Skipper Cliche in Treatment: 16 Active Inactive Pressure Nursing Diagnoses: Knowledge deficit related to causes and risk factors for pressure ulcer development Knowledge deficit related to management of pressures ulcers Potential for impaired tissue integrity related to pressure, friction, moisture, and shear Goals: Patient will remain free from development of additional pressure ulcers Date Initiated: 12/25/2020 Target Resolution Date: 01/25/2021 Goal Status: Active Patient will remain free of pressure ulcers Date Initiated: 12/25/2020 Target Resolution Date: 01/25/2021 Goal Status: Active Patient/caregiver will verbalize risk factors for pressure ulcer development Date Initiated:  12/25/2020 Date Inactivated: 04/18/2021 Target Resolution Date: 12/25/2020 Goal Status: Met Patient/caregiver will verbalize understanding of pressure ulcer management Date Initiated: 12/25/2020 Date Inactivated: 04/18/2021 Target Resolution Date: 12/25/2020 Goal Status: Met Interventions: Assess: immobility, friction, shearing, incontinence upon admission and as needed Assess offloading mechanisms upon admission and as needed Assess potential for pressure ulcer upon admission and as needed Provide education on pressure ulcers Notes: Wound/Skin Impairment Nursing Diagnoses: Impaired tissue integrity Goals: Patient/caregiver will verbalize understanding of skin care regimen Date Initiated: 12/25/2020 Date Inactivated: 01/25/2021 Target Resolution Date: 12/25/2020 Goal Status: Met Ulcer/skin breakdown will have a volume reduction of 30% by week 4 Date Initiated: 12/25/2020 Date Inactivated: 04/18/2021 Target Resolution Date: 01/25/2021 Goal Status: Unmet Unmet Reason: missed appt/con't tx Ulcer/skin breakdown will have a volume reduction of 50% by week 8 Date Initiated: 12/25/2020 Date Inactivated: 04/18/2021 Target Resolution Date: 02/24/2021 Goal Status: Unmet Unmet Reason: missed appt/con't tx Ulcer/skin breakdown will have a volume reduction of 80% by week 12 Date Initiated: 12/25/2020 Target Resolution Date: 03/27/2021 Goal Status: Active Ulcer/skin breakdown will heal within 14 weeks Date Initiated: 12/25/2020 Target Resolution  Date: 04/27/2021 Goal Status: Active Interventions: Assess patient/caregiver ability to obtain necessary supplies Hanf, Ester E. ([id number]) Assess patient/caregiver ability to perform ulcer/skin care regimen upon admission and as needed Assess ulceration(s) every visit Provide education on ulcer and skin care Treatment Activities: Referred to DME Babbie Dondlinger for dressing supplies : 12/25/2020 Skin care regimen initiated :  12/25/2020 Notes: Electronic Signature(s) Signed: 04/18/2021 4:47:36 PM By: Donnamarie Poag Entered By: Donnamarie Poag on 04/18/2021 15:21:00 Pinales, [name] ([id number]) -------------------------------------------------------------------------------- Non-Wound Condition Assessment Details Patient Name: [name] Date of Service: 04/18/2021 2:45 PM Medical Record Number: [id number] Patient Account Number: [id number] Date of Birth/Sex: 1931-04-30 (88 y.o. F) Treating RN: Donnamarie Poag Primary Care Arjay Jaskiewicz: Kelton Pillar Other Clinician: Referring Erlene Devita: Kelton Pillar Treating Nadiyah Zeis/Extender: Skipper Cliche in Treatment: 16 Non-Wound Condition: Condition: Suspected Deep Tissue Injury Location: Foot Side: Left left calcaneus suspected deep tissue injury. Patient and daughter educated on importance of wearing booties while Notes: in bed and placing pillow under bilat calf to off load heels. Photos Electronic Signature(s) Signed: 04/18/2021 4:47:36 PM By: Donnamarie Poag Entered By: Donnamarie Poag on 04/18/2021 15:16:36 Sudberry, [name] ([id number]) -------------------------------------------------------------------------------- Pain Assessment Details Patient Name: [name] Date of Service: 04/18/2021 2:45 PM Medical Record Number: [id number] Patient Account Number: [id number] Date of Birth/Sex: [birth year] (88 y.o. F) Treating RN: Donnamarie Poag Primary Care Jolee Critcher: Kelton Pillar Other Clinician: Referring Arletta Lumadue: Kelton Pillar Treating Teah Votaw/Extender: Skipper Cliche in Treatment: 16 Active Problems Location of Pain Severity and Description of Pain Patient Has Paino No Site Locations Rate the pain. Current Pain Level: 0 Pain Management and Medication Current Pain Management: Electronic Signature(s) Signed: 04/18/2021 4:47:36 PM By: Donnamarie Poag Entered By: Donnamarie Poag on 04/18/2021 15:09:16 Gashi, [name]  ([id number]) -------------------------------------------------------------------------------- Patient/Caregiver Education Details Patient Name: [name] Date of Service: 04/18/2021 2:45 PM Medical Record Number: [id number] Patient Account Number: [id number] Date of Birth/Gender: 09-29-1931 (88 y.o. F) Treating RN: Donnamarie Poag Primary Care Physician: Kelton Pillar Other Clinician: Referring Physician: Kelton Pillar Treating Physician/Extender: Skipper Cliche in Treatment: 16 Education Assessment Education Provided To: Patient Education Topics Provided Basic Hygiene: Nutrition: Offloading: Pressure: Wound/Skin Impairment: Electronic Signature(s) Signed: 04/18/2021 4:47:36 PM By: Donnamarie Poag Entered By: Donnamarie Poag on 04/18/2021 15:46:12 He, [name] ([id number]) -------------------------------------------------------------------------------- Wound Assessment Details Patient Name: [name] Date of Service: 04/18/2021 2:45 PM Medical Record Number: [id number] Patient Account Number: [id number] Date of Birth/Sex: 01/09/1932 (88 y.o. F) Treating RN: Donnamarie Poag Primary Care Mettie Roylance: Kelton Pillar Other Clinician: Referring Makalia Bare: Kelton Pillar Treating Martisha Toulouse/Extender: Skipper Cliche in Treatment: 16 Wound Status Wound Number: 1 Primary Etiology: Pressure Ulcer Wound Location: Right Calcaneus Wound Status: Open Wounding Event: Pressure Injury Comorbid Hypertension, Type II Diabetes, Osteoarthritis, History: Dementia Date Acquired: 12/25/2020 Weeks Of Treatment: 16 Clustered Wound: No Photos Wound Measurements Length: (cm) 0.5 Width: (cm) 1.5 Depth: (cm) 0.1 Area: (cm) 0.589 Volume: (cm) 0.059 % Reduction in Area: -134.7% % Reduction in Volume: -18% Epithelialization: None Tunneling: No Undermining: No Wound Description Classification: Unstageable/Unclassified Exudate Amount: Medium Exudate Type: Serosanguineous Exudate Color: red, brown Foul  Odor After Cleansing: No Slough/Fibrino Yes Wound Bed Granulation Amount: Large (67-100%) Exposed Structure Granulation Quality: Pink Fascia Exposed: No Necrotic Amount: Small (1-33%) Fat Layer (Subcutaneous Tissue) Exposed: Yes Necrotic Quality: Adherent Slough Tendon Exposed: No Muscle Exposed: No Joint Exposed: No Bone Exposed: No Treatment Notes Wound #1 (Calcaneus) Wound Laterality: Right Cleanser Normal Saline Discharge Instruction: Wash your hands with soap and water. Remove old dressing, discard into  plastic bag and place into trash. Cleanse the wound with Normal Saline prior to applying a clean dressing using gauze sponges, not tissues or cotton balls. Do not scrub or use excessive force. Pat dry using gauze sponges, not tissue or cotton balls. Kendrix, Reinette E. ([id number]) Peri-Wound Care Topical Primary Dressing Iodosorb 40 (g) Discharge Instruction: Apply IodoSorb to wound bed only as directed. Zetuvit Plus Silicone Border Dressing 5x5 (in/in) Discharge Instruction: Secure the collagen Secondary Dressing Secured With Compression Wrap Compression Stockings Add-Ons Electronic Signature(s) Signed: 04/18/2021 4:47:36 PM By: Donnamarie Poag Entered By: Donnamarie Poag on 04/18/2021 15:13:31 Strine, [name] ([id number]) -------------------------------------------------------------------------------- Wound Assessment Details Patient Name: [name] Date of Service: 04/18/2021 2:45 PM Medical Record Number: [id number] Patient Account Number: [id number] Date of Birth/Sex: 1931/04/07 (88 y.o. F) Treating RN: Donnamarie Poag Primary Care Shirely Toren: Kelton Pillar Other Clinician: Referring Vernor Monnig: Kelton Pillar Treating Aretha Levi/Extender: Skipper Cliche in Treatment: 16 Wound Status Wound Number: 3 Primary Etiology: Pressure Ulcer Wound Location: Left Calcaneus Wound Status: Open Wounding Event: Gradually Appeared Comorbid Hypertension, Type II Diabetes,  Osteoarthritis, History: Dementia Date Acquired: 04/10/2021 Weeks Of Treatment: 0 Clustered Wound: No Photos Wound Measurements Length: (cm) 0.8 Width: (cm) 1.5 Depth: (cm) 0.2 Area: (cm) 0.942 Volume: (cm) 0.188 % Reduction in Area: % Reduction in Volume: Tunneling: No Undermining: No Wound Description Classification: Category/Stage III Exudate Amount: Medium Exudate Type: Serosanguineous Exudate Color: red, brown Foul Odor After Cleansing: No Slough/Fibrino Yes Wound Bed Granulation Amount: Medium (34-66%) Exposed Structure Granulation Quality: Red, Pink Fascia Exposed: No Necrotic Amount: Medium (34-66%) Fat Layer (Subcutaneous Tissue) Exposed: Yes Necrotic Quality: Adherent Slough Tendon Exposed: No Muscle Exposed: No Joint Exposed: No Bone Exposed: No Treatment Notes Wound #3 (Calcaneus) Wound Laterality: Left Cleanser Normal Saline Discharge Instruction: Wash your hands with soap and water. Remove old dressing, discard into plastic bag and place into trash. Cleanse the wound with Normal Saline prior to applying a clean dressing using gauze sponges, not tissues or cotton balls. Do not scrub or use excessive force. Pat dry using gauze sponges, not tissue or cotton balls. [place], Adylynn E. ([id number]) Peri-Wound Care Topical Primary Dressing Iodosorb 40 (g) Discharge Instruction: Apply IodoSorb to wound bed only as directed. Zetuvit Plus Silicone Border Dressing 5x5 (in/in) Discharge Instruction: Secure the collagen Secondary Dressing Secured With Compression Wrap Compression Stockings Add-Ons Electronic Signature(s) Signed: 04/18/2021 4:47:36 PM By: Donnamarie Poag Entered By: Donnamarie Poag on 04/18/2021 15:15:52 Ashworth, [name] ([id number]) -------------------------------------------------------------------------------- Wound Assessment Details Patient Name: [name] Date of Service: 04/18/2021 2:45 PM Medical Record Number: [id number] Patient Account Number:  [id number] Date of Birth/Sex: 1931/03/19 (88 y.o. F) Treating RN: Donnamarie Poag Primary Care Kjell Brannen: Kelton Pillar Other Clinician: Referring Jamaya Sleeth: Kelton Pillar Treating Adelyna Brockman/Extender: Skipper Cliche in Treatment: 16 Wound Status Wound Number: 4 Primary Etiology: Pressure Ulcer Wound Location: Left, Medial Malleolus Wound Status: Open Wounding Event: Gradually Appeared Comorbid Hypertension, Type II Diabetes, Osteoarthritis, History: Dementia Date Acquired: 04/10/2021 Weeks Of Treatment: 0 Clustered Wound: No Photos Wound Measurements Length: (cm) 0.5 Width: (cm) 0.7 Depth: (cm) 0.2 Area: (cm) 0.275 Volume: (cm) 0.055 % Reduction in Area: % Reduction in Volume: Tunneling: No Undermining: No Wound Description Classification: Category/Stage III Exudate Amount: Medium Exudate Type: Serosanguineous Exudate Color: red, brown Foul Odor After Cleansing: No Slough/Fibrino Yes Wound Bed Granulation Amount: Medium (34-66%) Exposed Structure Granulation Quality: Red, Pink Fascia Exposed: No Necrotic Amount: Medium (34-66%) Fat Layer (Subcutaneous Tissue) Exposed: Yes Necrotic Quality: Adherent Slough Tendon Exposed: No Muscle  Exposed: No Joint Exposed: No Bone Exposed: No Treatment Notes Wound #4 (Malleolus) Wound Laterality: Left, Medial Cleanser Normal Saline Discharge Instruction: Wash your hands with soap and water. Remove old dressing, discard into plastic bag and place into trash. Cleanse the wound with Normal Saline prior to applying a clean dressing using gauze sponges, not tissues or cotton balls. Do not scrub or use excessive force. Pat dry using gauze sponges, not tissue or cotton balls. Ellerbe, Cyntha E. ([id number]) Peri-Wound Care Topical Primary Dressing Iodosorb 40 (g) Discharge Instruction: Apply IodoSorb to wound bed only as directed. Zetuvit Plus Silicone Border Dressing 5x5 (in/in) Discharge Instruction: Secure the collagen Secondary  Dressing Secured With Compression Wrap Compression Stockings Add-Ons Electronic Signature(s) Signed: 04/18/2021 4:47:36 PM By: Donnamarie Poag Entered By: Donnamarie Poag on 04/18/2021 15:19:02 H[place], [name] ([id number]) -------------------------------------------------------------------------------- [place] Details Patient Name: [name] Date of Service: 04/18/2021 2:45 PM Medical Record Number: [id number] Patient Account Number: [id number] Date of Birth/Sex: [birth year] (88 y.o. F) Treating RN: Donnamarie Poag Primary Care Kadin Bera: Kelton Pillar Other Clinician: Referring Jaymason Ledesma: Kelton Pillar Treating Thecla Forgione/Extender: Skipper Cliche in Treatment: 16 Vital Signs Time Taken: 15:07 Temperature (F): 98.2 Height (in): 63 Pulse (bpm): 76 Weight (lbs): 196 Respiratory Rate (breaths/min): 16 Body Mass Index (BMI): 34.7 Blood Pressure (mmHg): 148/78 Reference Range: 80 - 120 mg / dl Electronic Signature(s) Signed: 04/18/2021 4:47:36 PM By: Donnamarie Poag Entered ByDonnamarie Poag on 04/18/2021 15:09:05

## 2021-04-21 DIAGNOSIS — Z87891 Personal history of nicotine dependence: Secondary | ICD-10-CM | POA: Diagnosis not present

## 2021-04-21 DIAGNOSIS — L89620 Pressure ulcer of left heel, unstageable: Secondary | ICD-10-CM | POA: Diagnosis not present

## 2021-04-21 DIAGNOSIS — I10 Essential (primary) hypertension: Secondary | ICD-10-CM | POA: Diagnosis not present

## 2021-04-21 DIAGNOSIS — N6019 Diffuse cystic mastopathy of unspecified breast: Secondary | ICD-10-CM | POA: Diagnosis not present

## 2021-04-21 DIAGNOSIS — H409 Unspecified glaucoma: Secondary | ICD-10-CM | POA: Diagnosis not present

## 2021-04-21 DIAGNOSIS — E1122 Type 2 diabetes mellitus with diabetic chronic kidney disease: Secondary | ICD-10-CM | POA: Diagnosis not present

## 2021-04-21 DIAGNOSIS — K449 Diaphragmatic hernia without obstruction or gangrene: Secondary | ICD-10-CM | POA: Diagnosis not present

## 2021-04-21 DIAGNOSIS — K2270 Barrett's esophagus without dysplasia: Secondary | ICD-10-CM | POA: Diagnosis not present

## 2021-04-21 DIAGNOSIS — K5790 Diverticulosis of intestine, part unspecified, without perforation or abscess without bleeding: Secondary | ICD-10-CM | POA: Diagnosis not present

## 2021-04-21 DIAGNOSIS — E7800 Pure hypercholesterolemia, unspecified: Secondary | ICD-10-CM | POA: Diagnosis not present

## 2021-04-21 DIAGNOSIS — L89612 Pressure ulcer of right heel, stage 2: Secondary | ICD-10-CM | POA: Diagnosis not present

## 2021-04-21 DIAGNOSIS — Z9181 History of falling: Secondary | ICD-10-CM | POA: Diagnosis not present

## 2021-04-21 DIAGNOSIS — Z7984 Long term (current) use of oral hypoglycemic drugs: Secondary | ICD-10-CM | POA: Diagnosis not present

## 2021-04-21 DIAGNOSIS — E049 Nontoxic goiter, unspecified: Secondary | ICD-10-CM | POA: Diagnosis not present

## 2021-04-21 DIAGNOSIS — Z993 Dependence on wheelchair: Secondary | ICD-10-CM | POA: Diagnosis not present

## 2021-04-21 DIAGNOSIS — D126 Benign neoplasm of colon, unspecified: Secondary | ICD-10-CM | POA: Diagnosis not present

## 2021-04-21 DIAGNOSIS — N189 Chronic kidney disease, unspecified: Secondary | ICD-10-CM | POA: Diagnosis not present

## 2021-04-21 DIAGNOSIS — Z4800 Encounter for change or removal of nonsurgical wound dressing: Secondary | ICD-10-CM | POA: Diagnosis not present

## 2021-04-21 LAB — AEROBIC CULTURE W GRAM STAIN (SUPERFICIAL SPECIMEN): Gram Stain: NONE SEEN

## 2021-04-22 ENCOUNTER — Telehealth (HOSPITAL_COMMUNITY): Payer: Self-pay | Admitting: Licensed Clinical Social Worker

## 2021-04-22 NOTE — Telephone Encounter (Signed)
CSW called pt dtr to check in how transportation went for pt mom last week.  Dtr reports it went very smoothly and she is very appreciative of the assistance.  Also has [facility] for pt appt tomorrow.  Dtr confirms that she received SCAT application- CSW reiterated that they should complete this and turn it in to pt PCP office for completion ASAP as [facility] will likely not be an option once that department disbands.  Dtr expressed understanding and will work on this option.  Will continue to follow and assist as needed  Jorge Ny, [facility] Desk#: [phone] Cell#: [phone]

## 2021-04-23 ENCOUNTER — Ambulatory Visit: Payer: Medicare Other | Admitting: Podiatry

## 2021-04-23 ENCOUNTER — Other Ambulatory Visit: Payer: Self-pay

## 2021-04-23 ENCOUNTER — Encounter: Payer: Self-pay | Admitting: Podiatry

## 2021-04-23 DIAGNOSIS — M79674 Pain in right toe(s): Secondary | ICD-10-CM

## 2021-04-23 DIAGNOSIS — B351 Tinea unguium: Secondary | ICD-10-CM | POA: Diagnosis not present

## 2021-04-23 DIAGNOSIS — M79675 Pain in left toe(s): Secondary | ICD-10-CM

## 2021-04-23 DIAGNOSIS — E119 Type 2 diabetes mellitus without complications: Secondary | ICD-10-CM | POA: Insufficient documentation

## 2021-04-23 NOTE — Progress Notes (Signed)
This patient returns to my office for at risk foot care.  This patient requires this care by a professional since this patient will be at risk due to having diabetes.  She presents to the office with her daughter.  This patient is unable to cut nails herself since the patient cannot reach her nails.These nails are painful walking and wearing shoes. She presents to the office in a wheelchair. This patient presents for at risk foot care today.   General Appearance  Alert, conversant and in no acute stress.  Vascular  Dorsalis pedis and posterior tibial  pulses are palpable  bilaterally.  Capillary return is within normal limits  bilaterally. Temperature is within normal limits  bilaterally.  Neurologic  Senn-Weinstein monofilament wire test within normal limits  bilaterally. Muscle power within normal limits bilaterally.  Nails Thick disfigured discolored nails with subungual debris  from hallux to fifth toes bilaterally. No evidence of bacterial infection or drainage bilaterally.  Orthopedic  No limitations of motion  feet .  No crepitus or effusions noted.  HAV  B/L.  Hammer toes 2-5  B/L.  Skin  normotropic skin with no porokeratosis noted bilaterally.  No signs of infections or ulcers noted.     Onychomycosis  Pain in right toes  Pain in left toes  Consent was obtained for treatment procedures.   Mechanical debridement of nails 1-5  bilaterally performed with a nail nipper.  Filed with dremel without incident.    Return office visit   3 months                   Told patient to return for periodic foot care and evaluation due to potential at risk complications.   Gardiner Barefoot DPM

## 2021-04-24 ENCOUNTER — Telehealth: Payer: Self-pay | Admitting: *Deleted

## 2021-04-24 DIAGNOSIS — E7800 Pure hypercholesterolemia, unspecified: Secondary | ICD-10-CM | POA: Diagnosis not present

## 2021-04-24 DIAGNOSIS — N6019 Diffuse cystic mastopathy of unspecified breast: Secondary | ICD-10-CM | POA: Diagnosis not present

## 2021-04-24 DIAGNOSIS — E1122 Type 2 diabetes mellitus with diabetic chronic kidney disease: Secondary | ICD-10-CM | POA: Diagnosis not present

## 2021-04-24 DIAGNOSIS — K2270 Barrett's esophagus without dysplasia: Secondary | ICD-10-CM | POA: Diagnosis not present

## 2021-04-24 DIAGNOSIS — Z993 Dependence on wheelchair: Secondary | ICD-10-CM | POA: Diagnosis not present

## 2021-04-24 DIAGNOSIS — K5790 Diverticulosis of intestine, part unspecified, without perforation or abscess without bleeding: Secondary | ICD-10-CM | POA: Diagnosis not present

## 2021-04-24 DIAGNOSIS — K449 Diaphragmatic hernia without obstruction or gangrene: Secondary | ICD-10-CM | POA: Diagnosis not present

## 2021-04-24 DIAGNOSIS — Z4800 Encounter for change or removal of nonsurgical wound dressing: Secondary | ICD-10-CM | POA: Diagnosis not present

## 2021-04-24 DIAGNOSIS — E049 Nontoxic goiter, unspecified: Secondary | ICD-10-CM | POA: Diagnosis not present

## 2021-04-24 DIAGNOSIS — I10 Essential (primary) hypertension: Secondary | ICD-10-CM | POA: Diagnosis not present

## 2021-04-24 DIAGNOSIS — L89620 Pressure ulcer of left heel, unstageable: Secondary | ICD-10-CM | POA: Diagnosis not present

## 2021-04-24 DIAGNOSIS — D126 Benign neoplasm of colon, unspecified: Secondary | ICD-10-CM | POA: Diagnosis not present

## 2021-04-24 DIAGNOSIS — Z9181 History of falling: Secondary | ICD-10-CM | POA: Diagnosis not present

## 2021-04-24 DIAGNOSIS — Z7984 Long term (current) use of oral hypoglycemic drugs: Secondary | ICD-10-CM | POA: Diagnosis not present

## 2021-04-24 DIAGNOSIS — H409 Unspecified glaucoma: Secondary | ICD-10-CM | POA: Diagnosis not present

## 2021-04-24 DIAGNOSIS — Z87891 Personal history of nicotine dependence: Secondary | ICD-10-CM | POA: Diagnosis not present

## 2021-04-24 DIAGNOSIS — L89612 Pressure ulcer of right heel, stage 2: Secondary | ICD-10-CM | POA: Diagnosis not present

## 2021-04-24 DIAGNOSIS — N189 Chronic kidney disease, unspecified: Secondary | ICD-10-CM | POA: Diagnosis not present

## 2021-04-26 DIAGNOSIS — N189 Chronic kidney disease, unspecified: Secondary | ICD-10-CM | POA: Diagnosis not present

## 2021-04-26 DIAGNOSIS — E1122 Type 2 diabetes mellitus with diabetic chronic kidney disease: Secondary | ICD-10-CM | POA: Diagnosis not present

## 2021-04-26 DIAGNOSIS — Z7984 Long term (current) use of oral hypoglycemic drugs: Secondary | ICD-10-CM | POA: Diagnosis not present

## 2021-04-26 DIAGNOSIS — Z9181 History of falling: Secondary | ICD-10-CM | POA: Diagnosis not present

## 2021-04-26 DIAGNOSIS — I10 Essential (primary) hypertension: Secondary | ICD-10-CM | POA: Diagnosis not present

## 2021-04-26 DIAGNOSIS — Z4800 Encounter for change or removal of nonsurgical wound dressing: Secondary | ICD-10-CM | POA: Diagnosis not present

## 2021-04-26 DIAGNOSIS — E049 Nontoxic goiter, unspecified: Secondary | ICD-10-CM | POA: Diagnosis not present

## 2021-04-26 DIAGNOSIS — K5790 Diverticulosis of intestine, part unspecified, without perforation or abscess without bleeding: Secondary | ICD-10-CM | POA: Diagnosis not present

## 2021-04-26 DIAGNOSIS — L89612 Pressure ulcer of right heel, stage 2: Secondary | ICD-10-CM | POA: Diagnosis not present

## 2021-04-26 DIAGNOSIS — K2270 Barrett's esophagus without dysplasia: Secondary | ICD-10-CM | POA: Diagnosis not present

## 2021-04-26 DIAGNOSIS — D126 Benign neoplasm of colon, unspecified: Secondary | ICD-10-CM | POA: Diagnosis not present

## 2021-04-26 DIAGNOSIS — L89620 Pressure ulcer of left heel, unstageable: Secondary | ICD-10-CM | POA: Diagnosis not present

## 2021-04-26 DIAGNOSIS — N6019 Diffuse cystic mastopathy of unspecified breast: Secondary | ICD-10-CM | POA: Diagnosis not present

## 2021-04-26 DIAGNOSIS — Z993 Dependence on wheelchair: Secondary | ICD-10-CM | POA: Diagnosis not present

## 2021-04-26 DIAGNOSIS — H409 Unspecified glaucoma: Secondary | ICD-10-CM | POA: Diagnosis not present

## 2021-04-26 DIAGNOSIS — K449 Diaphragmatic hernia without obstruction or gangrene: Secondary | ICD-10-CM | POA: Diagnosis not present

## 2021-04-26 DIAGNOSIS — Z87891 Personal history of nicotine dependence: Secondary | ICD-10-CM | POA: Diagnosis not present

## 2021-04-26 DIAGNOSIS — E7800 Pure hypercholesterolemia, unspecified: Secondary | ICD-10-CM | POA: Diagnosis not present

## 2021-04-29 DIAGNOSIS — Z7984 Long term (current) use of oral hypoglycemic drugs: Secondary | ICD-10-CM | POA: Diagnosis not present

## 2021-04-29 DIAGNOSIS — E1122 Type 2 diabetes mellitus with diabetic chronic kidney disease: Secondary | ICD-10-CM | POA: Diagnosis not present

## 2021-04-29 DIAGNOSIS — L89620 Pressure ulcer of left heel, unstageable: Secondary | ICD-10-CM | POA: Diagnosis not present

## 2021-04-29 DIAGNOSIS — Z9181 History of falling: Secondary | ICD-10-CM | POA: Diagnosis not present

## 2021-04-29 DIAGNOSIS — K5790 Diverticulosis of intestine, part unspecified, without perforation or abscess without bleeding: Secondary | ICD-10-CM | POA: Diagnosis not present

## 2021-04-29 DIAGNOSIS — I10 Essential (primary) hypertension: Secondary | ICD-10-CM | POA: Diagnosis not present

## 2021-04-29 DIAGNOSIS — Z87891 Personal history of nicotine dependence: Secondary | ICD-10-CM | POA: Diagnosis not present

## 2021-04-29 DIAGNOSIS — H409 Unspecified glaucoma: Secondary | ICD-10-CM | POA: Diagnosis not present

## 2021-04-29 DIAGNOSIS — Z4800 Encounter for change or removal of nonsurgical wound dressing: Secondary | ICD-10-CM | POA: Diagnosis not present

## 2021-04-29 DIAGNOSIS — E049 Nontoxic goiter, unspecified: Secondary | ICD-10-CM | POA: Diagnosis not present

## 2021-04-29 DIAGNOSIS — E7800 Pure hypercholesterolemia, unspecified: Secondary | ICD-10-CM | POA: Diagnosis not present

## 2021-04-29 DIAGNOSIS — K2270 Barrett's esophagus without dysplasia: Secondary | ICD-10-CM | POA: Diagnosis not present

## 2021-04-29 DIAGNOSIS — K449 Diaphragmatic hernia without obstruction or gangrene: Secondary | ICD-10-CM | POA: Diagnosis not present

## 2021-04-29 DIAGNOSIS — N189 Chronic kidney disease, unspecified: Secondary | ICD-10-CM | POA: Diagnosis not present

## 2021-04-29 DIAGNOSIS — Z993 Dependence on wheelchair: Secondary | ICD-10-CM | POA: Diagnosis not present

## 2021-04-29 DIAGNOSIS — L89612 Pressure ulcer of right heel, stage 2: Secondary | ICD-10-CM | POA: Diagnosis not present

## 2021-04-29 DIAGNOSIS — D126 Benign neoplasm of colon, unspecified: Secondary | ICD-10-CM | POA: Diagnosis not present

## 2021-04-29 DIAGNOSIS — N6019 Diffuse cystic mastopathy of unspecified breast: Secondary | ICD-10-CM | POA: Diagnosis not present

## 2021-05-02 DIAGNOSIS — L89620 Pressure ulcer of left heel, unstageable: Secondary | ICD-10-CM | POA: Diagnosis not present

## 2021-05-02 DIAGNOSIS — K449 Diaphragmatic hernia without obstruction or gangrene: Secondary | ICD-10-CM | POA: Diagnosis not present

## 2021-05-02 DIAGNOSIS — Z9181 History of falling: Secondary | ICD-10-CM | POA: Diagnosis not present

## 2021-05-02 DIAGNOSIS — D126 Benign neoplasm of colon, unspecified: Secondary | ICD-10-CM | POA: Diagnosis not present

## 2021-05-02 DIAGNOSIS — E049 Nontoxic goiter, unspecified: Secondary | ICD-10-CM | POA: Diagnosis not present

## 2021-05-02 DIAGNOSIS — L89612 Pressure ulcer of right heel, stage 2: Secondary | ICD-10-CM | POA: Diagnosis not present

## 2021-05-02 DIAGNOSIS — N189 Chronic kidney disease, unspecified: Secondary | ICD-10-CM | POA: Diagnosis not present

## 2021-05-02 DIAGNOSIS — I10 Essential (primary) hypertension: Secondary | ICD-10-CM | POA: Diagnosis not present

## 2021-05-02 DIAGNOSIS — Z7984 Long term (current) use of oral hypoglycemic drugs: Secondary | ICD-10-CM | POA: Diagnosis not present

## 2021-05-02 DIAGNOSIS — H409 Unspecified glaucoma: Secondary | ICD-10-CM | POA: Diagnosis not present

## 2021-05-02 DIAGNOSIS — E1122 Type 2 diabetes mellitus with diabetic chronic kidney disease: Secondary | ICD-10-CM | POA: Diagnosis not present

## 2021-05-02 DIAGNOSIS — N6019 Diffuse cystic mastopathy of unspecified breast: Secondary | ICD-10-CM | POA: Diagnosis not present

## 2021-05-02 DIAGNOSIS — Z4800 Encounter for change or removal of nonsurgical wound dressing: Secondary | ICD-10-CM | POA: Diagnosis not present

## 2021-05-02 DIAGNOSIS — Z87891 Personal history of nicotine dependence: Secondary | ICD-10-CM | POA: Diagnosis not present

## 2021-05-02 DIAGNOSIS — K5790 Diverticulosis of intestine, part unspecified, without perforation or abscess without bleeding: Secondary | ICD-10-CM | POA: Diagnosis not present

## 2021-05-02 DIAGNOSIS — E7800 Pure hypercholesterolemia, unspecified: Secondary | ICD-10-CM | POA: Diagnosis not present

## 2021-05-02 DIAGNOSIS — K2270 Barrett's esophagus without dysplasia: Secondary | ICD-10-CM | POA: Diagnosis not present

## 2021-05-02 DIAGNOSIS — Z993 Dependence on wheelchair: Secondary | ICD-10-CM | POA: Diagnosis not present

## 2021-05-03 ENCOUNTER — Encounter: Payer: Medicare Other | Admitting: Physician Assistant

## 2021-05-03 ENCOUNTER — Other Ambulatory Visit: Payer: Self-pay

## 2021-05-03 DIAGNOSIS — E1151 Type 2 diabetes mellitus with diabetic peripheral angiopathy without gangrene: Secondary | ICD-10-CM | POA: Diagnosis not present

## 2021-05-03 DIAGNOSIS — L89623 Pressure ulcer of left heel, stage 3: Secondary | ICD-10-CM | POA: Diagnosis not present

## 2021-05-03 DIAGNOSIS — L89312 Pressure ulcer of right buttock, stage 2: Secondary | ICD-10-CM | POA: Diagnosis not present

## 2021-05-03 DIAGNOSIS — L89322 Pressure ulcer of left buttock, stage 2: Secondary | ICD-10-CM | POA: Diagnosis not present

## 2021-05-03 DIAGNOSIS — I10 Essential (primary) hypertension: Secondary | ICD-10-CM | POA: Diagnosis not present

## 2021-05-03 DIAGNOSIS — S30810A Abrasion of lower back and pelvis, initial encounter: Secondary | ICD-10-CM | POA: Diagnosis not present

## 2021-05-03 DIAGNOSIS — L89613 Pressure ulcer of right heel, stage 3: Secondary | ICD-10-CM | POA: Diagnosis not present

## 2021-05-03 DIAGNOSIS — L89893 Pressure ulcer of other site, stage 3: Secondary | ICD-10-CM | POA: Diagnosis not present

## 2021-05-03 DIAGNOSIS — L97322 Non-pressure chronic ulcer of left ankle with fat layer exposed: Secondary | ICD-10-CM | POA: Diagnosis not present

## 2021-05-03 DIAGNOSIS — Z880 Allergy status to penicillin: Secondary | ICD-10-CM | POA: Diagnosis not present

## 2021-05-03 DIAGNOSIS — L89313 Pressure ulcer of right buttock, stage 3: Secondary | ICD-10-CM | POA: Diagnosis not present

## 2021-05-03 DIAGNOSIS — L89323 Pressure ulcer of left buttock, stage 3: Secondary | ICD-10-CM | POA: Diagnosis not present

## 2021-05-03 DIAGNOSIS — M1990 Unspecified osteoarthritis, unspecified site: Secondary | ICD-10-CM | POA: Diagnosis not present

## 2021-05-03 DIAGNOSIS — E11622 Type 2 diabetes mellitus with other skin ulcer: Secondary | ICD-10-CM | POA: Diagnosis not present

## 2021-05-03 NOTE — Progress Notes (Addendum)
Dettmer, SARAJEAN DESSERT ([id number]) Visit Report for 05/03/2021 Chief Complaint Document Details Patient Name: [name], [name]. Date of Service: 05/03/2021 3:00 PM Medical Record Number: [id number] Patient Account Number: [id number] Date of Birth/Sex: June 10, 1931 (88 y.o. F) Treating RN: Levora Dredge Primary Care Provider: Kelton Pillar Other Clinician: Referring Provider: Kelton Pillar Treating Provider/Extender: Skipper Cliche in Treatment: 18 Information Obtained from: Patient Chief Complaint Right heel pressure, left heel pressure ulcer, left ankle pressure ulcer, and bilateral gluteal pressure ulcers Electronic Signature(s) Signed: 05/03/2021 4:43:08 PM By: Worthy Keeler PA-C Previous Signature: 05/03/2021 3:21:21 PM Version By: Worthy Keeler PA-C Entered By: Worthy Keeler on 05/03/2021 16:43:08 [name], [name] ([id number]) -------------------------------------------------------------------------------- HPI Details Patient Name: [name] Date of Service: 05/03/2021 3:00 PM Medical Record Number: [id number] Patient Account Number: [id number] Date of Birth/Sex: 29-Sep-1931 (88 y.o. F) Treating RN: Levora Dredge Primary Care Provider: Kelton Pillar Other Clinician: Referring Provider: Kelton Pillar Treating Provider/Extender: Skipper Cliche in Treatment: 18 History of Present Illness HPI Description: 12/25/20 upon evaluation today patient presents for a wound on her heel which has been present for about 1 and half months or so according to her daughter. The patient does have dementia. With that being said her daughter is the primary caregiver and seems to be doing an excellent job. She had a fairly significant wound on the heel with eschar covering however this was lifting up and it appears to be mostly healed underneath the biggest issue is on the posterior aspect of the heel where she has a small slit of an opening in the Achilles region which is actually what is mainly  painful for her today. We do not have a hemoglobin A1c as the patient actually is seen by a provider outside of the [facility] system so I am not able to look this up. With that being said I do not see any evidence of active infection and I do think that she is actually doing quite well I think her daughter is doing a great job taking care of the wound in particular. With regard to her past medical history she does have dementia and diabetes mellitus type 2 but otherwise no major medical problems. She did have an x-ray performed 11/27/2020 which was negative for any signs of osteomyelitis. 01/03/2021 upon evaluation today patient appears to be doing well with regard to her heel ulcer. She has been tolerating the dressing changes without complication the collagen seems to be doing a good job for her. I do not see any signs of infection at this point. Unfortunately the patient is stated to have a wound on her bottom although her daughter is not sure how bad this really is next week we will get a get her into a room with a bed so that we can actually check this out I do not want things to get worse unnecessarily. 01/10/2021 upon evaluation today patient's wound actually appears to be doing well in regard to her heel there is some slough noted I Georgina Peer clear this away with some sharp debridement today. With regard to the gluteal region there is some abrasion noted here. This seems to be very superficial and I think it is from her scratching mainly they have been using Desitin I think she could benefit from possibly utilizing some AandE ointment which I think could be of benefit for her. Fortunately there does not appear to be any signs of active infection systemically which is great news. 01/17/2021 upon evaluation today patient's  wound on the gluteal region actually appears to be healed and everything is doing quite well. I am very pleased in that regard. With regard to the wound on her heel that is measuring  smaller and looking much better and very pleased with what I see as well today. 01/25/2021 upon evaluation today patient appears to be doing well in regard to her wounds. She has been actually healing quite nicely in regard to the heel and the gluteal region also seems to be doing quite well. I do not see any evidence of infection which is great. She had several areas of scratching that she had been messing with but again nothing that appears to be open at the this point. 02/15/2021 upon evaluation patient's wounds actually appear to be doing decently well. At this point I am still concerned about the pressure ulcer on her right heel. This does have some eschar overlying I Georgina Peer try to clear this away I am unsure how much is really open underneath though there appears to be some deep tissue injury around as well. Her daughter who generally takes care of her actually unfortunately had a stroke she is with her today but nonetheless is not able to do as much and does not remember as much about what is been going on treatment wise as she otherwise would. 02/22/2021 upon evaluation today patient's wound on the heel actually was doing excellent. Unfortunately she does have a deep tissue injury on the left heel which is not good and not a good sign. This is something her daughter needs to keep a close eye on we do not want this to get worse at all. In fact I am hoping it will not even reopen up completely but again time will tell we never know until it is all said and done. 04/18/2021 upon evaluation today patient unfortunately has not been seen in about 2 months. Nonetheless we have been trying to get her in they have been scheduling but have had issues with transportation that was in the no way related to being their fault. Nonetheless this has still continued to be an ongoing issue. 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 The good news is they have figured out a new way to get here through using the [facility]  health transportation which is good to be definitely helpful for them. With that being said the patient has 2 new wounds on the left heel and left ankle which were not there when I saw her last. The ankle region appears to potentially be infected we will get obtain a wound culture today. 05/03/2021 upon evaluation today patient appears to be doing really not much better in regard to her wounds. With that being said there are dressings that she had and placed today were actually the same dressings that the patient was placed in on February 9 when we last saw her. With that being said we had sent orders to home health to have this changed on a regular basis 3 times a week but apparently according to home health the daughter told them that the dressings we put her on were supposed to stay in place until she came back. Again that obviously was really not what we were looking for at all. Nonetheless I think we need to get this straightened out since apparently the nursing side of this has been discontinued at this point. Electronic Signature(s) Signed: 05/03/2021 4:43:17 PM By: Worthy Keeler PA-C Entered By: Worthy Keeler on 05/03/2021 16:43:17 Staggs,  TAHIRY SPICER ([id number]) -------------------------------------------------------------------------------- Physical Exam Details Patient Name: Magallon, [name]. Date of Service: 05/03/2021 3:00 PM Medical Record Number: [id number] Patient Account Number: [id number] Date of Birth/Sex: 1931/08/22 (88 y.o. F) Treating RN: Levora Dredge Primary Care Provider: Kelton Pillar Other Clinician: Referring Provider: Kelton Pillar Treating Provider/Extender: Skipper Cliche in Treatment: 18 Constitutional Well-nourished and well-hydrated in no acute distress. Respiratory normal breathing without difficulty. Psychiatric this patient is able to make decisions and demonstrates good insight into disease process. Alert and Oriented x 3. pleasant and  cooperative. Notes Upon inspection patient's wounds currently are showing signs of really being about the same in regard to her heels unfortunately the gluteal region is not doing nearly as well. This is broken down more with multiple stage II pressure ulcers noted at this point. Subsequently I think that the patient would qualify for an air mattress and I would like to try to see about getting this for her as soon as possible she has been using foam overlay's with unfortunately not good result at this point. Electronic Signature(s) Signed: 05/03/2021 4:43:49 PM By: Worthy Keeler PA-C Entered By: Worthy Keeler on 05/03/2021 16:43:49 [name], [name] ([id number]) -------------------------------------------------------------------------------- Physician Orders Details Patient Name: [name] Date of Service: 05/03/2021 3:00 PM Medical Record Number: [id number] Patient Account Number: [id number] Date of Birth/Sex: [birth year] (88 y.o. F) Treating RN: Donnamarie Poag Primary Care Provider: Kelton Pillar Other Clinician: Referring Provider: Kelton Pillar Treating Provider/Extender: Skipper Cliche in Treatment: 18 Verbal / Phone Orders: No Diagnosis Coding ICD-10 Coding Code Description L89.613 Pressure ulcer of right heel, stage 3 L89.623 Pressure ulcer of left heel, stage 3 L89.893 Pressure ulcer of other site, stage 3 S30.810A Abrasion of lower back and pelvis, initial encounter E11.622 Type 2 diabetes mellitus with other skin ulcer F01.50 Vascular dementia without behavioral disturbance Follow-up Appointments o Return Appointment in 2 weeks. [place] [phone] o ADMIT to [place] for wound care. May utilize formulary equivalent dressing for wound treatment orders unless otherwise specified. Home Health Nurse may visit PRN to address patientos wound care needs. - Readmit for nursing for wound care o Scheduled  days for dressing changes to be completed; exception, patient has scheduled wound care visit that day. - 3 x week o **Please direct any NON-WOUND related issues/requests for orders to patient's Primary Care Physician. **If current dressing causes regression in wound condition, may D/C ordered dressing product/s and apply Normal Saline Moist Dressing daily until next [place] or Other MD appointment. **Notify [facility] of regression in wound condition at [phone]. Bathing/ Shower/ Hygiene o May shower; gently cleanse wound with antibacterial soap, rinse and pat dry prior to dressing wounds Anesthetic (Use 'Patient Medications' Section for Anesthetic Order Entry) o Lidocaine applied to wound bed Non-Wound Condition o Additional non-wound orders/instructions: - skin protective wipe to any DTI Off-Loading o Gel wheelchair cushion - Recommended o Low air-loss mattress (Group 2) o Turn and reposition every 2 hours - reposition every 2 hrs to prevent skin breakdown o Other: - Patient to purchase slippers with rubber soles. Education provided on measures to decrease scratching. Keeping heels protected. Recommend Prafo boot in bed available on the internet Additional Orders / Instructions o Follow Nutritious Diet and Increase Protein Intake Wound Treatment Wound #1 - Calcaneus Wound Laterality: Right Cleanser: Normal Saline 3 x Per Week/30 Days Discharge Instructions: Wash your hands with soap and water. Remove old dressing,  discard into plastic bag and place into trash. Cleanse the wound with Normal Saline prior to applying a clean dressing using gauze sponges, not tissues or cotton balls. Do not scrub or use excessive force. Pat dry using gauze sponges, not tissue or cotton balls. Primary Dressing: Iodosorb 40 (g) 3 x Per Week/30 Days Discharge Instructions: Apply IodoSorb to wound bed only as directed. Primary Dressing: Zetuvit Plus Silicone Border  Dressing 5x5 (in/in) (Generic) 3 x Per Week/30 Days Discharge Instructions: Secure the collagen Follette, Dezerae E. ([id number]) Wound #3 - Calcaneus Wound Laterality: Left Cleanser: Normal Saline 3 x Per Week/30 Days Discharge Instructions: Wash your hands with soap and water. Remove old dressing, discard into plastic bag and place into trash. Cleanse the wound with Normal Saline prior to applying a clean dressing using gauze sponges, not tissues or cotton balls. Do not scrub or use excessive force. Pat dry using gauze sponges, not tissue or cotton balls. Primary Dressing: Iodosorb 40 (g) 3 x Per Week/30 Days Discharge Instructions: Apply IodoSorb to wound bed only as directed. Primary Dressing: Zetuvit Plus Silicone Border Dressing 5x5 (in/in) (Generic) 3 x Per Week/30 Days Discharge Instructions: Secure the collagen Wound #4 - Malleolus Wound Laterality: Left, Medial Cleanser: Normal Saline 3 x Per Week/30 Days Discharge Instructions: Wash your hands with soap and water. Remove old dressing, discard into plastic bag and place into trash. Cleanse the wound with Normal Saline prior to applying a clean dressing using gauze sponges, not tissues or cotton balls. Do not scrub or use excessive force. Pat dry using gauze sponges, not tissue or cotton balls. Primary Dressing: Iodosorb 40 (g) 3 x Per Week/30 Days Discharge Instructions: Apply IodoSorb to wound bed only as directed. Primary Dressing: Zetuvit Plus Silicone Border Dressing 5x5 (in/in) (Generic) 3 x Per Week/30 Days Discharge Instructions: Secure the collagen Wound #5 - Calcaneus Wound Laterality: Right, Medial Cleanser: Normal Saline 3 x Per Week/30 Days Discharge Instructions: Wash your hands with soap and water. Remove old dressing, discard into plastic bag and place into trash. Cleanse the wound with Normal Saline prior to applying a clean dressing using gauze sponges, not tissues or cotton balls. Do not scrub or use excessive force. Pat  dry using gauze sponges, not tissue or cotton balls. Topical: Betadine 3 x Per Week/30 Days Discharge Instructions: Apply betadine as directed. Wound #6 - Gluteal fold Wound Laterality: Midline Cleanser: Normal Saline 3 x Per Week/30 Days Discharge Instructions: Wash your hands with soap and water. Remove old dressing, discard into plastic bag and place into trash. Cleanse the wound with Normal Saline prior to applying a clean dressing using gauze sponges, not tissues or cotton balls. Do not scrub or use excessive force. Pat dry using gauze sponges, not tissue or cotton balls. Cleanser: Wound Cleanser 3 x Per Week/30 Days Discharge Instructions: Wash your hands with soap and water. Remove old dressing, discard into plastic bag and place into trash. Cleanse the wound with Wound Cleanser prior to applying a clean dressing using gauze sponges, not tissues or cotton balls. Do not scrub or use excessive force. Pat dry using gauze sponges, not tissue or cotton balls. Primary Dressing: Silvercel 4 1/4x 4 1/4 (in/in) 3 x Per Week/30 Days Discharge Instructions: Apply Silvercel 4 1/4x 4 1/4 (in/in) as instructed Secondary Dressing: Zetuvit Plus Silicone Non-bordered 5x5 (in/in) 3 x Per Week/30 Days Wound #7 - Gluteus Wound Laterality: Left Cleanser: Normal Saline 3 x Per Week/30 Days Discharge Instructions: Wash your hands with soap and  water. Remove old dressing, discard into plastic bag and place into trash. Cleanse the wound with Normal Saline prior to applying a clean dressing using gauze sponges, not tissues or cotton balls. Do not scrub or use excessive force. Pat dry using gauze sponges, not tissue or cotton balls. Cleanser: Wound Cleanser 3 x Per Week/30 Days Discharge Instructions: Wash your hands with soap and water. Remove old dressing, discard into plastic bag and place into trash. Cleanse the wound with Wound Cleanser prior to applying a clean dressing using gauze sponges, not tissues or  cotton balls. Do not scrub or use excessive force. Pat dry using gauze sponges, not tissue or cotton balls. Primary Dressing: Silvercel 4 1/4x 4 1/4 (in/in) 3 x Per Week/30 Days Discharge Instructions: Apply Silvercel 4 1/4x 4 1/4 (in/in) as instructed Secondary Dressing: Zetuvit Plus Silicone Non-bordered 5x5 (in/in) 3 x Per Week/30 Days [name], [name] ([id number]) Patient Medications Allergies: penicillin, shrimp, ACE Inhibitors Notifications Medication Indication Start End CUSTOM MED: Power Pressure Reducing Air for multiple stage 3 05/06/2021 Mattress pressure injuries DOSE Group II Support Surface Electronic Signature(s) Signed: 05/06/2021 4:22:58 PM By: Donnamarie Poag Signed: 05/06/2021 5:31:39 PM By: Worthy Keeler PA-C Previous Signature: 05/03/2021 4:55:34 PM Version By: Worthy Keeler PA-C Previous Signature: 05/03/2021 4:55:51 PM Version By: Donnamarie Poag Previous Signature: 05/03/2021 4:25:36 PM Version By: Donnamarie Poag Entered By: Donnamarie Poag on 05/06/2021 12:07:27 [name], [name] ([id number]) -------------------------------------------------------------------------------- Prescription 05/03/2021 Patient Name: Berrett, [name] Provider: Jeri Cos PA-C Date of Birth: 1931-09-28 NPI#: [id number] Sex: F DEA#: [id number] Phone #: [phone] License #: Patient Address: [facility] [place], [place] [address] [address] [place], [place] [address] [phone] Allergies penicillin; shrimp; ACE Inhibitors Medication Medication: Route: Strength: Form: Power Pressure Reducing Air Mattress Class: Dose: Frequency / Time: Indication: Group II Support Surface for multiple stage 3 pressure injuries Number of Refills: Number of Units: 0 One (1) Generic Substitution: Start Date: End Date: Administered at Facility: Substitution Permitted 10/26/2991 No Note to Pharmacy: Hand  Signature: Date(s): Electronic Signature(s) Signed: 05/06/2021 4:22:58 PM By: Donnamarie Poag Signed: 05/06/2021 5:31:39 PM By: Worthy Keeler PA-C Entered By: Donnamarie Poag on 05/06/2021 12:07:28 [name], [name] ([id number]) --------------------------------------------------------------------------------  Problem List Details Patient Name: [name] Date of Service: 05/03/2021 3:00 PM Medical Record Number: [id number] Patient Account Number: [id number] Date of Birth/Sex: 1932-02-28 (88 y.o. F) Treating RN: Levora Dredge Primary Care Provider: Kelton Pillar Other Clinician: Referring Provider: Kelton Pillar Treating Provider/Extender: Skipper Cliche in Treatment: 18 Active Problems ICD-10 Encounter Code Description Active Date MDM Diagnosis L89.613 Pressure ulcer of right heel, stage 3 12/25/2020 No Yes L89.623 Pressure ulcer of left heel, stage 3 04/18/2021 No Yes L89.893 Pressure ulcer of other site, stage 3 04/18/2021 No Yes L89.322 Pressure ulcer of left buttock, stage 2 05/03/2021 No Yes L89.312 Pressure ulcer of right buttock, stage 2 05/03/2021 No Yes E11.622 Type 2 diabetes mellitus with other skin ulcer 12/25/2020 No Yes F01.50 Vascular dementia without behavioral disturbance 12/25/2020 No Yes Inactive Problems Resolved Problems ICD-10 Code Description Active Date Resolved Date S30.810A Abrasion of lower back and pelvis, initial encounter 01/10/2021 01/10/2021 Electronic Signature(s) Signed: 05/03/2021 4:42:39 PM By: Worthy Keeler PA-C Previous Signature: 05/03/2021 3:21:03 PM Version By: Worthy Keeler PA-C Entered By: Worthy Keeler on 05/03/2021 16:42:39 [name], [name] ([id number]) -------------------------------------------------------------------------------- Progress Note Details Patient Name: [name], [name]. Date of Service: 05/03/2021 3:00  PM Medical Record Number: [id number] Patient Account Number: [id number] Date of Birth/Sex: 11-14-1931 (88 y.o. F) Treating RN:  Levora Dredge Primary Care Provider: Kelton Pillar Other Clinician: Referring Provider: Kelton Pillar Treating Provider/Extender: Skipper Cliche in Treatment: 18 Subjective Chief Complaint Information obtained from Patient Right heel pressure, left heel pressure ulcer, left ankle pressure ulcer, and bilateral gluteal pressure ulcers History of Present Illness (HPI) 12/25/20 upon evaluation today patient presents for a wound on her heel which has been present for about 1 and half months or so according to her daughter. The patient does have dementia. With that being said her daughter is the primary caregiver and seems to be doing an excellent job. She had a fairly significant wound on the heel with eschar covering however this was lifting up and it appears to be mostly healed underneath the biggest issue is on the posterior aspect of the heel where she has a small slit of an opening in the Achilles region which is actually what is mainly painful for her today. We do not have a hemoglobin A1c as the patient actually is seen by a provider outside of the [facility] system so I am not able to look this up. With that being said I do not see any evidence of active infection and I do think that she is actually doing quite well I think her daughter is doing a great job taking care of the wound in particular. With regard to her past medical history she does have dementia and diabetes mellitus type 2 but otherwise no major medical problems. She did have an x-ray performed 11/27/2020 which was negative for any signs of osteomyelitis. 01/03/2021 upon evaluation today patient appears to be doing well with regard to her heel ulcer. She has been tolerating the dressing changes without complication the collagen seems to be doing a good job for her. I do not see any signs of infection at this point. Unfortunately the patient is stated to have a wound on her bottom although her daughter is not sure how bad this  really is next week we will get a get her into a room with a bed so that we can actually check this out I do not want things to get worse unnecessarily. 01/10/2021 upon evaluation today patient's wound actually appears to be doing well in regard to her heel there is some slough noted I Georgina Peer clear this away with some sharp debridement today. With regard to the gluteal region there is some abrasion noted here. This seems to be very superficial and I think it is from her scratching mainly they have been using Desitin I think she could benefit from possibly utilizing some AandE ointment which I think could be of benefit for her. Fortunately there does not appear to be any signs of active infection systemically which is great news. 01/17/2021 upon evaluation today patient's wound on the gluteal region actually appears to be healed and everything is doing quite well. I am very pleased in that regard. With regard to the wound on her heel that is measuring smaller and looking much better and very pleased with what I see as well today. 01/25/2021 upon evaluation today patient appears to be doing well in regard to her wounds. She has been actually healing quite nicely in regard to the heel and the gluteal region also seems to be doing quite well. I do not see any evidence of infection which is great. She had several areas of scratching that she had  been messing with but again nothing that appears to be open at the this point. 02/15/2021 upon evaluation patient's wounds actually appear to be doing decently well. At this point I am still concerned about the pressure ulcer on her right heel. This does have some eschar overlying I Georgina Peer try to clear this away I am unsure how much is really open underneath though there appears to be some deep tissue injury around as well. Her daughter who generally takes care of her actually unfortunately had a stroke she is with her today but nonetheless is not able to do as much  and does not remember as much about what is been going on treatment wise as she otherwise would. 02/22/2021 upon evaluation today patient's wound on the heel actually was doing excellent. Unfortunately she does have a deep tissue injury on the left heel which is not good and not a good sign. This is something her daughter needs to keep a close eye on we do not want this to get worse at all. In fact I am hoping it will not even reopen up completely but again time will tell we never know until it is all said and done. 04/18/2021 upon evaluation today patient unfortunately has not been seen in about 2 months. Nonetheless we have been trying to get her in they have been scheduling but have had issues with transportation that was in the no way related to being their fault. Nonetheless this has still continued to be an ongoing issue. 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 With that being said the patient has 2 new wounds on the left heel and left ankle which were not there when I saw her last. The ankle region appears to potentially be infected we will get obtain a wound culture today. 05/03/2021 upon evaluation today patient appears to be doing really not much better in regard to her wounds. With that being said there are dressings that she had and placed today were actually the same dressings that the patient was placed in on February 9 when we last saw her. With that being said we had sent orders to home health to have this changed on a regular basis 3 times a week but apparently according to home health the daughter told them that the dressings we put her on were supposed to stay in place until she came back. Again that obviously was really not what we were looking for at all. Nonetheless I think we need to get this straightened out since apparently the  nursing side of this has been discontinued at this point. [name], [name] ([id number]) Objective Constitutional Well-nourished and well-hydrated in no acute distress. Vitals Time Taken: 3:25 PM, Height: 63 in, Weight: 196 lbs, BMI: 34.7, Temperature: 98 F, Pulse: 70 bpm, Respiratory Rate: 18 breaths/min, Blood Pressure: 147/73 mmHg. Respiratory normal breathing without difficulty. Psychiatric this patient is able to make decisions and demonstrates good insight into disease process. Alert and Oriented x 3. pleasant and cooperative. General Notes: Upon inspection patient's wounds currently are showing signs of really being about the same in regard to her heels unfortunately the gluteal region is not doing nearly as well. This is broken down more with multiple stage II pressure ulcers noted at this point. Subsequently I think that the patient would qualify for an air mattress and  I would like to try to see about getting this for her as soon as possible she has been using foam overlay's with unfortunately not good result at this point. Integumentary (Hair, Skin) Wound #1 status is Open. Original cause of wound was Pressure Injury. The date acquired was: 12/25/2020. The wound has been in treatment 18 weeks. The wound is located on the Right Calcaneus. The wound measures 1.2cm length x 1.7cm width x 0.1cm depth; 1.602cm^2 area and 0.16cm^3 volume. There is Fat Layer (Subcutaneous Tissue) exposed. There is no tunneling or undermining noted. There is a medium amount of serosanguineous drainage noted. There is large (67-100%) pink granulation within the wound bed. There is a small (1-33%) amount of necrotic tissue within the wound bed including Adherent Slough. Wound #3 status is Open. Original cause of wound was Gradually Appeared. The date acquired was: 04/10/2021. The wound has been in treatment 2 weeks. The wound is located on the Left Calcaneus. The wound measures 0.7cm length x 1.5cm width x 0.1cm  depth; 0.825cm^2 area and 0.082cm^3 volume. There is Fat Layer (Subcutaneous Tissue) exposed. There is no tunneling or undermining noted. There is a medium amount of serosanguineous drainage noted. There is no granulation within the wound bed. There is a large (67-100%) amount of necrotic tissue within the wound bed including Adherent Slough. Wound #4 status is Open. Original cause of wound was Gradually Appeared. The date acquired was: 04/10/2021. The wound has been in treatment 2 weeks. The wound is located on the Left,Medial Malleolus. The wound measures 0.5cm length x 1cm width x 0.2cm depth; 0.393cm^2 area and 0.079cm^3 volume. There is Fat Layer (Subcutaneous Tissue) exposed. There is no tunneling or undermining noted. There is a medium amount of serosanguineous drainage noted. There is small (1-33%) red, pink granulation within the wound bed. There is a large (67-100%) amount of necrotic tissue within the wound bed including Adherent Slough. Wound #5 status is Open. Original cause of wound was Pressure Injury. The date acquired was: 05/03/2021. The wound is located on the Right,Medial Calcaneus. The wound measures 2.3cm length x 3.5cm width x 0.1cm depth; 6.322cm^2 area and 0.632cm^3 volume. There is no tunneling or undermining noted. There is a medium amount of serosanguineous drainage noted. There is no granulation within the wound bed. There is a large (67-100%) amount of necrotic tissue within the wound bed including Adherent Slough. Wound #6 status is Open. Original cause of wound was Pressure Injury. The date acquired was: 05/03/2021. The wound is located on the Midline Gluteal fold. The wound measures 1cm length x 0.5cm width x 0.1cm depth; 0.393cm^2 area and 0.039cm^3 volume. There is Fat Layer (Subcutaneous Tissue) exposed. There is no tunneling or undermining noted. There is a medium amount of serosanguineous drainage noted. There is medium (34-66%) pink, pale granulation within the  wound bed. There is a medium (34-66%) amount of necrotic tissue within the wound bed including Adherent Slough. Wound #7 status is Open. Original cause of wound was Pressure Injury. The date acquired was: 05/03/2021. The wound is located on the Left Gluteus. The wound measures 3cm length x 1cm width x 0.01cm depth; 2.356cm^2 area and 0.024cm^3 volume. There is Fat Layer (Subcutaneous Tissue) exposed. There is no tunneling or undermining noted. There is a medium amount of serosanguineous drainage noted. There is small (1-33%) pink granulation within the wound bed. There is a large (67-100%) amount of necrotic tissue within the wound bed including Adherent Slough. Assessment Active Problems ICD-10 Pressure ulcer of right heel,  stage 3 Pressure ulcer of left heel, stage 3 Pressure ulcer of other site, stage 3 Pressure ulcer of left buttock, stage 2 Pressure ulcer of right buttock, stage 2 Type 2 diabetes mellitus with other skin ulcer Vascular dementia without behavioral disturbance [name], Leni E. ([id number]) Plan Follow-up Appointments: Return Appointment in 2 weeks. Home Health: [facility] [phone] ADMIT to [place] for wound care. May utilize formulary equivalent dressing for wound treatment orders unless otherwise specified. Home Health Nurse may visit PR[facility] to address patient s wound care needs. - Readmit for nursing for wound care Scheduled days for dressing changes to be completed; exception, patient has scheduled wound care visit that day. - 3 x week **Please direct any NON-WOUND related issues/requests for orders to patient's Primary Care Physician. **If current dressing causes regression in wound condition, may D/C ordered dressing product/s and apply Normal Saline Moist Dressing daily until next [place] or Other MD appointment. **Notify [facility] of regression in wound condition at [phone]. Bathing/  Shower/ Hygiene: May shower; gently cleanse wound with antibacterial soap, rinse and pat dry prior to dressing wounds Anesthetic (Use 'Patient Medications' Section for Anesthetic Order Entry): Lidocaine applied to wound bed Non-Wound Condition: Additional non-wound orders/instructions: - skin protective wipe to any DTI Off-Loading: Gel wheelchair cushion - Recommended Turn and reposition every 2 hours - reposition every 2 hrs to prevent skin breakdown Other: - Patient to purchase slippers with rubber soles. Education provided on measures to decrease scratching. Keeping heels protected. Recommend Prafo boot in bed available on the internet Additional Orders / Instructions: Follow Nutritious Diet and Increase Protein Intake WOUND #1: - Calcaneus Wound Laterality: Right Cleanser: Normal Saline 3 x Per Week/30 Days Discharge Instructions: Wash your hands with soap and water. Remove old dressing, discard into plastic bag and place into trash. Cleanse the wound with Normal Saline prior to applying a clean dressing using gauze sponges, not tissues or cotton balls. Do not scrub or use excessive force. Pat dry using gauze sponges, not tissue or cotton balls. Primary Dressing: Iodosorb 40 (g) 3 x Per Week/30 Days Discharge Instructions: Apply IodoSorb to wound bed only as directed. Primary Dressing: Zetuvit Plus Silicone Border Dressing 5x5 (in/in) (Generic) 3 x Per Week/30 Days Discharge Instructions: Secure the collagen WOUND #3: - Calcaneus Wound Laterality: Left Cleanser: Normal Saline 3 x Per Week/30 Days Discharge Instructions: Wash your hands with soap and water. Remove old dressing, discard into plastic bag and place into trash. Cleanse the wound with Normal Saline prior to applying a clean dressing using gauze sponges, not tissues or cotton balls. Do not scrub or use excessive force. Pat dry using gauze sponges, not tissue or cotton balls. Primary Dressing: Iodosorb 40 (g) 3 x Per Week/30  Days Discharge Instructions: Apply IodoSorb to wound bed only as directed. Primary Dressing: Zetuvit Plus Silicone Border Dressing 5x5 (in/in) (Generic) 3 x Per Week/30 Days Discharge Instructions: Secure the collagen WOUND #4: - Malleolus Wound Laterality: Left, Medial Cleanser: Normal Saline 3 x Per Week/30 Days Discharge Instructions: Wash your hands with soap and water. Remove old dressing, discard into plastic bag and place into trash. Cleanse the wound with Normal Saline prior to applying a clean dressing using gauze sponges, not tissues or cotton balls. Do not scrub or use excessive force. Pat dry using gauze sponges, not tissue or cotton balls. Primary Dressing: Iodosorb 40 (g) 3 x Per Week/30 Days Discharge Instructions: Apply IodoSorb to wound  bed only as directed. Primary Dressing: Zetuvit Plus Silicone Border Dressing 5x5 (in/in) (Generic) 3 x Per Week/30 Days Discharge Instructions: Secure the collagen WOUND #5: - Calcaneus Wound Laterality: Right, Medial Cleanser: Normal Saline 3 x Per Week/30 Days Discharge Instructions: Wash your hands with soap and water. Remove old dressing, discard into plastic bag and place into trash. Cleanse the wound with Normal Saline prior to applying a clean dressing using gauze sponges, not tissues or cotton balls. Do not scrub or use excessive force. Pat dry using gauze sponges, not tissue or cotton balls. Topical: Betadine 3 x Per Week/30 Days Discharge Instructions: Apply betadine as directed. WOUND #6: - Gluteal fold Wound Laterality: Midline Cleanser: Normal Saline 3 x Per Week/30 Days Discharge Instructions: Wash your hands with soap and water. Remove old dressing, discard into plastic bag and place into trash. Cleanse the wound with Normal Saline prior to applying a clean dressing using gauze sponges, not tissues or cotton balls. Do not scrub or use excessive force. Pat dry using gauze sponges, not tissue or cotton balls. Cleanser: Wound  Cleanser 3 x Per Week/30 Days Discharge Instructions: Wash your hands with soap and water. Remove old dressing, discard into plastic bag and place into trash. Cleanse the wound with Wound Cleanser prior to applying a clean dressing using gauze sponges, not tissues or cotton balls. Do not scrub or use excessive force. Pat dry using gauze sponges, not tissue or cotton balls. Primary Dressing: Silvercel 4 1/4x 4 1/4 (in/in) 3 x Per Week/30 Days Discharge Instructions: Apply Silvercel 4 1/4x 4 1/4 (in/in) as instructed Secondary Dressing: Zetuvit Plus Silicone Non-bordered 5x5 (in/in) 3 x Per Week/30 Days WOUND #7: - Gluteus Wound Laterality: Left Cleanser: Normal Saline 3 x Per Week/30 Days Discharge Instructions: Wash your hands with soap and water. Remove old dressing, discard into plastic bag and place into trash. Cleanse the wound with Normal Saline prior to applying a clean dressing using gauze sponges, not tissues or cotton balls. Do not scrub or use Braley, Ermalee E. ([id number]) excessive force. Pat dry using gauze sponges, not tissue or cotton balls. Cleanser: Wound Cleanser 3 x Per Week/30 Days Discharge Instructions: Wash your hands with soap and water. Remove old dressing, discard into plastic bag and place into trash. Cleanse the wound with Wound Cleanser prior to applying a clean dressing using gauze sponges, not tissues or cotton balls. Do not scrub or use excessive force. Pat dry using gauze sponges, not tissue or cotton balls. Primary Dressing: Silvercel 4 1/4x 4 1/4 (in/in) 3 x Per Week/30 Days Discharge Instructions: Apply Silvercel 4 1/4x 4 1/4 (in/in) as instructed Secondary Dressing: Zetuvit Plus Silicone Non-bordered 5x5 (in/in) 3 x Per Week/30 Days 1. Based on the fact that she has been using the eggcrate foam overlay's without help and with the pressure and now has stage II pressure ulcers multiple locations in the gluteal region I [place] see about ordering her a low-air-loss  mattress which is alternating to try to see if we can prevent some of this pressure breakdown. 2. I am also can recommend that we going continue with the Iodosorb to the wounds on the feet in general. We will use on the deep tissue injury on the heel Betadine only. 3. With regard to the gluteal region regular using a silver alginate dressing which I think is good to be the best way to go. We will see patient back for reevaluation in 2 weeks here in the clinic. If anything worsens or  changes patient will contact our office for additional recommendations. Electronic Signature(s) Signed: 05/03/2021 4:44:36 PM By: Worthy Keeler PA-C Entered By: Worthy Keeler on 05/03/2021 16:44:35 Mingle, [name] ([id number]) -------------------------------------------------------------------------------- SuperBill Details Patient Name: [name] Date of Service: 05/03/2021 Medical Record Number: [id number] Patient Account Number: [id number] Date of Birth/Sex: [birth year] (88 y.o. F) Treating RN: Levora Dredge Primary Care Provider: Kelton Pillar Other Clinician: Referring Provider: Kelton Pillar Treating Provider/Extender: Skipper Cliche in Treatment: 18 Diagnosis Coding ICD-10 Codes Code Description [phone] Pressure ulcer of right heel, stage 3 L89.623 Pressure ulcer of left heel, stage 3 L89.893 Pressure ulcer of other site, stage 3 L89.322 Pressure ulcer of left buttock, stage 2 L89.312 Pressure ulcer of right buttock, stage 2 E11.622 Type 2 diabetes mellitus with other skin ulcer Facility Procedures CPT4 Code: [id number] Description: 09470 - WOUND CARE VISIT-LEV 5 NEW PT Modifier: Quantity: 1 Physician Procedures CPT4 Code: [id number] Description: 29476 - WC PHYS LEVEL 4 - EST PT Modifier: Quantity: 1 CPT4 Code: Description: ICD-10 Diagnosis Description L89.613 Pressure ulcer of right heel, stage 3 L89.623 Pressure ulcer of left heel, stage 3 L89.322 Pressure ulcer of left buttock, stage  2 L89.312 Pressure ulcer of right buttock, stage 2 Modifier: Quantity: Electronic Signature(s) Signed: 05/03/2021 4:53:06 PM By: Levora Dredge Signed: 05/03/2021 4:55:34 PM By: Worthy Keeler PA-C Previous Signature: 05/03/2021 4:45:04 PM Version By: Worthy Keeler PA-C Entered By: Levora Dredge on 05/03/2021 16:53:05

## 2021-05-03 NOTE — Progress Notes (Addendum)
[name], Kimberly SHIROMA ([id number]) Visit Report for 05/03/2021 Arrival Information Details Patient Name: [name], Kimberly GUTIERREZ Date of Service: 05/03/2021 3:00 PM Medical Record Number: [id number] Patient Account Number: [id number] Date of Birth/Sex: 02-18-1932 (88 y.o. F) Treating RN: Levora Dredge Primary Care Lashaye Fisk: Kelton Pillar Other Clinician: Referring Cordera Stineman: Kelton Pillar Treating Lamija Besse/Extender: Skipper Cliche in Treatment: 18 Visit Information History Since Last Visit Added or deleted any medications: No Patient Arrived: Wheel Chair Any new allergies or adverse reactions: No Arrival Time: 15:28 Had a fall or experienced change in No Accompanied By: daughter activities of daily living that may affect Transfer Assistance: EasyPivot Patient Lift risk of falls: Patient Identification Verified: Yes Hospitalized since last visit: No Secondary Verification Process Yes Has Dressing in Place as Prescribed: Yes Completed: Pain Present Now: Yes Patient Has Alerts: Yes Patient Alerts: Type II Diabetic HOYER/put in chair [place] Signature(s) Signed: 05/03/2021 4:19:18 PM By: Donnamarie Poag Entered By: Donnamarie Poag on 05/03/2021 16:19:18 [name], [name] ([id number]) -------------------------------------------------------------------------------- Clinic Level of Care Assessment Details Patient Name: Bang, [name] Date of Service: 05/03/2021 3:00 PM Medical Record Number: [id number] Patient Account Number: [id number] Date of Birth/Sex: Jul 17, 1931 (88 y.o. F) Treating RN: Levora Dredge Primary Care Dreden Rivere: Kelton Pillar Other Clinician: Referring Salinda Snedeker: Kelton Pillar Treating Terrilee Dudzik/Extender: Skipper Cliche in Treatment: 18 Clinic Level of Care Assessment Items TOOL 4 Quantity Score [] - Use when only an EandM is performed on FOLLOW-UP visit 0 ASSESSMENTS - Nursing Assessment / Reassessment X - Reassessment of Co-morbidities (includes updates in  patient status) 1 10 [] - 0 Reassessment of Adherence to Treatment Plan ASSESSMENTS - Wound and Skin Assessment / Reassessment [] - Simple Wound Assessment / Reassessment - one wound 0 X- 6 5 Complex Wound Assessment / Reassessment - multiple wounds [] - 0 Dermatologic / Skin Assessment (not related to wound area) ASSESSMENTS - Focused Assessment [] - Circumferential Edema Measurements - multi extremities 0 [] - 0 Nutritional Assessment / Counseling / Intervention [] - 0 Lower Extremity Assessment (monofilament, tuning fork, pulses) [] - 0 Peripheral Arterial Disease Assessment (using hand held doppler) ASSESSMENTS - Ostomy and/or Continence Assessment and Care [] - Incontinence Assessment and Management 0 [] - 0 Ostomy Care Assessment and Management (repouching, etc.) PROCESS - Coordination of Care [] - Simple Patient / Family Education for ongoing care 0 X- 1 20 Complex (extensive) Patient / Family Education for ongoing care [] - 0 Staff obtains Programmer, systems, Records, Test Results / Process Orders [] - 0 Staff telephones HHA, Nursing Homes / Clarify orders / etc [] - 0 Routine Transfer to another Facility (non-emergent condition) [] - 0 Routine Hospital Admission (non-emergent condition) [] - 0 New Admissions / Biomedical engineer / Ordering NPWT, Apligraf, etc. [] - 0 Emergency Hospital Admission (emergent condition) [] - 0 Simple Discharge Coordination X- 1 15 Complex (extensive) Discharge Coordination PROCESS - Special Needs [] - Pediatric / Minor Patient Management 0 [] - 0 Isolation Patient Management [] - 0 Hearing / Language / Visual special needs [] - 0 Assessment of Community assistance (transportation, D/C planning, etc.) X- 1 15 Additional assistance / Altered mentation X- 1 15 Support Surface(s) Assessment (bed, cushion, seat, etc.) INTERVENTIONS - Wound Cleansing / Measurement [name], [name] ([id number]) [] - 0 Simple Wound Cleansing - one  wound X- 6 5 Complex Wound Cleansing - multiple wounds X- 1 5 Wound Imaging (photographs - any number of wounds) [] - 0 Wound Tracing (instead of photographs) [] -  0 Simple Wound Measurement - one wound X- 6 5 Complex Wound Measurement - multiple wounds INTERVENTIONS - Wound Dressings X - Small Wound Dressing one or multiple wounds 6 10 [] - 0 Medium Wound Dressing one or multiple wounds [] - 0 Large Wound Dressing one or multiple wounds [] - 0 Application of Medications - topical [] - 0 Application of Medications - injection INTERVENTIONS - Miscellaneous [] - External ear exam 0 [] - 0 Specimen Collection (cultures, biopsies, blood, body fluids, etc.) [] - 0 Specimen(s) / Culture(s) sent or taken to Lab for analysis [] - 0 Patient Transfer (multiple staff / Civil Service fast streamer / Similar devices) [] - 0 Simple Staple / Suture removal (25 or less) [] - 0 Complex Staple / Suture removal (26 or more) [] - 0 Hypo / Hyperglycemic Management (close monitor of Blood Glucose) [] - 0 Ankle / Brachial Index (ABI) - do not check if billed separately X- 1 5 Vital Signs Has the patient been seen at the hospital within the last three years: Yes Total Score: 235 Level Of Care: New/Established - Level 5 Electronic Signature(s) Signed: 05/03/2021 4:59:38 PM By: Levora Dredge Entered By: Levora Dredge on 05/03/2021 16:52:55 [name], [name] ([id number]) -------------------------------------------------------------------------------- Encounter Discharge Information Details Patient Name: [name] Date of Service: 05/03/2021 3:00 PM Medical Record Number: [id number] Patient Account Number: [id number] Date of Birth/Sex: 02-22-1932 (88 y.o. F) Treating RN: Levora Dredge Primary Care Esmeralda Malay: Kelton Pillar Other Clinician: Referring Abbas Beyene: Kelton Pillar Treating Coal Nearhood/Extender: Skipper Cliche in Treatment: 18 Encounter Discharge Information Items Discharge Condition:  Stable Ambulatory Status: Wheelchair Discharge Destination: Home Transportation: Other Accompanied By: daughter Schedule Follow-up Appointment: Yes Clinical Summary of Care: Electronic Signature(s) Signed: 05/03/2021 4:54:20 PM By: Levora Dredge Entered By: Levora Dredge on 05/03/2021 16:54:20 Ottaviano, [name] ([id number]) -------------------------------------------------------------------------------- Lower Extremity Assessment Details Patient Name: [name] Date of Service: 05/03/2021 3:00 PM Medical Record Number: [id number] Patient Account Number: [id number] Date of Birth/Sex: Dec 21, 1931 (88 y.o. F) Treating RN: Levora Dredge Primary Care Chai Verdejo: Kelton Pillar Other Clinician: Referring Mazal Ebey: Kelton Pillar Treating Brenley Priore/Extender: Jeri Cos Weeks in Treatment: 18 Vascular Assessment Pulses: Dorsalis Pedis Palpable: [Left:Yes] [Right:Yes] Electronic Signature(s) Signed: 05/03/2021 4:59:38 PM By: Levora Dredge Entered By: Levora Dredge on 05/03/2021 16:07:32 Pherigo, [name] ([id number]) -------------------------------------------------------------------------------- Multi Wound Chart Details Patient Name: [name] Date of Service: 05/03/2021 3:00 PM Medical Record Number: [id number] Patient Account Number: [id number] Date of Birth/Sex: [birth year] (88 y.o. F) Treating RN: Levora Dredge Primary Care Pharell Rolfson: Kelton Pillar Other Clinician: Referring Halen Antenucci: Kelton Pillar Treating Nykia Turko/Extender: Skipper Cliche in Treatment: 18 Vital Signs Height(in): 14 Pulse(bpm): 64 Weight(lbs): 196 Blood Pressure(mmHg): 147/73 Body Mass Index(BMI): 34.7 Temperature(F): 98 Respiratory Rate(breaths/min): 18 Photos: Wound Location: Right Calcaneus Left Calcaneus Left, Medial Malleolus Wounding Event: Pressure Injury Gradually Appeared Gradually Appeared Primary Etiology: Pressure Ulcer Pressure Ulcer Pressure Ulcer Comorbid History: Hypertension,  Type II Diabetes, Hypertension, Type II Diabetes, Hypertension, Type II Diabetes, Osteoarthritis, Dementia Osteoarthritis, Dementia Osteoarthritis, Dementia Date Acquired: 12/25/2020 04/10/2021 04/10/2021 Weeks of Treatment: _0 Wound Status: Open Open Open Wound Recurrence: No No No Measurements L x W x D (cm) 1.2x1.7x0.1 0.7x1.5x0.1 0.5x1x0.2 Area (cm) : 1.602 0.825 0.393 Volume (cm) : 0.16 0.082 0.079 % Reduction in Area: -538.20% 12.40% -42.90% % Reduction in Volume: -220.00% 56.40% -43.60% Classification: Unstageable/Unclassified Category/Stage III Category/Stage III Exudate Amount: Medium Medium Medium Exudate Type: Serosanguineous Serosanguineous Serosanguineous Exudate Color: red, brown red, brown red, brown Granulation Amount: Large (67-100%)  None Present (0%) Small (1-33%) Granulation Quality: Pink N/A Red, Pink Necrotic Amount: Small (1-33%) Large (67-100%) Large (67-100%) Exposed Structures: Fat Layer (Subcutaneous Tissue): Fat Layer (Subcutaneous Tissue): Fat Layer (Subcutaneous Tissue): Yes Yes Yes Fascia: No Fascia: No Fascia: No Tendon: No Tendon: No Tendon: No Muscle: No Muscle: No Muscle: No Joint: No Joint: No Joint: No Bone: No Bone: No Bone: No Epithelialization: None None None Wound Number: _0 Photos: Wound Location: Right, Medial Calcaneus Midline Gluteal fold Left Gluteus Govea, Natayla E. ([id number]) Wounding Event: Pressure Injury Pressure Injury Pressure Injury Primary Etiology: Pressure Ulcer Pressure Ulcer Pressure Ulcer Comorbid History: Hypertension, Type II Diabetes, Hypertension, Type II Diabetes, Hypertension, Type II Diabetes, Osteoarthritis, Dementia Osteoarthritis, Dementia Osteoarthritis, Dementia Date Acquired: 05/03/2021 05/03/2021 05/03/2021 Weeks of Treatment: 0 0 0 Wound Status: Open Open Open Wound Recurrence: No No No Measurements L x W x D (cm) 2.3x3.5x0.1 1x0.5x0.1 3x1x0.01 Area (cm) : 6.322 0.393 2.356 Volume (cm) :  0.632 0.039 0.024 % Reduction in Area: N/A N/A N/A % Reduction in Volume: N/A N/A N/A Classification: Category/Stage II Category/Stage II Category/Stage II Exudate Amount: Medium Medium Medium Exudate Type: Serosanguineous Serosanguineous Serosanguineous Exudate Color: red, brown red, brown red, brown Granulation Amount: None Present (0%) Medium (34-66%) Small (1-33%) Granulation Quality: N/A Pink, Pale Pink Necrotic Amount: Large (67-100%) Medium (34-66%) Large (67-100%) Exposed Structures: N/A Fat Layer (Subcutaneous Tissue): Fat Layer (Subcutaneous Tissue): Yes Yes Epithelialization: None None None Treatment Notes Electronic Signature(s) Signed: 05/03/2021 4:59:38 PM By: Levora Dredge Entered By: Levora Dredge on 05/03/2021 16:34:09 Runnels, [name] ([id number]) -------------------------------------------------------------------------------- [place] Details Patient Name: [name] Date of Service: 05/03/2021 3:00 PM Medical Record Number: [id number] Patient Account Number: [id number] Date of Birth/Sex: 1931/09/10 (88 y.o. F) Treating RN: Levora Dredge Primary Care Karter Haire: Kelton Pillar Other Clinician: Referring Walther Sanagustin: Kelton Pillar Treating Alekai Pocock/Extender: Skipper Cliche in Treatment: 18 Active Inactive Pressure Nursing Diagnoses: Knowledge deficit related to causes and risk factors for pressure ulcer development Knowledge deficit related to management of pressures ulcers Potential for impaired tissue integrity related to pressure, friction, moisture, and shear Goals: Patient will remain free from development of additional pressure ulcers Date Initiated: 12/25/2020 Target Resolution Date: 01/25/2021 Goal Status: Active Patient will remain free of pressure ulcers Date Initiated: 12/25/2020 Target Resolution Date: 01/25/2021 Goal Status: Active Patient/caregiver will verbalize risk factors for pressure ulcer development Date  Initiated: 12/25/2020 Date Inactivated: 04/18/2021 Target Resolution Date: 12/25/2020 Goal Status: Met Patient/caregiver will verbalize understanding of pressure ulcer management Date Initiated: 12/25/2020 Date Inactivated: 04/18/2021 Target Resolution Date: 12/25/2020 Goal Status: Met Interventions: Assess: immobility, friction, shearing, incontinence upon admission and as needed Assess offloading mechanisms upon admission and as needed Assess potential for pressure ulcer upon admission and as needed Provide education on pressure ulcers Notes: Wound/Skin Impairment Nursing Diagnoses: Impaired tissue integrity Goals: Patient/caregiver will verbalize understanding of skin care regimen Date Initiated: 12/25/2020 Date Inactivated: 01/25/2021 Target Resolution Date: 12/25/2020 Goal Status: Met Ulcer/skin breakdown will have a volume reduction of 30% by week 4 Date Initiated: 12/25/2020 Date Inactivated: 04/18/2021 Target Resolution Date: 01/25/2021 Goal Status: Unmet Unmet Reason: missed appt/con't tx Ulcer/skin breakdown will have a volume reduction of 50% by week 8 Date Initiated: 12/25/2020 Date Inactivated: 04/18/2021 Target Resolution Date: 02/24/2021 Goal Status: Unmet Unmet Reason: missed appt/con't tx Ulcer/skin breakdown will have a volume reduction of 80% by week 12 Date Initiated: 12/25/2020 Target Resolution Date: 03/27/2021 Goal Status: Active Ulcer/skin breakdown will heal within 14 weeks Date Initiated: 12/25/2020  Target Resolution Date: 04/27/2021 Goal Status: Active Interventions: Assess patient/caregiver ability to obtain necessary supplies Umbarger, Arrington E. ([id number]) Assess patient/caregiver ability to perform ulcer/skin care regimen upon admission and as needed Assess ulceration(s) every visit Provide education on ulcer and skin care Treatment Activities: Referred to DME Roneshia Drew for dressing supplies : 12/25/2020 Skin care regimen initiated :  12/25/2020 Notes: Electronic Signature(s) Signed: 05/03/2021 4:59:38 PM By: Levora Dredge Entered By: Levora Dredge on 05/03/2021 16:32:19 Rodak, [name] ([id number]) -------------------------------------------------------------------------------- Pain Assessment Details Patient Name: [name] Date of Service: 05/03/2021 3:00 PM Medical Record Number: [id number] Patient Account Number: [id number] Date of Birth/Sex: 05/03/1931 (88 y.o. F) Treating RN: Levora Dredge Primary Care Jeffrie Lofstrom: Kelton Pillar Other Clinician: Referring Ernest Popowski: Kelton Pillar Treating Jayke Caul/Extender: Skipper Cliche in Treatment: 18 Active Problems Location of Pain Severity and Description of Pain Patient Has Paino Yes Site Locations Pain Management and Medication Current Pain Management: Notes patient just states pain Electronic Signature(s) Signed: 05/03/2021 4:59:38 PM By: Levora Dredge Entered By: Levora Dredge on 05/03/2021 15:29:53 Hollenkamp, [name] ([id number]) -------------------------------------------------------------------------------- Patient/Caregiver Education Details Patient Name: [name] Date of Service: 05/03/2021 3:00 PM Medical Record Number: [id number] Patient Account Number: [id number] Date of Birth/Gender: January 04, 1932 (88 y.o. F) Treating RN: Levora Dredge Primary Care Physician: Kelton Pillar Other Clinician: Referring Physician: Kelton Pillar Treating Physician/Extender: Skipper Cliche in Treatment: 18 Education Assessment Education Provided To: Patient Education Topics Provided Pressure: Handouts: Pressure Ulcers: Care and Offloading Methods: Explain/Verbal Responses: State content correctly Wound/Skin Impairment: Handouts: Caring for Your Ulcer Methods: Explain/Verbal Responses: State content correctly Electronic Signature(s) Signed: 05/03/2021 4:59:38 PM By: Levora Dredge Entered By: Levora Dredge on 05/03/2021 16:53:18 Lepak, [name]  ([id number]) -------------------------------------------------------------------------------- Wound Assessment Details Patient Name: [name] Date of Service: 05/03/2021 3:00 PM Medical Record Number: [id number] Patient Account Number: [id number] Date of Birth/Sex: 1931-11-01 (88 y.o. F) Treating RN: Levora Dredge Primary Care Benedict Kue: Kelton Pillar Other Clinician: Referring Laiza Veenstra: Kelton Pillar Treating Brix Brearley/Extender: Skipper Cliche in Treatment: 18 Wound Status Wound Number: 1 Primary Etiology: Pressure Ulcer Wound Location: Right Calcaneus Wound Status: Open Wounding Event: Pressure Injury Comorbid Hypertension, Type II Diabetes, Osteoarthritis, History: Dementia Date Acquired: 12/25/2020 Weeks Of Treatment: 18 Clustered Wound: No Photos Wound Measurements Length: (cm) 1.2 Width: (cm) 1.7 Depth: (cm) 0.1 Area: (cm) 1.602 Volume: (cm) 0.16 % Reduction in Area: -538.2% % Reduction in Volume: -220% Epithelialization: None Tunneling: No Undermining: No Wound Description Classification: Unstageable/Unclassified Exudate Amount: Medium Exudate Type: Serosanguineous Exudate Color: red, brown Foul Odor After Cleansing: No Slough/Fibrino Yes Wound Bed Granulation Amount: Large (67-100%) Exposed Structure Granulation Quality: Pink Fascia Exposed: No Necrotic Amount: Small (1-33%) Fat Layer (Subcutaneous Tissue) Exposed: Yes Necrotic Quality: Adherent Slough Tendon Exposed: No Muscle Exposed: No Joint Exposed: No Bone Exposed: No Treatment Notes Wound #1 (Calcaneus) Wound Laterality: Right Cleanser Normal Saline Discharge Instruction: Wash your hands with soap and water. Remove old dressing, discard into plastic bag and place into trash. Cleanse the wound with Normal Saline prior to applying a clean dressing using gauze sponges, not tissues or cotton balls. Do not scrub or use excessive force. Pat dry using gauze sponges, not tissue or cotton  balls. Cegielski, Weslyn E. ([id number]) Peri-Wound Care Topical Primary Dressing Iodosorb 40 (g) Discharge Instruction: Apply IodoSorb to wound bed only as directed. Zetuvit Plus Silicone Border Dressing 5x5 (in/in) Discharge Instruction: Secure the collagen Secondary Dressing Secured With Compression Wrap Compression Stockings Add-Ons Electronic Signature(s) Signed: 05/03/2021 4:59:38 PM By: Levora Dredge Entered  By: Levora Dredge on 05/03/2021 16:01:36 Reza, [name] ([id number]) -------------------------------------------------------------------------------- Wound Assessment Details Patient Name: Mcclaren, [name]. Date of Service: 05/03/2021 3:00 PM Medical Record Number: [id number] Patient Account Number: [id number] Date of Birth/Sex: 1931-04-02 (88 y.o. F) Treating RN: Levora Dredge Primary Care : Kelton Pillar Other Clinician: Referring : Kelton Pillar Treating /Extender: Skipper Cliche in Treatment: 18 Wound Status Wound Number: 3 Primary Etiology: Pressure Ulcer Wound Location: Left Calcaneus Wound Status: Open Wounding Event: Gradually Appeared Comorbid Hypertension, Type II Diabetes, Osteoarthritis, History: Dementia Date Acquired: 04/10/2021 Weeks Of Treatment: 2 Clustered Wound: No Photos Wound Measurements Length: (cm) 0.7 % Reduc Width: (cm) 1.5 % Reduc Depth: (cm) 0.1 Epithel Area: (cm) 0.825 Tunnel Volume: (cm) 0.082 Underm Star Endi Maxi tion in Area: 12.4% tion in Volume: 56.4% ialization: None ing: No ining: Yes ting Position (o'clock): 6 ng Position (o'clock): 12 mum Distance: (cm) 0.3 Wound Description Classification: Category/Stage III Foul Od Exudate Amount: Medium Slough/ Exudate Type: Serosanguineous Exudate Color: red, brown or After Cleansing: No Fibrino Yes Wound Bed Granulation Amount: None Present (0%) Exposed Structure Necrotic Amount: Large (67-100%) Fascia Exposed: No Necrotic Quality: Adherent  Slough Fat Layer (Subcutaneous Tissue) Exposed: Yes Tendon Exposed: No Muscle Exposed: No Joint Exposed: No Bone Exposed: No Treatment Notes Wound #3 (Calcaneus) Wound Laterality: Left Cleanser Normal Saline Goldbach, Chrystal E. ([id number]) Discharge Instruction: Wash your hands with soap and water. Remove old dressing, discard into plastic bag and place into trash. Cleanse the wound with Normal Saline prior to applying a clean dressing using gauze sponges, not tissues or cotton balls. Do not scrub or use excessive force. Pat dry using gauze sponges, not tissue or cotton balls. Peri-Wound Care Topical Primary Dressing Iodosorb 40 (g) Discharge Instruction: Apply IodoSorb to wound bed only as directed. Zetuvit Plus Silicone Border Dressing 5x5 (in/in) Discharge Instruction: Secure the collagen Secondary Dressing Secured With Compression Wrap Compression Stockings Add-Ons Electronic Signature(s) Signed: 05/03/2021 4:50:21 PM By: Levora Dredge Entered By: Levora Dredge on 05/03/2021 16:50:21 Drakeford, [name] ([id number]) -------------------------------------------------------------------------------- Wound Assessment Details Patient Name: [name] Date of Service: 05/03/2021 3:00 PM Medical Record Number: [id number] Patient Account Number: [id number] Date of Birth/Sex: Sep 03, 1931 (88 y.o. F) Treating RN: Levora Dredge Primary Care : Kelton Pillar Other Clinician: Referring : Kelton Pillar Treating /Extender: Skipper Cliche in Treatment: 18 Wound Status Wound Number: 4 Primary Etiology: Pressure Ulcer Wound Location: Left, Medial Malleolus Wound Status: Open Wounding Event: Gradually Appeared Comorbid Hypertension, Type II Diabetes, Osteoarthritis, History: Dementia Date Acquired: 04/10/2021 Weeks Of Treatment: 2 Clustered Wound: No Photos Wound Measurements Length: (cm) 0.5 Width: (cm) 1 Depth: (cm) 0.2 Area: (cm) 0.393 Volume: (cm)  0.079 % Reduction in Area: -42.9% % Reduction in Volume: -43.6% Epithelialization: None Tunneling: No Undermining: No Wound Description Classification: Category/Stage III Exudate Amount: Medium Exudate Type: Serosanguineous Exudate Color: red, brown Foul Odor After Cleansing: No Slough/Fibrino Yes Wound Bed Granulation Amount: Small (1-33%) Exposed Structure Granulation Quality: Red, Pink Fascia Exposed: No Necrotic Amount: Large (67-100%) Fat Layer (Subcutaneous Tissue) Exposed: Yes Necrotic Quality: Adherent Slough Tendon Exposed: No Muscle Exposed: No Joint Exposed: No Bone Exposed: No Treatment Notes Wound #4 (Malleolus) Wound Laterality: Left, Medial Cleanser Normal Saline Discharge Instruction: Wash your hands with soap and water. Remove old dressing, discard into plastic bag and place into trash. Cleanse the wound with Normal Saline prior to applying a clean dressing using gauze sponges, not tissues or cotton balls. Do not scrub or use excessive force. Pat dry  using gauze sponges, not tissue or cotton balls. Warmoth, Raiza E. ([id number]) Peri-Wound Care Topical Primary Dressing Iodosorb 40 (g) Discharge Instruction: Apply IodoSorb to wound bed only as directed. Zetuvit Plus Silicone Border Dressing 5x5 (in/in) Discharge Instruction: Secure the collagen Secondary Dressing Secured With Compression Wrap Compression Stockings Add-Ons Electronic Signature(s) Signed: 05/03/2021 4:59:38 PM By: Levora Dredge Entered By: Levora Dredge on 05/03/2021 16:03:09 Delrossi, [name] ([id number]) -------------------------------------------------------------------------------- Wound Assessment Details Patient Name: [name] Date of Service: 05/03/2021 3:00 PM Medical Record Number: [id number] Patient Account Number: [id number] Date of Birth/Sex: 10-02-1931 (88 y.o. F) Treating RN: Levora Dredge Primary Care : Kelton Pillar Other Clinician: Referring :  Kelton Pillar Treating /Extender: Skipper Cliche in Treatment: 18 Wound Status Wound Number: 5 Primary Etiology: Pressure Ulcer Wound Location: Right, Medial Calcaneus Wound Status: Open Wounding Event: Pressure Injury Comorbid Hypertension, Type II Diabetes, Osteoarthritis, History: Dementia Date Acquired: 05/03/2021 Weeks Of Treatment: 0 Clustered Wound: No Photos Wound Measurements Length: (cm) 2.3 Width: (cm) 3.5 Depth: (cm) 0.1 Area: (cm) 6.322 Volume: (cm) 0.632 % Reduction in Area: % Reduction in Volume: Epithelialization: None Tunneling: No Undermining: No Wound Description Classification: Category/Stage II Exudate Amount: Medium Exudate Type: Serosanguineous Exudate Color: red, brown Foul Odor After Cleansing: No Slough/Fibrino Yes Wound Bed Granulation Amount: None Present (0%) Necrotic Amount: Large (67-100%) Necrotic Quality: Adherent Slough Treatment Notes Wound #5 (Calcaneus) Wound Laterality: Right, Medial Cleanser Normal Saline Discharge Instruction: Wash your hands with soap and water. Remove old dressing, discard into plastic bag and place into trash. Cleanse the wound with Normal Saline prior to applying a clean dressing using gauze sponges, not tissues or cotton balls. Do not scrub or use excessive force. Pat dry using gauze sponges, not tissue or cotton balls. Peri-Wound Care Topical Betadine Thiam, Dani E. ([id number]) Discharge Instruction: Apply betadine as directed. Primary Dressing Secondary Dressing Secured With Compression Wrap Compression Stockings Add-Ons Electronic Signature(s) Signed: 05/03/2021 4:59:38 PM By: Levora Dredge Entered By: Levora Dredge on 05/03/2021 16:04:38 Czerwinski, [name] ([id number]) -------------------------------------------------------------------------------- Wound Assessment Details Patient Name: [name] Date of Service: 05/03/2021 3:00 PM Medical Record Number: [id number] Patient  Account Number: [id number] Date of Birth/Sex: 09-29-1931 (88 y.o. F) Treating RN: Levora Dredge Primary Care : Kelton Pillar Other Clinician: Referring : Kelton Pillar Treating /Extender: Skipper Cliche in Treatment: 18 Wound Status Wound Number: 6 Primary Etiology: Pressure Ulcer Wound Location: Midline Gluteal fold Wound Status: Open Wounding Event: Pressure Injury Comorbid Hypertension, Type II Diabetes, Osteoarthritis, History: Dementia Date Acquired: 05/03/2021 Weeks Of Treatment: 0 Clustered Wound: No Photos Wound Measurements Length: (cm) 1 Width: (cm) 0.5 Depth: (cm) 0.1 Area: (cm) 0.393 Volume: (cm) 0.039 % Reduction in Area: % Reduction in Volume: Epithelialization: None Tunneling: No Undermining: No Wound Description Classification: Category/Stage II Exudate Amount: Medium Exudate Type: Serosanguineous Exudate Color: red, brown Foul Odor After Cleansing: No Slough/Fibrino Yes Wound Bed Granulation Amount: Medium (34-66%) Exposed Structure Granulation Quality: Pink, Pale Fat Layer (Subcutaneous Tissue) Exposed: Yes Necrotic Amount: Medium (34-66%) Necrotic Quality: Adherent Slough Treatment Notes Wound #6 (Gluteal fold) Wound Laterality: Midline Cleanser Normal Saline Discharge Instruction: Wash your hands with soap and water. Remove old dressing, discard into plastic bag and place into trash. Cleanse the wound with Normal Saline prior to applying a clean dressing using gauze sponges, not tissues or cotton balls. Do not scrub or use excessive force. Pat dry using gauze sponges, not tissue or cotton balls. Wound Cleanser Discharge Instruction: Wash your hands with soap and water.  Remove old dressing, discard into plastic bag and place into trash. Cleanse the wound with Wound Cleanser prior to applying a clean dressing using gauze sponges, not tissues or cotton balls. Do not Reyez, Rudene E. ([id number]) scrub or use excessive  force. Pat dry using gauze sponges, not tissue or cotton balls. Peri-Wound Care Topical Primary Dressing Silvercel 4 1/4x 4 1/4 (in/in) Discharge Instruction: Apply Silvercel 4 1/4x 4 1/4 (in/in) as instructed Secondary Dressing Zetuvit Plus Silicone Non-bordered 5x5 (in/in) Secured With Compression Wrap Compression Stockings Add-Ons Electronic Signature(s) Signed: 05/03/2021 4:59:38 PM By: Levora Dredge Entered By: Levora Dredge on 05/03/2021 16:05:59 Henard, [name] ([id number]) -------------------------------------------------------------------------------- Wound Assessment Details Patient Name: [name] Date of Service: 05/03/2021 3:00 PM Medical Record Number: [id number] Patient Account Number: [id number] Date of Birth/Sex: May 04, 1931 (88 y.o. F) Treating RN: Donnamarie Poag Primary Care Gay Moncivais: Kelton Pillar Other Clinician: Referring Ithan Touhey: Kelton Pillar Treating Elbie Statzer/Extender: Skipper Cliche in Treatment: 18 Wound Status Wound Number: 7 Primary Etiology: Pressure Ulcer Wound Location: Left Gluteus Wound Status: Open Wounding Event: Pressure Injury Comorbid Hypertension, Type II Diabetes, Osteoarthritis, History: Dementia Date Acquired: 05/03/2021 Weeks Of Treatment: 0 Clustered Wound: No Photos Wound Measurements Length: (cm) 3 Width: (cm) 1 Depth: (cm) 0.1 Area: (cm) 2.356 Volume: (cm) 0.236 % Reduction in Area: 0% % Reduction in Volume: -883.3% Epithelialization: None Tunneling: No Undermining: No Wound Description Classification: Category/Stage II Exudate Amount: Medium Exudate Type: Serosanguineous Exudate Color: red, brown Foul Odor After Cleansing: No Slough/Fibrino Yes Wound Bed Granulation Amount: Small (1-33%) Exposed Structure Granulation Quality: Pink Fat Layer (Subcutaneous Tissue) Exposed: Yes Necrotic Amount: Large (67-100%) Treatment Notes Wound #7 (Gluteus) Wound Laterality: Left Cleanser Normal Saline Discharge  Instruction: Wash your hands with soap and water. Remove old dressing, discard into plastic bag and place into trash. Cleanse the wound with Normal Saline prior to applying a clean dressing using gauze sponges, not tissues or cotton balls. Do not scrub or use excessive force. Pat dry using gauze sponges, not tissue or cotton balls. Wound Cleanser Discharge Instruction: Wash your hands with soap and water. Remove old dressing, discard into plastic bag and place into trash. Cleanse the wound with Wound Cleanser prior to applying a clean dressing using gauze sponges, not tissues or cotton balls. Do not scrub or use excessive force. Pat dry using gauze sponges, not tissue or cotton balls. Melendrez, Kim E. ([id number]) Peri-Wound Care Topical Primary Dressing Silvercel 4 1/4x 4 1/4 (in/in) Discharge Instruction: Apply Silvercel 4 1/4x 4 1/4 (in/in) as instructed Secondary Dressing Zetuvit Plus Silicone Non-bordered 5x5 (in/in) Secured With Compression Wrap Compression Stockings Add-Ons Electronic Signature(s) Signed: 05/03/2021 4:55:51 PM By: Donnamarie Poag Entered By: Donnamarie Poag on 05/03/2021 16:48:02 Lindner, [name] ([id number]) -------------------------------------------------------------------------------- [place] Details Patient Name: [name] Date of Service: 05/03/2021 3:00 PM Medical Record Number: [id number] Patient Account Number: [id number] Date of Birth/Sex: 07-27-1931 (88 y.o. F) Treating RN: Levora Dredge Primary Care Jasyn Mey: Kelton Pillar Other Clinician: Referring Ebrima Ranta: Kelton Pillar Treating Tien Spooner/Extender: Skipper Cliche in Treatment: 18 Vital Signs Time Taken: 15:25 Temperature (F): 98 Height (in): 63 Pulse (bpm): 70 Weight (lbs): 196 Respiratory Rate (breaths/min): 18 Body Mass Index (BMI): 34.7 Blood Pressure (mmHg): 147/73 Reference Range: 80 - 120 mg / dl Electronic Signature(s) Signed: 05/03/2021 4:59:38 PM By: Levora Dredge Entered By:  Levora Dredge on 05/03/2021 15:29:34

## 2021-05-07 DIAGNOSIS — N6019 Diffuse cystic mastopathy of unspecified breast: Secondary | ICD-10-CM | POA: Diagnosis not present

## 2021-05-07 DIAGNOSIS — K2270 Barrett's esophagus without dysplasia: Secondary | ICD-10-CM | POA: Diagnosis not present

## 2021-05-07 DIAGNOSIS — Z7984 Long term (current) use of oral hypoglycemic drugs: Secondary | ICD-10-CM | POA: Diagnosis not present

## 2021-05-07 DIAGNOSIS — Z87891 Personal history of nicotine dependence: Secondary | ICD-10-CM | POA: Diagnosis not present

## 2021-05-07 DIAGNOSIS — I10 Essential (primary) hypertension: Secondary | ICD-10-CM | POA: Diagnosis not present

## 2021-05-07 DIAGNOSIS — L89620 Pressure ulcer of left heel, unstageable: Secondary | ICD-10-CM | POA: Diagnosis not present

## 2021-05-07 DIAGNOSIS — E1122 Type 2 diabetes mellitus with diabetic chronic kidney disease: Secondary | ICD-10-CM | POA: Diagnosis not present

## 2021-05-07 DIAGNOSIS — Z993 Dependence on wheelchair: Secondary | ICD-10-CM | POA: Diagnosis not present

## 2021-05-07 DIAGNOSIS — H409 Unspecified glaucoma: Secondary | ICD-10-CM | POA: Diagnosis not present

## 2021-05-07 DIAGNOSIS — Z4800 Encounter for change or removal of nonsurgical wound dressing: Secondary | ICD-10-CM | POA: Diagnosis not present

## 2021-05-07 DIAGNOSIS — D126 Benign neoplasm of colon, unspecified: Secondary | ICD-10-CM | POA: Diagnosis not present

## 2021-05-07 DIAGNOSIS — Z9181 History of falling: Secondary | ICD-10-CM | POA: Diagnosis not present

## 2021-05-07 DIAGNOSIS — L89612 Pressure ulcer of right heel, stage 2: Secondary | ICD-10-CM | POA: Diagnosis not present

## 2021-05-07 DIAGNOSIS — K449 Diaphragmatic hernia without obstruction or gangrene: Secondary | ICD-10-CM | POA: Diagnosis not present

## 2021-05-07 DIAGNOSIS — N189 Chronic kidney disease, unspecified: Secondary | ICD-10-CM | POA: Diagnosis not present

## 2021-05-07 DIAGNOSIS — E049 Nontoxic goiter, unspecified: Secondary | ICD-10-CM | POA: Diagnosis not present

## 2021-05-07 DIAGNOSIS — K5790 Diverticulosis of intestine, part unspecified, without perforation or abscess without bleeding: Secondary | ICD-10-CM | POA: Diagnosis not present

## 2021-05-07 DIAGNOSIS — E7800 Pure hypercholesterolemia, unspecified: Secondary | ICD-10-CM | POA: Diagnosis not present

## 2021-05-08 DIAGNOSIS — K2270 Barrett's esophagus without dysplasia: Secondary | ICD-10-CM | POA: Diagnosis not present

## 2021-05-08 DIAGNOSIS — M1990 Unspecified osteoarthritis, unspecified site: Secondary | ICD-10-CM | POA: Diagnosis not present

## 2021-05-08 DIAGNOSIS — Z993 Dependence on wheelchair: Secondary | ICD-10-CM | POA: Diagnosis not present

## 2021-05-08 DIAGNOSIS — Z7984 Long term (current) use of oral hypoglycemic drugs: Secondary | ICD-10-CM | POA: Diagnosis not present

## 2021-05-08 DIAGNOSIS — L89620 Pressure ulcer of left heel, unstageable: Secondary | ICD-10-CM | POA: Diagnosis not present

## 2021-05-08 DIAGNOSIS — Z9181 History of falling: Secondary | ICD-10-CM | POA: Diagnosis not present

## 2021-05-08 DIAGNOSIS — E049 Nontoxic goiter, unspecified: Secondary | ICD-10-CM | POA: Diagnosis not present

## 2021-05-08 DIAGNOSIS — Z87891 Personal history of nicotine dependence: Secondary | ICD-10-CM | POA: Diagnosis not present

## 2021-05-08 DIAGNOSIS — K449 Diaphragmatic hernia without obstruction or gangrene: Secondary | ICD-10-CM | POA: Diagnosis not present

## 2021-05-08 DIAGNOSIS — N6019 Diffuse cystic mastopathy of unspecified breast: Secondary | ICD-10-CM | POA: Diagnosis not present

## 2021-05-08 DIAGNOSIS — I10 Essential (primary) hypertension: Secondary | ICD-10-CM | POA: Diagnosis not present

## 2021-05-08 DIAGNOSIS — L89893 Pressure ulcer of other site, stage 3: Secondary | ICD-10-CM | POA: Diagnosis not present

## 2021-05-08 DIAGNOSIS — L89303 Pressure ulcer of unspecified buttock, stage 3: Secondary | ICD-10-CM | POA: Diagnosis not present

## 2021-05-08 DIAGNOSIS — L89623 Pressure ulcer of left heel, stage 3: Secondary | ICD-10-CM | POA: Diagnosis not present

## 2021-05-08 DIAGNOSIS — E1122 Type 2 diabetes mellitus with diabetic chronic kidney disease: Secondary | ICD-10-CM | POA: Diagnosis not present

## 2021-05-08 DIAGNOSIS — R262 Difficulty in walking, not elsewhere classified: Secondary | ICD-10-CM | POA: Diagnosis not present

## 2021-05-08 DIAGNOSIS — E7800 Pure hypercholesterolemia, unspecified: Secondary | ICD-10-CM | POA: Diagnosis not present

## 2021-05-08 DIAGNOSIS — K5790 Diverticulosis of intestine, part unspecified, without perforation or abscess without bleeding: Secondary | ICD-10-CM | POA: Diagnosis not present

## 2021-05-08 DIAGNOSIS — H409 Unspecified glaucoma: Secondary | ICD-10-CM | POA: Diagnosis not present

## 2021-05-08 DIAGNOSIS — D126 Benign neoplasm of colon, unspecified: Secondary | ICD-10-CM | POA: Diagnosis not present

## 2021-05-08 DIAGNOSIS — Z4800 Encounter for change or removal of nonsurgical wound dressing: Secondary | ICD-10-CM | POA: Diagnosis not present

## 2021-05-08 DIAGNOSIS — L89312 Pressure ulcer of right buttock, stage 2: Secondary | ICD-10-CM | POA: Diagnosis not present

## 2021-05-08 DIAGNOSIS — L89612 Pressure ulcer of right heel, stage 2: Secondary | ICD-10-CM | POA: Diagnosis not present

## 2021-05-08 DIAGNOSIS — N189 Chronic kidney disease, unspecified: Secondary | ICD-10-CM | POA: Diagnosis not present

## 2021-05-10 DIAGNOSIS — M1990 Unspecified osteoarthritis, unspecified site: Secondary | ICD-10-CM | POA: Diagnosis not present

## 2021-05-14 DIAGNOSIS — H409 Unspecified glaucoma: Secondary | ICD-10-CM | POA: Diagnosis not present

## 2021-05-14 DIAGNOSIS — K449 Diaphragmatic hernia without obstruction or gangrene: Secondary | ICD-10-CM | POA: Diagnosis not present

## 2021-05-14 DIAGNOSIS — Z87891 Personal history of nicotine dependence: Secondary | ICD-10-CM | POA: Diagnosis not present

## 2021-05-14 DIAGNOSIS — E1122 Type 2 diabetes mellitus with diabetic chronic kidney disease: Secondary | ICD-10-CM | POA: Diagnosis not present

## 2021-05-14 DIAGNOSIS — E7800 Pure hypercholesterolemia, unspecified: Secondary | ICD-10-CM | POA: Diagnosis not present

## 2021-05-14 DIAGNOSIS — Z993 Dependence on wheelchair: Secondary | ICD-10-CM | POA: Diagnosis not present

## 2021-05-14 DIAGNOSIS — K5790 Diverticulosis of intestine, part unspecified, without perforation or abscess without bleeding: Secondary | ICD-10-CM | POA: Diagnosis not present

## 2021-05-14 DIAGNOSIS — Z7984 Long term (current) use of oral hypoglycemic drugs: Secondary | ICD-10-CM | POA: Diagnosis not present

## 2021-05-14 DIAGNOSIS — E049 Nontoxic goiter, unspecified: Secondary | ICD-10-CM | POA: Diagnosis not present

## 2021-05-14 DIAGNOSIS — Z9181 History of falling: Secondary | ICD-10-CM | POA: Diagnosis not present

## 2021-05-14 DIAGNOSIS — I10 Essential (primary) hypertension: Secondary | ICD-10-CM | POA: Diagnosis not present

## 2021-05-14 DIAGNOSIS — Z4800 Encounter for change or removal of nonsurgical wound dressing: Secondary | ICD-10-CM | POA: Diagnosis not present

## 2021-05-14 DIAGNOSIS — N189 Chronic kidney disease, unspecified: Secondary | ICD-10-CM | POA: Diagnosis not present

## 2021-05-14 DIAGNOSIS — N6019 Diffuse cystic mastopathy of unspecified breast: Secondary | ICD-10-CM | POA: Diagnosis not present

## 2021-05-14 DIAGNOSIS — L89620 Pressure ulcer of left heel, unstageable: Secondary | ICD-10-CM | POA: Diagnosis not present

## 2021-05-14 DIAGNOSIS — K2270 Barrett's esophagus without dysplasia: Secondary | ICD-10-CM | POA: Diagnosis not present

## 2021-05-14 DIAGNOSIS — L89612 Pressure ulcer of right heel, stage 2: Secondary | ICD-10-CM | POA: Diagnosis not present

## 2021-05-14 DIAGNOSIS — D126 Benign neoplasm of colon, unspecified: Secondary | ICD-10-CM | POA: Diagnosis not present

## 2021-05-14 NOTE — Progress Notes (Signed)
[facility] RN NOTE  PATIENT NAME: [name] DOB: [birth year] MRN: [id number]  PRIMARY CARE PROVIDER: Kelton Pillar, MD  RESPONSIBLE PARTY: [name] (daughter) Acct ID - Guarantor Home Phone Work Phone Relationship Acct Type  [id number] [name], [name] [phone]  Self P/F     [address], [place], [place] [address]   Due to the COVID-19 crisis, this virtual check-in visit was done via telephone from my office and it was initiated and consent by this patient and or family.  Joint telephonic encounter made with M. Lonon LCSW. Spoke with patient's daughter [name]. Daughter reports that patient continues to notice a cognitive decline in patient. She is progressively weaker and now requiring 2 person assistance with standing. She is ambulatory using her walker. It is getting more difficult to get patient out of the house to appointments. She is dependent for ADLs but able to feed herself independently. She now has a hoyer lift, fully electric bed, wheelchair and a raised toilet seat. She denies dysphagia, but her intake has declined where she is now only eating about 50% of 1 meal per day plus snacks. She has a wound on her heel and is going to an outpatient wound clinic weekly on Thursdays for treatment. She continues with Mickel Crow to assist her with personal care needs. Palliative care will continue to follow.    (Duration of visit and documentation 30 minutes0   Daryl Eastern, RN BSN

## 2021-05-16 ENCOUNTER — Encounter: Payer: Medicare Other | Attending: Internal Medicine | Admitting: Internal Medicine

## 2021-05-16 ENCOUNTER — Ambulatory Visit: Payer: Medicare Other | Admitting: Physician Assistant

## 2021-05-16 ENCOUNTER — Other Ambulatory Visit: Payer: Self-pay

## 2021-05-16 DIAGNOSIS — L89322 Pressure ulcer of left buttock, stage 2: Secondary | ICD-10-CM | POA: Diagnosis not present

## 2021-05-16 DIAGNOSIS — L89613 Pressure ulcer of right heel, stage 3: Secondary | ICD-10-CM | POA: Diagnosis not present

## 2021-05-16 DIAGNOSIS — L89623 Pressure ulcer of left heel, stage 3: Secondary | ICD-10-CM | POA: Diagnosis not present

## 2021-05-16 DIAGNOSIS — E7800 Pure hypercholesterolemia, unspecified: Secondary | ICD-10-CM | POA: Diagnosis not present

## 2021-05-16 DIAGNOSIS — L89612 Pressure ulcer of right heel, stage 2: Secondary | ICD-10-CM | POA: Diagnosis not present

## 2021-05-16 DIAGNOSIS — N6019 Diffuse cystic mastopathy of unspecified breast: Secondary | ICD-10-CM | POA: Diagnosis not present

## 2021-05-16 DIAGNOSIS — Z9181 History of falling: Secondary | ICD-10-CM | POA: Diagnosis not present

## 2021-05-16 DIAGNOSIS — L89312 Pressure ulcer of right buttock, stage 2: Secondary | ICD-10-CM | POA: Diagnosis not present

## 2021-05-16 DIAGNOSIS — K2270 Barrett's esophagus without dysplasia: Secondary | ICD-10-CM | POA: Diagnosis not present

## 2021-05-16 DIAGNOSIS — Z7984 Long term (current) use of oral hypoglycemic drugs: Secondary | ICD-10-CM | POA: Diagnosis not present

## 2021-05-16 DIAGNOSIS — F0150 Vascular dementia without behavioral disturbance: Secondary | ICD-10-CM | POA: Diagnosis not present

## 2021-05-16 DIAGNOSIS — E11622 Type 2 diabetes mellitus with other skin ulcer: Secondary | ICD-10-CM | POA: Diagnosis not present

## 2021-05-16 DIAGNOSIS — E1122 Type 2 diabetes mellitus with diabetic chronic kidney disease: Secondary | ICD-10-CM | POA: Diagnosis not present

## 2021-05-16 DIAGNOSIS — L89893 Pressure ulcer of other site, stage 3: Secondary | ICD-10-CM | POA: Diagnosis not present

## 2021-05-16 DIAGNOSIS — N189 Chronic kidney disease, unspecified: Secondary | ICD-10-CM | POA: Diagnosis not present

## 2021-05-16 DIAGNOSIS — D126 Benign neoplasm of colon, unspecified: Secondary | ICD-10-CM | POA: Diagnosis not present

## 2021-05-16 DIAGNOSIS — H409 Unspecified glaucoma: Secondary | ICD-10-CM | POA: Diagnosis not present

## 2021-05-16 DIAGNOSIS — Z87891 Personal history of nicotine dependence: Secondary | ICD-10-CM | POA: Diagnosis not present

## 2021-05-16 DIAGNOSIS — E119 Type 2 diabetes mellitus without complications: Secondary | ICD-10-CM | POA: Diagnosis not present

## 2021-05-16 DIAGNOSIS — K449 Diaphragmatic hernia without obstruction or gangrene: Secondary | ICD-10-CM | POA: Diagnosis not present

## 2021-05-16 DIAGNOSIS — K5790 Diverticulosis of intestine, part unspecified, without perforation or abscess without bleeding: Secondary | ICD-10-CM | POA: Diagnosis not present

## 2021-05-16 DIAGNOSIS — Z4800 Encounter for change or removal of nonsurgical wound dressing: Secondary | ICD-10-CM | POA: Diagnosis not present

## 2021-05-16 DIAGNOSIS — L89620 Pressure ulcer of left heel, unstageable: Secondary | ICD-10-CM | POA: Diagnosis not present

## 2021-05-16 DIAGNOSIS — I10 Essential (primary) hypertension: Secondary | ICD-10-CM | POA: Diagnosis not present

## 2021-05-16 DIAGNOSIS — E049 Nontoxic goiter, unspecified: Secondary | ICD-10-CM | POA: Diagnosis not present

## 2021-05-16 DIAGNOSIS — Z993 Dependence on wheelchair: Secondary | ICD-10-CM | POA: Diagnosis not present

## 2021-05-16 NOTE — Progress Notes (Signed)
Philemon, DONI BACHA ([id number]) Visit Report for 05/16/2021 Arrival Information Details Patient Name: [name], [name] Date of Service: 05/16/2021 3:15 PM Medical Record Number: [id number] Patient Account Number: [id number] Date of Birth/Sex: [birth year] (88 y.o. F) Treating RN: Levora Dredge Primary Care Jheremy Boger: Kelton Pillar Other Clinician: Referring Huan Pollok: Kelton Pillar Treating Phillips Goulette/Extender: Yaakov Guthrie in Treatment: 20 Visit Information History Since Last Visit Added or deleted any medications: Yes Patient Arrived: Wheel Chair Any new allergies or adverse reactions: No Arrival Time: 15:22 Had a fall or experienced change in No Accompanied By: daughter activities of daily living that may affect Transfer Assistance: Harrel Lemon Lift risk of falls: Patient Has Alerts: Yes Hospitalized since last visit: No Patient Alerts: Type II Diabetic Has Dressing in Place as Prescribed: Yes HOYER/put in chair Pain Present Now: No [place] Notes per daughter dressings on bilat heels have not been changed since last seen in wound clinic 2/24 Electronic Signature(s) Signed: 05/16/2021 4:18:50 PM By: Levora Dredge Entered By: Levora Dredge on 05/16/2021 15:23:51 Munroe, [name] ([id number]) -------------------------------------------------------------------------------- Clinic Level of Care Assessment Details Patient Name: [name], [name] Date of Service: 05/16/2021 3:15 PM Medical Record Number: [id number] Patient Account Number: [id number] Date of Birth/Sex: December 08, 1931 (88 y.o. F) Treating RN: Levora Dredge Primary Care Lilliane Sposito: Kelton Pillar Other Clinician: Referring Ashya Nicolaisen: Kelton Pillar Treating Nehemie Casserly/Extender: Yaakov Guthrie in Treatment: 20 Clinic Level of Care Assessment Items TOOL 4 Quantity Score []  - Use when only an EandM is performed on FOLLOW-UP visit 0 ASSESSMENTS - Nursing Assessment / Reassessment []  - Reassessment of Co-morbidities  (includes updates in patient status) 0 X- 1 5 Reassessment of Adherence to Treatment Plan ASSESSMENTS - Wound and Skin Assessment / Reassessment []  - Simple Wound Assessment / Reassessment - one wound 0 X- 4 5 Complex Wound Assessment / Reassessment - multiple wounds []  - 0 Dermatologic / Skin Assessment (not related to wound area) ASSESSMENTS - Focused Assessment []  - Circumferential Edema Measurements - multi extremities 0 []  - 0 Nutritional Assessment / Counseling / Intervention []  - 0 Lower Extremity Assessment (monofilament, tuning fork, pulses) []  - 0 Peripheral Arterial Disease Assessment (using hand held doppler) ASSESSMENTS - Ostomy and/or Continence Assessment and Care []  - Incontinence Assessment and Management 0 []  - 0 Ostomy Care Assessment and Management (repouching, etc.) PROCESS - Coordination of Care X - Simple Patient / Family Education for ongoing care 1 15 []  - 0 Complex (extensive) Patient / Family Education for ongoing care []  - 0 Staff obtains Programmer, systems, Records, Test Results / Process Orders []  - 0 Staff telephones HHA, Nursing Homes / Clarify orders / etc []  - 0 Routine Transfer to another Facility (non-emergent condition) []  - 0 Routine Hospital Admission (non-emergent condition) []  - 0 New Admissions / Biomedical engineer / Ordering NPWT, Apligraf, etc. []  - 0 Emergency Hospital Admission (emergent condition) X- 1 10 Simple Discharge Coordination []  - 0 Complex (extensive) Discharge Coordination PROCESS - Special Needs []  - Pediatric / Minor Patient Management 0 []  - 0 Isolation Patient Management []  - 0 Hearing / Language / Visual special needs []  - 0 Assessment of Community assistance (transportation, D/C planning, etc.) []  - 0 Additional assistance / Altered mentation []  - 0 Support Surface(s) Assessment (bed, cushion, seat, etc.) INTERVENTIONS - Wound Cleansing / Measurement Eppolito, Jene E. ([id number]) []  - 0 Simple Wound  Cleansing - one wound X- 4 5 Complex Wound Cleansing - multiple wounds X- 1 5 Wound Imaging (photographs - any number of wounds) []  -  0 Wound Tracing (instead of photographs) $RemoveBeforeD'[]'OSJRQTEzUxuXEQ$  - 0 Simple Wound Measurement - one wound X- 4 5 Complex Wound Measurement - multiple wounds INTERVENTIONS - Wound Dressings X - Small Wound Dressing one or multiple wounds 4 10 $Re'[]'lTF$  - 0 Medium Wound Dressing one or multiple wounds $RemoveBeforeD'[]'aITjuCYRyDwRME$  - 0 Large Wound Dressing one or multiple wounds $RemoveBeforeD'[]'GMWHQuCmVMyerF$  - 0 Application of Medications - topical $RemoveB'[]'HwLUmPbg$  - 0 Application of Medications - injection INTERVENTIONS - Miscellaneous $RemoveBeforeD'[]'pYwSkKaZOnTVdo$  - External ear exam 0 $Remo'[]'zgiMB$  - 0 Specimen Collection (cultures, biopsies, blood, body fluids, etc.) $RemoveBefor'[]'lSxlFSCwqXkn$  - 0 Specimen(s) / Culture(s) sent or taken to Lab for analysis $RemoveBefo'[]'NEkcccmLfVY$  - 0 Patient Transfer (multiple staff / Civil Service fast streamer / Similar devices) $RemoveBeforeDE'[]'LwfuEadKnHkWAXC$  - 0 Simple Staple / Suture removal (25 or less) $Remove'[]'NcbaWvs$  - 0 Complex Staple / Suture removal (26 or more) $Remove'[]'pGLdfUn$  - 0 Hypo / Hyperglycemic Management (close monitor of Blood Glucose) $RemoveBefore'[]'kMameOYebwaiF$  - 0 Ankle / Brachial Index (ABI) - do not check if billed separately X- 1 5 Vital Signs Has the patient been seen at the hospital within the last three years: Yes Total Score: 140 Level Of Care: New/Established - Level 4 Electronic Signature(s) Signed: 05/16/2021 4:18:50 PM By: Levora Dredge Entered By: Levora Dredge on 05/16/2021 16:08:50 [name], [name] ([id number]) -------------------------------------------------------------------------------- Encounter Discharge Information Details Patient Name: [name] Date of Service: 05/16/2021 3:15 PM Medical Record Number: [id number] Patient Account Number: [id number] Date of Birth/Sex: August 19, 1931 (88 y.o. F) Treating RN: Levora Dredge Primary Care Anouk Critzer: Kelton Pillar Other Clinician: Referring Reagan Behlke: Kelton Pillar Treating Lam Bjorklund/Extender: Yaakov Guthrie in Treatment: 20 Encounter Discharge Information Items Discharge  Condition: Stable Ambulatory Status: Wheelchair Discharge Destination: Home Transportation: Other Accompanied By: daughter Schedule Follow-up Appointment: Yes Clinical Summary of Care: Electronic Signature(s) Signed: 05/16/2021 4:13:10 PM By: Levora Dredge Entered By: Levora Dredge on 05/16/2021 16:13:09 [name], [name] ([id number]) -------------------------------------------------------------------------------- Lower Extremity Assessment Details Patient Name: [name] Date of Service: 05/16/2021 3:15 PM Medical Record Number: [id number] Patient Account Number: [id number] Date of Birth/Sex: 1931/09/21 (88 y.o. F) Treating RN: Levora Dredge Primary Care Jaritza Duignan: Kelton Pillar Other Clinician: Referring Taryn Nave: Kelton Pillar Treating Teigan Manner/Extender: Yaakov Guthrie in Treatment: 20 Vascular Assessment Pulses: Dorsalis Pedis Palpable: [Left:Yes] [Right:Yes] Electronic Signature(s) Signed: 05/16/2021 4:18:50 PM By: Levora Dredge Entered By: Levora Dredge on 05/16/2021 15:55:20 [name], [name] ([id number]) -------------------------------------------------------------------------------- Multi Wound Chart Details Patient Name: [name] Date of Service: 05/16/2021 3:15 PM Medical Record Number: [id number] Patient Account Number: [id number] Date of Birth/Sex: October 17, 1931 (88 y.o. F) Treating RN: Levora Dredge Primary Care Jatia Musa: Kelton Pillar Other Clinician: Referring Kyshawn Teal: Kelton Pillar Treating Sumaiyah Markert/Extender: Yaakov Guthrie in Treatment: 20 Vital Signs Height(in): 24 Pulse(bpm): 21 Weight(lbs): 196 Blood Pressure(mmHg): 139/74 Body Mass Index(BMI): 34.7 Temperature(F): 97.9 Respiratory Rate(breaths/min): 18 Photos: Wound Location: Right Calcaneus Left Calcaneus Left, Medial Malleolus Wounding Event: Pressure Injury Gradually Appeared Gradually Appeared Primary Etiology: Pressure Ulcer Pressure Ulcer Pressure Ulcer Comorbid  History: N/A Hypertension, Type II Diabetes, Hypertension, Type II Diabetes, Osteoarthritis, Dementia Osteoarthritis, Dementia Date Acquired: 12/25/2020 04/10/2021 04/10/2021 Weeks of Treatment: $RemoveBefor'20 4 4 'qCZoEmeMIZOo$ Wound Status: Healed - Epithelialized Open Open Wound Recurrence: No No No Measurements L x W x D (cm) 0x0x0 1x1.5x0.2 0.3x0.3x0.1 Area (cm) : 0 1.178 0.071 Volume (cm) : 0 0.236 0.007 % Reduction in Area: 100.00% -25.10% 74.20% % Reduction in Volume: 100.00% -25.50% 87.30% Classification: Unstageable/Unclassified Category/Stage III Category/Stage III Exudate Amount: Medium Medium Medium Exudate Type: Serosanguineous Serosanguineous Serosanguineous Exudate Color: red, brown red, brown red,  brown Granulation Amount: N/A None Present (0%) Small (1-33%) Granulation Quality: N/A N/A Red, Pink Necrotic Amount: N/A Large (67-100%) Large (67-100%) Epithelialization: N/A None None Wound Number: $RemoveBefor'5 6 7 'jPnzEnGciYhU$ Photos: Wound Location: Right, Medial Calcaneus Midline Gluteal fold Left Gluteus Wounding Event: Pressure Injury Pressure Injury Pressure Injury Primary Etiology: Pressure Ulcer Pressure Ulcer Pressure Ulcer Comorbid History: Hypertension, Type II Diabetes, Hypertension, Type II Diabetes, Hypertension, Type II Diabetes, Osteoarthritis, Dementia Osteoarthritis, Dementia Osteoarthritis, Dementia Date Acquired: 05/03/2021 05/03/2021 05/03/2021 Weeks of Treatment: $RemoveBefor'1 1 1 'uxRWcVfOTuQn$ Defilippo, Heylee E. ([id number]) Wound Status: Open Open Healed - Epithelialized Wound Recurrence: No No No Measurements L x W x D (cm) 0.3x0.3x0.1 0.4x0.5x0.1 0x0x0 Area (cm) : 0.071 0.157 0 Volume (cm) : 0.007 0.016 0 % Reduction in Area: 98.90% 60.10% 100.00% % Reduction in Volume: 98.90% 59.00% 100.00% Classification: Category/Stage II Category/Stage II Category/Stage II Exudate Amount: Medium Medium Medium Exudate Type: Serosanguineous Serosanguineous Serosanguineous Exudate Color: red, brown red, brown red, brown Granulation  Amount: None Present (0%) Medium (34-66%) Small (1-33%) Granulation Quality: N/A Pink, Pale Pink Necrotic Amount: Large (67-100%) Medium (34-66%) Large (67-100%) Exposed Structures: N/A Fat Layer (Subcutaneous Tissue): Fat Layer (Subcutaneous Tissue): Yes Yes Epithelialization: None None None Treatment Notes Electronic Signature(s) Signed: 05/16/2021 4:18:50 PM By: Levora Dredge Entered By: Levora Dredge on 05/16/2021 15:55:35 [name], [name] ([id number]) -------------------------------------------------------------------------------- Multi-Disciplinary Care Plan Details Patient Name: [name] Date of Service: 05/16/2021 3:15 PM Medical Record Number: [id number] Patient Account Number: [id number] Date of Birth/Sex: Jul 26, 1931 (88 y.o. F) Treating RN: Levora Dredge Primary Care Rayna Brenner: Kelton Pillar Other Clinician: Referring Keeley Sussman: Kelton Pillar Treating Odean Mcelwain/Extender: Yaakov Guthrie in Treatment: 20 Active Inactive Pressure Nursing Diagnoses: Knowledge deficit related to causes and risk factors for pressure ulcer development Knowledge deficit related to management of pressures ulcers Potential for impaired tissue integrity related to pressure, friction, moisture, and shear Goals: Patient will remain free from development of additional pressure ulcers Date Initiated: 12/25/2020 Target Resolution Date: 01/25/2021 Goal Status: Active Patient will remain free of pressure ulcers Date Initiated: 12/25/2020 Target Resolution Date: 01/25/2021 Goal Status: Active Patient/caregiver will verbalize risk factors for pressure ulcer development Date Initiated: 12/25/2020 Date Inactivated: 04/18/2021 Target Resolution Date: 12/25/2020 Goal Status: Met Patient/caregiver will verbalize understanding of pressure ulcer management Date Initiated: 12/25/2020 Date Inactivated: 04/18/2021 Target Resolution Date: 12/25/2020 Goal Status: Met Interventions: Assess:  immobility, friction, shearing, incontinence upon admission and as needed Assess offloading mechanisms upon admission and as needed Assess potential for pressure ulcer upon admission and as needed Provide education on pressure ulcers Notes: Wound/Skin Impairment Nursing Diagnoses: Impaired tissue integrity Goals: Patient/caregiver will verbalize understanding of skin care regimen Date Initiated: 12/25/2020 Date Inactivated: 01/25/2021 Target Resolution Date: 12/25/2020 Goal Status: Met Ulcer/skin breakdown will have a volume reduction of 30% by week 4 Date Initiated: 12/25/2020 Date Inactivated: 04/18/2021 Target Resolution Date: 01/25/2021 Goal Status: Unmet Unmet Reason: missed appt/con't tx Ulcer/skin breakdown will have a volume reduction of 50% by week 8 Date Initiated: 12/25/2020 Date Inactivated: 04/18/2021 Target Resolution Date: 02/24/2021 Goal Status: Unmet Unmet Reason: missed appt/con't tx Ulcer/skin breakdown will have a volume reduction of 80% by week 12 Date Initiated: 12/25/2020 Target Resolution Date: 03/27/2021 Goal Status: Active Ulcer/skin breakdown will heal within 14 weeks Date Initiated: 12/25/2020 Target Resolution Date: 04/27/2021 Goal Status: Active Interventions: Assess patient/caregiver ability to obtain necessary supplies [name], [name] ([id number]) Assess patient/caregiver ability to perform ulcer/skin care regimen upon admission and as needed Assess ulceration(s) every visit Provide education on ulcer and skin  care Treatment Activities: Referred to DME [facility] for dressing supplies : 12/25/2020 Skin care regimen initiated : 12/25/2020 Notes: Electronic Signature(s) Signed: 05/16/2021 4:18:50 PM By: Levora Dredge Entered By: Levora Dredge on 05/16/2021 15:55:26 [name], [name] ([id number]) -------------------------------------------------------------------------------- Pain Assessment Details Patient Name: [name] Date of Service:  05/16/2021 3:15 PM Medical Record Number: [id number] Patient Account Number: [id number] Date of Birth/Sex: October 08, 1931 (88 y.o. F) Treating RN: Levora Dredge Primary Care Vallory Oetken: Kelton Pillar Other Clinician: Referring Hadassa Cermak: Kelton Pillar Treating Mekel Haverstock/Extender: Yaakov Guthrie in Treatment: 20 Active Problems Location of Pain Severity and Description of Pain Patient Has Paino No Site Locations Rate the pain. Current Pain Level: 0 Pain Management and Medication Current Pain Management: Electronic Signature(s) Signed: 05/16/2021 4:18:50 PM By: Levora Dredge Entered By: Levora Dredge on 05/16/2021 15:25:48 [name], [name] ([id number]) -------------------------------------------------------------------------------- Patient/Caregiver Education Details Patient Name: [name] Date of Service: 05/16/2021 3:15 PM Medical Record Number: [id number] Patient Account Number: [id number] Date of Birth/Gender: 10-20-1931 (88 y.o. F) Treating RN: Levora Dredge Primary Care Physician: Kelton Pillar Other Clinician: Referring Physician: Kelton Pillar Treating Physician/Extender: Yaakov Guthrie in Treatment: 20 Education Assessment Education Provided To: Patient Education Topics Provided Wound/Skin Impairment: Handouts: Caring for Your Ulcer Methods: Explain/Verbal Responses: State content correctly Electronic Signature(s) Signed: 05/16/2021 4:18:50 PM By: Levora Dredge Entered By: Levora Dredge on 05/16/2021 16:09:36 [name], [name] ([id number]) -------------------------------------------------------------------------------- Wound Assessment Details Patient Name: [name] Date of Service: 05/16/2021 3:15 PM Medical Record Number: [id number] Patient Account Number: [id number] Date of Birth/Sex: 1931-05-22 (88 y.o. F) Treating RN: Levora Dredge Primary Care Rashawna Scoles: Kelton Pillar Other Clinician: Referring Regana Kemple: Kelton Pillar Treating  Shanieka Blea/Extender: Yaakov Guthrie in Treatment: 20 Wound Status Wound Number: 1 Primary Etiology: Pressure Ulcer Wound Location: Right Calcaneus Wound Status: Healed - Epithelialized Wounding Event: Pressure Injury Date Acquired: 12/25/2020 Weeks Of Treatment: 20 Clustered Wound: No Photos Photo Uploaded By: Levora Dredge on 05/16/2021 15:40:39 Wound Measurements Length: (cm) 0 Width: (cm) 0 Depth: (cm) 0 Area: (cm) 0 Volume: (cm) 0 % Reduction in Area: 100% % Reduction in Volume: 100% Wound Description Classification: Unstageable/Unclassified Exudate Amount: Medium Exudate Type: Serosanguineous Exudate Color: red, brown Treatment Notes Wound #1 (Calcaneus) Wound Laterality: Right Cleanser Peri-Wound Care Topical Primary Dressing Secondary Dressing Secured With Compression Wrap Compression Stockings Add-Ons [name], [name] Kitchen ([id number]) Electronic Signature(s) Signed: 05/16/2021 4:18:50 PM By: Levora Dredge Entered By: Levora Dredge on 05/16/2021 15:34:35 [name], [name] ([id number]) -------------------------------------------------------------------------------- Wound Assessment Details Patient Name: [name] Date of Service: 05/16/2021 3:15 PM Medical Record Number: [id number] Patient Account Number: [id number] Date of Birth/Sex: January 06, 1932 (88 y.o. F) Treating RN: Levora Dredge Primary Care Kacin Dancy: Kelton Pillar Other Clinician: Referring Ottilie Wigglesworth: Kelton Pillar Treating Peri Kreft/Extender: Yaakov Guthrie in Treatment: 20 Wound Status Wound Number: 3 Primary Etiology: Pressure Ulcer Wound Location: Left Calcaneus Wound Status: Open Wounding Event: Gradually Appeared Comorbid Hypertension, Type II Diabetes, Osteoarthritis, History: Dementia Date Acquired: 04/10/2021 Weeks Of Treatment: 4 Clustered Wound: No Photos Wound Measurements Length: (cm) 1 Width: (cm) 1.5 Depth: (cm) 0.2 Area: (cm) 1.178 Volume: (cm) 0.236 %  Reduction in Area: -25.1% % Reduction in Volume: -25.5% Epithelialization: None Wound Description Classification: Category/Stage III Exudate Amount: Medium Exudate Type: Serosanguineous Exudate Color: red, brown Foul Odor After Cleansing: No Slough/Fibrino Yes Wound Bed Granulation Amount: None Present (0%) Exposed Structure Necrotic Amount: Large (67-100%) Fascia Exposed: No Necrotic Quality: Adherent Slough Fat Layer (Subcutaneous Tissue) Exposed: Yes Tendon Exposed: No Muscle Exposed: No Joint Exposed: No  Bone Exposed: No Treatment Notes Wound #3 (Calcaneus) Wound Laterality: Left Cleanser Normal Saline Discharge Instruction: Wash your hands with soap and water. Remove old dressing, discard into plastic bag and place into trash. Cleanse the wound with Normal Saline prior to applying a clean dressing using gauze sponges, not tissues or cotton balls. Do not scrub or use excessive force. Pat dry using gauze sponges, not tissue or cotton balls. Kalman, Addysen E. ([id number]) Peri-Wound Care Topical Primary Dressing Iodosorb 40 (g) Discharge Instruction: Apply IodoSorb to wound bed only as directed. (BORDER) Zetuvit Plus SILICONE BORDER Dressing 5x5 (in/in) Discharge Instruction: Secure the collagen Secondary Dressing Secured With Compression Wrap Compression Stockings Add-Ons Electronic Signature(s) Signed: 05/16/2021 4:18:50 PM By: Levora Dredge Entered By: Levora Dredge on 05/16/2021 15:43:04 Achterberg, [name] ([id number]) -------------------------------------------------------------------------------- Wound Assessment Details Patient Name: [name] Date of Service: 05/16/2021 3:15 PM Medical Record Number: [id number] Patient Account Number: [id number] Date of Birth/Sex: 25-Jan-1932 (88 y.o. F) Treating RN: Levora Dredge Primary Care Emmalia Heyboer: Kelton Pillar Other Clinician: Referring Amiria Orrison: Kelton Pillar Treating Tami Blass/Extender: Yaakov Guthrie in  Treatment: 20 Wound Status Wound Number: 4 Primary Etiology: Pressure Ulcer Wound Location: Left, Medial Malleolus Wound Status: Open Wounding Event: Gradually Appeared Comorbid Hypertension, Type II Diabetes, Osteoarthritis, History: Dementia Date Acquired: 04/10/2021 Weeks Of Treatment: 4 Clustered Wound: No Photos Wound Measurements Length: (cm) 0.3 Width: (cm) 0.3 Depth: (cm) 0.1 Area: (cm) 0.071 Volume: (cm) 0.007 % Reduction in Area: 74.2% % Reduction in Volume: 87.3% Epithelialization: None Wound Description Classification: Category/Stage III Exudate Amount: Medium Exudate Type: Serosanguineous Exudate Color: red, brown Foul Odor After Cleansing: No Slough/Fibrino Yes Wound Bed Granulation Amount: Small (1-33%) Exposed Structure Granulation Quality: Red, Pink Fascia Exposed: No Necrotic Amount: Large (67-100%) Fat Layer (Subcutaneous Tissue) Exposed: Yes Necrotic Quality: Adherent Slough Tendon Exposed: No Muscle Exposed: No Joint Exposed: No Bone Exposed: No Treatment Notes Wound #4 (Malleolus) Wound Laterality: Left, Medial Cleanser Normal Saline Discharge Instruction: Wash your hands with soap and water. Remove old dressing, discard into plastic bag and place into trash. Cleanse the wound with Normal Saline prior to applying a clean dressing using gauze sponges, not tissues or cotton balls. Do not scrub or use excessive force. Pat dry using gauze sponges, not tissue or cotton balls. Ringstad, Tamsen E. ([id number]) Peri-Wound Care Topical Primary Dressing Iodosorb 40 (g) Discharge Instruction: Apply IodoSorb to wound bed only as directed. (BORDER) Zetuvit Plus SILICONE BORDER Dressing 5x5 (in/in) Discharge Instruction: Secure the collagen Secondary Dressing Secured With Compression Wrap Compression Stockings Add-Ons Electronic Signature(s) Signed: 05/16/2021 4:18:50 PM By: Levora Dredge Entered By: Levora Dredge on 05/16/2021 15:55:01 [place], [name]  ([id number]) -------------------------------------------------------------------------------- Wound Assessment Details Patient Name: [name] Date of Service: 05/16/2021 3:15 PM Medical Record Number: [id number] Patient Account Number: [id number] Date of Birth/Sex: 09-Dec-1931 (88 y.o. F) Treating RN: Levora Dredge Primary Care Mandisa Persinger: Kelton Pillar Other Clinician: Referring Udell Mazzocco: Kelton Pillar Treating Kalia Vahey/Extender: Yaakov Guthrie in Treatment: 20 Wound Status Wound Number: 5 Primary Etiology: Pressure Ulcer Wound Location: Right, Medial Calcaneus Wound Status: Open Wounding Event: Pressure Injury Comorbid Hypertension, Type II Diabetes, Osteoarthritis, History: Dementia Date Acquired: 05/03/2021 Weeks Of Treatment: 1 Clustered Wound: No Photos Wound Measurements Length: (cm) 0.3 Width: (cm) 0.3 Depth: (cm) 0.1 Area: (cm) 0.071 Volume: (cm) 0.007 % Reduction in Area: 98.9% % Reduction in Volume: 98.9% Epithelialization: None Wound Description Classification: Category/Stage II Exudate Amount: Medium Exudate Type: Serosanguineous Exudate Color: red, brown Foul Odor After Cleansing: No Slough/Fibrino Yes Wound  Bed Granulation Amount: None Present (0%) Necrotic Amount: Large (67-100%) Necrotic Quality: Adherent Slough Treatment Notes Wound #5 (Calcaneus) Wound Laterality: Right, Medial Cleanser Normal Saline Discharge Instruction: Wash your hands with soap and water. Remove old dressing, discard into plastic bag and place into trash. Cleanse the wound with Normal Saline prior to applying a clean dressing using gauze sponges, not tissues or cotton balls. Do not scrub or use excessive force. Pat dry using gauze sponges, not tissue or cotton balls. Peri-Wound Care Topical iodosord Rohleder, Yuri E. ([id number]) Primary Dressing (BORDER) Zetuvit Plus Silicone Border Dressing 4x4 (in/in) Secondary Dressing Secured With Compression  Wrap Compression Stockings Add-Ons Electronic Signature(s) Signed: 05/16/2021 4:18:50 PM By: Levora Dredge Entered By: Levora Dredge on 05/16/2021 15:39:42 Rovito, [name] ([id number]) -------------------------------------------------------------------------------- Wound Assessment Details Patient Name: [name] Date of Service: 05/16/2021 3:15 PM Medical Record Number: [id number] Patient Account Number: [id number] Date of Birth/Sex: 1931/05/24 (88 y.o. F) Treating RN: Levora Dredge Primary Care Ether Goebel: Kelton Pillar Other Clinician: Referring Treavon Castilleja: Kelton Pillar Treating Graysyn Bache/Extender: Yaakov Guthrie in Treatment: 20 Wound Status Wound Number: 6 Primary Etiology: Pressure Ulcer Wound Location: Midline Gluteal fold Wound Status: Open Wounding Event: Pressure Injury Comorbid Hypertension, Type II Diabetes, Osteoarthritis, History: Dementia Date Acquired: 05/03/2021 Weeks Of Treatment: 1 Clustered Wound: No Photos Wound Measurements Length: (cm) 0.4 Width: (cm) 0.5 Depth: (cm) 0.1 Area: (cm) 0.157 Volume: (cm) 0.016 % Reduction in Area: 60.1% % Reduction in Volume: 59% Epithelialization: None Wound Description Classification: Category/Stage II Exudate Amount: Medium Exudate Type: Serosanguineous Exudate Color: red, brown Foul Odor After Cleansing: No Slough/Fibrino Yes Wound Bed Granulation Amount: Medium (34-66%) Exposed Structure Granulation Quality: Pink, Pale Fat Layer (Subcutaneous Tissue) Exposed: Yes Necrotic Amount: Medium (34-66%) Necrotic Quality: Adherent Slough Treatment Notes Wound #6 (Gluteal fold) Wound Laterality: Midline Cleanser Normal Saline Discharge Instruction: Wash your hands with soap and water. Remove old dressing, discard into plastic bag and place into trash. Cleanse the wound with Normal Saline prior to applying a clean dressing using gauze sponges, not tissues or cotton balls. Do not scrub or use excessive  force. Pat dry using gauze sponges, not tissue or cotton balls. Wound Cleanser Discharge Instruction: Wash your hands with soap and water. Remove old dressing, discard into plastic bag and place into trash. Cleanse the wound with Wound Cleanser prior to applying a clean dressing using gauze sponges, not tissues or cotton balls. Do not Mcclenney, Paysen E. ([id number]) scrub or use excessive force. Pat dry using gauze sponges, not tissue or cotton balls. Peri-Wound Care Topical Primary Dressing Iodosorb 40 (g) Discharge Instruction: Apply IodoSorb to wound bed only as directed. Secondary Dressing (NON-BORDER) Zetuvit Plus Silicone NON-BORDER 5x5 (in/in) Secured With Compression Wrap Compression Stockings Add-Ons Electronic Signature(s) Signed: 05/16/2021 4:18:50 PM By: Levora Dredge Entered By: Levora Dredge on 05/16/2021 15:49:50 Zachery, [name] ([id number]) -------------------------------------------------------------------------------- Wound Assessment Details Patient Name: [name] Date of Service: 05/16/2021 3:15 PM Medical Record Number: [id number] Patient Account Number: [id number] Date of Birth/Sex: February 25, 1932 (88 y.o. F) Treating RN: Levora Dredge Primary Care Omaira Mellen: Kelton Pillar Other Clinician: Referring Adjoa Althouse: Kelton Pillar Treating Viveka Wilmeth/Extender: Yaakov Guthrie in Treatment: 20 Wound Status Wound Number: 7 Primary Etiology: Pressure Ulcer Wound Location: Left Gluteus Wound Status: Healed - Epithelialized Wounding Event: Pressure Injury Comorbid Hypertension, Type II Diabetes, Osteoarthritis, History: Dementia Date Acquired: 05/03/2021 Weeks Of Treatment: 1 Clustered Wound: No Photos Wound Measurements Length: (cm) 0 % Re Width: (cm) 0 % Re Depth: (cm) 0 Epit Area: (cm)  0 Volume: (cm) 0 duction in Area: 100% duction in Volume: 100% helialization: None Wound Description Classification: Category/Stage II Fou Exudate Amount: Medium  Slo Exudate Type: Serosanguineous Exudate Color: red, brown l Odor After Cleansing: No ugh/Fibrino Yes Wound Bed Granulation Amount: Small (1-33%) Exposed Structure Granulation Quality: Pink Fat Layer (Subcutaneous Tissue) Exposed: Yes Necrotic Amount: Large (67-100%) Treatment Notes Wound #7 (Gluteus) Wound Laterality: Left Cleanser Peri-Wound Care Topical Primary Dressing Secondary Dressing Secured With Nield, Deliyah E. ([id number]) Compression Wrap Compression Stockings Add-Ons Electronic Signature(s) Signed: 05/16/2021 4:18:50 PM By: Levora Dredge Entered By: Levora Dredge on 05/16/2021 15:50:57 Lusher, [name] ([id number]) -------------------------------------------------------------------------------- Vitals Details Patient Name: [name] Date of Service: 05/16/2021 3:15 PM Medical Record Number: [id number] Patient Account Number: [id number] Date of Birth/Sex: [birth year] (88 y.o. F) Treating RN: Levora Dredge Primary Care Geniyah Eischeid: Kelton Pillar Other Clinician: Referring Kodiak Rollyson: Kelton Pillar Treating Nardos Putnam/Extender: Yaakov Guthrie in Treatment: 20 Vital Signs Time Taken: 15:20 Temperature (F): 97.9 Height (in): 63 Pulse (bpm): 73 Weight (lbs): 196 Respiratory Rate (breaths/min): 18 Body Mass Index (BMI): 34.7 Blood Pressure (mmHg): 139/74 Reference Range: 80 - 120 mg / dl Electronic Signature(s) Signed: 05/16/2021 4:18:50 PM By: Levora Dredge Entered By: Levora Dredge on 05/16/2021 15:24:23

## 2021-05-16 NOTE — Progress Notes (Addendum)
Melnik, ZORINA MALLIN ([id number]) Visit Report for 05/16/2021 Chief Complaint Document Details Patient Name: [name], [name]. Date of Service: 05/16/2021 3:15 PM Medical Record Number: [id number] Patient Account Number: [id number] Date of Birth/Sex: [birth year] (88 y.o. F) Treating RN: Levora Dredge Primary Care Provider: Kelton Pillar Other Clinician: Referring Provider: Kelton Pillar Treating Provider/Extender: Yaakov Guthrie in Treatment: 20 Information Obtained from: Patient Chief Complaint Right heel pressure, left heel pressure ulcer, left ankle pressure ulcer, and bilateral gluteal pressure ulcers Electronic Signature(s) Signed: 05/16/2021 4:04:40 PM By: Kalman Shan DO Entered By: Kalman Shan on 05/16/2021 16:00:32 Monks, [name] ([id number]) -------------------------------------------------------------------------------- HPI Details Patient Name: [name] Date of Service: 05/16/2021 3:15 PM Medical Record Number: [id number] Patient Account Number: [id number] Date of Birth/Sex: 08/26/1931 (88 y.o. F) Treating RN: Levora Dredge Primary Care Provider: Kelton Pillar Other Clinician: Referring Provider: Kelton Pillar Treating Provider/Extender: Yaakov Guthrie in Treatment: 20 History of Present Illness HPI Description: 12/25/20 upon evaluation today patient presents for a wound on her heel which has been present for about 1 and half months or so according to her daughter. The patient does have dementia. With that being said her daughter is the primary caregiver and seems to be doing an excellent job. She had a fairly significant wound on the heel with eschar covering however this was lifting up and it appears to be mostly healed underneath the biggest issue is on the posterior aspect of the heel where she has a small slit of an opening in the Achilles region which is actually what is mainly painful for her today. We do not have a hemoglobin A1c as the patient  actually is seen by a provider outside of the [facility] system so I am not able to look this up. With that being said I do not see any evidence of active infection and I do think that she is actually doing quite well I think her daughter is doing a great job taking care of the wound in particular. With regard to her past medical history she does have dementia and diabetes mellitus type 2 but otherwise no major medical problems. She did have an x-ray performed 11/27/2020 which was negative for any signs of osteomyelitis. 01/03/2021 upon evaluation today patient appears to be doing well with regard to her heel ulcer. She has been tolerating the dressing changes without complication the collagen seems to be doing a good job for her. I do not see any signs of infection at this point. Unfortunately the patient is stated to have a wound on her bottom although her daughter is not sure how bad this really is next week we will get a get her into a room with a bed so that we can actually check this out I do not want things to get worse unnecessarily. 01/10/2021 upon evaluation today patient's wound actually appears to be doing well in regard to her heel there is some slough noted I Georgina Peer clear this away with some sharp debridement today. With regard to the gluteal region there is some abrasion noted here. This seems to be very superficial and I think it is from her scratching mainly they have been using Desitin I think she could benefit from possibly utilizing some AandE ointment which I think could be of benefit for her. Fortunately there does not appear to be any signs of active infection systemically which is great news. 01/17/2021 upon evaluation today patient's wound on the gluteal region actually appears to be healed and everything is  doing quite well. I am very pleased in that regard. With regard to the wound on her heel that is measuring smaller and looking much better and very pleased with what I see as well  today. 01/25/2021 upon evaluation today patient appears to be doing well in regard to her wounds. She has been actually healing quite nicely in regard to the heel and the gluteal region also seems to be doing quite well. I do not see any evidence of infection which is great. She had several areas of scratching that she had been messing with but again nothing that appears to be open at the this point. 02/15/2021 upon evaluation patient's wounds actually appear to be doing decently well. At this point I am still concerned about the pressure ulcer on her right heel. This does have some eschar overlying I Georgina Peer try to clear this away I am unsure how much is really open underneath though there appears to be some deep tissue injury around as well. Her daughter who generally takes care of her actually unfortunately had a stroke she is with her today but nonetheless is not able to do as much and does not remember as much about what is been going on treatment wise as she otherwise would. 02/22/2021 upon evaluation today patient's wound on the heel actually was doing excellent. Unfortunately she does have a deep tissue injury on the left heel which is not good and not a good sign. This is something her daughter needs to keep a close eye on we do not want this to get worse at all. In fact I am hoping it will not even reopen up completely but again time will tell we never know until it is all said and done. 04/18/2021 upon evaluation today patient unfortunately has not been seen in about 2 months. Nonetheless we have been trying to get her in they have been scheduling but have had issues with transportation that was in the no way related to being their fault. Nonetheless this has still continued to be an ongoing issue. 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 The good news is they have figured out a new way to get here through using the [facility] health transportation which is good to be definitely helpful for them. With  that being said the patient has 2 new wounds on the left heel and left ankle which were not there when I saw her last. The ankle region appears to potentially be infected we will get obtain a wound culture today. 05/03/2021 upon evaluation today patient appears to be doing really not much better in regard to her wounds. With that being said there are dressings that she had and placed today were actually the same dressings that the patient was placed in on February 9 when we last saw her. With that being said we had sent orders to home health to have this changed on a regular basis 3 times a week but apparently according to home health the daughter told them that the dressings we put her on were supposed to stay in place until she came back. Again that obviously was really not what we were looking for at all. Nonetheless I think we need to get this straightened out since apparently the nursing side of this has been discontinued at this point. 3/9; patient presents for follow-up. She has no issues or complaints today. Daughter is present and helps provide the history. Home health has not been coming out to do dressing changes. Electronic Signature(s)  Signed: 05/16/2021 4:04:40 PM By: Kalman Shan DO Entered By: Kalman Shan on 05/16/2021 16:00:56 Mumaw, [name] ([id number]) -------------------------------------------------------------------------------- Physical Exam Details Patient Name: [name] Date of Service: 05/16/2021 3:15 PM Medical Record Number: [id number] Patient Account Number: [id number] Date of Birth/Sex: 13-Nov-1931 (88 y.o. F) Treating RN: Levora Dredge Primary Care Provider: Kelton Pillar Other Clinician: Referring Provider: Kelton Pillar Treating Provider/Extender: Yaakov Guthrie in Treatment: 40 Constitutional . Cardiovascular . Psychiatric . Notes For remaining wounds. These are located to the right and left foot along with the midline gluteal fold.  They all have granulation tissue and nonviable tissue present. No surrounding signs of infection Electronic Signature(s) Signed: 05/16/2021 4:04:40 PM By: Kalman Shan DO Entered By: Kalman Shan on 05/16/2021 16:01:43 Blount, [name] ([id number]) -------------------------------------------------------------------------------- Physician Orders Details Patient Name: [name] Date of Service: 05/16/2021 3:15 PM Medical Record Number: [id number] Patient Account Number: [id number] Date of Birth/Sex: 03-Oct-1931 (88 y.o. F) Treating RN: Levora Dredge Primary Care Provider: Kelton Pillar Other Clinician: Referring Provider: Kelton Pillar Treating Provider/Extender: Yaakov Guthrie in Treatment: 20 Verbal / Phone Orders: No Diagnosis Coding Follow-up Appointments o Return Appointment in 1 week. Oter[facility] Lyman for wound care. May utilize formulary equivalent dressing for wound treatment orders unless otherwise specified. Home Health Nurse may visit PRN to address patientos wound care needs. o Scheduled days for dressing changes to be completed; exception, patient has scheduled wound care visit that day. - 3 x week o **Please direct any NON-WOUND related issues/requests for orders to patient's Primary Care Physician. **If current dressing causes regression in wound condition, may D/C ordered dressing product/s and apply Normal Saline Moist Dressing daily until next [place] or Other MD appointment. **Notify [facility] of regression in wound condition at [phone]. Bathing/ Shower/ Hygiene o May shower; gently cleanse wound with antibacterial soap, rinse and pat dry prior to dressing wounds Anesthetic (Use 'Patient Medications' Section for Anesthetic Order Entry) o Lidocaine applied to wound bed Non-Wound Condition o Additional non-wound  orders/instructions: - skin protective wipe to any DTI Off-Loading o Gel wheelchair cushion - Recommended o Low air-loss mattress (Group 2) o Turn and reposition every 2 hours - reposition every 2 hrs to prevent skin breakdown o Other: - Patient to purchase slippers with rubber soles. Education provided on measures to decrease scratching. Keeping heels protected. Recommend Prafo boot in bed available on the internet Additional Orders / Instructions o Follow Nutritious Diet and Increase Protein Intake Wound Treatment Wound #3 - Calcaneus Wound Laterality: Left Cleanser: Normal Saline 3 x Per Week/30 Days Discharge Instructions: Wash your hands with soap and water. Remove old dressing, discard into plastic bag and place into trash. Cleanse the wound with Normal Saline prior to applying a clean dressing using gauze sponges, not tissues or cotton balls. Do not scrub or use excessive force. Pat dry using gauze sponges, not tissue or cotton balls. Primary Dressing: Iodosorb 40 (g) 3 x Per Week/30 Days Discharge Instructions: Apply IodoSorb to wound bed only as directed. Primary Dressing: (BORDER) Zetuvit Plus SILICONE BORDER Dressing 5x5 (in/in) (Generic) 3 x Per Week/30 Days Discharge Instructions: Secure the collagen Wound #4 - Malleolus Wound Laterality: Left, Medial Cleanser: Normal Saline 3 x Per Week/30 Days Discharge Instructions: Wash your hands with soap and water. Remove old dressing, discard into plastic bag and place into trash. Cleanse the wound with Normal Saline prior to applying  a clean dressing using gauze sponges, not tissues or cotton balls. Do not scrub or use excessive force. Pat dry using gauze sponges, not tissue or cotton balls. Primary Dressing: Iodosorb 40 (g) 3 x Per Week/30 Days Discharge Instructions: Apply IodoSorb to wound bed only as directed. Primary Dressing: (BORDER) Zetuvit Plus SILICONE BORDER Dressing 5x5 (in/in) (Generic) 3 x Per Week/30  Days Discharge Instructions: Secure the collagen Hollars, Francely E. ([id number]) Wound #5 - Calcaneus Wound Laterality: Right, Medial Cleanser: Normal Saline 3 x Per Week/30 Days Discharge Instructions: Wash your hands with soap and water. Remove old dressing, discard into plastic bag and place into trash. Cleanse the wound with Normal Saline prior to applying a clean dressing using gauze sponges, not tissues or cotton balls. Do not scrub or use excessive force. Pat dry using gauze sponges, not tissue or cotton balls. Topical: iodosord 3 x Per Week/30 Days Primary Dressing: (BORDER) Zetuvit Plus Silicone Border Dressing 4x4 (in/in) 3 x Per Week/30 Days Wound #6 - Gluteal fold Wound Laterality: Midline Cleanser: Normal Saline 3 x Per Week/30 Days Discharge Instructions: Wash your hands with soap and water. Remove old dressing, discard into plastic bag and place into trash. Cleanse the wound with Normal Saline prior to applying a clean dressing using gauze sponges, not tissues or cotton balls. Do not scrub or use excessive force. Pat dry using gauze sponges, not tissue or cotton balls. Cleanser: Wound Cleanser 3 x Per Week/30 Days Discharge Instructions: Wash your hands with soap and water. Remove old dressing, discard into plastic bag and place into trash. Cleanse the wound with Wound Cleanser prior to applying a clean dressing using gauze sponges, not tissues or cotton balls. Do not scrub or use excessive force. Pat dry using gauze sponges, not tissue or cotton balls. Primary Dressing: Iodosorb 40 (g) 3 x Per Week/30 Days Discharge Instructions: Apply IodoSorb to wound bed only as directed. Secondary Dressing: (SILICONE BORDER) Zetuvit Plus SILICONE BORDER Dressing 4x4 (in/in) 3 x Per Week/30 Days Discharge Instructions: Please do not put silicone bordered dressings under wraps. Use non-bordered dressing only. Electronic Signature(s) Signed: 05/16/2021 4:18:50 PM By: Levora Dredge Signed:  05/20/2021 9:05:59 AM By: Kalman Shan DO Previous Signature: 05/16/2021 4:04:40 PM Version By: Kalman Shan DO Entered By: Levora Dredge on 05/16/2021 16:15:29 Buzzell, [name] ([id number]) -------------------------------------------------------------------------------- Problem List Details Patient Name: [name] Date of Service: 05/16/2021 3:15 PM Medical Record Number: [id number] Patient Account Number: [id number] Date of Birth/Sex: 08-07-1931 (88 y.o. F) Treating RN: Levora Dredge Primary Care Provider: Kelton Pillar Other Clinician: Referring Provider: Kelton Pillar Treating Provider/Extender: Yaakov Guthrie in Treatment: 20 Active Problems ICD-10 Encounter Code Description Active Date MDM Diagnosis L89.613 Pressure ulcer of right heel, stage 3 12/25/2020 No Yes L89.623 Pressure ulcer of left heel, stage 3 04/18/2021 No Yes L89.893 Pressure ulcer of other site, stage 3 04/18/2021 No Yes L89.322 Pressure ulcer of left buttock, stage 2 05/03/2021 No Yes L89.312 Pressure ulcer of right buttock, stage 2 05/03/2021 No Yes E11.622 Type 2 diabetes mellitus with other skin ulcer 12/25/2020 No Yes F01.50 Vascular dementia without behavioral disturbance 12/25/2020 No Yes Inactive Problems Resolved Problems ICD-10 Code Description Active Date Resolved Date S30.810A Abrasion of lower back and pelvis, initial encounter 01/10/2021 01/10/2021 Electronic Signature(s) Signed: 05/16/2021 4:04:40 PM By: Kalman Shan DO Entered By: Kalman Shan on 05/16/2021 16:00:23 Broski, [name] ([id number]) -------------------------------------------------------------------------------- Progress Note Details Patient Name: [name] Date of Service: 05/16/2021 3:15 PM Medical Record Number: [id number] Patient Account  Number: [id number] Date of Birth/Sex: 07/19/1931 (88 y.o. F) Treating RN: Levora Dredge Primary Care Provider: Kelton Pillar Other Clinician: Referring Provider: Kelton Pillar Treating Provider/Extender: Yaakov Guthrie in Treatment: 20 Subjective Chief Complaint Information obtained from Patient Right heel pressure, left heel pressure ulcer, left ankle pressure ulcer, and bilateral gluteal pressure ulcers History of Present Illness (HPI) 12/25/20 upon evaluation today patient presents for a wound on her heel which has been present for about 1 and half months or so according to her daughter. The patient does have dementia. With that being said her daughter is the primary caregiver and seems to be doing an excellent job. She had a fairly significant wound on the heel with eschar covering however this was lifting up and it appears to be mostly healed underneath the biggest issue is on the posterior aspect of the heel where she has a small slit of an opening in the Achilles region which is actually what is mainly painful for her today. We do not have a hemoglobin A1c as the patient actually is seen by a provider outside of the [facility] system so I am not able to look this up. With that being said I do not see any evidence of active infection and I do think that she is actually doing quite well I think her daughter is doing a great job taking care of the wound in particular. With regard to her past medical history she does have dementia and diabetes mellitus type 2 but otherwise no major medical problems. She did have an x-ray performed 11/27/2020 which was negative for any signs of osteomyelitis. 01/03/2021 upon evaluation today patient appears to be doing well with regard to her heel ulcer. She has been tolerating the dressing changes without complication the collagen seems to be doing a good job for her. I do not see any signs of infection at this point. Unfortunately the patient is stated to have a wound on her bottom although her daughter is not sure how bad this really is next week we will get a get her into a room with a bed so that we can actually check  this out I do not want things to get worse unnecessarily. 01/10/2021 upon evaluation today patient's wound actually appears to be doing well in regard to her heel there is some slough noted I Georgina Peer clear this away with some sharp debridement today. With regard to the gluteal region there is some abrasion noted here. This seems to be very superficial and I think it is from her scratching mainly they have been using Desitin I think she could benefit from possibly utilizing some AandE ointment which I think could be of benefit for her. Fortunately there does not appear to be any signs of active infection systemically which is great news. 01/17/2021 upon evaluation today patient's wound on the gluteal region actually appears to be healed and everything is doing quite well. I am very pleased in that regard. With regard to the wound on her heel that is measuring smaller and looking much better and very pleased with what I see as well today. 01/25/2021 upon evaluation today patient appears to be doing well in regard to her wounds. She has been actually healing quite nicely in regard to the heel and the gluteal region also seems to be doing quite well. I do not see any evidence of infection which is great. She had several areas of scratching that she had been messing with but again nothing that  appears to be open at the this point. 02/15/2021 upon evaluation patient's wounds actually appear to be doing decently well. At this point I am still concerned about the pressure ulcer on her right heel. This does have some eschar overlying I Georgina Peer try to clear this away I am unsure how much is really open underneath though there appears to be some deep tissue injury around as well. Her daughter who generally takes care of her actually unfortunately had a stroke she is with her today but nonetheless is not able to do as much and does not remember as much about what is been going on treatment wise as she otherwise  would. 02/22/2021 upon evaluation today patient's wound on the heel actually was doing excellent. Unfortunately she does have a deep tissue injury on the left heel which is not good and not a good sign. This is something her daughter needs to keep a close eye on we do not want this to get worse at all. In fact I am hoping it will not even reopen up completely but again time will tell we never know until it is all said and done. 04/18/2021 upon evaluation today patient unfortunately has not been seen in about 2 months. Nonetheless we have been trying to get her in they have been scheduling but have had issues with transportation that was in the no way related to being their fault. Nonetheless this has still continued to be an ongoing issue. 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 The good news is they have figured out a new way to get here through using the [facility] health transportation which is good to be definitely helpful for them. With that being said the patient has 2 new wounds on the left heel and left ankle which were not there when I saw her last. The ankle region appears to potentially be infected we will get obtain a wound culture today. 05/03/2021 upon evaluation today patient appears to be doing really not much better in regard to her wounds. With that being said there are dressings that she had and placed today were actually the same dressings that the patient was placed in on February 9 when we last saw her. With that being said we had sent orders to home health to have this changed on a regular basis 3 times a week but apparently according to home health the daughter told them that the dressings we put her on were supposed to stay in place until she came back. Again that obviously was really not what we were looking for at all. Nonetheless I think we need to get this straightened out since apparently the nursing side of this has been discontinued at this point. 3/9; patient presents for  follow-up. She has no issues or complaints today. Daughter is present and helps provide the history. Home health has not been coming out to do dressing changes. Dexter, Patina E. ([id number]) Objective Constitutional Vitals Time Taken: 3:20 PM, Height: 63 in, Weight: 196 lbs, BMI: 34.7, Temperature: 97.9 F, Pulse: 73 bpm, Respiratory Rate: 18 breaths/min, Blood Pressure: 139/74 mmHg. General Notes: For remaining wounds. These are located to the right and left foot along with the midline gluteal fold. They all have granulation tissue and nonviable tissue present. No surrounding signs of infection Integumentary (Hair, Skin) Wound #1 status is Healed - Epithelialized. Original cause of wound was Pressure Injury. The date acquired was: 12/25/2020. The wound has been in treatment 20 weeks. The wound is located on the  Right Calcaneus. The wound measures 0cm length x 0cm width x 0cm depth; 0cm^2 area and 0cm^3 volume. There is a medium amount of serosanguineous drainage noted. Wound #3 status is Open. Original cause of wound was Gradually Appeared. The date acquired was: 04/10/2021. The wound has been in treatment 4 weeks. The wound is located on the Left Calcaneus. The wound measures 1cm length x 1.5cm width x 0.2cm depth; 1.178cm^2 area and 0.236cm^3 volume. There is Fat Layer (Subcutaneous Tissue) exposed. There is a medium amount of serosanguineous drainage noted. There is no granulation within the wound bed. There is a large (67-100%) amount of necrotic tissue within the wound bed including Adherent Slough. Wound #4 status is Open. Original cause of wound was Gradually Appeared. The date acquired was: 04/10/2021. The wound has been in treatment 4 weeks. The wound is located on the Left,Medial Malleolus. The wound measures 0.3cm length x 0.3cm width x 0.1cm depth; 0.071cm^2 area and 0.007cm^3 volume. There is Fat Layer (Subcutaneous Tissue) exposed. There is a medium amount of serosanguineous drainage  noted. There is small (1-33%) red, pink granulation within the wound bed. There is a large (67-100%) amount of necrotic tissue within the wound bed including Adherent Slough. Wound #5 status is Open. Original cause of wound was Pressure Injury. The date acquired was: 05/03/2021. The wound has been in treatment 1 weeks. The wound is located on the Right,Medial Calcaneus. The wound measures 0.3cm length x 0.3cm width x 0.1cm depth; 0.071cm^2 area and 0.007cm^3 volume. There is a medium amount of serosanguineous drainage noted. There is no granulation within the wound bed. There is a large (67-100%) amount of necrotic tissue within the wound bed including Adherent Slough. Wound #6 status is Open. Original cause of wound was Pressure Injury. The date acquired was: 05/03/2021. The wound has been in treatment 1 weeks. The wound is located on the Midline Gluteal fold. The wound measures 0.4cm length x 0.5cm width x 0.1cm depth; 0.157cm^2 area and 0.016cm^3 volume. There is Fat Layer (Subcutaneous Tissue) exposed. There is a medium amount of serosanguineous drainage noted. There is medium (34-66%) pink, pale granulation within the wound bed. There is a medium (34-66%) amount of necrotic tissue within the wound bed including Adherent Slough. Wound #7 status is Healed - Epithelialized. Original cause of wound was Pressure Injury. The date acquired was: 05/03/2021. The wound has been in treatment 1 weeks. The wound is located on the Left Gluteus. The wound measures 0cm length x 0cm width x 0cm depth; 0cm^2 area and 0cm^3 volume. There is Fat Layer (Subcutaneous Tissue) exposed. There is a medium amount of serosanguineous drainage noted. There is small (1-33%) pink granulation within the wound bed. There is a large (67-100%) amount of necrotic tissue within the wound bed. Assessment Active Problems ICD-10 Pressure ulcer of right heel, stage 3 Pressure ulcer of left heel, stage 3 Pressure ulcer of other site,  stage 3 Pressure ulcer of left buttock, stage 2 Pressure ulcer of right buttock, stage 2 Type 2 diabetes mellitus with other skin ulcer Vascular dementia without behavioral disturbance Patient has healed 2 wounds. No signs of infection on exam. I recommended doing Iodoflex to her remaining wounds. We will send orders to home health. Follow-up in 1 week. Plan Gerrard, Burns ([id number]) Follow-up Appointments: Return Appointment in 1 week. Home Health: [facility] [phone] [place] for wound care. May utilize formulary equivalent dressing for wound treatment orders unless otherwise specified. Home  Health Nurse may visit PRN to address patient s wound care needs. Scheduled days for dressing changes to be completed; exception, patient has scheduled wound care visit that day. - 3 x week **Please direct any NON-WOUND related issues/requests for orders to patient's Primary Care Physician. **If current dressing causes regression in wound condition, may D/C ordered dressing product/s and apply Normal Saline Moist Dressing daily until next [place] or Other MD appointment. **Notify [facility] of regression in wound condition at [phone]. Bathing/ Shower/ Hygiene: May shower; gently cleanse wound with antibacterial soap, rinse and pat dry prior to dressing wounds Anesthetic (Use 'Patient Medications' Section for Anesthetic Order Entry): Lidocaine applied to wound bed Non-Wound Condition: Additional non-wound orders/instructions: - skin protective wipe to any DTI Off-Loading: Gel wheelchair cushion - Recommended Low air-loss mattress (Group 2) Turn and reposition every 2 hours - reposition every 2 hrs to prevent skin breakdown Other: - Patient to purchase slippers with rubber soles. Education provided on measures to decrease scratching. Keeping heels protected. Recommend Prafo boot in bed available on the  internet Additional Orders / Instructions: Follow Nutritious Diet and Increase Protein Intake WOUND #3: - Calcaneus Wound Laterality: Left Cleanser: Normal Saline 3 x Per Week/30 Days Discharge Instructions: Wash your hands with soap and water. Remove old dressing, discard into plastic bag and place into trash. Cleanse the wound with Normal Saline prior to applying a clean dressing using gauze sponges, not tissues or cotton balls. Do not scrub or use excessive force. Pat dry using gauze sponges, not tissue or cotton balls. Primary Dressing: Iodosorb 40 (g) 3 x Per Week/30 Days Discharge Instructions: Apply IodoSorb to wound bed only as directed. Primary Dressing: (BORDER) Zetuvit Plus SILICONE BORDER Dressing 5x5 (in/in) (Generic) 3 x Per Week/30 Days Discharge Instructions: Secure the collagen WOUND #4: - Malleolus Wound Laterality: Left, Medial Cleanser: Normal Saline 3 x Per Week/30 Days Discharge Instructions: Wash your hands with soap and water. Remove old dressing, discard into plastic bag and place into trash. Cleanse the wound with Normal Saline prior to applying a clean dressing using gauze sponges, not tissues or cotton balls. Do not scrub or use excessive force. Pat dry using gauze sponges, not tissue or cotton balls. Primary Dressing: Iodosorb 40 (g) 3 x Per Week/30 Days Discharge Instructions: Apply IodoSorb to wound bed only as directed. Primary Dressing: (BORDER) Zetuvit Plus SILICONE BORDER Dressing 5x5 (in/in) (Generic) 3 x Per Week/30 Days Discharge Instructions: Secure the collagen WOUND #5: - Calcaneus Wound Laterality: Right, Medial Cleanser: Normal Saline 3 x Per Week/30 Days Discharge Instructions: Wash your hands with soap and water. Remove old dressing, discard into plastic bag and place into trash. Cleanse the wound with Normal Saline prior to applying a clean dressing using gauze sponges, not tissues or cotton balls. Do not scrub or use excessive force. Pat dry  using gauze sponges, not tissue or cotton balls. Topical: Betadine 3 x Per Week/30 Days Discharge Instructions: Apply betadine as directed. WOUND #6: - Gluteal fold Wound Laterality: Midline Cleanser: Normal Saline 3 x Per Week/30 Days Discharge Instructions: Wash your hands with soap and water. Remove old dressing, discard into plastic bag and place into trash. Cleanse the wound with Normal Saline prior to applying a clean dressing using gauze sponges, not tissues or cotton balls. Do not scrub or use excessive force. Pat dry using gauze sponges, not tissue or cotton balls. Cleanser: Wound Cleanser 3 x Per Week/30 Days Discharge Instructions: Wash your hands with soap and  water. Remove old dressing, discard into plastic bag and place into trash. Cleanse the wound with Wound Cleanser prior to applying a clean dressing using gauze sponges, not tissues or cotton balls. Do not scrub or use excessive force. Pat dry using gauze sponges, not tissue or cotton balls. Primary Dressing: Iodosorb 40 (g) 3 x Per Week/30 Days Discharge Instructions: Apply IodoSorb to wound bed only as directed. Secondary Dressing: (NON-BORDER) Zetuvit Plus Silicone NON-BORDER 5x5 (in/in) 3 x Per Week/30 Days 1. Iodoflex 2. Follow-up in 1 week Electronic Signature(s) Signed: 05/16/2021 4:04:40 PM By: Kalman Shan DO Entered By: Kalman Shan on 05/16/2021 16:02:53 Trotta, [name] ([id number]) -------------------------------------------------------------------------------- SuperBill Details Patient Name: [name] Date of Service: 05/16/2021 Medical Record Number: [id number] Patient Account Number: [id number] Date of Birth/Sex: 19-Jan-1932 (88 y.o. F) Treating RN: Levora Dredge Primary Care Provider: Kelton Pillar Other Clinician: Referring Provider: Kelton Pillar Treating Provider/Extender: Yaakov Guthrie in Treatment: 20 Diagnosis Coding ICD-10 Codes Code Description [phone] Pressure ulcer of  right heel, stage 3 L89.623 Pressure ulcer of left heel, stage 3 L89.893 Pressure ulcer of other site, stage 3 L89.322 Pressure ulcer of left buttock, stage 2 L89.312 Pressure ulcer of right buttock, stage 2 E11.622 Type 2 diabetes mellitus with other skin ulcer Facility Procedures CPT4 Code: [id number] Description: 99214 - WOUND CARE VISIT-LEV 4 EST PT Modifier: Quantity: 1 Physician Procedures CPT4 Code: 6629476 Description: 54650 - WC PHYS LEVEL 3 - EST PT Modifier: Quantity: 1 CPT4 Code: Description: ICD-10 Diagnosis Description L89.613 Pressure ulcer of right heel, stage 3 L89.623 Pressure ulcer of left heel, stage 3 L89.893 Pressure ulcer of other site, stage 3 E11.622 Type 2 diabetes mellitus with other skin ulcer Modifier: Quantity: Electronic Signature(s) Signed: 05/16/2021 4:09:23 PM By: Levora Dredge Signed: 05/20/2021 9:05:59 AM By: Kalman Shan DO Previous Signature: 05/16/2021 4:04:40 PM Version By: Kalman Shan DO Entered By: Levora Dredge on 05/16/2021 16:09:23

## 2021-05-17 ENCOUNTER — Ambulatory Visit: Payer: Medicare Other | Admitting: Physician Assistant

## 2021-05-19 DIAGNOSIS — Z7984 Long term (current) use of oral hypoglycemic drugs: Secondary | ICD-10-CM | POA: Diagnosis not present

## 2021-05-19 DIAGNOSIS — N6019 Diffuse cystic mastopathy of unspecified breast: Secondary | ICD-10-CM | POA: Diagnosis not present

## 2021-05-19 DIAGNOSIS — L89612 Pressure ulcer of right heel, stage 2: Secondary | ICD-10-CM | POA: Diagnosis not present

## 2021-05-19 DIAGNOSIS — Z4800 Encounter for change or removal of nonsurgical wound dressing: Secondary | ICD-10-CM | POA: Diagnosis not present

## 2021-05-19 DIAGNOSIS — E7800 Pure hypercholesterolemia, unspecified: Secondary | ICD-10-CM | POA: Diagnosis not present

## 2021-05-19 DIAGNOSIS — E049 Nontoxic goiter, unspecified: Secondary | ICD-10-CM | POA: Diagnosis not present

## 2021-05-19 DIAGNOSIS — N189 Chronic kidney disease, unspecified: Secondary | ICD-10-CM | POA: Diagnosis not present

## 2021-05-19 DIAGNOSIS — K2270 Barrett's esophagus without dysplasia: Secondary | ICD-10-CM | POA: Diagnosis not present

## 2021-05-19 DIAGNOSIS — K5790 Diverticulosis of intestine, part unspecified, without perforation or abscess without bleeding: Secondary | ICD-10-CM | POA: Diagnosis not present

## 2021-05-19 DIAGNOSIS — Z9181 History of falling: Secondary | ICD-10-CM | POA: Diagnosis not present

## 2021-05-19 DIAGNOSIS — H409 Unspecified glaucoma: Secondary | ICD-10-CM | POA: Diagnosis not present

## 2021-05-19 DIAGNOSIS — I10 Essential (primary) hypertension: Secondary | ICD-10-CM | POA: Diagnosis not present

## 2021-05-19 DIAGNOSIS — D126 Benign neoplasm of colon, unspecified: Secondary | ICD-10-CM | POA: Diagnosis not present

## 2021-05-19 DIAGNOSIS — Z87891 Personal history of nicotine dependence: Secondary | ICD-10-CM | POA: Diagnosis not present

## 2021-05-19 DIAGNOSIS — L89620 Pressure ulcer of left heel, unstageable: Secondary | ICD-10-CM | POA: Diagnosis not present

## 2021-05-19 DIAGNOSIS — Z993 Dependence on wheelchair: Secondary | ICD-10-CM | POA: Diagnosis not present

## 2021-05-19 DIAGNOSIS — E1122 Type 2 diabetes mellitus with diabetic chronic kidney disease: Secondary | ICD-10-CM | POA: Diagnosis not present

## 2021-05-19 DIAGNOSIS — K449 Diaphragmatic hernia without obstruction or gangrene: Secondary | ICD-10-CM | POA: Diagnosis not present

## 2021-05-23 ENCOUNTER — Ambulatory Visit: Payer: Medicare Other | Admitting: Nurse Practitioner

## 2021-05-23 DIAGNOSIS — I10 Essential (primary) hypertension: Secondary | ICD-10-CM | POA: Diagnosis not present

## 2021-05-23 DIAGNOSIS — D126 Benign neoplasm of colon, unspecified: Secondary | ICD-10-CM | POA: Diagnosis not present

## 2021-05-23 DIAGNOSIS — Z993 Dependence on wheelchair: Secondary | ICD-10-CM | POA: Diagnosis not present

## 2021-05-23 DIAGNOSIS — K2270 Barrett's esophagus without dysplasia: Secondary | ICD-10-CM | POA: Diagnosis not present

## 2021-05-23 DIAGNOSIS — N189 Chronic kidney disease, unspecified: Secondary | ICD-10-CM | POA: Diagnosis not present

## 2021-05-23 DIAGNOSIS — H409 Unspecified glaucoma: Secondary | ICD-10-CM | POA: Diagnosis not present

## 2021-05-23 DIAGNOSIS — Z87891 Personal history of nicotine dependence: Secondary | ICD-10-CM | POA: Diagnosis not present

## 2021-05-23 DIAGNOSIS — Z9181 History of falling: Secondary | ICD-10-CM | POA: Diagnosis not present

## 2021-05-23 DIAGNOSIS — E049 Nontoxic goiter, unspecified: Secondary | ICD-10-CM | POA: Diagnosis not present

## 2021-05-23 DIAGNOSIS — K5790 Diverticulosis of intestine, part unspecified, without perforation or abscess without bleeding: Secondary | ICD-10-CM | POA: Diagnosis not present

## 2021-05-23 DIAGNOSIS — L89612 Pressure ulcer of right heel, stage 2: Secondary | ICD-10-CM | POA: Diagnosis not present

## 2021-05-23 DIAGNOSIS — Z4800 Encounter for change or removal of nonsurgical wound dressing: Secondary | ICD-10-CM | POA: Diagnosis not present

## 2021-05-23 DIAGNOSIS — N6019 Diffuse cystic mastopathy of unspecified breast: Secondary | ICD-10-CM | POA: Diagnosis not present

## 2021-05-23 DIAGNOSIS — K449 Diaphragmatic hernia without obstruction or gangrene: Secondary | ICD-10-CM | POA: Diagnosis not present

## 2021-05-23 DIAGNOSIS — Z7984 Long term (current) use of oral hypoglycemic drugs: Secondary | ICD-10-CM | POA: Diagnosis not present

## 2021-05-23 DIAGNOSIS — E7800 Pure hypercholesterolemia, unspecified: Secondary | ICD-10-CM | POA: Diagnosis not present

## 2021-05-23 DIAGNOSIS — L89620 Pressure ulcer of left heel, unstageable: Secondary | ICD-10-CM | POA: Diagnosis not present

## 2021-05-23 DIAGNOSIS — E1122 Type 2 diabetes mellitus with diabetic chronic kidney disease: Secondary | ICD-10-CM | POA: Diagnosis not present

## 2021-05-24 DIAGNOSIS — K5790 Diverticulosis of intestine, part unspecified, without perforation or abscess without bleeding: Secondary | ICD-10-CM | POA: Diagnosis not present

## 2021-05-24 DIAGNOSIS — E7800 Pure hypercholesterolemia, unspecified: Secondary | ICD-10-CM | POA: Diagnosis not present

## 2021-05-24 DIAGNOSIS — Z7984 Long term (current) use of oral hypoglycemic drugs: Secondary | ICD-10-CM | POA: Diagnosis not present

## 2021-05-24 DIAGNOSIS — Z87891 Personal history of nicotine dependence: Secondary | ICD-10-CM | POA: Diagnosis not present

## 2021-05-24 DIAGNOSIS — I10 Essential (primary) hypertension: Secondary | ICD-10-CM | POA: Diagnosis not present

## 2021-05-24 DIAGNOSIS — L89620 Pressure ulcer of left heel, unstageable: Secondary | ICD-10-CM | POA: Diagnosis not present

## 2021-05-24 DIAGNOSIS — N6019 Diffuse cystic mastopathy of unspecified breast: Secondary | ICD-10-CM | POA: Diagnosis not present

## 2021-05-24 DIAGNOSIS — L89612 Pressure ulcer of right heel, stage 2: Secondary | ICD-10-CM | POA: Diagnosis not present

## 2021-05-24 DIAGNOSIS — Z4800 Encounter for change or removal of nonsurgical wound dressing: Secondary | ICD-10-CM | POA: Diagnosis not present

## 2021-05-24 DIAGNOSIS — D126 Benign neoplasm of colon, unspecified: Secondary | ICD-10-CM | POA: Diagnosis not present

## 2021-05-24 DIAGNOSIS — K2270 Barrett's esophagus without dysplasia: Secondary | ICD-10-CM | POA: Diagnosis not present

## 2021-05-24 DIAGNOSIS — E049 Nontoxic goiter, unspecified: Secondary | ICD-10-CM | POA: Diagnosis not present

## 2021-05-24 DIAGNOSIS — Z993 Dependence on wheelchair: Secondary | ICD-10-CM | POA: Diagnosis not present

## 2021-05-24 DIAGNOSIS — E1122 Type 2 diabetes mellitus with diabetic chronic kidney disease: Secondary | ICD-10-CM | POA: Diagnosis not present

## 2021-05-24 DIAGNOSIS — N189 Chronic kidney disease, unspecified: Secondary | ICD-10-CM | POA: Diagnosis not present

## 2021-05-24 DIAGNOSIS — Z9181 History of falling: Secondary | ICD-10-CM | POA: Diagnosis not present

## 2021-05-24 DIAGNOSIS — H409 Unspecified glaucoma: Secondary | ICD-10-CM | POA: Diagnosis not present

## 2021-05-24 DIAGNOSIS — K449 Diaphragmatic hernia without obstruction or gangrene: Secondary | ICD-10-CM | POA: Diagnosis not present

## 2021-05-28 ENCOUNTER — Other Ambulatory Visit: Payer: Self-pay

## 2021-05-28 ENCOUNTER — Encounter: Payer: Medicare Other | Admitting: Physician Assistant

## 2021-05-28 DIAGNOSIS — D126 Benign neoplasm of colon, unspecified: Secondary | ICD-10-CM | POA: Diagnosis not present

## 2021-05-28 DIAGNOSIS — E1122 Type 2 diabetes mellitus with diabetic chronic kidney disease: Secondary | ICD-10-CM | POA: Diagnosis not present

## 2021-05-28 DIAGNOSIS — N182 Chronic kidney disease, stage 2 (mild): Secondary | ICD-10-CM | POA: Diagnosis not present

## 2021-05-28 DIAGNOSIS — K449 Diaphragmatic hernia without obstruction or gangrene: Secondary | ICD-10-CM | POA: Diagnosis not present

## 2021-05-28 DIAGNOSIS — E7800 Pure hypercholesterolemia, unspecified: Secondary | ICD-10-CM | POA: Diagnosis not present

## 2021-05-28 DIAGNOSIS — I10 Essential (primary) hypertension: Secondary | ICD-10-CM | POA: Diagnosis not present

## 2021-05-28 DIAGNOSIS — L89620 Pressure ulcer of left heel, unstageable: Secondary | ICD-10-CM | POA: Diagnosis not present

## 2021-05-28 DIAGNOSIS — L89322 Pressure ulcer of left buttock, stage 2: Secondary | ICD-10-CM | POA: Diagnosis not present

## 2021-05-28 DIAGNOSIS — K2270 Barrett's esophagus without dysplasia: Secondary | ICD-10-CM | POA: Diagnosis not present

## 2021-05-28 DIAGNOSIS — L89312 Pressure ulcer of right buttock, stage 2: Secondary | ICD-10-CM | POA: Diagnosis not present

## 2021-05-28 DIAGNOSIS — L89520 Pressure ulcer of left ankle, unstageable: Secondary | ICD-10-CM | POA: Diagnosis not present

## 2021-05-28 DIAGNOSIS — E119 Type 2 diabetes mellitus without complications: Secondary | ICD-10-CM | POA: Diagnosis not present

## 2021-05-28 DIAGNOSIS — L89150 Pressure ulcer of sacral region, unstageable: Secondary | ICD-10-CM | POA: Diagnosis not present

## 2021-05-28 DIAGNOSIS — L89623 Pressure ulcer of left heel, stage 3: Secondary | ICD-10-CM | POA: Diagnosis not present

## 2021-05-28 DIAGNOSIS — L89612 Pressure ulcer of right heel, stage 2: Secondary | ICD-10-CM | POA: Diagnosis not present

## 2021-05-28 DIAGNOSIS — L89613 Pressure ulcer of right heel, stage 3: Secondary | ICD-10-CM | POA: Diagnosis not present

## 2021-05-28 DIAGNOSIS — L89893 Pressure ulcer of other site, stage 3: Secondary | ICD-10-CM | POA: Diagnosis not present

## 2021-05-28 DIAGNOSIS — K5790 Diverticulosis of intestine, part unspecified, without perforation or abscess without bleeding: Secondary | ICD-10-CM | POA: Diagnosis not present

## 2021-05-28 NOTE — Progress Notes (Addendum)
Decou, Miya E. ([id number]) ?Visit Report for 05/28/2021 ?Chief Complaint Document Details ?Patient Name: [name]. ?Date of Service: 05/28/2021 3:15 PM ?Medical Record Number: [id number] ?Patient Account Number: [id number] ?Date of Birth/Sex: 1931-12-01 (88 y.o. F) ?Treating RN: Donnamarie Poag ?Primary Care Provider: Kelton Pillar Other Clinician: ?Referring Provider: Kelton Pillar ?Treating Provider/Extender: Jeri Cos ?Weeks in Treatment: 22 ?Information Obtained from: Patient ?Chief Complaint ?Right heel pressure, left heel pressure ulcer, left ankle pressure ulcer, and bilateral gluteal pressure ulcers ?Electronic Signature(s) ?Signed: 05/28/2021 3:42:47 PM By: Worthy Keeler PA-C ?Entered By: Worthy Keeler on 05/28/2021 15:42:46 ?Wasser, Kyrstyn E. ([id number]) ?-------------------------------------------------------------------------------- ?HPI Details ?Patient Name: [name]. ?Date of Service: 05/28/2021 3:15 PM ?Medical Record Number: [id number] ?Patient Account Number: [id number] ?Date of Birth/Sex: 29-May-1931 (88 y.o. F) ?Treating RN: Donnamarie Poag ?Primary Care Provider: Kelton Pillar Other Clinician: ?Referring Provider: Kelton Pillar ?Treating Provider/Extender: Jeri Cos ?Weeks in Treatment: 22 ?History of Present Illness ?HPI Description: 12/25/20 upon evaluation today patient presents for a wound on her heel which has been present for about 1 and half months ?or so according to her daughter. The patient does have dementia. With that being said her daughter is the primary caregiver and seems to be ?doing an excellent job. She had a fairly significant wound on the heel with eschar covering however this was lifting up and it appears to be mostly ?healed underneath the biggest issue is on the posterior aspect of the heel where she has a small slit of an opening in the Achilles region which is ?actually what is mainly painful for her today. We do not have a hemoglobin A1c as the patient actually is seen  by a provider outside of the [facility] ?system so I am not able to look this up. With that being said I do not see any evidence of active infection and I do think that she is actually doing ?quite well I think her daughter is doing a great job taking care of the wound in particular. ?With regard to her past medical history she does have dementia and diabetes mellitus type 2 but otherwise no major medical problems. She did ?have an x-ray performed 11/27/2020 which was negative for any signs of osteomyelitis. ?01/03/2021 upon evaluation today patient appears to be doing well with regard to her heel ulcer. She has been tolerating the dressing changes ?without complication the collagen seems to be doing a good job for her. I do not see any signs of infection at this point. Unfortunately the patient ?is stated to have a wound on her bottom although her daughter is not sure how bad this really is next week we will get a get her into a room with ?a bed so that we can actually check this out I do not want things to get worse unnecessarily. ?01/10/2021 upon evaluation today patient's wound actually appears to be doing well in regard to her heel there is some slough noted I Georgina Peer clear ?this away with some sharp debridement today. With regard to the gluteal region there is some abrasion noted here. This seems to be very ?superficial and I think it is from her scratching mainly they have been using Desitin I think she could benefit from possibly utilizing some AandE ?ointment which I think could be of benefit for her. Fortunately there does not appear to be any signs of active infection systemically which is great ?news. ?01/17/2021 upon evaluation today patient's wound on the gluteal region actually appears to be healed and  everything is doing quite well. I am ?very pleased in that regard. With regard to the wound on her heel that is measuring smaller and looking much better and very pleased with what I ?see as well  today. ?01/25/2021 upon evaluation today patient appears to be doing well in regard to her wounds. She has been actually healing quite nicely in regard ?to the heel and the gluteal region also seems to be doing quite well. I do not see any evidence of infection which is great. She had several areas ?of scratching that she had been messing with but again nothing that appears to be open at the this point. ?02/15/2021 upon evaluation patient's wounds actually appear to be doing decently well. At this point I am still concerned about the pressure ulcer ?on her right heel. This does have some eschar overlying I Georgina Peer try to clear this away I am unsure how much is really open underneath though ?there appears to be some deep tissue injury around as well. Her daughter who generally takes care of her actually unfortunately had a stroke she ?is with her today but nonetheless is not able to do as much and does not remember as much about what is been going on treatment wise as she ?otherwise would. ?02/22/2021 upon evaluation today patient's wound on the heel actually was doing excellent. Unfortunately she does have a deep tissue injury on ?the left heel which is not good and not a good sign. This is something her daughter needs to keep a close eye on we do not want this to get worse ?at all. In fact I am hoping it will not even reopen up completely but again time will tell we never know until it is all said and done. ?04/18/2021 upon evaluation today patient unfortunately has not been seen in about 2 months. Nonetheless we have been trying to get her in they ?have been scheduling but have had issues with transportation that was in the no way related to being their fault. Nonetheless this has still ?continued to be an ongoing issue. 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 The good news is they have figured out a new way to get here through ?using the [facility] health transportation which is good to be definitely helpful for them. With  that being said the patient has 2 new wounds on the left ?heel and left ankle which were not there when I saw her last. The ankle region appears to potentially be infected we will get obtain a wound ?culture today. ?05/03/2021 upon evaluation today patient appears to be doing really not much better in regard to her wounds. With that being said there are ?dressings that she had and placed today were actually the same dressings that the patient was placed in on February 9 when we last saw her. ?With that being said we had sent orders to home health to have this changed on a regular basis 3 times a week but apparently according to home ?health the daughter told them that the dressings we put her on were supposed to stay in place until she came back. Again that obviously was ?really not what we were looking for at all. Nonetheless I think we need to get this straightened out since apparently the nursing side of this has ?been discontinued at this point. ?3/9; patient presents for follow-up. She has no issues or complaints today. Daughter is present and helps provide the history. Home health has ?not been coming out to do dressing changes. ?  05/28/2021 upon evaluation today patient appears to be doing pretty well in regard to her wounds. Fortunately I am not seeing any signs of active ?infection locally or systemically which is great news and overall I think that we are headed in the right direction. The good news is I do believe ?that the patient is being cared for well by her daughter although her daughter unfortunately is having a lot of issues herself making this much ?more difficult even than what its been in the past. ?Electronic Signature(s) ?Olgin, Dannisha E. ([id number]) ?Signed: 05/29/2021 8:02:18 AM By: Worthy Keeler PA-C ?Previous Signature: 05/29/2021 8:00:27 AM Version By: Worthy Keeler PA-C ?Entered By: Worthy Keeler on 05/29/2021 08:02:18 ?Penn, Larosa E.  ([id number]) ?-------------------------------------------------------------------------------- ?Physical Exam Details ?Patient Name: Goost[place], [name]. ?Date of Service: 05/28/2021 3:15 PM ?Medical Record Number: [id number] ?Patient Account Number: [id number] ?Date of Birth/Sex: 1/

## 2021-05-29 DIAGNOSIS — K449 Diaphragmatic hernia without obstruction or gangrene: Secondary | ICD-10-CM | POA: Diagnosis not present

## 2021-05-29 DIAGNOSIS — L89150 Pressure ulcer of sacral region, unstageable: Secondary | ICD-10-CM | POA: Diagnosis not present

## 2021-05-29 DIAGNOSIS — H409 Unspecified glaucoma: Secondary | ICD-10-CM | POA: Diagnosis not present

## 2021-05-29 DIAGNOSIS — L89612 Pressure ulcer of right heel, stage 2: Secondary | ICD-10-CM | POA: Diagnosis not present

## 2021-05-29 DIAGNOSIS — E7800 Pure hypercholesterolemia, unspecified: Secondary | ICD-10-CM | POA: Diagnosis not present

## 2021-05-29 DIAGNOSIS — Z993 Dependence on wheelchair: Secondary | ICD-10-CM | POA: Diagnosis not present

## 2021-05-29 DIAGNOSIS — K2270 Barrett's esophagus without dysplasia: Secondary | ICD-10-CM | POA: Diagnosis not present

## 2021-05-29 DIAGNOSIS — K5790 Diverticulosis of intestine, part unspecified, without perforation or abscess without bleeding: Secondary | ICD-10-CM | POA: Diagnosis not present

## 2021-05-29 DIAGNOSIS — N6019 Diffuse cystic mastopathy of unspecified breast: Secondary | ICD-10-CM | POA: Diagnosis not present

## 2021-05-29 DIAGNOSIS — Z9181 History of falling: Secondary | ICD-10-CM | POA: Diagnosis not present

## 2021-05-29 DIAGNOSIS — I10 Essential (primary) hypertension: Secondary | ICD-10-CM | POA: Diagnosis not present

## 2021-05-29 DIAGNOSIS — Z87891 Personal history of nicotine dependence: Secondary | ICD-10-CM | POA: Diagnosis not present

## 2021-05-29 DIAGNOSIS — Z4800 Encounter for change or removal of nonsurgical wound dressing: Secondary | ICD-10-CM | POA: Diagnosis not present

## 2021-05-29 DIAGNOSIS — L89620 Pressure ulcer of left heel, unstageable: Secondary | ICD-10-CM | POA: Diagnosis not present

## 2021-05-29 DIAGNOSIS — Z7984 Long term (current) use of oral hypoglycemic drugs: Secondary | ICD-10-CM | POA: Diagnosis not present

## 2021-05-29 DIAGNOSIS — E049 Nontoxic goiter, unspecified: Secondary | ICD-10-CM | POA: Diagnosis not present

## 2021-05-29 DIAGNOSIS — D126 Benign neoplasm of colon, unspecified: Secondary | ICD-10-CM | POA: Diagnosis not present

## 2021-05-29 DIAGNOSIS — E1122 Type 2 diabetes mellitus with diabetic chronic kidney disease: Secondary | ICD-10-CM | POA: Diagnosis not present

## 2021-05-29 DIAGNOSIS — N182 Chronic kidney disease, stage 2 (mild): Secondary | ICD-10-CM | POA: Diagnosis not present

## 2021-05-29 NOTE — Progress Notes (Addendum)
Diez, Roselina E. ([id number]) ?Visit Report for 05/28/2021 ?Arrival Information Details ?Patient Name: [name], [name]. ?Date of Service: 05/28/2021 3:15 PM ?Medical Record Number: [id number] ?Patient Account Number: [id number] ?Date of Birth/Sex: [birth year] (88 y.o. F) ?Treating RN: Donnamarie Poag ?Primary Care Kyrin Garn: Kelton Pillar Other Clinician: ?Referring Cornella Emmer: Kelton Pillar ?Treating Maize Brittingham/Extender: Jeri Cos ?Weeks in Treatment: 22 ?Visit Information History Since Last Visit ?Added or deleted any medications: No ?Patient Arrived: Wheel Chair ?Had a fall or experienced change in No ?Arrival Time: 15:27 ?activities of daily living that may affect ?Accompanied By: daughter ?risk of falls: ?Transfer Assistance: Civil Service fast streamer ?Hospitalized since last visit: No ?Patient Identification Verified: Yes ?Has Dressing in Place as Prescribed: Yes ?Secondary Verification Process Completed: Yes ?Pain Present Now: No ?Patient Requires Transmission-Based No ?Precautions: ?Patient Has Alerts: Yes ?Patient Alerts: Type II Diabetic ?HOYER/put in chair ? ADORATION ?HH ?Electronic Signature(s) ?Signed: 05/28/2021 3:54:10 PM By: Donnamarie Poag ?Entered ByDonnamarie Poag on 05/28/2021 15:27:59 ?[name], [name] ([id number]) ?-------------------------------------------------------------------------------- ?Clinic Level of Care Assessment Details ?Patient Name: [name], [name]. ?Date of Service: 05/28/2021 3:15 PM ?Medical Record Number: [id number] ?Patient Account Number: [id number] ?Date of Birth/Sex: 10/05/1931 (88 y.o. F) ?Treating RN: Donnamarie Poag ?Primary Care Seri Kimmer: Kelton Pillar Other Clinician: ?Referring Yoanna Jurczyk: Kelton Pillar ?Treating Fusae Florio/Extender: Jeri Cos ?Weeks in Treatment: 22 ?Clinic Level of Care Assessment Items ?TOOL 4 Quantity Score ?'[]'$  - Use when only an EandM is performed on FOLLOW-UP visit 0 ?ASSESSMENTS - Nursing Assessment / Reassessment ?'[]'$  - Reassessment of Co-morbidities (includes updates in patient  status) 0 ?X- 1 5 ?Reassessment of Adherence to Treatment Plan ?ASSESSMENTS - Wound and Skin Assessment / Reassessment ?'[]'$  - Simple Wound Assessment / Reassessment - one wound 0 ?X- 4 5 ?Complex Wound Assessment / Reassessment - multiple wounds ?'[]'$  - 0 ?Dermatologic / Skin Assessment (not related to wound area) ?ASSESSMENTS - Focused Assessment ?'[]'$  - Circumferential Edema Measurements - multi extremities 0 ?'[]'$  - 0 ?Nutritional Assessment / Counseling / Intervention ?'[]'$  - 0 ?Lower Extremity Assessment (monofilament, tuning fork, pulses) ?'[]'$  - 0 ?Peripheral Arterial Disease Assessment (using hand held doppler) ?ASSESSMENTS - Ostomy and/or Continence Assessment and Care ?'[]'$  - Incontinence Assessment and Management 0 ?'[]'$  - 0 ?Ostomy Care Assessment and Management (repouching, etc.) ?PROCESS - Coordination of Care ?'[]'$  - Simple Patient / Family Education for ongoing care 0 ?X- 1 20 ?Complex (extensive) Patient / Family Education for ongoing care ?'[]'$  - 0 ?Staff obtains Consents, Records, Test Results / Process Orders ?X- 1 10 ?Staff telephones HHA, Nursing Homes / Clarify orders / etc ?'[]'$  - 0 ?Routine Transfer to another Facility (non-emergent condition) ?'[]'$  - 0 ?Routine Hospital Admission (non-emergent condition) ?'[]'$  - 0 ?New Admissions / Biomedical engineer / Ordering NPWT, Apligraf, etc. ?'[]'$  - 0 ?Emergency Hospital Admission (emergent condition) ?X- 1 10 ?Simple Discharge Coordination ?'[]'$  - 0 ?Complex (extensive) Discharge Coordination ?PROCESS - Special Needs ?'[]'$  - Pediatric / Minor Patient Management 0 ?'[]'$  - 0 ?Isolation Patient Management ?'[]'$  - 0 ?Hearing / Language / Visual special needs ?X- 1 15 ?Assessment of Community assistance (transportation, D/C planning, etc.) ?'[]'$  - 0 ?Additional assistance / Altered mentation ?'[]'$  - 0 ?Support Surface(s) Assessment (bed, cushion, seat, etc.) ?INTERVENTIONS - Wound Cleansing / Measurement ?[name], [name] ([id number]) ?'[]'$  - 0 ?Simple Wound Cleansing - one wound ?X- 4  5 ?Complex Wound Cleansing - multiple wounds ?X- 1 5 ?Wound Imaging (photographs - any number of wounds) ?'[]'$  - 0 ?Wound Tracing (instead of photographs) ?'[]'$  - 0 ?Simple Wound  Measurement - one wound ?X- 4 5 ?Complex Wound Measurement - multiple wounds ?INTERVENTIONS - Wound Dressings ?X - Small Wound Dressing one or multiple wounds 4 10 ?'[]'$  - 0 ?Medium Wound Dressing one or multiple wounds ?'[]'$  - 0 ?Large Wound Dressing one or multiple wounds ?X- 1 5 ?Application of Medications - topical ?'[]'$  - 0 ?Application of Medications - injection ?INTERVENTIONS - Miscellaneous ?'[]'$  - External ear exam 0 ?'[]'$  - 0 ?Specimen Collection (cultures, biopsies, blood, body fluids, etc.) ?'[]'$  - 0 ?Specimen(s) / Culture(s) sent or taken to Lab for analysis ?X- 1 10 ?Patient Transfer (multiple staff / Civil Service fast streamer / Similar devices) ?'[]'$  - 0 ?Simple Staple / Suture removal (25 or less) ?'[]'$  - 0 ?Complex Staple / Suture removal (26 or more) ?'[]'$  - 0 ?Hypo / Hyperglycemic Management (close monitor of Blood Glucose) ?'[]'$  - 0 ?Ankle / Brachial Index (ABI) - do not check if billed separately ?X- 1 5 ?Vital Signs ?Has the patient been seen at the hospital within the last three years: Yes ?Total Score: 185 ?Level Of Care: New/Established - Level ?5 ?Electronic Signature(s) ?Signed: 05/28/2021 4:35:01 PM By: Donnamarie Poag ?Entered ByDonnamarie Poag on 05/28/2021 16:29:07 ?[name], [name] ([id number]) ?-------------------------------------------------------------------------------- ?Complex / Palliative Patient Assessment Details ?Patient Name: [name], [name]. ?Date of Service: 05/28/2021 3:15 PM ?Medical Record Number: [id number] ?Patient Account Number: [id number] ?Date of Birth/Sex: Apr 20, 1931 (88 y.o. F) ?Treating RN: Donnamarie Poag ?Primary Care Ellanie Oppedisano: Kelton Pillar Other Clinician: ?Referring Rella Egelston: Kelton Pillar ?Treating Bach Rocchi/Extender: Jeri Cos ?Weeks in Treatment: 22 ?Complex Wound Management Criteria ?Patient has remarkable or complex  co-morbidities requiring medications or treatments that extend wound healing times. Examples: ?o? Diabetes mellitus with chronic renal failure or end stage renal disease requiring dialysis ?o? Advanced or poorly controlled rheumatoid arthritis ?o? Diabetes mellitus and end stage chronic obstructive pulmonary disease ?o? Active cancer with current chemo- or radiation therapy ?DM; bedbound; dementia ?Palliative Wound Management Criteria ?Care Approach ?Wound Care Plan: Complex Wound Management ?Electronic Signature(s) ?Signed: 05/28/2021 3:54:10 PM By: Donnamarie Poag ?Signed: 05/29/2021 11:06:47 AM By: Worthy Keeler PA-C ?Entered ByDonnamarie Poag on 05/28/2021 15:49:55 ?[name], [name] ([id number]) ?-------------------------------------------------------------------------------- ?Encounter Discharge Information Details ?Patient Name: [name], [name]. ?Date of Service: 05/28/2021 3:15 PM ?Medical Record Number: [id number] ?Patient Account Number: [id number] ?Date of Birth/Sex: Sep 29, 1931 (88 y.o. F) ?Treating RN: Donnamarie Poag ?Primary Care Nain Rudd: Kelton Pillar Other Clinician: ?Referring Nova Evett: Kelton Pillar ?Treating Emeree Mahler/Extender: Jeri Cos ?Weeks in Treatment: 22 ?Encounter Discharge Information Items ?Discharge Condition: Stable ?Ambulatory Status: Wheelchair ?Discharge Destination: Home Health ?Telephoned: No ?Orders Sent: Yes ?Transportation: Other ?Accompanied By: daughter ?Schedule Follow-up Appointment: Yes ?Clinical Summary of Care: ?Electronic Signature(s) ?Signed: 05/28/2021 4:35:01 PM By: Donnamarie Poag ?Entered ByDonnamarie Poag on 05/28/2021 16:30:36 ?[name], [name] ([id number]) ?-------------------------------------------------------------------------------- ?Lower Extremity Assessment Details ?Patient Name: [name], [name]. ?Date of Service: 05/28/2021 3:15 PM ?Medical Record Number: [id number] ?Patient Account Number: [id number] ?Date of Birth/Sex: 1931/12/08 (88 y.o. F) ?Treating RN: Donnamarie Poag ?Primary Care  Lai Hendriks: Kelton Pillar Other Clinician: ?Referring Rubyann Lingle: Kelton Pillar ?Treating Brandalyn Harting/Extender: Jeri Cos ?Weeks in Treatment: 22 ?Edema Assessment ?Assessed: [Left: Yes] [Right: Yes] ?Edema: [Left: No] [Righ

## 2021-05-31 DIAGNOSIS — Z7984 Long term (current) use of oral hypoglycemic drugs: Secondary | ICD-10-CM | POA: Diagnosis not present

## 2021-05-31 DIAGNOSIS — K449 Diaphragmatic hernia without obstruction or gangrene: Secondary | ICD-10-CM | POA: Diagnosis not present

## 2021-05-31 DIAGNOSIS — L89612 Pressure ulcer of right heel, stage 2: Secondary | ICD-10-CM | POA: Diagnosis not present

## 2021-05-31 DIAGNOSIS — H409 Unspecified glaucoma: Secondary | ICD-10-CM | POA: Diagnosis not present

## 2021-05-31 DIAGNOSIS — E049 Nontoxic goiter, unspecified: Secondary | ICD-10-CM | POA: Diagnosis not present

## 2021-05-31 DIAGNOSIS — Z9181 History of falling: Secondary | ICD-10-CM | POA: Diagnosis not present

## 2021-05-31 DIAGNOSIS — D126 Benign neoplasm of colon, unspecified: Secondary | ICD-10-CM | POA: Diagnosis not present

## 2021-05-31 DIAGNOSIS — I10 Essential (primary) hypertension: Secondary | ICD-10-CM | POA: Diagnosis not present

## 2021-05-31 DIAGNOSIS — Z4800 Encounter for change or removal of nonsurgical wound dressing: Secondary | ICD-10-CM | POA: Diagnosis not present

## 2021-05-31 DIAGNOSIS — Z993 Dependence on wheelchair: Secondary | ICD-10-CM | POA: Diagnosis not present

## 2021-05-31 DIAGNOSIS — L89620 Pressure ulcer of left heel, unstageable: Secondary | ICD-10-CM | POA: Diagnosis not present

## 2021-05-31 DIAGNOSIS — E7800 Pure hypercholesterolemia, unspecified: Secondary | ICD-10-CM | POA: Diagnosis not present

## 2021-05-31 DIAGNOSIS — N6019 Diffuse cystic mastopathy of unspecified breast: Secondary | ICD-10-CM | POA: Diagnosis not present

## 2021-05-31 DIAGNOSIS — K5790 Diverticulosis of intestine, part unspecified, without perforation or abscess without bleeding: Secondary | ICD-10-CM | POA: Diagnosis not present

## 2021-05-31 DIAGNOSIS — N182 Chronic kidney disease, stage 2 (mild): Secondary | ICD-10-CM | POA: Diagnosis not present

## 2021-05-31 DIAGNOSIS — K2270 Barrett's esophagus without dysplasia: Secondary | ICD-10-CM | POA: Diagnosis not present

## 2021-05-31 DIAGNOSIS — Z87891 Personal history of nicotine dependence: Secondary | ICD-10-CM | POA: Diagnosis not present

## 2021-05-31 DIAGNOSIS — L89150 Pressure ulcer of sacral region, unstageable: Secondary | ICD-10-CM | POA: Diagnosis not present

## 2021-05-31 DIAGNOSIS — E1122 Type 2 diabetes mellitus with diabetic chronic kidney disease: Secondary | ICD-10-CM | POA: Diagnosis not present

## 2021-06-03 DIAGNOSIS — E1122 Type 2 diabetes mellitus with diabetic chronic kidney disease: Secondary | ICD-10-CM | POA: Diagnosis not present

## 2021-06-03 DIAGNOSIS — D126 Benign neoplasm of colon, unspecified: Secondary | ICD-10-CM | POA: Diagnosis not present

## 2021-06-03 DIAGNOSIS — E7800 Pure hypercholesterolemia, unspecified: Secondary | ICD-10-CM | POA: Diagnosis not present

## 2021-06-03 DIAGNOSIS — K5790 Diverticulosis of intestine, part unspecified, without perforation or abscess without bleeding: Secondary | ICD-10-CM | POA: Diagnosis not present

## 2021-06-03 DIAGNOSIS — H409 Unspecified glaucoma: Secondary | ICD-10-CM | POA: Diagnosis not present

## 2021-06-03 DIAGNOSIS — N6019 Diffuse cystic mastopathy of unspecified breast: Secondary | ICD-10-CM | POA: Diagnosis not present

## 2021-06-03 DIAGNOSIS — K2270 Barrett's esophagus without dysplasia: Secondary | ICD-10-CM | POA: Diagnosis not present

## 2021-06-03 DIAGNOSIS — N182 Chronic kidney disease, stage 2 (mild): Secondary | ICD-10-CM | POA: Diagnosis not present

## 2021-06-03 DIAGNOSIS — L89150 Pressure ulcer of sacral region, unstageable: Secondary | ICD-10-CM | POA: Diagnosis not present

## 2021-06-03 DIAGNOSIS — Z87891 Personal history of nicotine dependence: Secondary | ICD-10-CM | POA: Diagnosis not present

## 2021-06-03 DIAGNOSIS — E049 Nontoxic goiter, unspecified: Secondary | ICD-10-CM | POA: Diagnosis not present

## 2021-06-03 DIAGNOSIS — I10 Essential (primary) hypertension: Secondary | ICD-10-CM | POA: Diagnosis not present

## 2021-06-03 DIAGNOSIS — L89612 Pressure ulcer of right heel, stage 2: Secondary | ICD-10-CM | POA: Diagnosis not present

## 2021-06-03 DIAGNOSIS — Z4800 Encounter for change or removal of nonsurgical wound dressing: Secondary | ICD-10-CM | POA: Diagnosis not present

## 2021-06-03 DIAGNOSIS — K449 Diaphragmatic hernia without obstruction or gangrene: Secondary | ICD-10-CM | POA: Diagnosis not present

## 2021-06-03 DIAGNOSIS — Z7984 Long term (current) use of oral hypoglycemic drugs: Secondary | ICD-10-CM | POA: Diagnosis not present

## 2021-06-03 DIAGNOSIS — Z9181 History of falling: Secondary | ICD-10-CM | POA: Diagnosis not present

## 2021-06-03 DIAGNOSIS — Z993 Dependence on wheelchair: Secondary | ICD-10-CM | POA: Diagnosis not present

## 2021-06-03 DIAGNOSIS — L89620 Pressure ulcer of left heel, unstageable: Secondary | ICD-10-CM | POA: Diagnosis not present

## 2021-06-04 DIAGNOSIS — E049 Nontoxic goiter, unspecified: Secondary | ICD-10-CM | POA: Diagnosis not present

## 2021-06-04 DIAGNOSIS — E7800 Pure hypercholesterolemia, unspecified: Secondary | ICD-10-CM | POA: Diagnosis not present

## 2021-06-04 DIAGNOSIS — I10 Essential (primary) hypertension: Secondary | ICD-10-CM | POA: Diagnosis not present

## 2021-06-04 DIAGNOSIS — N182 Chronic kidney disease, stage 2 (mild): Secondary | ICD-10-CM | POA: Diagnosis not present

## 2021-06-04 DIAGNOSIS — L89620 Pressure ulcer of left heel, unstageable: Secondary | ICD-10-CM | POA: Diagnosis not present

## 2021-06-04 DIAGNOSIS — L89150 Pressure ulcer of sacral region, unstageable: Secondary | ICD-10-CM | POA: Diagnosis not present

## 2021-06-04 DIAGNOSIS — Z4800 Encounter for change or removal of nonsurgical wound dressing: Secondary | ICD-10-CM | POA: Diagnosis not present

## 2021-06-04 DIAGNOSIS — H409 Unspecified glaucoma: Secondary | ICD-10-CM | POA: Diagnosis not present

## 2021-06-04 DIAGNOSIS — K2270 Barrett's esophagus without dysplasia: Secondary | ICD-10-CM | POA: Diagnosis not present

## 2021-06-04 DIAGNOSIS — N6019 Diffuse cystic mastopathy of unspecified breast: Secondary | ICD-10-CM | POA: Diagnosis not present

## 2021-06-04 DIAGNOSIS — Z9181 History of falling: Secondary | ICD-10-CM | POA: Diagnosis not present

## 2021-06-04 DIAGNOSIS — D126 Benign neoplasm of colon, unspecified: Secondary | ICD-10-CM | POA: Diagnosis not present

## 2021-06-04 DIAGNOSIS — Z87891 Personal history of nicotine dependence: Secondary | ICD-10-CM | POA: Diagnosis not present

## 2021-06-04 DIAGNOSIS — K449 Diaphragmatic hernia without obstruction or gangrene: Secondary | ICD-10-CM | POA: Diagnosis not present

## 2021-06-04 DIAGNOSIS — Z993 Dependence on wheelchair: Secondary | ICD-10-CM | POA: Diagnosis not present

## 2021-06-04 DIAGNOSIS — Z7984 Long term (current) use of oral hypoglycemic drugs: Secondary | ICD-10-CM | POA: Diagnosis not present

## 2021-06-04 DIAGNOSIS — L89612 Pressure ulcer of right heel, stage 2: Secondary | ICD-10-CM | POA: Diagnosis not present

## 2021-06-04 DIAGNOSIS — K5790 Diverticulosis of intestine, part unspecified, without perforation or abscess without bleeding: Secondary | ICD-10-CM | POA: Diagnosis not present

## 2021-06-04 DIAGNOSIS — E1122 Type 2 diabetes mellitus with diabetic chronic kidney disease: Secondary | ICD-10-CM | POA: Diagnosis not present

## 2021-06-06 DIAGNOSIS — E7800 Pure hypercholesterolemia, unspecified: Secondary | ICD-10-CM | POA: Diagnosis not present

## 2021-06-06 DIAGNOSIS — L89620 Pressure ulcer of left heel, unstageable: Secondary | ICD-10-CM | POA: Diagnosis not present

## 2021-06-06 DIAGNOSIS — I10 Essential (primary) hypertension: Secondary | ICD-10-CM | POA: Diagnosis not present

## 2021-06-06 DIAGNOSIS — K2270 Barrett's esophagus without dysplasia: Secondary | ICD-10-CM | POA: Diagnosis not present

## 2021-06-06 DIAGNOSIS — Z87891 Personal history of nicotine dependence: Secondary | ICD-10-CM | POA: Diagnosis not present

## 2021-06-06 DIAGNOSIS — N182 Chronic kidney disease, stage 2 (mild): Secondary | ICD-10-CM | POA: Diagnosis not present

## 2021-06-06 DIAGNOSIS — E049 Nontoxic goiter, unspecified: Secondary | ICD-10-CM | POA: Diagnosis not present

## 2021-06-06 DIAGNOSIS — L89150 Pressure ulcer of sacral region, unstageable: Secondary | ICD-10-CM | POA: Diagnosis not present

## 2021-06-06 DIAGNOSIS — Z7984 Long term (current) use of oral hypoglycemic drugs: Secondary | ICD-10-CM | POA: Diagnosis not present

## 2021-06-06 DIAGNOSIS — K5790 Diverticulosis of intestine, part unspecified, without perforation or abscess without bleeding: Secondary | ICD-10-CM | POA: Diagnosis not present

## 2021-06-06 DIAGNOSIS — Z4800 Encounter for change or removal of nonsurgical wound dressing: Secondary | ICD-10-CM | POA: Diagnosis not present

## 2021-06-06 DIAGNOSIS — Z993 Dependence on wheelchair: Secondary | ICD-10-CM | POA: Diagnosis not present

## 2021-06-06 DIAGNOSIS — E1122 Type 2 diabetes mellitus with diabetic chronic kidney disease: Secondary | ICD-10-CM | POA: Diagnosis not present

## 2021-06-06 DIAGNOSIS — D126 Benign neoplasm of colon, unspecified: Secondary | ICD-10-CM | POA: Diagnosis not present

## 2021-06-06 DIAGNOSIS — H409 Unspecified glaucoma: Secondary | ICD-10-CM | POA: Diagnosis not present

## 2021-06-06 DIAGNOSIS — N6019 Diffuse cystic mastopathy of unspecified breast: Secondary | ICD-10-CM | POA: Diagnosis not present

## 2021-06-06 DIAGNOSIS — K449 Diaphragmatic hernia without obstruction or gangrene: Secondary | ICD-10-CM | POA: Diagnosis not present

## 2021-06-06 DIAGNOSIS — L89612 Pressure ulcer of right heel, stage 2: Secondary | ICD-10-CM | POA: Diagnosis not present

## 2021-06-06 DIAGNOSIS — Z9181 History of falling: Secondary | ICD-10-CM | POA: Diagnosis not present

## 2021-06-07 DIAGNOSIS — H409 Unspecified glaucoma: Secondary | ICD-10-CM | POA: Diagnosis not present

## 2021-06-07 DIAGNOSIS — I10 Essential (primary) hypertension: Secondary | ICD-10-CM | POA: Diagnosis not present

## 2021-06-07 DIAGNOSIS — Z993 Dependence on wheelchair: Secondary | ICD-10-CM | POA: Diagnosis not present

## 2021-06-07 DIAGNOSIS — L89612 Pressure ulcer of right heel, stage 2: Secondary | ICD-10-CM | POA: Diagnosis not present

## 2021-06-07 DIAGNOSIS — Z9181 History of falling: Secondary | ICD-10-CM | POA: Diagnosis not present

## 2021-06-07 DIAGNOSIS — N182 Chronic kidney disease, stage 2 (mild): Secondary | ICD-10-CM | POA: Diagnosis not present

## 2021-06-07 DIAGNOSIS — L89620 Pressure ulcer of left heel, unstageable: Secondary | ICD-10-CM | POA: Diagnosis not present

## 2021-06-07 DIAGNOSIS — E7800 Pure hypercholesterolemia, unspecified: Secondary | ICD-10-CM | POA: Diagnosis not present

## 2021-06-07 DIAGNOSIS — Z7984 Long term (current) use of oral hypoglycemic drugs: Secondary | ICD-10-CM | POA: Diagnosis not present

## 2021-06-07 DIAGNOSIS — Z4800 Encounter for change or removal of nonsurgical wound dressing: Secondary | ICD-10-CM | POA: Diagnosis not present

## 2021-06-07 DIAGNOSIS — E1122 Type 2 diabetes mellitus with diabetic chronic kidney disease: Secondary | ICD-10-CM | POA: Diagnosis not present

## 2021-06-07 DIAGNOSIS — Z87891 Personal history of nicotine dependence: Secondary | ICD-10-CM | POA: Diagnosis not present

## 2021-06-07 DIAGNOSIS — K449 Diaphragmatic hernia without obstruction or gangrene: Secondary | ICD-10-CM | POA: Diagnosis not present

## 2021-06-07 DIAGNOSIS — E049 Nontoxic goiter, unspecified: Secondary | ICD-10-CM | POA: Diagnosis not present

## 2021-06-07 DIAGNOSIS — K5790 Diverticulosis of intestine, part unspecified, without perforation or abscess without bleeding: Secondary | ICD-10-CM | POA: Diagnosis not present

## 2021-06-07 DIAGNOSIS — L89150 Pressure ulcer of sacral region, unstageable: Secondary | ICD-10-CM | POA: Diagnosis not present

## 2021-06-07 DIAGNOSIS — N6019 Diffuse cystic mastopathy of unspecified breast: Secondary | ICD-10-CM | POA: Diagnosis not present

## 2021-06-07 DIAGNOSIS — D126 Benign neoplasm of colon, unspecified: Secondary | ICD-10-CM | POA: Diagnosis not present

## 2021-06-07 DIAGNOSIS — K2270 Barrett's esophagus without dysplasia: Secondary | ICD-10-CM | POA: Diagnosis not present

## 2021-06-08 DIAGNOSIS — R262 Difficulty in walking, not elsewhere classified: Secondary | ICD-10-CM | POA: Diagnosis not present

## 2021-06-08 DIAGNOSIS — L89312 Pressure ulcer of right buttock, stage 2: Secondary | ICD-10-CM | POA: Diagnosis not present

## 2021-06-08 DIAGNOSIS — L89303 Pressure ulcer of unspecified buttock, stage 3: Secondary | ICD-10-CM | POA: Diagnosis not present

## 2021-06-08 DIAGNOSIS — L89893 Pressure ulcer of other site, stage 3: Secondary | ICD-10-CM | POA: Diagnosis not present

## 2021-06-08 DIAGNOSIS — M1990 Unspecified osteoarthritis, unspecified site: Secondary | ICD-10-CM | POA: Diagnosis not present

## 2021-06-08 DIAGNOSIS — L89623 Pressure ulcer of left heel, stage 3: Secondary | ICD-10-CM | POA: Diagnosis not present

## 2021-06-10 DIAGNOSIS — M1990 Unspecified osteoarthritis, unspecified site: Secondary | ICD-10-CM | POA: Diagnosis not present

## 2021-06-12 DIAGNOSIS — Z87891 Personal history of nicotine dependence: Secondary | ICD-10-CM | POA: Diagnosis not present

## 2021-06-12 DIAGNOSIS — K5790 Diverticulosis of intestine, part unspecified, without perforation or abscess without bleeding: Secondary | ICD-10-CM | POA: Diagnosis not present

## 2021-06-12 DIAGNOSIS — K449 Diaphragmatic hernia without obstruction or gangrene: Secondary | ICD-10-CM | POA: Diagnosis not present

## 2021-06-12 DIAGNOSIS — K2270 Barrett's esophagus without dysplasia: Secondary | ICD-10-CM | POA: Diagnosis not present

## 2021-06-12 DIAGNOSIS — Z4800 Encounter for change or removal of nonsurgical wound dressing: Secondary | ICD-10-CM | POA: Diagnosis not present

## 2021-06-12 DIAGNOSIS — L89612 Pressure ulcer of right heel, stage 2: Secondary | ICD-10-CM | POA: Diagnosis not present

## 2021-06-12 DIAGNOSIS — E7800 Pure hypercholesterolemia, unspecified: Secondary | ICD-10-CM | POA: Diagnosis not present

## 2021-06-12 DIAGNOSIS — Z7984 Long term (current) use of oral hypoglycemic drugs: Secondary | ICD-10-CM | POA: Diagnosis not present

## 2021-06-12 DIAGNOSIS — Z9181 History of falling: Secondary | ICD-10-CM | POA: Diagnosis not present

## 2021-06-12 DIAGNOSIS — N6019 Diffuse cystic mastopathy of unspecified breast: Secondary | ICD-10-CM | POA: Diagnosis not present

## 2021-06-12 DIAGNOSIS — E1122 Type 2 diabetes mellitus with diabetic chronic kidney disease: Secondary | ICD-10-CM | POA: Diagnosis not present

## 2021-06-12 DIAGNOSIS — L89620 Pressure ulcer of left heel, unstageable: Secondary | ICD-10-CM | POA: Diagnosis not present

## 2021-06-12 DIAGNOSIS — D126 Benign neoplasm of colon, unspecified: Secondary | ICD-10-CM | POA: Diagnosis not present

## 2021-06-12 DIAGNOSIS — N182 Chronic kidney disease, stage 2 (mild): Secondary | ICD-10-CM | POA: Diagnosis not present

## 2021-06-12 DIAGNOSIS — I10 Essential (primary) hypertension: Secondary | ICD-10-CM | POA: Diagnosis not present

## 2021-06-12 DIAGNOSIS — E049 Nontoxic goiter, unspecified: Secondary | ICD-10-CM | POA: Diagnosis not present

## 2021-06-12 DIAGNOSIS — H409 Unspecified glaucoma: Secondary | ICD-10-CM | POA: Diagnosis not present

## 2021-06-12 DIAGNOSIS — L89150 Pressure ulcer of sacral region, unstageable: Secondary | ICD-10-CM | POA: Diagnosis not present

## 2021-06-12 DIAGNOSIS — Z993 Dependence on wheelchair: Secondary | ICD-10-CM | POA: Diagnosis not present

## 2021-06-13 ENCOUNTER — Ambulatory Visit: Payer: Medicare Other | Admitting: Physician Assistant

## 2021-06-13 NOTE — Telephone Encounter (Signed)
error 

## 2021-06-14 DIAGNOSIS — K2270 Barrett's esophagus without dysplasia: Secondary | ICD-10-CM | POA: Diagnosis not present

## 2021-06-14 DIAGNOSIS — Z9181 History of falling: Secondary | ICD-10-CM | POA: Diagnosis not present

## 2021-06-14 DIAGNOSIS — L89612 Pressure ulcer of right heel, stage 2: Secondary | ICD-10-CM | POA: Diagnosis not present

## 2021-06-14 DIAGNOSIS — H409 Unspecified glaucoma: Secondary | ICD-10-CM | POA: Diagnosis not present

## 2021-06-14 DIAGNOSIS — D126 Benign neoplasm of colon, unspecified: Secondary | ICD-10-CM | POA: Diagnosis not present

## 2021-06-14 DIAGNOSIS — I10 Essential (primary) hypertension: Secondary | ICD-10-CM | POA: Diagnosis not present

## 2021-06-14 DIAGNOSIS — Z7984 Long term (current) use of oral hypoglycemic drugs: Secondary | ICD-10-CM | POA: Diagnosis not present

## 2021-06-14 DIAGNOSIS — L89620 Pressure ulcer of left heel, unstageable: Secondary | ICD-10-CM | POA: Diagnosis not present

## 2021-06-14 DIAGNOSIS — E7800 Pure hypercholesterolemia, unspecified: Secondary | ICD-10-CM | POA: Diagnosis not present

## 2021-06-14 DIAGNOSIS — E1122 Type 2 diabetes mellitus with diabetic chronic kidney disease: Secondary | ICD-10-CM | POA: Diagnosis not present

## 2021-06-14 DIAGNOSIS — N182 Chronic kidney disease, stage 2 (mild): Secondary | ICD-10-CM | POA: Diagnosis not present

## 2021-06-14 DIAGNOSIS — K449 Diaphragmatic hernia without obstruction or gangrene: Secondary | ICD-10-CM | POA: Diagnosis not present

## 2021-06-14 DIAGNOSIS — Z993 Dependence on wheelchair: Secondary | ICD-10-CM | POA: Diagnosis not present

## 2021-06-14 DIAGNOSIS — L89150 Pressure ulcer of sacral region, unstageable: Secondary | ICD-10-CM | POA: Diagnosis not present

## 2021-06-14 DIAGNOSIS — K5790 Diverticulosis of intestine, part unspecified, without perforation or abscess without bleeding: Secondary | ICD-10-CM | POA: Diagnosis not present

## 2021-06-14 DIAGNOSIS — Z4800 Encounter for change or removal of nonsurgical wound dressing: Secondary | ICD-10-CM | POA: Diagnosis not present

## 2021-06-14 DIAGNOSIS — N6019 Diffuse cystic mastopathy of unspecified breast: Secondary | ICD-10-CM | POA: Diagnosis not present

## 2021-06-14 DIAGNOSIS — Z87891 Personal history of nicotine dependence: Secondary | ICD-10-CM | POA: Diagnosis not present

## 2021-06-14 DIAGNOSIS — E049 Nontoxic goiter, unspecified: Secondary | ICD-10-CM | POA: Diagnosis not present

## 2021-06-18 DIAGNOSIS — N182 Chronic kidney disease, stage 2 (mild): Secondary | ICD-10-CM | POA: Diagnosis not present

## 2021-06-18 DIAGNOSIS — K5790 Diverticulosis of intestine, part unspecified, without perforation or abscess without bleeding: Secondary | ICD-10-CM | POA: Diagnosis not present

## 2021-06-18 DIAGNOSIS — K2270 Barrett's esophagus without dysplasia: Secondary | ICD-10-CM | POA: Diagnosis not present

## 2021-06-18 DIAGNOSIS — K449 Diaphragmatic hernia without obstruction or gangrene: Secondary | ICD-10-CM | POA: Diagnosis not present

## 2021-06-18 DIAGNOSIS — Z993 Dependence on wheelchair: Secondary | ICD-10-CM | POA: Diagnosis not present

## 2021-06-18 DIAGNOSIS — E7800 Pure hypercholesterolemia, unspecified: Secondary | ICD-10-CM | POA: Diagnosis not present

## 2021-06-18 DIAGNOSIS — Z4800 Encounter for change or removal of nonsurgical wound dressing: Secondary | ICD-10-CM | POA: Diagnosis not present

## 2021-06-18 DIAGNOSIS — L89612 Pressure ulcer of right heel, stage 2: Secondary | ICD-10-CM | POA: Diagnosis not present

## 2021-06-18 DIAGNOSIS — E1122 Type 2 diabetes mellitus with diabetic chronic kidney disease: Secondary | ICD-10-CM | POA: Diagnosis not present

## 2021-06-18 DIAGNOSIS — H409 Unspecified glaucoma: Secondary | ICD-10-CM | POA: Diagnosis not present

## 2021-06-18 DIAGNOSIS — L89620 Pressure ulcer of left heel, unstageable: Secondary | ICD-10-CM | POA: Diagnosis not present

## 2021-06-18 DIAGNOSIS — D126 Benign neoplasm of colon, unspecified: Secondary | ICD-10-CM | POA: Diagnosis not present

## 2021-06-18 DIAGNOSIS — L89150 Pressure ulcer of sacral region, unstageable: Secondary | ICD-10-CM | POA: Diagnosis not present

## 2021-06-18 DIAGNOSIS — I10 Essential (primary) hypertension: Secondary | ICD-10-CM | POA: Diagnosis not present

## 2021-06-18 DIAGNOSIS — Z87891 Personal history of nicotine dependence: Secondary | ICD-10-CM | POA: Diagnosis not present

## 2021-06-18 DIAGNOSIS — E049 Nontoxic goiter, unspecified: Secondary | ICD-10-CM | POA: Diagnosis not present

## 2021-06-18 DIAGNOSIS — N6019 Diffuse cystic mastopathy of unspecified breast: Secondary | ICD-10-CM | POA: Diagnosis not present

## 2021-06-18 DIAGNOSIS — Z7984 Long term (current) use of oral hypoglycemic drugs: Secondary | ICD-10-CM | POA: Diagnosis not present

## 2021-06-18 DIAGNOSIS — Z9181 History of falling: Secondary | ICD-10-CM | POA: Diagnosis not present

## 2021-06-19 ENCOUNTER — Ambulatory Visit: Payer: Medicare Other | Admitting: Podiatry

## 2021-06-21 DIAGNOSIS — E049 Nontoxic goiter, unspecified: Secondary | ICD-10-CM | POA: Diagnosis not present

## 2021-06-21 DIAGNOSIS — I10 Essential (primary) hypertension: Secondary | ICD-10-CM | POA: Diagnosis not present

## 2021-06-21 DIAGNOSIS — E1122 Type 2 diabetes mellitus with diabetic chronic kidney disease: Secondary | ICD-10-CM | POA: Diagnosis not present

## 2021-06-21 DIAGNOSIS — Z4800 Encounter for change or removal of nonsurgical wound dressing: Secondary | ICD-10-CM | POA: Diagnosis not present

## 2021-06-21 DIAGNOSIS — L89620 Pressure ulcer of left heel, unstageable: Secondary | ICD-10-CM | POA: Diagnosis not present

## 2021-06-21 DIAGNOSIS — L89150 Pressure ulcer of sacral region, unstageable: Secondary | ICD-10-CM | POA: Diagnosis not present

## 2021-06-21 DIAGNOSIS — K449 Diaphragmatic hernia without obstruction or gangrene: Secondary | ICD-10-CM | POA: Diagnosis not present

## 2021-06-21 DIAGNOSIS — H409 Unspecified glaucoma: Secondary | ICD-10-CM | POA: Diagnosis not present

## 2021-06-21 DIAGNOSIS — N182 Chronic kidney disease, stage 2 (mild): Secondary | ICD-10-CM | POA: Diagnosis not present

## 2021-06-21 DIAGNOSIS — Z7984 Long term (current) use of oral hypoglycemic drugs: Secondary | ICD-10-CM | POA: Diagnosis not present

## 2021-06-21 DIAGNOSIS — D126 Benign neoplasm of colon, unspecified: Secondary | ICD-10-CM | POA: Diagnosis not present

## 2021-06-21 DIAGNOSIS — K2270 Barrett's esophagus without dysplasia: Secondary | ICD-10-CM | POA: Diagnosis not present

## 2021-06-21 DIAGNOSIS — Z993 Dependence on wheelchair: Secondary | ICD-10-CM | POA: Diagnosis not present

## 2021-06-21 DIAGNOSIS — Z87891 Personal history of nicotine dependence: Secondary | ICD-10-CM | POA: Diagnosis not present

## 2021-06-21 DIAGNOSIS — K5790 Diverticulosis of intestine, part unspecified, without perforation or abscess without bleeding: Secondary | ICD-10-CM | POA: Diagnosis not present

## 2021-06-21 DIAGNOSIS — N6019 Diffuse cystic mastopathy of unspecified breast: Secondary | ICD-10-CM | POA: Diagnosis not present

## 2021-06-21 DIAGNOSIS — E7800 Pure hypercholesterolemia, unspecified: Secondary | ICD-10-CM | POA: Diagnosis not present

## 2021-06-21 DIAGNOSIS — Z9181 History of falling: Secondary | ICD-10-CM | POA: Diagnosis not present

## 2021-06-21 DIAGNOSIS — L89612 Pressure ulcer of right heel, stage 2: Secondary | ICD-10-CM | POA: Diagnosis not present

## 2021-06-22 DIAGNOSIS — H409 Unspecified glaucoma: Secondary | ICD-10-CM | POA: Diagnosis not present

## 2021-06-22 DIAGNOSIS — L89620 Pressure ulcer of left heel, unstageable: Secondary | ICD-10-CM | POA: Diagnosis not present

## 2021-06-22 DIAGNOSIS — L89612 Pressure ulcer of right heel, stage 2: Secondary | ICD-10-CM | POA: Diagnosis not present

## 2021-06-22 DIAGNOSIS — E049 Nontoxic goiter, unspecified: Secondary | ICD-10-CM | POA: Diagnosis not present

## 2021-06-22 DIAGNOSIS — Z7984 Long term (current) use of oral hypoglycemic drugs: Secondary | ICD-10-CM | POA: Diagnosis not present

## 2021-06-22 DIAGNOSIS — E7800 Pure hypercholesterolemia, unspecified: Secondary | ICD-10-CM | POA: Diagnosis not present

## 2021-06-22 DIAGNOSIS — D126 Benign neoplasm of colon, unspecified: Secondary | ICD-10-CM | POA: Diagnosis not present

## 2021-06-22 DIAGNOSIS — K449 Diaphragmatic hernia without obstruction or gangrene: Secondary | ICD-10-CM | POA: Diagnosis not present

## 2021-06-22 DIAGNOSIS — N6019 Diffuse cystic mastopathy of unspecified breast: Secondary | ICD-10-CM | POA: Diagnosis not present

## 2021-06-22 DIAGNOSIS — Z9181 History of falling: Secondary | ICD-10-CM | POA: Diagnosis not present

## 2021-06-22 DIAGNOSIS — Z993 Dependence on wheelchair: Secondary | ICD-10-CM | POA: Diagnosis not present

## 2021-06-22 DIAGNOSIS — K5790 Diverticulosis of intestine, part unspecified, without perforation or abscess without bleeding: Secondary | ICD-10-CM | POA: Diagnosis not present

## 2021-06-22 DIAGNOSIS — N182 Chronic kidney disease, stage 2 (mild): Secondary | ICD-10-CM | POA: Diagnosis not present

## 2021-06-22 DIAGNOSIS — E1122 Type 2 diabetes mellitus with diabetic chronic kidney disease: Secondary | ICD-10-CM | POA: Diagnosis not present

## 2021-06-22 DIAGNOSIS — K2270 Barrett's esophagus without dysplasia: Secondary | ICD-10-CM | POA: Diagnosis not present

## 2021-06-22 DIAGNOSIS — Z4800 Encounter for change or removal of nonsurgical wound dressing: Secondary | ICD-10-CM | POA: Diagnosis not present

## 2021-06-22 DIAGNOSIS — I10 Essential (primary) hypertension: Secondary | ICD-10-CM | POA: Diagnosis not present

## 2021-06-22 DIAGNOSIS — L89150 Pressure ulcer of sacral region, unstageable: Secondary | ICD-10-CM | POA: Diagnosis not present

## 2021-06-22 DIAGNOSIS — Z87891 Personal history of nicotine dependence: Secondary | ICD-10-CM | POA: Diagnosis not present

## 2021-06-26 DIAGNOSIS — E049 Nontoxic goiter, unspecified: Secondary | ICD-10-CM | POA: Diagnosis not present

## 2021-06-26 DIAGNOSIS — L89150 Pressure ulcer of sacral region, unstageable: Secondary | ICD-10-CM | POA: Diagnosis not present

## 2021-06-26 DIAGNOSIS — Z87891 Personal history of nicotine dependence: Secondary | ICD-10-CM | POA: Diagnosis not present

## 2021-06-26 DIAGNOSIS — D126 Benign neoplasm of colon, unspecified: Secondary | ICD-10-CM | POA: Diagnosis not present

## 2021-06-26 DIAGNOSIS — H409 Unspecified glaucoma: Secondary | ICD-10-CM | POA: Diagnosis not present

## 2021-06-26 DIAGNOSIS — E7800 Pure hypercholesterolemia, unspecified: Secondary | ICD-10-CM | POA: Diagnosis not present

## 2021-06-26 DIAGNOSIS — Z4800 Encounter for change or removal of nonsurgical wound dressing: Secondary | ICD-10-CM | POA: Diagnosis not present

## 2021-06-26 DIAGNOSIS — L89612 Pressure ulcer of right heel, stage 2: Secondary | ICD-10-CM | POA: Diagnosis not present

## 2021-06-26 DIAGNOSIS — L89620 Pressure ulcer of left heel, unstageable: Secondary | ICD-10-CM | POA: Diagnosis not present

## 2021-06-26 DIAGNOSIS — N182 Chronic kidney disease, stage 2 (mild): Secondary | ICD-10-CM | POA: Diagnosis not present

## 2021-06-26 DIAGNOSIS — I10 Essential (primary) hypertension: Secondary | ICD-10-CM | POA: Diagnosis not present

## 2021-06-26 DIAGNOSIS — K5790 Diverticulosis of intestine, part unspecified, without perforation or abscess without bleeding: Secondary | ICD-10-CM | POA: Diagnosis not present

## 2021-06-26 DIAGNOSIS — Z7984 Long term (current) use of oral hypoglycemic drugs: Secondary | ICD-10-CM | POA: Diagnosis not present

## 2021-06-26 DIAGNOSIS — K449 Diaphragmatic hernia without obstruction or gangrene: Secondary | ICD-10-CM | POA: Diagnosis not present

## 2021-06-26 DIAGNOSIS — Z9181 History of falling: Secondary | ICD-10-CM | POA: Diagnosis not present

## 2021-06-26 DIAGNOSIS — N6019 Diffuse cystic mastopathy of unspecified breast: Secondary | ICD-10-CM | POA: Diagnosis not present

## 2021-06-26 DIAGNOSIS — Z993 Dependence on wheelchair: Secondary | ICD-10-CM | POA: Diagnosis not present

## 2021-06-26 DIAGNOSIS — E1122 Type 2 diabetes mellitus with diabetic chronic kidney disease: Secondary | ICD-10-CM | POA: Diagnosis not present

## 2021-06-26 DIAGNOSIS — K2270 Barrett's esophagus without dysplasia: Secondary | ICD-10-CM | POA: Diagnosis not present

## 2021-06-28 DIAGNOSIS — N6019 Diffuse cystic mastopathy of unspecified breast: Secondary | ICD-10-CM | POA: Diagnosis not present

## 2021-06-28 DIAGNOSIS — Z7984 Long term (current) use of oral hypoglycemic drugs: Secondary | ICD-10-CM | POA: Diagnosis not present

## 2021-06-28 DIAGNOSIS — K5790 Diverticulosis of intestine, part unspecified, without perforation or abscess without bleeding: Secondary | ICD-10-CM | POA: Diagnosis not present

## 2021-06-28 DIAGNOSIS — D126 Benign neoplasm of colon, unspecified: Secondary | ICD-10-CM | POA: Diagnosis not present

## 2021-06-28 DIAGNOSIS — E049 Nontoxic goiter, unspecified: Secondary | ICD-10-CM | POA: Diagnosis not present

## 2021-06-28 DIAGNOSIS — E1122 Type 2 diabetes mellitus with diabetic chronic kidney disease: Secondary | ICD-10-CM | POA: Diagnosis not present

## 2021-06-28 DIAGNOSIS — K2270 Barrett's esophagus without dysplasia: Secondary | ICD-10-CM | POA: Diagnosis not present

## 2021-06-28 DIAGNOSIS — E7800 Pure hypercholesterolemia, unspecified: Secondary | ICD-10-CM | POA: Diagnosis not present

## 2021-06-28 DIAGNOSIS — L89150 Pressure ulcer of sacral region, unstageable: Secondary | ICD-10-CM | POA: Diagnosis not present

## 2021-06-28 DIAGNOSIS — N182 Chronic kidney disease, stage 2 (mild): Secondary | ICD-10-CM | POA: Diagnosis not present

## 2021-06-28 DIAGNOSIS — Z993 Dependence on wheelchair: Secondary | ICD-10-CM | POA: Diagnosis not present

## 2021-06-28 DIAGNOSIS — L89612 Pressure ulcer of right heel, stage 2: Secondary | ICD-10-CM | POA: Diagnosis not present

## 2021-06-28 DIAGNOSIS — H409 Unspecified glaucoma: Secondary | ICD-10-CM | POA: Diagnosis not present

## 2021-06-28 DIAGNOSIS — Z9181 History of falling: Secondary | ICD-10-CM | POA: Diagnosis not present

## 2021-06-28 DIAGNOSIS — K449 Diaphragmatic hernia without obstruction or gangrene: Secondary | ICD-10-CM | POA: Diagnosis not present

## 2021-06-28 DIAGNOSIS — L89620 Pressure ulcer of left heel, unstageable: Secondary | ICD-10-CM | POA: Diagnosis not present

## 2021-06-28 DIAGNOSIS — Z4800 Encounter for change or removal of nonsurgical wound dressing: Secondary | ICD-10-CM | POA: Diagnosis not present

## 2021-06-28 DIAGNOSIS — I10 Essential (primary) hypertension: Secondary | ICD-10-CM | POA: Diagnosis not present

## 2021-06-28 DIAGNOSIS — Z87891 Personal history of nicotine dependence: Secondary | ICD-10-CM | POA: Diagnosis not present

## 2021-07-01 ENCOUNTER — Other Ambulatory Visit: Payer: Medicare Other | Admitting: Family Medicine

## 2021-07-01 ENCOUNTER — Encounter: Payer: Self-pay | Admitting: Family Medicine

## 2021-07-01 VITALS — BP 122/90 | HR 67 | Resp 16

## 2021-07-01 DIAGNOSIS — L89509 Pressure ulcer of unspecified ankle, unspecified stage: Secondary | ICD-10-CM

## 2021-07-01 DIAGNOSIS — G8929 Other chronic pain: Secondary | ICD-10-CM | POA: Diagnosis not present

## 2021-07-01 DIAGNOSIS — Z515 Encounter for palliative care: Secondary | ICD-10-CM | POA: Diagnosis not present

## 2021-07-01 DIAGNOSIS — M25561 Pain in right knee: Secondary | ICD-10-CM | POA: Diagnosis not present

## 2021-07-01 DIAGNOSIS — M25562 Pain in left knee: Secondary | ICD-10-CM | POA: Diagnosis not present

## 2021-07-01 NOTE — Progress Notes (Signed)
? ? ?Manufacturing engineer ?Community Palliative Care Consult Note ?Telephone: [phone]  ?Fax: [phone]  ? ? ?Date of encounter: 07/01/21 ?1:56 PM ?PATIENT NAME: [name] ?[place] [address]   ?[phone] (home)  ?DOB: Jan 01, 1932 ?MRN: [id number] ?PRIMARY CARE PROVIDER:    ?Kelton Pillar, MD,  ?[address] ?[place] [address] ?[phone] ? ?REFERRING PROVIDER:   ?Kelton Pillar, MD ?[address] ?[address] ?[place],  [place] [address] ?[phone] ? ?RESPONSIBLE PARTY:    ?Contact Information   ? ? Name Relation Home Work Mobile  ? Kandis Cocking Daughter [phone]    ? ?  ? ? ? ?I met face to face with patient, granddaughter and caregiver in her home. Palliative Care was asked to follow this patient by consultation request of  Kelton Pillar, MD to address advance care planning and complex medical decision making. This is a follow up visit. ? ?                                 ASSESSMENT , SYMPTOM MANAGEMENT AND PLAN / RECOMMENDATIONS:  ? Palliative Care Encounter ?Discuss with pt's daughter goals of care on next visit. ?Stable currently with no unmet symptom management needs at present. ? ?2.   Pressure Ulcer bilateral heels ?Healing per family at input from visiting nurse. ?Continue to elevate BLE, float heels and use lambskin heel protectors. ? ?3.  Chronic knee pain ?Left greater than right per granddaughter with some swelling intermittently of left foot. ?Continue current pain regimen of Tramadol and tylenol as pt reports effective pain control. ? ? ? ?Advance Care Planning/Goals of Care: . ?CODE STATUS: ?Full Code at present as no other status listed.  Will address at future visit when daughter present. ? ? ? ?Follow up Palliative Care Visit: Palliative care will continue to follow for complex medical decision making, advance care planning, and clarification of goals. Return 4 weeks or prn. Attempted to call daughter [name] at 8:04 pm and  message said her voice mail was full and would not accept messages.   ? ? ? ?This visit was coded based on medical decision making (MDM). ? ?PPS: 50% ? ?HOSPICE ELIGIBILITY/DIAGNOSIS: TBD ? ?Chief Complaint:  ?Palliative Care is following for chronic management in setting of dementia with assistance to follow up for advance care planning and refining goals of care. ? ?HISTORY OF PRESENT ILLNESS:  [name] is a 88 y.o. year old female with dementia, cervical myelopathy, Diabetes mellitus type 2 with CKD, OA of multiple joints,  bladder atony with incontinence, obesity, hx of hiatal hernia, non-toxicc goiter, diverticulosis, GERD, Barrett's esophagus, pulmonary hypertension, hx of right intraductal breast cancer, left breast cancer s/p lumpectomy, glaucoma and hyperlipidemia.  Granddaughter and caregiver present and state no med changes, appetite is good.  No pain or falls.  Mood is good.   WC bound with wounds to bilat heels and buttocks healing per visiting nursing agency (according to caregiver).   No recent labs or imaging since Dec 2022.   ? ?History obtained from review of EMR, discussion with granddaughter, caregiver and/or Ms. Rovner.  ?I reviewed available labs, medications, imaging, studies and related documents from the EMR.  Records reviewed and summarized above.  ? ?ROS ?Obtained from caregiver and granddaughter as per above, pt unable to give hx- ? ? ?Physical Exam: ?Current and past weights: 170 lbs as of 03/08/21 ?  Constitutional: NAD ?General: obese  ?EYES: anicteric sclera, lids intact, no discharge  ?ENMT: intact hearing, oral mucous membranes moist, dentition intact ?CV: S1S2, RRR with Left sternal border murmur, no LE edema ?Pulmonary: CTAB, no increased work of breathing, no cough, room air ?Abdomen: hypo-active BS + 4 quadrants, soft and non tender, no ascites ?GU: deferred ?MSK: no sarcopenia, moves all extremities ?Skin: warm and dry, no rashes or wounds on visible skin ?Neuro:  no generalized  weakness,  no cognitive impairment ?Psych: non-anxious affect, A and O x 3 ?Hem/lymph/immuno: no widespread bruising ? ? ?Thank you for the opportunity to participate in the care of [name].  The palliative care team will continue to follow. Please call our office at [phone] if we can be of additional assistance.  ? ?Marijo Conception, FNP-C ? ?COVID-19 PATIENT SCREENING TOOL ?Asked and negative response unless otherwise noted:  ? ?Have you had symptoms of covid, tested positive or been in contact with someone with symptoms/positive test in the past 5-10 days?  No ?

## 2021-07-04 DIAGNOSIS — K2270 Barrett's esophagus without dysplasia: Secondary | ICD-10-CM | POA: Diagnosis not present

## 2021-07-04 DIAGNOSIS — K449 Diaphragmatic hernia without obstruction or gangrene: Secondary | ICD-10-CM | POA: Diagnosis not present

## 2021-07-04 DIAGNOSIS — D126 Benign neoplasm of colon, unspecified: Secondary | ICD-10-CM | POA: Diagnosis not present

## 2021-07-04 DIAGNOSIS — Z7984 Long term (current) use of oral hypoglycemic drugs: Secondary | ICD-10-CM | POA: Diagnosis not present

## 2021-07-04 DIAGNOSIS — Z993 Dependence on wheelchair: Secondary | ICD-10-CM | POA: Diagnosis not present

## 2021-07-04 DIAGNOSIS — L89620 Pressure ulcer of left heel, unstageable: Secondary | ICD-10-CM | POA: Diagnosis not present

## 2021-07-04 DIAGNOSIS — H409 Unspecified glaucoma: Secondary | ICD-10-CM | POA: Diagnosis not present

## 2021-07-04 DIAGNOSIS — Z9181 History of falling: Secondary | ICD-10-CM | POA: Diagnosis not present

## 2021-07-04 DIAGNOSIS — Z87891 Personal history of nicotine dependence: Secondary | ICD-10-CM | POA: Diagnosis not present

## 2021-07-04 DIAGNOSIS — K5790 Diverticulosis of intestine, part unspecified, without perforation or abscess without bleeding: Secondary | ICD-10-CM | POA: Diagnosis not present

## 2021-07-04 DIAGNOSIS — E049 Nontoxic goiter, unspecified: Secondary | ICD-10-CM | POA: Diagnosis not present

## 2021-07-04 DIAGNOSIS — I10 Essential (primary) hypertension: Secondary | ICD-10-CM | POA: Diagnosis not present

## 2021-07-04 DIAGNOSIS — E1122 Type 2 diabetes mellitus with diabetic chronic kidney disease: Secondary | ICD-10-CM | POA: Diagnosis not present

## 2021-07-04 DIAGNOSIS — N182 Chronic kidney disease, stage 2 (mild): Secondary | ICD-10-CM | POA: Diagnosis not present

## 2021-07-04 DIAGNOSIS — L89612 Pressure ulcer of right heel, stage 2: Secondary | ICD-10-CM | POA: Diagnosis not present

## 2021-07-04 DIAGNOSIS — N6019 Diffuse cystic mastopathy of unspecified breast: Secondary | ICD-10-CM | POA: Diagnosis not present

## 2021-07-04 DIAGNOSIS — L89150 Pressure ulcer of sacral region, unstageable: Secondary | ICD-10-CM | POA: Diagnosis not present

## 2021-07-04 DIAGNOSIS — E7800 Pure hypercholesterolemia, unspecified: Secondary | ICD-10-CM | POA: Diagnosis not present

## 2021-07-04 DIAGNOSIS — Z4800 Encounter for change or removal of nonsurgical wound dressing: Secondary | ICD-10-CM | POA: Diagnosis not present

## 2021-07-05 ENCOUNTER — Telehealth: Payer: Self-pay | Admitting: Cardiology

## 2021-07-05 NOTE — Telephone Encounter (Signed)
Will route this message to Dr. Johney Frame for further review and advisement on visit type. ?Will follow-up accordingly thereafter with the pt and daughter.  ?

## 2021-07-05 NOTE — Telephone Encounter (Signed)
Pt's daughter called regarding pt's appt on 07/10/21. Daughter states that pt is pretty much bed bound and wanted to know if it was possible for pt's appt to be a video appt  or telephone appt. Please advise ?

## 2021-07-06 DIAGNOSIS — N6019 Diffuse cystic mastopathy of unspecified breast: Secondary | ICD-10-CM | POA: Diagnosis not present

## 2021-07-06 DIAGNOSIS — L89612 Pressure ulcer of right heel, stage 2: Secondary | ICD-10-CM | POA: Diagnosis not present

## 2021-07-06 DIAGNOSIS — I10 Essential (primary) hypertension: Secondary | ICD-10-CM | POA: Diagnosis not present

## 2021-07-06 DIAGNOSIS — E7800 Pure hypercholesterolemia, unspecified: Secondary | ICD-10-CM | POA: Diagnosis not present

## 2021-07-06 DIAGNOSIS — Z7984 Long term (current) use of oral hypoglycemic drugs: Secondary | ICD-10-CM | POA: Diagnosis not present

## 2021-07-06 DIAGNOSIS — Z993 Dependence on wheelchair: Secondary | ICD-10-CM | POA: Diagnosis not present

## 2021-07-06 DIAGNOSIS — K5790 Diverticulosis of intestine, part unspecified, without perforation or abscess without bleeding: Secondary | ICD-10-CM | POA: Diagnosis not present

## 2021-07-06 DIAGNOSIS — Z87891 Personal history of nicotine dependence: Secondary | ICD-10-CM | POA: Diagnosis not present

## 2021-07-06 DIAGNOSIS — K2270 Barrett's esophagus without dysplasia: Secondary | ICD-10-CM | POA: Diagnosis not present

## 2021-07-06 DIAGNOSIS — D126 Benign neoplasm of colon, unspecified: Secondary | ICD-10-CM | POA: Diagnosis not present

## 2021-07-06 DIAGNOSIS — H409 Unspecified glaucoma: Secondary | ICD-10-CM | POA: Diagnosis not present

## 2021-07-06 DIAGNOSIS — N182 Chronic kidney disease, stage 2 (mild): Secondary | ICD-10-CM | POA: Diagnosis not present

## 2021-07-06 DIAGNOSIS — K449 Diaphragmatic hernia without obstruction or gangrene: Secondary | ICD-10-CM | POA: Diagnosis not present

## 2021-07-06 DIAGNOSIS — E049 Nontoxic goiter, unspecified: Secondary | ICD-10-CM | POA: Diagnosis not present

## 2021-07-06 DIAGNOSIS — Z9181 History of falling: Secondary | ICD-10-CM | POA: Diagnosis not present

## 2021-07-06 DIAGNOSIS — Z4800 Encounter for change or removal of nonsurgical wound dressing: Secondary | ICD-10-CM | POA: Diagnosis not present

## 2021-07-06 DIAGNOSIS — L89620 Pressure ulcer of left heel, unstageable: Secondary | ICD-10-CM | POA: Diagnosis not present

## 2021-07-06 DIAGNOSIS — E1122 Type 2 diabetes mellitus with diabetic chronic kidney disease: Secondary | ICD-10-CM | POA: Diagnosis not present

## 2021-07-06 DIAGNOSIS — L89150 Pressure ulcer of sacral region, unstageable: Secondary | ICD-10-CM | POA: Diagnosis not present

## 2021-07-06 NOTE — Progress Notes (Deleted)
?Cardiology Office Note:   ? ?Date:  07/06/2021  ? ?ID:  [name], DOB [birth year], MRN [id number] ? ?PCP:  Kelton Pillar, MD ?  ?[facility] Prov[facility] ?Cardiologist:  Fransico Him, MD { ? ? ?Referring MD: Kelton Pillar, MD  ? ? ?History of Present Illness:   ? ?[name] is a 88 y.o. female with a hx of Barretts esophagus, breast cancer, CKD stage 2, DMII, dementia, HTN, HLD and pulmonary HTN who  presents to clinic for follow-up. ? ?Was last seen in clinic 02/2021 for SOB and LE edema. TTE 04/2021 showed LVEF 55-60%, normal RV, PASP 33.65mHg, trivial MR, mild AS.  ? ?Today, the patient is being seen via telehealth visit. The patient is overall stable. Home nursing comes out and her vital signs have been within normal limits. Has been compliant with her medications. No chest pain, shortness of breath or palpitations.  ? ?Past Medical History:  ?Diagnosis Date  ? Arthritis   ? knees  ? Barrett esophagus   ? Bladder atony   ? uses a large pad for incontinence   ? 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 Change in mental status   ? CKD (chronic kidney disease) stage 2, GFR 60-89 ml/min   ? Dementia ([facility]   ? Diabetes mellitus with chronic kidney disease ([facility]   ? Diverticulosis   ? Fibrocystic change of breast   ? GERD (gastroesophageal reflux disease)   ? Uses PPI- only on occasion   ? Glaucoma   ? History of hiatal hernia   ? Hypercholesteremia   ? Hypertension   ? for treatment, pt. unsure why this is noted in her hx.    ? Intraductal carcinoma in situ of right breast   ? Memory loss   ? Mild mood disorder ([facility]   ? Non-toxic goiter   ? Normal cardiac stress test 2000  ? [facility] ? Obesity   ? Osteoarthritis   ? Personal history of malignant neoplasm of breast   ? Pulmonary hypertension ([facility]   ? Thyroid nodule 2007  ? biopsy- benign  ? Trouble walking   ? UI (urinary incontinence)   ? Visual hallucinations   ? Vitamin D deficiency   ? ? ?Past Surgical History:  ?Procedure Laterality Date  ?  ADENOIDECTOMY    ? BREAST BIOPSY  1974  ? right  ? BREAST BIOPSY  1977  ? left  ? BREAST BIOPSY  2012  ? Low grade ductal carcinoma ( needle loc Dr HMelanee Spry  ? BREAST EXCISIONAL BIOPSY Right   ? BREAST LUMPECTOMY  01/22/2011  ? BREAST LUMPECTOMY  114/14/2012  ? Procedure: LUMPECTOMY;  Surgeon: CHaywood Lasso MD;  [facility]  Service: General;  Laterality: Left;  Needle localization  @ [facility]  12:30  ? BREAST LUMPECTOMY W/ NEEDLE LOCALIZATION  01/22/2011  ? Left - Dr SMargot Chimes ? COLONOSCOPY  2009  ? EYE SURGERY Left   ? laser for glaucoma  ? TONSILLECTOMY  1944  ? TOTAL KNEE ARTHROPLASTY Right 07/09/2015  ? Procedure: RIGHT TOTAL KNEE ARTHROPLASTY;  Surgeon: SVickey Huger MD;  [facility]  Service: Orthopedics;  Laterality: Right;  ? VAGINAL HYSTERECTOMY    ? partial  ? ? ?Current Medications: ?No outpatient medications have been marked as taking for the 07/10/21 encounter (Appointment) with PFreada Bergeron MD.  ?  ? ?Allergies:   Peanut-containing drug products, Penicillins, Shellfish allergy, and Tape  ? ?Social History  ? ?  Socioeconomic History  ? Marital status: Divorced  ?  Spouse name: Not on file  ? Number of children: 1  ? Years of education: Not on file  ? Highest education level: Not on file  ?Occupational History  ? Occupation: college professor  ?  Comment: Retired  ?  Employer: RETIRED  ?Tobacco Use  ? Smoking status: Former  ?  Types: Cigarettes  ?  Quit date: 03/10/1981  ?  Years since quitting: 40.3  ? Smokeless tobacco: Never  ?Vaping Use  ? Vaping Use: Never used  ?Substance and Sexual Activity  ? Alcohol use: No  ? Drug use: No  ? Sexual activity: Not Currently  ?Other Topics Concern  ? Not on file  ?Social History Narrative  ? Lives with daughter and two grandchildren  ?   ? College education  ?   ? Left handed  ?   ?   ?   ? ?Social Determinants of Health  ? ?Financial Resource Strain: Not on file  ?Food Insecurity: Not on file  ?Transportation Needs: Unmet Transportation Needs  ? Lack of  Transportation (Medical): Yes  ? Lack of Transportation (Non-Medical): No  ?Physical Activity: Not on file  ?Stress: Not on file  ?Social Connections: Not on file  ?  ? ?Family History: ?The patient's family history includes Breast cancer in her maternal grandmother; Cancer in her paternal aunt; Cancer (age of onset: 25) in her maternal grandmother; Cancer (age of onset: 60) in her paternal uncle. ? ?ROS:   ?Please see the history of present illness.    ?Review of Systems  ?Constitutional:  Negative for fever.  ?Respiratory:  Positive for shortness of breath.   ?Cardiovascular:  Positive for leg swelling. Negative for chest pain, palpitations, orthopnea, claudication and PND.  ?Gastrointestinal:  Negative for nausea and vomiting.  ?Musculoskeletal:  Positive for falls.  ?Neurological:  Negative for dizziness and loss of consciousness.   ? ?EKGs/Labs/Other Studies Reviewed:   ? ?The following studies were reviewed today: ?TTE 04/2021: ?IMPRESSIONS  ? ? ? 1. Left ventricular ejection fraction, by estimation, is 55 to 60%. The  ?left ventricle has normal function. Left ventricular endocardial border  ?not optimally defined to evaluate regional wall motion. Left ventricular  ?diastolic parameters are  ?indeterminate.  ? 2. Right ventricular systolic function is normal. The right ventricular  ?size is normal. There is normal pulmonary artery systolic pressure. The  ?estimated right ventricular systolic pressure is 29.5 mmHg.  ? 3. The mitral valve is grossly normal. Trivial mitral valve  ?regurgitation. No evidence of mitral stenosis.  ? 4. The aortic valve is calcified. Aortic valve regurgitation is not  ?visualized. Mild aortic valve stenosis. Aortic valve mean gradient  ?measures 15.4 mmHg. Aortic valve Vmax measures 2.55 m/s.  ? 5. The inferior vena cava is normal in size with greater than 50%  ?respiratory variability, suggesting right atrial pressure of 3 mmHg. ? ?EKG:  No new tracing ? ?Recent Labs: ?10/27/2020:  ALT 11 ?02/21/2021: BUN 16; Creatinine, Ser 0.81; Hemoglobin 9.9; NT-Pro BNP 306; Platelets 264; Potassium 4.0; Sodium 145; TSH 2.340  ?Recent Lipid Panel ?No results found for: CHOL, TRIG, HDL, CHOLHDL, VLDL, LDLCALC, LDLDIRECT ? ? ?Risk Assessment/Calculations:   ?  ? ?    ? ?Physical Exam:   ? ?VS:  There were no vitals taken for this visit.   ? ?Wt Readings from Last 3 Encounters:  ?03/08/21 170 lb (77.1 kg)  ?10/27/20 196 lb 10.4 oz (89.2 kg)  ?  06/12/20 196 lb 9.6 oz (89.2 kg)  ?  ? ?No exam today due to telehealth visit ? ?ASSESSMENT:   ? ?No diagnosis found. ? ?PLAN:   ? ?In order of problems listed above: ? ?#SOB ?#LE Edema: ?TTE reassuring with normal BiV function. Has mild AS but no significant MR. Essentially bedbound at this point. Will continue with conservative management.  ? ?#HLD: ?-Simva '40mg'$  daily ? ?#DMII: ?A1C 6 on metformin. ?-Continue metformin ? ?#Mild AS: ?Noted on TTE, mean gradient 15.49mHg, Vmax 2.569m. Given age and combordities, will manage conservatively. ? ?#GOC: ?Essentially bedbound at this point. Will continue with conservative management.  ? ?   ?Medication Adjustments/Labs and Tests Ordered: ?Current medicines are reviewed at length with the patient today.  Concerns regarding medicines are outlined above.  ?No orders of the defined types were placed in this encounter. ? ?No orders of the defined types were placed in this encounter. ? ? ?There are no Patient Instructions on file for this visit. ?  ? ?Signed, ?HeFreada BergeronMD  ?07/06/2021 3:19 PM    ?[place] ?

## 2021-07-07 NOTE — Telephone Encounter (Signed)
Okay with me 

## 2021-07-08 ENCOUNTER — Telehealth: Payer: Self-pay | Admitting: *Deleted

## 2021-07-08 DIAGNOSIS — L89623 Pressure ulcer of left heel, stage 3: Secondary | ICD-10-CM | POA: Diagnosis not present

## 2021-07-08 DIAGNOSIS — R262 Difficulty in walking, not elsewhere classified: Secondary | ICD-10-CM | POA: Diagnosis not present

## 2021-07-08 DIAGNOSIS — L89312 Pressure ulcer of right buttock, stage 2: Secondary | ICD-10-CM | POA: Diagnosis not present

## 2021-07-08 DIAGNOSIS — L89303 Pressure ulcer of unspecified buttock, stage 3: Secondary | ICD-10-CM | POA: Diagnosis not present

## 2021-07-08 DIAGNOSIS — L89893 Pressure ulcer of other site, stage 3: Secondary | ICD-10-CM | POA: Diagnosis not present

## 2021-07-08 DIAGNOSIS — M1990 Unspecified osteoarthritis, unspecified site: Secondary | ICD-10-CM | POA: Diagnosis not present

## 2021-07-08 NOTE — Telephone Encounter (Signed)
Will route this back to scheduling dept with the ok to switch the pts OV with Dr. Johney Frame on 5/3, to a telephone visit.  ?

## 2021-07-08 NOTE — Telephone Encounter (Signed)
?  Patient Consent for Virtual Visit  ? ? ?   ? ?[name] has provided verbal consent on 07/08/2021 for a virtual visit (video or telephone). ? ? ?CONSENT FOR VIRTUAL VISIT FOR:  [name]  ?By participating in this virtual visit I agree to the following: ? ?I hereby voluntarily request, consent and authorize [facility] and its employed or contracted physicians, physician assistants, nurse practitioners or other licensed health care professionals (the Practitioner), to provide me with telemedicine health care services (the ?Services") as deemed necessary by the treating Practitioner. I acknowledge and consent to receive the Services by the Practitioner via telemedicine. I understand that the telemedicine visit will involve communicating with the Practitioner through live audiovisual communication technology and the disclosure of certain medical information by electronic transmission. I acknowledge that I have been given the opportunity to request an in-person assessment or other available alternative prior to the telemedicine visit and am voluntarily participating in the telemedicine visit. ? ?I understand that I have the right to withhold or withdraw my consent to the use of telemedicine in the course of my care at any time, without affecting my right to future care or treatment, and that the Practitioner or I may terminate the telemedicine visit at any time. I understand that I have the right to inspect all information obtained and/or recorded in the course of the telemedicine visit and may receive copies of available information for a reasonable fee.  I understand that some of the potential risks of receiving the Services via telemedicine include:  ?Delay or interruption in medical evaluation due to technological equipment failure or disruption; ?Information transmitted may not be sufficient (e.g. poor resolution of images) to allow for appropriate medical decision making by the Practitioner; and/or  ?In  rare instances, security protocols could fail, causing a breach of personal health information. ? ?Furthermore, I acknowledge that it is my responsibility to provide information about my medical history, conditions and care that is complete and accurate to the best of my ability. I acknowledge that Practitioner's advice, recommendations, and/or decision may be based on factors not within their control, such as incomplete or inaccurate data provided by me or distortions of diagnostic images or specimens that may result from electronic transmissions. I understand that the practice of medicine is not an exact science and that Practitioner makes no warranties or guarantees regarding treatment outcomes. I acknowledge that a copy of this consent can be made available to me via my patient portal ([facility]), or I can request a printed copy by calling the office of [facility].   ? ?I understand that my insurance will be billed for this visit.  ? ?I have read or had this consent read to me. ?I understand the contents of this consent, which adequately explains the benefits and risks of the Services being provided via telemedicine.  ?I have been provided ample opportunity to ask questions regarding this consent and the Services and have had my questions answered to my satisfaction. ?I give my informed consent for the services to be provided through the use of telemedicine in my medical care ? ? ? ? ?

## 2021-07-10 ENCOUNTER — Telehealth (INDEPENDENT_AMBULATORY_CARE_PROVIDER_SITE_OTHER): Payer: Medicare Other | Admitting: Cardiology

## 2021-07-10 ENCOUNTER — Encounter: Payer: Self-pay | Admitting: Cardiology

## 2021-07-10 VITALS — Ht 63.0 in | Wt 170.0 lb

## 2021-07-10 DIAGNOSIS — E782 Mixed hyperlipidemia: Secondary | ICD-10-CM

## 2021-07-10 DIAGNOSIS — R0602 Shortness of breath: Secondary | ICD-10-CM

## 2021-07-10 DIAGNOSIS — M1990 Unspecified osteoarthritis, unspecified site: Secondary | ICD-10-CM | POA: Diagnosis not present

## 2021-07-10 DIAGNOSIS — Z794 Long term (current) use of insulin: Secondary | ICD-10-CM

## 2021-07-10 DIAGNOSIS — I10 Essential (primary) hypertension: Secondary | ICD-10-CM | POA: Diagnosis not present

## 2021-07-10 DIAGNOSIS — E119 Type 2 diabetes mellitus without complications: Secondary | ICD-10-CM | POA: Diagnosis not present

## 2021-07-10 DIAGNOSIS — R600 Localized edema: Secondary | ICD-10-CM

## 2021-07-10 NOTE — Progress Notes (Signed)
? ?Virtual Visit via Telephone Note  ? ?This visit type was conducted due to national recommendations for restrictions regarding the COVID-19 Pandemic (e.g. social distancing) in an effort to limit this patient's exposure and mitigate transmission in our community.  Due to her co-morbid illnesses, this patient is at least at moderate risk for complications without adequate follow up.  This format is felt to be most appropriate for this patient at this time.  The patient did not have access to video technology/had technical difficulties with video requiring transitioning to audio format only (telephone).  All issues noted in this document were discussed and addressed.  No physical exam could be performed with this format.  Please refer to the patient's chart for her  consent to telehealth for [facility].  ? ?The patient was identified using 2 identifiers. ? ?Date:  07/10/2021  ? ?ID:  [name], DOB 1931-08-19, MRN [id number] ? ?Patient Location: Home ?Provider Location: Office/Clinic ? ?PCP:  Kelton Pillar, MD  ?Cardiologist:  Fransico Him, MD  ?Electrophysiologist:  None  ? ?Evaluation Performed:  Follow-Up Visit ? ?Chief Complaint:  Follow-up ? ? ?History of Present Illness:   ? ?[name] is a 88 y.o. female with a hx of Barretts esophagus, breast cancer, CKD stage 2, DMII, dementia, HTN, HLD and pulmonary HTN who presents for virtual follow-up. ? ?Was last seen in clinic 02/2021 for SOB and LE edema. TTE 04/2021 showed LVEF 55-60%, normal RV, PASP 33.67mHg, trivial MR, mild AS.  ? ?Today, the patient is being seen via telehealth visit. The patient is overall stable. Home nursing comes out and her vital signs have been within normal limits. Has been compliant with her medications. No chest pain, shortness of breath or palpitations. She is essentially bed-bound at this time and very minimal mobile.  ? ?She denies any palpitations, chest pain, shortness of breath. No lightheadedness, headaches, syncope,  orthopnea, or PND. ? ?The patient does not have symptoms concerning for COVID-19 infection (fever, chills, cough, or new shortness of breath).  ? ?Past Medical History:  ?Diagnosis Date  ? Arthritis   ? knees  ? Barrett esophagus   ? Bladder atony   ? uses a large pad for incontinence   ? 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 Change in mental status   ? CKD (chronic kidney disease) stage 2, GFR 60-89 ml/min   ? Dementia ([facility]   ? Diabetes mellitus with chronic kidney disease ([facility]   ? Diverticulosis   ? Fibrocystic change of breast   ? GERD (gastroesophageal reflux disease)   ? Uses PPI- only on occasion   ? Glaucoma   ? History of hiatal hernia   ? Hypercholesteremia   ? Hypertension   ? for treatment, pt. unsure why this is noted in her hx.    ? Intraductal carcinoma in situ of right breast   ? Memory loss   ? Mild mood disorder ([facility]   ? Non-toxic goiter   ? Normal cardiac stress test 2000  ? [facility] ? Obesity   ? Osteoarthritis   ? Personal history of malignant neoplasm of breast   ? Pulmonary hypertension ([facility]   ? Thyroid nodule 2007  ? biopsy- benign  ? Trouble walking   ? UI (urinary incontinence)   ? Visual hallucinations   ? Vitamin D deficiency   ? ? ?Past Surgical History:  ?Procedure Laterality Date  ? ADENOIDECTOMY    ? BREAST BIOPSY  1974  ?  right  ? BREAST BIOPSY  1977  ? left  ? BREAST BIOPSY  2012  ? Low grade ductal carcinoma ( needle loc Dr Melanee Spry)  ? BREAST EXCISIONAL BIOPSY Right   ? BREAST LUMPECTOMY  01/22/2011  ? BREAST LUMPECTOMY  114/14/2012  ? Procedure: LUMPECTOMY;  Surgeon: Haywood Lasso, MD;  Location: [facility];  Service: General;  Laterality: Left;  Needle localization  @ [facility]  12:30  ? BREAST LUMPECTOMY W/ NEEDLE LOCALIZATION  01/22/2011  ? Left - Dr Margot Chimes  ? COLONOSCOPY  2009  ? EYE SURGERY Left   ? laser for glaucoma  ? TONSILLECTOMY  1944  ? TOTAL KNEE ARTHROPLASTY Right 07/09/2015  ? Procedure: RIGHT TOTAL KNEE ARTHROPLASTY;  Surgeon: Vickey Huger, MD;  Location: [facility];   Service: Orthopedics;  Laterality: Right;  ? VAGINAL HYSTERECTOMY    ? partial  ? ? ?Current Medications: ?Current Meds  ?Medication Sig  ? Calcium Carbonate-Vitamin D (CALCIUM-D PO) Take 1 tablet by mouth daily.  ? escitalopram (LEXAPRO) 20 MG tablet Take 1 tablet (20 mg total) by mouth at bedtime.  ? Ferrous Sulfate (IRON PO) Take 65 mg by mouth.  ? memantine (NAMENDA) 10 MG tablet Take 1 tablet daily  ? metFORMIN (GLUCOPHAGE-XR) 500 MG 24 hr tablet Take 500 mg by mouth daily.  ? Multiple Vitamins-Minerals (MULTIVITAMIN WITH MINERALS) tablet Take 1 tablet by mouth daily.  ? simvastatin (ZOCOR) 40 MG tablet Take 40 mg by mouth daily.  ? TRAVATAN Z 0.004 % SOLN ophthalmic solution Place 1 drop into both eyes at bedtime.  ? traZODone (DESYREL) 50 MG tablet Take 1/2 tablet every night as needed for insomnia  ?  ? ?Allergies:   Peanut-containing drug products, Penicillins, Shellfish allergy, and Tape  ? ?Social History  ? ?Socioeconomic History  ? Marital status: Divorced  ?  Spouse name: Not on file  ? Number of children: 1  ? Years of education: Not on file  ? Highest education level: Not on file  ?Occupational History  ? Occupation: college professor  ?  Comment: Retired  ?  Employer: RETIRED  ?Tobacco Use  ? Smoking status: Former  ?  Types: Cigarettes  ?  Quit date: 03/10/1981  ?  Years since quitting: 40.3  ? Smokeless tobacco: Never  ?Vaping Use  ? Vaping Use: Never used  ?Substance and Sexual Activity  ? Alcohol use: No  ? Drug use: No  ? Sexual activity: Not Currently  ?Other Topics Concern  ? Not on file  ?Social History Narrative  ? Lives with daughter and two grandchildren  ?   ? College education  ?   ? Left handed  ?   ?   ?   ? ?Social Determinants of Health  ? ?Financial Resource Strain: Not on file  ?Food Insecurity: Not on file  ?Transportation Needs: Unmet Transportation Needs  ? Lack of Transportation (Medical): Yes  ? Lack of Transportation (Non-Medical): No  ?Physical Activity: Not on file  ?Stress:  Not on file  ?Social Connections: Not on file  ?  ? ?Family History: ?The patient's family history includes Breast cancer in her maternal grandmother; Cancer in her paternal aunt; Cancer (age of onset: 40) in her maternal grandmother; Cancer (age of onset: 31) in her paternal uncle. ? ?ROS:   ?Please see the history of present illness.    ?Review of Systems  ?Constitutional:  Negative for fever.  ?Respiratory:  Positive for shortness of breath.   ?Cardiovascular:  Positive for leg swelling. Negative for chest pain, palpitations, orthopnea, claudication and PND.  ?Gastrointestinal:  Negative for nausea and vomiting.  ?Musculoskeletal:  Positive for falls.  ?Neurological:  Negative for dizziness and loss of consciousness.   ? ?EKGs/Labs/Other Studies Reviewed:   ? ?The following studies were reviewed today: ? ?TTE 04/2021: ?IMPRESSIONS  ? ? 1. Left ventricular ejection fraction, by estimation, is 55 to 60%. The  ?left ventricle has normal function. Left ventricular endocardial border  ?not optimally defined to evaluate regional wall motion. Left ventricular  ?diastolic parameters are  ?indeterminate.  ? 2. Right ventricular systolic function is normal. The right ventricular  ?size is normal. There is normal pulmonary artery systolic pressure. The  ?estimated right ventricular systolic pressure is 35.7 mmHg.  ? 3. The mitral valve is grossly normal. Trivial mitral valve  ?regurgitation. No evidence of mitral stenosis.  ? 4. The aortic valve is calcified. Aortic valve regurgitation is not  ?visualized. Mild aortic valve stenosis. Aortic valve mean gradient  ?measures 15.4 mmHg. Aortic valve Vmax measures 2.55 m/s.  ? 5. The inferior vena cava is normal in size with greater than 50%  ?respiratory variability, suggesting right atrial pressure of 3 mmHg. ? ?EKG:  EKG is personally reviewed. ?No new tracing ? ?Recent Labs: ?10/27/2020: ALT 11 ?02/21/2021: BUN 16; Creatinine, Ser 0.81; Hemoglobin 9.9; NT-Pro BNP 306;  Platelets 264; Potassium 4.0; Sodium 145; TSH 2.340  ? ?Recent Lipid Panel ?No results found for: CHOL, TRIG, HDL, CHOLHDL, VLDL, LDLCALC, LDLDIRECT ? ? ?Risk Assessment/Calculations:   ?  ? ?    ? ?Physical Exam:

## 2021-07-10 NOTE — Patient Instructions (Signed)
Medication Instructions:  ? ?Your physician recommends that you continue on your current medications as directed. Please refer to the Current Medication list given to you today. ? ?*If you need a refill on your cardiac medications before your next appointment, please call your pharmacy* ? ? ?Follow-Up: ?At [facility], you and your health needs are our priority.  As part of our continuing mission to provide you with exceptional heart care, we have created designated Provider Care Teams.  These Care Teams include your primary Cardiologist (physician) and Advanced Practice Providers (APPs -  Physician Assistants and Nurse Practitioners) who all work together to provide you with the care you need, when you need it. ? ?We recommend signing up for the patient portal called "MyChart".  Sign up information is provided on this After Visit Summary.  MyChart is used to connect with patients for Virtual Visits (Telemedicine).  Patients are able to view lab/test results, encounter notes, upcoming appointments, etc.  Non-urgent messages can be sent to your provider as well.   ?To learn more about what you can do with MyChart, go to [email or web address].   ? ?Your next appointment:   ?6 month(s) ? ?The format for your next appointment:   ?In Person ? ?Provider:   ?Dr. Johney Frame --our scheduling team will be reaching out to you soon to arrange this appointment ? ?Important Information About Sugar ? ? ? ? ? ? ?

## 2021-07-11 DIAGNOSIS — E049 Nontoxic goiter, unspecified: Secondary | ICD-10-CM | POA: Diagnosis not present

## 2021-07-11 DIAGNOSIS — K449 Diaphragmatic hernia without obstruction or gangrene: Secondary | ICD-10-CM | POA: Diagnosis not present

## 2021-07-11 DIAGNOSIS — L89150 Pressure ulcer of sacral region, unstageable: Secondary | ICD-10-CM | POA: Diagnosis not present

## 2021-07-11 DIAGNOSIS — D126 Benign neoplasm of colon, unspecified: Secondary | ICD-10-CM | POA: Diagnosis not present

## 2021-07-11 DIAGNOSIS — L89612 Pressure ulcer of right heel, stage 2: Secondary | ICD-10-CM | POA: Diagnosis not present

## 2021-07-11 DIAGNOSIS — I10 Essential (primary) hypertension: Secondary | ICD-10-CM | POA: Diagnosis not present

## 2021-07-11 DIAGNOSIS — N182 Chronic kidney disease, stage 2 (mild): Secondary | ICD-10-CM | POA: Diagnosis not present

## 2021-07-11 DIAGNOSIS — E1122 Type 2 diabetes mellitus with diabetic chronic kidney disease: Secondary | ICD-10-CM | POA: Diagnosis not present

## 2021-07-11 DIAGNOSIS — H409 Unspecified glaucoma: Secondary | ICD-10-CM | POA: Diagnosis not present

## 2021-07-11 DIAGNOSIS — L89620 Pressure ulcer of left heel, unstageable: Secondary | ICD-10-CM | POA: Diagnosis not present

## 2021-07-11 DIAGNOSIS — K2270 Barrett's esophagus without dysplasia: Secondary | ICD-10-CM | POA: Diagnosis not present

## 2021-07-11 DIAGNOSIS — Z7984 Long term (current) use of oral hypoglycemic drugs: Secondary | ICD-10-CM | POA: Diagnosis not present

## 2021-07-11 DIAGNOSIS — N6019 Diffuse cystic mastopathy of unspecified breast: Secondary | ICD-10-CM | POA: Diagnosis not present

## 2021-07-11 DIAGNOSIS — Z87891 Personal history of nicotine dependence: Secondary | ICD-10-CM | POA: Diagnosis not present

## 2021-07-11 DIAGNOSIS — Z4800 Encounter for change or removal of nonsurgical wound dressing: Secondary | ICD-10-CM | POA: Diagnosis not present

## 2021-07-11 DIAGNOSIS — K5790 Diverticulosis of intestine, part unspecified, without perforation or abscess without bleeding: Secondary | ICD-10-CM | POA: Diagnosis not present

## 2021-07-11 DIAGNOSIS — Z993 Dependence on wheelchair: Secondary | ICD-10-CM | POA: Diagnosis not present

## 2021-07-11 DIAGNOSIS — Z9181 History of falling: Secondary | ICD-10-CM | POA: Diagnosis not present

## 2021-07-11 DIAGNOSIS — E7800 Pure hypercholesterolemia, unspecified: Secondary | ICD-10-CM | POA: Diagnosis not present

## 2021-07-13 DIAGNOSIS — L89620 Pressure ulcer of left heel, unstageable: Secondary | ICD-10-CM | POA: Diagnosis not present

## 2021-07-13 DIAGNOSIS — N182 Chronic kidney disease, stage 2 (mild): Secondary | ICD-10-CM | POA: Diagnosis not present

## 2021-07-13 DIAGNOSIS — Z993 Dependence on wheelchair: Secondary | ICD-10-CM | POA: Diagnosis not present

## 2021-07-13 DIAGNOSIS — E1122 Type 2 diabetes mellitus with diabetic chronic kidney disease: Secondary | ICD-10-CM | POA: Diagnosis not present

## 2021-07-13 DIAGNOSIS — L89150 Pressure ulcer of sacral region, unstageable: Secondary | ICD-10-CM | POA: Diagnosis not present

## 2021-07-13 DIAGNOSIS — H409 Unspecified glaucoma: Secondary | ICD-10-CM | POA: Diagnosis not present

## 2021-07-13 DIAGNOSIS — E049 Nontoxic goiter, unspecified: Secondary | ICD-10-CM | POA: Diagnosis not present

## 2021-07-13 DIAGNOSIS — K5790 Diverticulosis of intestine, part unspecified, without perforation or abscess without bleeding: Secondary | ICD-10-CM | POA: Diagnosis not present

## 2021-07-13 DIAGNOSIS — L89612 Pressure ulcer of right heel, stage 2: Secondary | ICD-10-CM | POA: Diagnosis not present

## 2021-07-13 DIAGNOSIS — Z9181 History of falling: Secondary | ICD-10-CM | POA: Diagnosis not present

## 2021-07-13 DIAGNOSIS — Z4800 Encounter for change or removal of nonsurgical wound dressing: Secondary | ICD-10-CM | POA: Diagnosis not present

## 2021-07-13 DIAGNOSIS — Z7984 Long term (current) use of oral hypoglycemic drugs: Secondary | ICD-10-CM | POA: Diagnosis not present

## 2021-07-13 DIAGNOSIS — E7800 Pure hypercholesterolemia, unspecified: Secondary | ICD-10-CM | POA: Diagnosis not present

## 2021-07-13 DIAGNOSIS — K449 Diaphragmatic hernia without obstruction or gangrene: Secondary | ICD-10-CM | POA: Diagnosis not present

## 2021-07-13 DIAGNOSIS — I10 Essential (primary) hypertension: Secondary | ICD-10-CM | POA: Diagnosis not present

## 2021-07-13 DIAGNOSIS — N6019 Diffuse cystic mastopathy of unspecified breast: Secondary | ICD-10-CM | POA: Diagnosis not present

## 2021-07-13 DIAGNOSIS — D126 Benign neoplasm of colon, unspecified: Secondary | ICD-10-CM | POA: Diagnosis not present

## 2021-07-13 DIAGNOSIS — K2270 Barrett's esophagus without dysplasia: Secondary | ICD-10-CM | POA: Diagnosis not present

## 2021-07-13 DIAGNOSIS — Z87891 Personal history of nicotine dependence: Secondary | ICD-10-CM | POA: Diagnosis not present

## 2021-07-17 DIAGNOSIS — L89612 Pressure ulcer of right heel, stage 2: Secondary | ICD-10-CM | POA: Diagnosis not present

## 2021-07-17 DIAGNOSIS — N182 Chronic kidney disease, stage 2 (mild): Secondary | ICD-10-CM | POA: Diagnosis not present

## 2021-07-17 DIAGNOSIS — K2270 Barrett's esophagus without dysplasia: Secondary | ICD-10-CM | POA: Diagnosis not present

## 2021-07-17 DIAGNOSIS — Z993 Dependence on wheelchair: Secondary | ICD-10-CM | POA: Diagnosis not present

## 2021-07-17 DIAGNOSIS — H409 Unspecified glaucoma: Secondary | ICD-10-CM | POA: Diagnosis not present

## 2021-07-17 DIAGNOSIS — Z87891 Personal history of nicotine dependence: Secondary | ICD-10-CM | POA: Diagnosis not present

## 2021-07-17 DIAGNOSIS — L89150 Pressure ulcer of sacral region, unstageable: Secondary | ICD-10-CM | POA: Diagnosis not present

## 2021-07-17 DIAGNOSIS — Z4800 Encounter for change or removal of nonsurgical wound dressing: Secondary | ICD-10-CM | POA: Diagnosis not present

## 2021-07-17 DIAGNOSIS — D126 Benign neoplasm of colon, unspecified: Secondary | ICD-10-CM | POA: Diagnosis not present

## 2021-07-17 DIAGNOSIS — Z7984 Long term (current) use of oral hypoglycemic drugs: Secondary | ICD-10-CM | POA: Diagnosis not present

## 2021-07-17 DIAGNOSIS — I10 Essential (primary) hypertension: Secondary | ICD-10-CM | POA: Diagnosis not present

## 2021-07-17 DIAGNOSIS — E7800 Pure hypercholesterolemia, unspecified: Secondary | ICD-10-CM | POA: Diagnosis not present

## 2021-07-17 DIAGNOSIS — E049 Nontoxic goiter, unspecified: Secondary | ICD-10-CM | POA: Diagnosis not present

## 2021-07-17 DIAGNOSIS — N6019 Diffuse cystic mastopathy of unspecified breast: Secondary | ICD-10-CM | POA: Diagnosis not present

## 2021-07-17 DIAGNOSIS — L89620 Pressure ulcer of left heel, unstageable: Secondary | ICD-10-CM | POA: Diagnosis not present

## 2021-07-17 DIAGNOSIS — E1122 Type 2 diabetes mellitus with diabetic chronic kidney disease: Secondary | ICD-10-CM | POA: Diagnosis not present

## 2021-07-17 DIAGNOSIS — K449 Diaphragmatic hernia without obstruction or gangrene: Secondary | ICD-10-CM | POA: Diagnosis not present

## 2021-07-17 DIAGNOSIS — K5790 Diverticulosis of intestine, part unspecified, without perforation or abscess without bleeding: Secondary | ICD-10-CM | POA: Diagnosis not present

## 2021-07-17 DIAGNOSIS — Z9181 History of falling: Secondary | ICD-10-CM | POA: Diagnosis not present

## 2021-07-18 DIAGNOSIS — K2270 Barrett's esophagus without dysplasia: Secondary | ICD-10-CM | POA: Diagnosis not present

## 2021-07-18 DIAGNOSIS — Z993 Dependence on wheelchair: Secondary | ICD-10-CM | POA: Diagnosis not present

## 2021-07-18 DIAGNOSIS — D126 Benign neoplasm of colon, unspecified: Secondary | ICD-10-CM | POA: Diagnosis not present

## 2021-07-18 DIAGNOSIS — Z7984 Long term (current) use of oral hypoglycemic drugs: Secondary | ICD-10-CM | POA: Diagnosis not present

## 2021-07-18 DIAGNOSIS — K449 Diaphragmatic hernia without obstruction or gangrene: Secondary | ICD-10-CM | POA: Diagnosis not present

## 2021-07-18 DIAGNOSIS — Z87891 Personal history of nicotine dependence: Secondary | ICD-10-CM | POA: Diagnosis not present

## 2021-07-18 DIAGNOSIS — Z4800 Encounter for change or removal of nonsurgical wound dressing: Secondary | ICD-10-CM | POA: Diagnosis not present

## 2021-07-18 DIAGNOSIS — H409 Unspecified glaucoma: Secondary | ICD-10-CM | POA: Diagnosis not present

## 2021-07-18 DIAGNOSIS — N182 Chronic kidney disease, stage 2 (mild): Secondary | ICD-10-CM | POA: Diagnosis not present

## 2021-07-18 DIAGNOSIS — L89620 Pressure ulcer of left heel, unstageable: Secondary | ICD-10-CM | POA: Diagnosis not present

## 2021-07-18 DIAGNOSIS — K5790 Diverticulosis of intestine, part unspecified, without perforation or abscess without bleeding: Secondary | ICD-10-CM | POA: Diagnosis not present

## 2021-07-18 DIAGNOSIS — L89150 Pressure ulcer of sacral region, unstageable: Secondary | ICD-10-CM | POA: Diagnosis not present

## 2021-07-18 DIAGNOSIS — L89612 Pressure ulcer of right heel, stage 2: Secondary | ICD-10-CM | POA: Diagnosis not present

## 2021-07-18 DIAGNOSIS — E7800 Pure hypercholesterolemia, unspecified: Secondary | ICD-10-CM | POA: Diagnosis not present

## 2021-07-18 DIAGNOSIS — I10 Essential (primary) hypertension: Secondary | ICD-10-CM | POA: Diagnosis not present

## 2021-07-18 DIAGNOSIS — E049 Nontoxic goiter, unspecified: Secondary | ICD-10-CM | POA: Diagnosis not present

## 2021-07-18 DIAGNOSIS — E1122 Type 2 diabetes mellitus with diabetic chronic kidney disease: Secondary | ICD-10-CM | POA: Diagnosis not present

## 2021-07-18 DIAGNOSIS — Z9181 History of falling: Secondary | ICD-10-CM | POA: Diagnosis not present

## 2021-07-18 DIAGNOSIS — N6019 Diffuse cystic mastopathy of unspecified breast: Secondary | ICD-10-CM | POA: Diagnosis not present

## 2021-07-23 ENCOUNTER — Ambulatory Visit: Payer: Medicare Other | Admitting: Podiatry

## 2021-07-23 DIAGNOSIS — L89612 Pressure ulcer of right heel, stage 2: Secondary | ICD-10-CM | POA: Diagnosis not present

## 2021-07-23 DIAGNOSIS — H409 Unspecified glaucoma: Secondary | ICD-10-CM | POA: Diagnosis not present

## 2021-07-23 DIAGNOSIS — K5790 Diverticulosis of intestine, part unspecified, without perforation or abscess without bleeding: Secondary | ICD-10-CM | POA: Diagnosis not present

## 2021-07-23 DIAGNOSIS — L89620 Pressure ulcer of left heel, unstageable: Secondary | ICD-10-CM | POA: Diagnosis not present

## 2021-07-23 DIAGNOSIS — N182 Chronic kidney disease, stage 2 (mild): Secondary | ICD-10-CM | POA: Diagnosis not present

## 2021-07-23 DIAGNOSIS — Z993 Dependence on wheelchair: Secondary | ICD-10-CM | POA: Diagnosis not present

## 2021-07-23 DIAGNOSIS — E049 Nontoxic goiter, unspecified: Secondary | ICD-10-CM | POA: Diagnosis not present

## 2021-07-23 DIAGNOSIS — N6019 Diffuse cystic mastopathy of unspecified breast: Secondary | ICD-10-CM | POA: Diagnosis not present

## 2021-07-23 DIAGNOSIS — I10 Essential (primary) hypertension: Secondary | ICD-10-CM | POA: Diagnosis not present

## 2021-07-23 DIAGNOSIS — Z87891 Personal history of nicotine dependence: Secondary | ICD-10-CM | POA: Diagnosis not present

## 2021-07-23 DIAGNOSIS — K449 Diaphragmatic hernia without obstruction or gangrene: Secondary | ICD-10-CM | POA: Diagnosis not present

## 2021-07-23 DIAGNOSIS — E7800 Pure hypercholesterolemia, unspecified: Secondary | ICD-10-CM | POA: Diagnosis not present

## 2021-07-23 DIAGNOSIS — Z9181 History of falling: Secondary | ICD-10-CM | POA: Diagnosis not present

## 2021-07-23 DIAGNOSIS — Z7984 Long term (current) use of oral hypoglycemic drugs: Secondary | ICD-10-CM | POA: Diagnosis not present

## 2021-07-23 DIAGNOSIS — E1122 Type 2 diabetes mellitus with diabetic chronic kidney disease: Secondary | ICD-10-CM | POA: Diagnosis not present

## 2021-07-23 DIAGNOSIS — Z4800 Encounter for change or removal of nonsurgical wound dressing: Secondary | ICD-10-CM | POA: Diagnosis not present

## 2021-07-23 DIAGNOSIS — K2270 Barrett's esophagus without dysplasia: Secondary | ICD-10-CM | POA: Diagnosis not present

## 2021-07-23 DIAGNOSIS — D126 Benign neoplasm of colon, unspecified: Secondary | ICD-10-CM | POA: Diagnosis not present

## 2021-07-23 DIAGNOSIS — L89150 Pressure ulcer of sacral region, unstageable: Secondary | ICD-10-CM | POA: Diagnosis not present

## 2021-07-26 DIAGNOSIS — E7800 Pure hypercholesterolemia, unspecified: Secondary | ICD-10-CM | POA: Diagnosis not present

## 2021-07-26 DIAGNOSIS — Z4800 Encounter for change or removal of nonsurgical wound dressing: Secondary | ICD-10-CM | POA: Diagnosis not present

## 2021-07-26 DIAGNOSIS — E049 Nontoxic goiter, unspecified: Secondary | ICD-10-CM | POA: Diagnosis not present

## 2021-07-26 DIAGNOSIS — Z993 Dependence on wheelchair: Secondary | ICD-10-CM | POA: Diagnosis not present

## 2021-07-26 DIAGNOSIS — N6019 Diffuse cystic mastopathy of unspecified breast: Secondary | ICD-10-CM | POA: Diagnosis not present

## 2021-07-26 DIAGNOSIS — K5790 Diverticulosis of intestine, part unspecified, without perforation or abscess without bleeding: Secondary | ICD-10-CM | POA: Diagnosis not present

## 2021-07-26 DIAGNOSIS — L89620 Pressure ulcer of left heel, unstageable: Secondary | ICD-10-CM | POA: Diagnosis not present

## 2021-07-26 DIAGNOSIS — K2270 Barrett's esophagus without dysplasia: Secondary | ICD-10-CM | POA: Diagnosis not present

## 2021-07-26 DIAGNOSIS — K449 Diaphragmatic hernia without obstruction or gangrene: Secondary | ICD-10-CM | POA: Diagnosis not present

## 2021-07-26 DIAGNOSIS — Z7984 Long term (current) use of oral hypoglycemic drugs: Secondary | ICD-10-CM | POA: Diagnosis not present

## 2021-07-26 DIAGNOSIS — L89612 Pressure ulcer of right heel, stage 2: Secondary | ICD-10-CM | POA: Diagnosis not present

## 2021-07-26 DIAGNOSIS — D126 Benign neoplasm of colon, unspecified: Secondary | ICD-10-CM | POA: Diagnosis not present

## 2021-07-26 DIAGNOSIS — E1122 Type 2 diabetes mellitus with diabetic chronic kidney disease: Secondary | ICD-10-CM | POA: Diagnosis not present

## 2021-07-26 DIAGNOSIS — N182 Chronic kidney disease, stage 2 (mild): Secondary | ICD-10-CM | POA: Diagnosis not present

## 2021-07-26 DIAGNOSIS — Z87891 Personal history of nicotine dependence: Secondary | ICD-10-CM | POA: Diagnosis not present

## 2021-07-26 DIAGNOSIS — I10 Essential (primary) hypertension: Secondary | ICD-10-CM | POA: Diagnosis not present

## 2021-07-26 DIAGNOSIS — Z9181 History of falling: Secondary | ICD-10-CM | POA: Diagnosis not present

## 2021-07-26 DIAGNOSIS — H409 Unspecified glaucoma: Secondary | ICD-10-CM | POA: Diagnosis not present

## 2021-07-26 DIAGNOSIS — L89150 Pressure ulcer of sacral region, unstageable: Secondary | ICD-10-CM | POA: Diagnosis not present

## 2021-07-27 DIAGNOSIS — K2270 Barrett's esophagus without dysplasia: Secondary | ICD-10-CM | POA: Diagnosis not present

## 2021-07-27 DIAGNOSIS — E1122 Type 2 diabetes mellitus with diabetic chronic kidney disease: Secondary | ICD-10-CM | POA: Diagnosis not present

## 2021-07-27 DIAGNOSIS — D126 Benign neoplasm of colon, unspecified: Secondary | ICD-10-CM | POA: Diagnosis not present

## 2021-07-27 DIAGNOSIS — E7800 Pure hypercholesterolemia, unspecified: Secondary | ICD-10-CM | POA: Diagnosis not present

## 2021-07-27 DIAGNOSIS — L89150 Pressure ulcer of sacral region, unstageable: Secondary | ICD-10-CM | POA: Diagnosis not present

## 2021-07-27 DIAGNOSIS — L89620 Pressure ulcer of left heel, unstageable: Secondary | ICD-10-CM | POA: Diagnosis not present

## 2021-07-27 DIAGNOSIS — N182 Chronic kidney disease, stage 2 (mild): Secondary | ICD-10-CM | POA: Diagnosis not present

## 2021-07-27 DIAGNOSIS — K449 Diaphragmatic hernia without obstruction or gangrene: Secondary | ICD-10-CM | POA: Diagnosis not present

## 2021-07-27 DIAGNOSIS — I10 Essential (primary) hypertension: Secondary | ICD-10-CM | POA: Diagnosis not present

## 2021-07-27 DIAGNOSIS — K5790 Diverticulosis of intestine, part unspecified, without perforation or abscess without bleeding: Secondary | ICD-10-CM | POA: Diagnosis not present

## 2021-07-27 DIAGNOSIS — L89612 Pressure ulcer of right heel, stage 2: Secondary | ICD-10-CM | POA: Diagnosis not present

## 2021-08-01 ENCOUNTER — Other Ambulatory Visit: Payer: Medicare Other | Admitting: Family Medicine

## 2021-08-01 ENCOUNTER — Encounter: Payer: Self-pay | Admitting: Family Medicine

## 2021-08-01 VITALS — BP 130/68 | HR 69 | Resp 18

## 2021-08-01 DIAGNOSIS — L89509 Pressure ulcer of unspecified ankle, unspecified stage: Secondary | ICD-10-CM | POA: Diagnosis not present

## 2021-08-01 NOTE — Progress Notes (Signed)
Therapist, nutritional Palliative Care Consult Note Telephone: [phone]  Fax: [phone]    Date of encounter: 08/01/21 9:11 PM PATIENT NAME: [name] [address] [place] [address]   [phone] (home)  DOB: 1931-09-29 MRN: [id number] PRIMARY CARE PROVIDER:    Maurice Small, MD,  [address] [place] [address] [phone]  REFERRING PROVIDER:   Maurice Small, MD [address] [place],  [place] [address] [phone]  RESPONSIBLE PARTY:    Contact Information     Name Relation Home Work Mobile   [place] F Daughter [phone]          I met face to face with patient and family in her home. Palliative Care was asked to follow this patient by consultation request of  Maurice Small, MD to address advance care planning and complex medical decision making. This is a follow up visit.                                   ASSESSMENT , SYMPTOM MANAGEMENT AND PLAN / RECOMMENDATIONS:   Pressure injury of skin Don't sit for longer than 2-3 hours at a time. Encourage protein intake. Elevate legs and use pressure reduction bandage from current breakdown. Continue daily wound care with soap and water, pat dry, apply vaseline to open area, cover with non-stick dressing.     Follow up Palliative Care Visit: Palliative care will continue to follow for complex medical decision making, advance care planning, and clarification of goals. Return 4 weeks or prn.   This visit was coded based on medical decision making (MDM).  PPS: 40%  HOSPICE ELIGIBILITY/DIAGNOSIS: TBD  Chief Complaint:  Civil engineer, contracting Palliative Care is following  patient for chronic disease management in setting of dementia with skin breakdown due to poor mobility.  HISTORY OF PRESENT ILLNESS:  [name] is a 88 y.o. year old female with dementia who is bed/chairbound with skin breakdown. She denies recent falls.  Appetite  is good and drinking well.  Denies pain, SOB, nausea, vomiting, dysuria.  She was recently having skilled nursing visits for wound care and has now been d/c'd from home care.  Her daughter is her primary caregiver and has had a stroke herself.  She has grandchildren present in the home who help with her care.  Daughter states that they have been keeping the protective boots on and elevating off the bed or wheelchair.  History obtained from review of EMR, discussion with family and/or [name].  I reviewed available labs, medications, imaging, studies and related documents from the EMR.  Records reviewed and summarized above.   ROS General: NAD EYES: denies vision changes ENMT: denies dysphagia Cardiovascular: denies chest pain, denies DOE Pulmonary: denies cough, denies increased SOB Abdomen: endorses good appetite, denies constipation, endorses continence of bowel GU: denies dysuria, endorses incontinence of urine MSK:  denies increased weakness, no falls reported Skin: denies rashes, has small area of erythema over sacrum that is intact Neurological: denies pain, denies insomnia Psych: Endorses positive mood Heme/lymph/immuno: denies bruises, abnormal bleeding  Physical Exam: Current and past weights: 170 lbs as of 07/09/21, on 05/28/21 weight recorded as 196 lbs Constitutional: NAD General: frail appearing, obese  EYES: anicteric sclera, lids intact, no discharge  ENMT: intact hearing, oral mucous membranes moist, dentition intact CV: S1S2, RRR, no LE edema Pulmonary: CTAB, no increased work of  breathing, no cough, room air Abdomen: normo-active BS + 4 quadrants, soft and non tender, no ascites GU: deferred MSK: no sarcopenia, moves all extremities, ambulatory Skin: warm and dry, has 1 x 3 cm area of skin breakdown over right heel with slight yellow slough, Inner left heel with slight serosanguinous drainage Neuro:  no generalized weakness, has some short term memory loss Psych:  non-anxious affect, A and O x 2 Hem/lymph/immuno: no widespread bruising   Thank you for the opportunity to participate in the care of [name].  The palliative care team will continue to follow. Please call our office at [phone] if we can be of additional assistance.   Marijo Conception, FNP -C  COVID-19 PATIENT SCREENING TOOL Asked and negative response unless otherwise noted:   Have you had symptoms of covid, tested positive or been in contact with someone with symptoms/positive test in the past 5-10 days?  no

## 2021-08-07 DIAGNOSIS — E7800 Pure hypercholesterolemia, unspecified: Secondary | ICD-10-CM | POA: Diagnosis not present

## 2021-08-07 DIAGNOSIS — K449 Diaphragmatic hernia without obstruction or gangrene: Secondary | ICD-10-CM | POA: Diagnosis not present

## 2021-08-07 DIAGNOSIS — H409 Unspecified glaucoma: Secondary | ICD-10-CM | POA: Diagnosis not present

## 2021-08-07 DIAGNOSIS — D126 Benign neoplasm of colon, unspecified: Secondary | ICD-10-CM | POA: Diagnosis not present

## 2021-08-07 DIAGNOSIS — L89612 Pressure ulcer of right heel, stage 2: Secondary | ICD-10-CM | POA: Diagnosis not present

## 2021-08-07 DIAGNOSIS — I10 Essential (primary) hypertension: Secondary | ICD-10-CM | POA: Diagnosis not present

## 2021-08-07 DIAGNOSIS — Z993 Dependence on wheelchair: Secondary | ICD-10-CM | POA: Diagnosis not present

## 2021-08-07 DIAGNOSIS — Z7984 Long term (current) use of oral hypoglycemic drugs: Secondary | ICD-10-CM | POA: Diagnosis not present

## 2021-08-07 DIAGNOSIS — K5790 Diverticulosis of intestine, part unspecified, without perforation or abscess without bleeding: Secondary | ICD-10-CM | POA: Diagnosis not present

## 2021-08-07 DIAGNOSIS — N6019 Diffuse cystic mastopathy of unspecified breast: Secondary | ICD-10-CM | POA: Diagnosis not present

## 2021-08-07 DIAGNOSIS — N182 Chronic kidney disease, stage 2 (mild): Secondary | ICD-10-CM | POA: Diagnosis not present

## 2021-08-07 DIAGNOSIS — Z87891 Personal history of nicotine dependence: Secondary | ICD-10-CM | POA: Diagnosis not present

## 2021-08-07 DIAGNOSIS — L89150 Pressure ulcer of sacral region, unstageable: Secondary | ICD-10-CM | POA: Diagnosis not present

## 2021-08-07 DIAGNOSIS — Z9181 History of falling: Secondary | ICD-10-CM | POA: Diagnosis not present

## 2021-08-07 DIAGNOSIS — K2270 Barrett's esophagus without dysplasia: Secondary | ICD-10-CM | POA: Diagnosis not present

## 2021-08-07 DIAGNOSIS — E049 Nontoxic goiter, unspecified: Secondary | ICD-10-CM | POA: Diagnosis not present

## 2021-08-07 DIAGNOSIS — L89620 Pressure ulcer of left heel, unstageable: Secondary | ICD-10-CM | POA: Diagnosis not present

## 2021-08-07 DIAGNOSIS — E1122 Type 2 diabetes mellitus with diabetic chronic kidney disease: Secondary | ICD-10-CM | POA: Diagnosis not present

## 2021-08-07 DIAGNOSIS — Z4800 Encounter for change or removal of nonsurgical wound dressing: Secondary | ICD-10-CM | POA: Diagnosis not present

## 2021-08-08 DIAGNOSIS — L89303 Pressure ulcer of unspecified buttock, stage 3: Secondary | ICD-10-CM | POA: Diagnosis not present

## 2021-08-08 DIAGNOSIS — M1990 Unspecified osteoarthritis, unspecified site: Secondary | ICD-10-CM | POA: Diagnosis not present

## 2021-08-08 DIAGNOSIS — L89893 Pressure ulcer of other site, stage 3: Secondary | ICD-10-CM | POA: Diagnosis not present

## 2021-08-08 DIAGNOSIS — L89623 Pressure ulcer of left heel, stage 3: Secondary | ICD-10-CM | POA: Diagnosis not present

## 2021-08-08 DIAGNOSIS — R262 Difficulty in walking, not elsewhere classified: Secondary | ICD-10-CM | POA: Diagnosis not present

## 2021-08-08 DIAGNOSIS — L89312 Pressure ulcer of right buttock, stage 2: Secondary | ICD-10-CM | POA: Diagnosis not present

## 2021-08-10 DIAGNOSIS — M1990 Unspecified osteoarthritis, unspecified site: Secondary | ICD-10-CM | POA: Diagnosis not present

## 2021-08-14 DIAGNOSIS — E049 Nontoxic goiter, unspecified: Secondary | ICD-10-CM | POA: Diagnosis not present

## 2021-08-14 DIAGNOSIS — N182 Chronic kidney disease, stage 2 (mild): Secondary | ICD-10-CM | POA: Diagnosis not present

## 2021-08-14 DIAGNOSIS — D126 Benign neoplasm of colon, unspecified: Secondary | ICD-10-CM | POA: Diagnosis not present

## 2021-08-14 DIAGNOSIS — I10 Essential (primary) hypertension: Secondary | ICD-10-CM | POA: Diagnosis not present

## 2021-08-14 DIAGNOSIS — Z4800 Encounter for change or removal of nonsurgical wound dressing: Secondary | ICD-10-CM | POA: Diagnosis not present

## 2021-08-14 DIAGNOSIS — L89620 Pressure ulcer of left heel, unstageable: Secondary | ICD-10-CM | POA: Diagnosis not present

## 2021-08-14 DIAGNOSIS — Z9181 History of falling: Secondary | ICD-10-CM | POA: Diagnosis not present

## 2021-08-14 DIAGNOSIS — Z87891 Personal history of nicotine dependence: Secondary | ICD-10-CM | POA: Diagnosis not present

## 2021-08-14 DIAGNOSIS — H409 Unspecified glaucoma: Secondary | ICD-10-CM | POA: Diagnosis not present

## 2021-08-14 DIAGNOSIS — E7800 Pure hypercholesterolemia, unspecified: Secondary | ICD-10-CM | POA: Diagnosis not present

## 2021-08-14 DIAGNOSIS — N6019 Diffuse cystic mastopathy of unspecified breast: Secondary | ICD-10-CM | POA: Diagnosis not present

## 2021-08-14 DIAGNOSIS — L89612 Pressure ulcer of right heel, stage 2: Secondary | ICD-10-CM | POA: Diagnosis not present

## 2021-08-14 DIAGNOSIS — K5790 Diverticulosis of intestine, part unspecified, without perforation or abscess without bleeding: Secondary | ICD-10-CM | POA: Diagnosis not present

## 2021-08-14 DIAGNOSIS — L89150 Pressure ulcer of sacral region, unstageable: Secondary | ICD-10-CM | POA: Diagnosis not present

## 2021-08-14 DIAGNOSIS — Z993 Dependence on wheelchair: Secondary | ICD-10-CM | POA: Diagnosis not present

## 2021-08-14 DIAGNOSIS — Z7984 Long term (current) use of oral hypoglycemic drugs: Secondary | ICD-10-CM | POA: Diagnosis not present

## 2021-08-14 DIAGNOSIS — K2270 Barrett's esophagus without dysplasia: Secondary | ICD-10-CM | POA: Diagnosis not present

## 2021-08-14 DIAGNOSIS — E1122 Type 2 diabetes mellitus with diabetic chronic kidney disease: Secondary | ICD-10-CM | POA: Diagnosis not present

## 2021-08-14 DIAGNOSIS — K449 Diaphragmatic hernia without obstruction or gangrene: Secondary | ICD-10-CM | POA: Diagnosis not present

## 2021-08-20 DIAGNOSIS — I10 Essential (primary) hypertension: Secondary | ICD-10-CM | POA: Diagnosis not present

## 2021-08-20 DIAGNOSIS — N6019 Diffuse cystic mastopathy of unspecified breast: Secondary | ICD-10-CM | POA: Diagnosis not present

## 2021-08-20 DIAGNOSIS — Z4800 Encounter for change or removal of nonsurgical wound dressing: Secondary | ICD-10-CM | POA: Diagnosis not present

## 2021-08-20 DIAGNOSIS — E7800 Pure hypercholesterolemia, unspecified: Secondary | ICD-10-CM | POA: Diagnosis not present

## 2021-08-20 DIAGNOSIS — K2270 Barrett's esophagus without dysplasia: Secondary | ICD-10-CM | POA: Diagnosis not present

## 2021-08-20 DIAGNOSIS — Z87891 Personal history of nicotine dependence: Secondary | ICD-10-CM | POA: Diagnosis not present

## 2021-08-20 DIAGNOSIS — K5790 Diverticulosis of intestine, part unspecified, without perforation or abscess without bleeding: Secondary | ICD-10-CM | POA: Diagnosis not present

## 2021-08-20 DIAGNOSIS — N182 Chronic kidney disease, stage 2 (mild): Secondary | ICD-10-CM | POA: Diagnosis not present

## 2021-08-20 DIAGNOSIS — Z9181 History of falling: Secondary | ICD-10-CM | POA: Diagnosis not present

## 2021-08-20 DIAGNOSIS — E049 Nontoxic goiter, unspecified: Secondary | ICD-10-CM | POA: Diagnosis not present

## 2021-08-20 DIAGNOSIS — Z7984 Long term (current) use of oral hypoglycemic drugs: Secondary | ICD-10-CM | POA: Diagnosis not present

## 2021-08-20 DIAGNOSIS — L89150 Pressure ulcer of sacral region, unstageable: Secondary | ICD-10-CM | POA: Diagnosis not present

## 2021-08-20 DIAGNOSIS — H409 Unspecified glaucoma: Secondary | ICD-10-CM | POA: Diagnosis not present

## 2021-08-20 DIAGNOSIS — Z993 Dependence on wheelchair: Secondary | ICD-10-CM | POA: Diagnosis not present

## 2021-08-20 DIAGNOSIS — D126 Benign neoplasm of colon, unspecified: Secondary | ICD-10-CM | POA: Diagnosis not present

## 2021-08-20 DIAGNOSIS — L89612 Pressure ulcer of right heel, stage 2: Secondary | ICD-10-CM | POA: Diagnosis not present

## 2021-08-20 DIAGNOSIS — E1122 Type 2 diabetes mellitus with diabetic chronic kidney disease: Secondary | ICD-10-CM | POA: Diagnosis not present

## 2021-08-20 DIAGNOSIS — L89620 Pressure ulcer of left heel, unstageable: Secondary | ICD-10-CM | POA: Diagnosis not present

## 2021-08-20 DIAGNOSIS — K449 Diaphragmatic hernia without obstruction or gangrene: Secondary | ICD-10-CM | POA: Diagnosis not present

## 2021-08-28 DIAGNOSIS — K449 Diaphragmatic hernia without obstruction or gangrene: Secondary | ICD-10-CM | POA: Diagnosis not present

## 2021-08-28 DIAGNOSIS — E1122 Type 2 diabetes mellitus with diabetic chronic kidney disease: Secondary | ICD-10-CM | POA: Diagnosis not present

## 2021-08-28 DIAGNOSIS — L89612 Pressure ulcer of right heel, stage 2: Secondary | ICD-10-CM | POA: Diagnosis not present

## 2021-08-28 DIAGNOSIS — Z4800 Encounter for change or removal of nonsurgical wound dressing: Secondary | ICD-10-CM | POA: Diagnosis not present

## 2021-08-28 DIAGNOSIS — L89620 Pressure ulcer of left heel, unstageable: Secondary | ICD-10-CM | POA: Diagnosis not present

## 2021-08-28 DIAGNOSIS — E049 Nontoxic goiter, unspecified: Secondary | ICD-10-CM | POA: Diagnosis not present

## 2021-08-28 DIAGNOSIS — D126 Benign neoplasm of colon, unspecified: Secondary | ICD-10-CM | POA: Diagnosis not present

## 2021-08-28 DIAGNOSIS — I10 Essential (primary) hypertension: Secondary | ICD-10-CM | POA: Diagnosis not present

## 2021-08-28 DIAGNOSIS — Z87891 Personal history of nicotine dependence: Secondary | ICD-10-CM | POA: Diagnosis not present

## 2021-08-28 DIAGNOSIS — H409 Unspecified glaucoma: Secondary | ICD-10-CM | POA: Diagnosis not present

## 2021-08-28 DIAGNOSIS — K5790 Diverticulosis of intestine, part unspecified, without perforation or abscess without bleeding: Secondary | ICD-10-CM | POA: Diagnosis not present

## 2021-08-28 DIAGNOSIS — Z7984 Long term (current) use of oral hypoglycemic drugs: Secondary | ICD-10-CM | POA: Diagnosis not present

## 2021-08-28 DIAGNOSIS — K2270 Barrett's esophagus without dysplasia: Secondary | ICD-10-CM | POA: Diagnosis not present

## 2021-08-28 DIAGNOSIS — N182 Chronic kidney disease, stage 2 (mild): Secondary | ICD-10-CM | POA: Diagnosis not present

## 2021-08-28 DIAGNOSIS — L89150 Pressure ulcer of sacral region, unstageable: Secondary | ICD-10-CM | POA: Diagnosis not present

## 2021-08-28 DIAGNOSIS — Z9181 History of falling: Secondary | ICD-10-CM | POA: Diagnosis not present

## 2021-08-28 DIAGNOSIS — E7800 Pure hypercholesterolemia, unspecified: Secondary | ICD-10-CM | POA: Diagnosis not present

## 2021-08-28 DIAGNOSIS — Z993 Dependence on wheelchair: Secondary | ICD-10-CM | POA: Diagnosis not present

## 2021-08-28 DIAGNOSIS — N6019 Diffuse cystic mastopathy of unspecified breast: Secondary | ICD-10-CM | POA: Diagnosis not present

## 2021-08-29 ENCOUNTER — Encounter: Payer: Self-pay | Admitting: Family Medicine

## 2021-08-29 ENCOUNTER — Other Ambulatory Visit: Payer: Medicare Other | Admitting: Family Medicine

## 2021-08-29 VITALS — BP 108/56 | HR 74 | Resp 18

## 2021-08-29 DIAGNOSIS — L89509 Pressure ulcer of unspecified ankle, unspecified stage: Secondary | ICD-10-CM

## 2021-09-04 ENCOUNTER — Telehealth (INDEPENDENT_AMBULATORY_CARE_PROVIDER_SITE_OTHER): Payer: Medicare Other | Admitting: Physician Assistant

## 2021-09-04 DIAGNOSIS — F03B18 Unspecified dementia, moderate, with other behavioral disturbance: Secondary | ICD-10-CM

## 2021-09-04 NOTE — Progress Notes (Signed)
Virtual Visit via Video Note  The purpose of this virtual visit is to provide medical care in a patient that is unable to be seen in person due to physical or health limitations   Consent was obtained for video visit:  yes  Answered questions that patient had about telehealth interaction:  yes I discussed the limitations, risks, security and privacy concerns of performing an evaluation and management service by telemedicine. I also discussed with the patient that there may be a patient responsible charge related to this service. The patient expressed understanding and agreed to proceed.  Pt location: Home Physician Location: office Name of referring provider:  Kelton Pillar, MD I connected with [name] at patients initiation/request on 09/04/2021 at  1:00 PM EDT by video enabled telemedicine application and verified that I am speaking with the correct person using two identifiers. Pt MRN:  [id number] Pt DOB:  March 07, 1932 Video Participants:  [name];  daughter    Assessment and Plan:   '@PATIENT'$  [name] is a 88 y.o. RH female with a history of   seen today in follow up. This is an 88 yo ambidextrous right-hand dominant woman with a history of hypertension, hyperlipidemia, diabetes, breast cancer, with moderate dementia likely Alzheimer's disease with behavioral disturbance. MRI brain showed diffuse atrophy, mild to moderate chronic microvascular disease.  She is doing well on trazodone 25 mg nightly when she has bad nights.  Continue memantine 10 mg daily and Lexapro 20 mg daily.  Continue 24/7 care.  Follow-up in 6 months.  Continue Memantine '10mg'$  daily and Lexapro '20mg'$  daily. Continue 24/7 care. Follow-up in 6 months    History of Present Illness:  The patient had a virtual video visit on 09/04/21 . She last seen in the neurology clinic 03/08/2021 for moderate dementia with behavioral disturbance.  Her daughter [name] is present to provide information.  Since her last visit, [name]  reports that her mother's memory may be worse than prior.  "Changes are in the negative direction.  However, she is "less irritated about remembering ".  Her daughter had a stroke recently, and is worried about being able to provide 24/7 care for her, therefore they need home health nursing, which she is very helpful.  Patient is wheelchair-bound, 1 caregiver helps her to do some exercise.  PCP has been contacted for physical therapy.  Daughter prepares her meals, and is in charge of the finances and medications.  She has some hallucinations at night, reaching for things in the middle of the night.  The patient is incontinent of urine, and recently, she has strong urine odor, but the daughter gave her cranberry juice, and this has subsided.  There is no fever chills or night sweats reported.  No hallucinations or paranoia.  She is tolerating donepezil 10 mg daily.    History on Initial Assessment 02/24/2019: This is an 88 year old ambidextrous right-hand dominant woman with a history of hypertension, hyperlipidemia, diabetes, breast cancer, presenting for evaluation of memory loss. She states her memory is "pretty well."  She states she usually remembers to take her medications. Her daughter [name] is present during the e-visit and provides a different story. She has been living with her family since 25. Family started noticing memory changes at least 5 years ago, worse in the past year. She would repeat herself, lose things frequently. In the Fall of last year, she would not come home until very late at night, they were now sure if she knew  where she was. She started having difficulties managing finances. She had always been early paying her bills, however they found out her credit score went from 800 to 644 because she was forgetting to pay. [name] found out in March that bills were 2-3 months behind. For the past 2 years, she has had paranoia, accusing everybody of being a thief. She is convinced she saw  her grandson reach over her one night to take money, which never happened. She states this is true and she asked him what he was there for at 4am last week. She states her grandson is cursing every time he comes in. She has not showered in 2.5 years and has not washed her hair this year. She only takes sponge baths. Her shower stall is full of things, their house looks like a hoarder house. They found out that her license expired in January 2020 but she continued to drive, so they took away her keys. She has not driven since the end of February. She denies getting lost, however [name] reports she called at least once last Fall asking how to get home from their local [place]. She denies burning food on the stove, [name] reminds her she has not cooked in a few weeks because she had burned something. She is irritated every single day. She states she feels fine until people ask me thing that are "not that." She was started on Donepezil '10mg'$  daily by her PCP. Several paternal aunts had Alzheimer's disease. Another relative (maternal grandmother's first cousin) had early onset dementia. No history of significant head injuries or alcohol use.    She has left shoulder pain. She has occasional numbness in her feet that resolves when standing. She fell a month ago when she tripped and was on the floor all night. It appears she had incontinence while on the floor, she does not remember this. She denies any headaches, dizziness, diplopia, dysarthria/dysphagia, neck/back pain, focal numbness/tingling/weakness, bowel/bladder dysfunction, anosmia, or tremors.        Current Outpatient Medications on File Prior to Visit  Medication Sig Dispense Refill   Calcium Carbonate-Vitamin D (CALCIUM-D PO) Take 1 tablet by mouth daily.     escitalopram (LEXAPRO) 20 MG tablet Take 1 tablet (20 mg total) by mouth at bedtime. 90 tablet 3   Ferrous Sulfate (IRON PO) Take 65 mg by mouth.     memantine (NAMENDA) 10 MG tablet Take 1  tablet daily 90 tablet 3   metFORMIN (GLUCOPHAGE-XR) 500 MG 24 hr tablet Take 500 mg by mouth daily.     Multiple Vitamins-Minerals (MULTIVITAMIN WITH MINERALS) tablet Take 1 tablet by mouth daily.     simvastatin (ZOCOR) 40 MG tablet Take 40 mg by mouth daily.     TRAVATAN Z 0.004 % SOLN ophthalmic solution Place 1 drop into both eyes at bedtime.     traZODone (DESYREL) 50 MG tablet Take 1/2 tablet every night as needed for insomnia 15 tablet 5   No current facility-administered medications on file prior to visit.     Observations/Objective:   There were no vitals filed for this visit. GEN:  The patient appears stated age and is in NAD.  Neurological examination: Patient is awake, alert, oriented x 3. No aphasia or dysarthria. Intact fluency and comprehension. Remote and recent memory impaired. Able to name and repeat. Cranial nerves: Extraocular movements intact with no nystagmus. No facial asymmetry. Motor: moves all extremities symmetrically, at least anti-gravity x2. She is on a wheelchair. No incoordination on  finger to nose testing. Gait: not tested      Follow Up Instructions:    -I discussed the assessment and treatment plan with the patient. The patient was provided an opportunity to ask questions and all were answered. The patient agreed with the plan and demonstrated an understanding of the instructions.   The patient was advised to call back or seek an in-person evaluation if the symptoms worsen or if the condition fails to improve as anticipated.    Total time spent on today's visit was 30 minutes, including both face-to-face time and nonface-to-face time.  Time included that spent on review of records (prior notes available to me/labs/imaging if pertinent), discussing treatment and goals, answering patient's questions and coordinating care.   Sharene Butters, PA-C

## 2021-09-07 DIAGNOSIS — M1990 Unspecified osteoarthritis, unspecified site: Secondary | ICD-10-CM | POA: Diagnosis not present

## 2021-09-07 DIAGNOSIS — R262 Difficulty in walking, not elsewhere classified: Secondary | ICD-10-CM | POA: Diagnosis not present

## 2021-09-07 DIAGNOSIS — L89312 Pressure ulcer of right buttock, stage 2: Secondary | ICD-10-CM | POA: Diagnosis not present

## 2021-09-07 DIAGNOSIS — L89893 Pressure ulcer of other site, stage 3: Secondary | ICD-10-CM | POA: Diagnosis not present

## 2021-09-07 DIAGNOSIS — L89623 Pressure ulcer of left heel, stage 3: Secondary | ICD-10-CM | POA: Diagnosis not present

## 2021-09-07 DIAGNOSIS — L89303 Pressure ulcer of unspecified buttock, stage 3: Secondary | ICD-10-CM | POA: Diagnosis not present

## 2021-09-09 DIAGNOSIS — M1990 Unspecified osteoarthritis, unspecified site: Secondary | ICD-10-CM | POA: Diagnosis not present

## 2021-09-13 DIAGNOSIS — Z7984 Long term (current) use of oral hypoglycemic drugs: Secondary | ICD-10-CM | POA: Diagnosis not present

## 2021-09-13 DIAGNOSIS — L89612 Pressure ulcer of right heel, stage 2: Secondary | ICD-10-CM | POA: Diagnosis not present

## 2021-09-13 DIAGNOSIS — I10 Essential (primary) hypertension: Secondary | ICD-10-CM | POA: Diagnosis not present

## 2021-09-13 DIAGNOSIS — Z4800 Encounter for change or removal of nonsurgical wound dressing: Secondary | ICD-10-CM | POA: Diagnosis not present

## 2021-09-13 DIAGNOSIS — Z993 Dependence on wheelchair: Secondary | ICD-10-CM | POA: Diagnosis not present

## 2021-09-13 DIAGNOSIS — K2270 Barrett's esophagus without dysplasia: Secondary | ICD-10-CM | POA: Diagnosis not present

## 2021-09-13 DIAGNOSIS — N6019 Diffuse cystic mastopathy of unspecified breast: Secondary | ICD-10-CM | POA: Diagnosis not present

## 2021-09-13 DIAGNOSIS — E049 Nontoxic goiter, unspecified: Secondary | ICD-10-CM | POA: Diagnosis not present

## 2021-09-13 DIAGNOSIS — K449 Diaphragmatic hernia without obstruction or gangrene: Secondary | ICD-10-CM | POA: Diagnosis not present

## 2021-09-13 DIAGNOSIS — N182 Chronic kidney disease, stage 2 (mild): Secondary | ICD-10-CM | POA: Diagnosis not present

## 2021-09-13 DIAGNOSIS — D126 Benign neoplasm of colon, unspecified: Secondary | ICD-10-CM | POA: Diagnosis not present

## 2021-09-13 DIAGNOSIS — Z87891 Personal history of nicotine dependence: Secondary | ICD-10-CM | POA: Diagnosis not present

## 2021-09-13 DIAGNOSIS — K5790 Diverticulosis of intestine, part unspecified, without perforation or abscess without bleeding: Secondary | ICD-10-CM | POA: Diagnosis not present

## 2021-09-13 DIAGNOSIS — L89620 Pressure ulcer of left heel, unstageable: Secondary | ICD-10-CM | POA: Diagnosis not present

## 2021-09-13 DIAGNOSIS — L89150 Pressure ulcer of sacral region, unstageable: Secondary | ICD-10-CM | POA: Diagnosis not present

## 2021-09-13 DIAGNOSIS — H409 Unspecified glaucoma: Secondary | ICD-10-CM | POA: Diagnosis not present

## 2021-09-13 DIAGNOSIS — Z9181 History of falling: Secondary | ICD-10-CM | POA: Diagnosis not present

## 2021-09-13 DIAGNOSIS — E1122 Type 2 diabetes mellitus with diabetic chronic kidney disease: Secondary | ICD-10-CM | POA: Diagnosis not present

## 2021-09-13 DIAGNOSIS — E7800 Pure hypercholesterolemia, unspecified: Secondary | ICD-10-CM | POA: Diagnosis not present

## 2021-09-20 DIAGNOSIS — L89620 Pressure ulcer of left heel, unstageable: Secondary | ICD-10-CM | POA: Diagnosis not present

## 2021-09-20 DIAGNOSIS — K5790 Diverticulosis of intestine, part unspecified, without perforation or abscess without bleeding: Secondary | ICD-10-CM | POA: Diagnosis not present

## 2021-09-20 DIAGNOSIS — L89150 Pressure ulcer of sacral region, unstageable: Secondary | ICD-10-CM | POA: Diagnosis not present

## 2021-09-20 DIAGNOSIS — E1122 Type 2 diabetes mellitus with diabetic chronic kidney disease: Secondary | ICD-10-CM | POA: Diagnosis not present

## 2021-09-20 DIAGNOSIS — I10 Essential (primary) hypertension: Secondary | ICD-10-CM | POA: Diagnosis not present

## 2021-09-20 DIAGNOSIS — K2270 Barrett's esophagus without dysplasia: Secondary | ICD-10-CM | POA: Diagnosis not present

## 2021-09-20 DIAGNOSIS — H409 Unspecified glaucoma: Secondary | ICD-10-CM | POA: Diagnosis not present

## 2021-09-20 DIAGNOSIS — D126 Benign neoplasm of colon, unspecified: Secondary | ICD-10-CM | POA: Diagnosis not present

## 2021-09-20 DIAGNOSIS — L89612 Pressure ulcer of right heel, stage 2: Secondary | ICD-10-CM | POA: Diagnosis not present

## 2021-09-20 DIAGNOSIS — Z993 Dependence on wheelchair: Secondary | ICD-10-CM | POA: Diagnosis not present

## 2021-09-20 DIAGNOSIS — E049 Nontoxic goiter, unspecified: Secondary | ICD-10-CM | POA: Diagnosis not present

## 2021-09-20 DIAGNOSIS — Z4800 Encounter for change or removal of nonsurgical wound dressing: Secondary | ICD-10-CM | POA: Diagnosis not present

## 2021-09-20 DIAGNOSIS — K449 Diaphragmatic hernia without obstruction or gangrene: Secondary | ICD-10-CM | POA: Diagnosis not present

## 2021-09-20 DIAGNOSIS — N6019 Diffuse cystic mastopathy of unspecified breast: Secondary | ICD-10-CM | POA: Diagnosis not present

## 2021-09-20 DIAGNOSIS — Z7984 Long term (current) use of oral hypoglycemic drugs: Secondary | ICD-10-CM | POA: Diagnosis not present

## 2021-09-20 DIAGNOSIS — Z9181 History of falling: Secondary | ICD-10-CM | POA: Diagnosis not present

## 2021-09-20 DIAGNOSIS — E7800 Pure hypercholesterolemia, unspecified: Secondary | ICD-10-CM | POA: Diagnosis not present

## 2021-09-20 DIAGNOSIS — Z87891 Personal history of nicotine dependence: Secondary | ICD-10-CM | POA: Diagnosis not present

## 2021-09-20 DIAGNOSIS — N182 Chronic kidney disease, stage 2 (mild): Secondary | ICD-10-CM | POA: Diagnosis not present

## 2021-09-25 DIAGNOSIS — N6019 Diffuse cystic mastopathy of unspecified breast: Secondary | ICD-10-CM | POA: Diagnosis not present

## 2021-09-25 DIAGNOSIS — K449 Diaphragmatic hernia without obstruction or gangrene: Secondary | ICD-10-CM | POA: Diagnosis not present

## 2021-09-25 DIAGNOSIS — E1169 Type 2 diabetes mellitus with other specified complication: Secondary | ICD-10-CM | POA: Diagnosis not present

## 2021-09-25 DIAGNOSIS — Z993 Dependence on wheelchair: Secondary | ICD-10-CM | POA: Diagnosis not present

## 2021-09-25 DIAGNOSIS — L8990 Pressure ulcer of unspecified site, unspecified stage: Secondary | ICD-10-CM | POA: Diagnosis not present

## 2021-09-25 DIAGNOSIS — Z9181 History of falling: Secondary | ICD-10-CM | POA: Diagnosis not present

## 2021-09-25 DIAGNOSIS — E049 Nontoxic goiter, unspecified: Secondary | ICD-10-CM | POA: Diagnosis not present

## 2021-09-25 DIAGNOSIS — Z4800 Encounter for change or removal of nonsurgical wound dressing: Secondary | ICD-10-CM | POA: Diagnosis not present

## 2021-09-25 DIAGNOSIS — H409 Unspecified glaucoma: Secondary | ICD-10-CM | POA: Diagnosis not present

## 2021-09-25 DIAGNOSIS — I10 Essential (primary) hypertension: Secondary | ICD-10-CM | POA: Diagnosis not present

## 2021-09-25 DIAGNOSIS — E7800 Pure hypercholesterolemia, unspecified: Secondary | ICD-10-CM | POA: Diagnosis not present

## 2021-09-25 DIAGNOSIS — K5790 Diverticulosis of intestine, part unspecified, without perforation or abscess without bleeding: Secondary | ICD-10-CM | POA: Diagnosis not present

## 2021-09-25 DIAGNOSIS — N182 Chronic kidney disease, stage 2 (mild): Secondary | ICD-10-CM | POA: Diagnosis not present

## 2021-09-25 DIAGNOSIS — Z87891 Personal history of nicotine dependence: Secondary | ICD-10-CM | POA: Diagnosis not present

## 2021-09-25 DIAGNOSIS — L89620 Pressure ulcer of left heel, unstageable: Secondary | ICD-10-CM | POA: Diagnosis not present

## 2021-09-25 DIAGNOSIS — K2270 Barrett's esophagus without dysplasia: Secondary | ICD-10-CM | POA: Diagnosis not present

## 2021-09-25 DIAGNOSIS — D126 Benign neoplasm of colon, unspecified: Secondary | ICD-10-CM | POA: Diagnosis not present

## 2021-09-25 DIAGNOSIS — E1122 Type 2 diabetes mellitus with diabetic chronic kidney disease: Secondary | ICD-10-CM | POA: Diagnosis not present

## 2021-09-25 DIAGNOSIS — Z7984 Long term (current) use of oral hypoglycemic drugs: Secondary | ICD-10-CM | POA: Diagnosis not present

## 2021-10-04 DIAGNOSIS — I10 Essential (primary) hypertension: Secondary | ICD-10-CM | POA: Diagnosis not present

## 2021-10-04 DIAGNOSIS — Z9181 History of falling: Secondary | ICD-10-CM | POA: Diagnosis not present

## 2021-10-04 DIAGNOSIS — L89620 Pressure ulcer of left heel, unstageable: Secondary | ICD-10-CM | POA: Diagnosis not present

## 2021-10-04 DIAGNOSIS — N182 Chronic kidney disease, stage 2 (mild): Secondary | ICD-10-CM | POA: Diagnosis not present

## 2021-10-04 DIAGNOSIS — Z7984 Long term (current) use of oral hypoglycemic drugs: Secondary | ICD-10-CM | POA: Diagnosis not present

## 2021-10-04 DIAGNOSIS — D126 Benign neoplasm of colon, unspecified: Secondary | ICD-10-CM | POA: Diagnosis not present

## 2021-10-04 DIAGNOSIS — E049 Nontoxic goiter, unspecified: Secondary | ICD-10-CM | POA: Diagnosis not present

## 2021-10-04 DIAGNOSIS — Z4800 Encounter for change or removal of nonsurgical wound dressing: Secondary | ICD-10-CM | POA: Diagnosis not present

## 2021-10-04 DIAGNOSIS — Z993 Dependence on wheelchair: Secondary | ICD-10-CM | POA: Diagnosis not present

## 2021-10-04 DIAGNOSIS — K5790 Diverticulosis of intestine, part unspecified, without perforation or abscess without bleeding: Secondary | ICD-10-CM | POA: Diagnosis not present

## 2021-10-04 DIAGNOSIS — N6019 Diffuse cystic mastopathy of unspecified breast: Secondary | ICD-10-CM | POA: Diagnosis not present

## 2021-10-04 DIAGNOSIS — K2270 Barrett's esophagus without dysplasia: Secondary | ICD-10-CM | POA: Diagnosis not present

## 2021-10-04 DIAGNOSIS — Z87891 Personal history of nicotine dependence: Secondary | ICD-10-CM | POA: Diagnosis not present

## 2021-10-04 DIAGNOSIS — E1122 Type 2 diabetes mellitus with diabetic chronic kidney disease: Secondary | ICD-10-CM | POA: Diagnosis not present

## 2021-10-04 DIAGNOSIS — K449 Diaphragmatic hernia without obstruction or gangrene: Secondary | ICD-10-CM | POA: Diagnosis not present

## 2021-10-04 DIAGNOSIS — H409 Unspecified glaucoma: Secondary | ICD-10-CM | POA: Diagnosis not present

## 2021-10-04 DIAGNOSIS — E7800 Pure hypercholesterolemia, unspecified: Secondary | ICD-10-CM | POA: Diagnosis not present

## 2021-10-08 DIAGNOSIS — L89303 Pressure ulcer of unspecified buttock, stage 3: Secondary | ICD-10-CM | POA: Diagnosis not present

## 2021-10-08 DIAGNOSIS — L89312 Pressure ulcer of right buttock, stage 2: Secondary | ICD-10-CM | POA: Diagnosis not present

## 2021-10-08 DIAGNOSIS — L89623 Pressure ulcer of left heel, stage 3: Secondary | ICD-10-CM | POA: Diagnosis not present

## 2021-10-08 DIAGNOSIS — M1990 Unspecified osteoarthritis, unspecified site: Secondary | ICD-10-CM | POA: Diagnosis not present

## 2021-10-08 DIAGNOSIS — R262 Difficulty in walking, not elsewhere classified: Secondary | ICD-10-CM | POA: Diagnosis not present

## 2021-10-08 DIAGNOSIS — L89893 Pressure ulcer of other site, stage 3: Secondary | ICD-10-CM | POA: Diagnosis not present

## 2021-10-10 DIAGNOSIS — M1990 Unspecified osteoarthritis, unspecified site: Secondary | ICD-10-CM | POA: Diagnosis not present

## 2021-10-11 DIAGNOSIS — D126 Benign neoplasm of colon, unspecified: Secondary | ICD-10-CM | POA: Diagnosis not present

## 2021-10-11 DIAGNOSIS — E049 Nontoxic goiter, unspecified: Secondary | ICD-10-CM | POA: Diagnosis not present

## 2021-10-11 DIAGNOSIS — K2270 Barrett's esophagus without dysplasia: Secondary | ICD-10-CM | POA: Diagnosis not present

## 2021-10-11 DIAGNOSIS — N182 Chronic kidney disease, stage 2 (mild): Secondary | ICD-10-CM | POA: Diagnosis not present

## 2021-10-11 DIAGNOSIS — K449 Diaphragmatic hernia without obstruction or gangrene: Secondary | ICD-10-CM | POA: Diagnosis not present

## 2021-10-11 DIAGNOSIS — Z4800 Encounter for change or removal of nonsurgical wound dressing: Secondary | ICD-10-CM | POA: Diagnosis not present

## 2021-10-11 DIAGNOSIS — K5790 Diverticulosis of intestine, part unspecified, without perforation or abscess without bleeding: Secondary | ICD-10-CM | POA: Diagnosis not present

## 2021-10-11 DIAGNOSIS — E7800 Pure hypercholesterolemia, unspecified: Secondary | ICD-10-CM | POA: Diagnosis not present

## 2021-10-11 DIAGNOSIS — Z87891 Personal history of nicotine dependence: Secondary | ICD-10-CM | POA: Diagnosis not present

## 2021-10-11 DIAGNOSIS — Z993 Dependence on wheelchair: Secondary | ICD-10-CM | POA: Diagnosis not present

## 2021-10-11 DIAGNOSIS — N6019 Diffuse cystic mastopathy of unspecified breast: Secondary | ICD-10-CM | POA: Diagnosis not present

## 2021-10-11 DIAGNOSIS — I10 Essential (primary) hypertension: Secondary | ICD-10-CM | POA: Diagnosis not present

## 2021-10-11 DIAGNOSIS — Z7984 Long term (current) use of oral hypoglycemic drugs: Secondary | ICD-10-CM | POA: Diagnosis not present

## 2021-10-11 DIAGNOSIS — E1122 Type 2 diabetes mellitus with diabetic chronic kidney disease: Secondary | ICD-10-CM | POA: Diagnosis not present

## 2021-10-11 DIAGNOSIS — H409 Unspecified glaucoma: Secondary | ICD-10-CM | POA: Diagnosis not present

## 2021-10-11 DIAGNOSIS — Z9181 History of falling: Secondary | ICD-10-CM | POA: Diagnosis not present

## 2021-10-11 DIAGNOSIS — L89620 Pressure ulcer of left heel, unstageable: Secondary | ICD-10-CM | POA: Diagnosis not present

## 2021-10-17 DIAGNOSIS — K5900 Constipation, unspecified: Secondary | ICD-10-CM | POA: Diagnosis not present

## 2021-10-17 DIAGNOSIS — M6281 Muscle weakness (generalized): Secondary | ICD-10-CM | POA: Diagnosis not present

## 2021-10-17 DIAGNOSIS — I10 Essential (primary) hypertension: Secondary | ICD-10-CM | POA: Diagnosis not present

## 2021-10-17 DIAGNOSIS — Z1231 Encounter for screening mammogram for malignant neoplasm of breast: Secondary | ICD-10-CM | POA: Diagnosis not present

## 2021-10-17 DIAGNOSIS — E1329 Other specified diabetes mellitus with other diabetic kidney complication: Secondary | ICD-10-CM | POA: Diagnosis not present

## 2021-10-26 DIAGNOSIS — R262 Difficulty in walking, not elsewhere classified: Secondary | ICD-10-CM | POA: Diagnosis not present

## 2021-10-26 DIAGNOSIS — M6281 Muscle weakness (generalized): Secondary | ICD-10-CM | POA: Diagnosis not present

## 2021-10-27 DIAGNOSIS — Z7409 Other reduced mobility: Secondary | ICD-10-CM | POA: Diagnosis not present

## 2021-10-27 DIAGNOSIS — R262 Difficulty in walking, not elsewhere classified: Secondary | ICD-10-CM | POA: Diagnosis not present

## 2021-11-08 DIAGNOSIS — L89303 Pressure ulcer of unspecified buttock, stage 3: Secondary | ICD-10-CM | POA: Diagnosis not present

## 2021-11-08 DIAGNOSIS — L89893 Pressure ulcer of other site, stage 3: Secondary | ICD-10-CM | POA: Diagnosis not present

## 2021-11-08 DIAGNOSIS — L89312 Pressure ulcer of right buttock, stage 2: Secondary | ICD-10-CM | POA: Diagnosis not present

## 2021-11-08 DIAGNOSIS — L89623 Pressure ulcer of left heel, stage 3: Secondary | ICD-10-CM | POA: Diagnosis not present

## 2021-11-10 DIAGNOSIS — M1990 Unspecified osteoarthritis, unspecified site: Secondary | ICD-10-CM | POA: Diagnosis not present

## 2021-11-27 DIAGNOSIS — Z7409 Other reduced mobility: Secondary | ICD-10-CM | POA: Diagnosis not present

## 2021-11-27 DIAGNOSIS — R262 Difficulty in walking, not elsewhere classified: Secondary | ICD-10-CM | POA: Diagnosis not present

## 2021-11-28 DIAGNOSIS — I10 Essential (primary) hypertension: Secondary | ICD-10-CM | POA: Diagnosis not present

## 2021-11-28 DIAGNOSIS — K5900 Constipation, unspecified: Secondary | ICD-10-CM | POA: Diagnosis not present

## 2021-11-28 DIAGNOSIS — G4700 Insomnia, unspecified: Secondary | ICD-10-CM | POA: Diagnosis not present

## 2021-11-29 ENCOUNTER — Ambulatory Visit: Payer: Medicare Other | Admitting: Neurology

## 2021-12-08 DIAGNOSIS — L89893 Pressure ulcer of other site, stage 3: Secondary | ICD-10-CM | POA: Diagnosis not present

## 2021-12-08 DIAGNOSIS — L89623 Pressure ulcer of left heel, stage 3: Secondary | ICD-10-CM | POA: Diagnosis not present

## 2021-12-08 DIAGNOSIS — L89303 Pressure ulcer of unspecified buttock, stage 3: Secondary | ICD-10-CM | POA: Diagnosis not present

## 2021-12-08 DIAGNOSIS — L89312 Pressure ulcer of right buttock, stage 2: Secondary | ICD-10-CM | POA: Diagnosis not present

## 2021-12-10 DIAGNOSIS — M1990 Unspecified osteoarthritis, unspecified site: Secondary | ICD-10-CM | POA: Diagnosis not present

## 2021-12-10 DIAGNOSIS — R5381 Other malaise: Secondary | ICD-10-CM | POA: Diagnosis not present

## 2021-12-26 DIAGNOSIS — G4700 Insomnia, unspecified: Secondary | ICD-10-CM | POA: Diagnosis not present

## 2021-12-26 DIAGNOSIS — K5900 Constipation, unspecified: Secondary | ICD-10-CM | POA: Diagnosis not present

## 2021-12-26 DIAGNOSIS — R262 Difficulty in walking, not elsewhere classified: Secondary | ICD-10-CM | POA: Diagnosis not present

## 2021-12-26 DIAGNOSIS — I10 Essential (primary) hypertension: Secondary | ICD-10-CM | POA: Diagnosis not present

## 2021-12-27 DIAGNOSIS — R262 Difficulty in walking, not elsewhere classified: Secondary | ICD-10-CM | POA: Diagnosis not present

## 2021-12-27 DIAGNOSIS — Z7409 Other reduced mobility: Secondary | ICD-10-CM | POA: Diagnosis not present

## 2022-01-08 DIAGNOSIS — L89303 Pressure ulcer of unspecified buttock, stage 3: Secondary | ICD-10-CM | POA: Diagnosis not present

## 2022-01-08 DIAGNOSIS — L89893 Pressure ulcer of other site, stage 3: Secondary | ICD-10-CM | POA: Diagnosis not present

## 2022-01-08 DIAGNOSIS — L89623 Pressure ulcer of left heel, stage 3: Secondary | ICD-10-CM | POA: Diagnosis not present

## 2022-01-08 DIAGNOSIS — L89312 Pressure ulcer of right buttock, stage 2: Secondary | ICD-10-CM | POA: Diagnosis not present

## 2022-01-10 DIAGNOSIS — M1990 Unspecified osteoarthritis, unspecified site: Secondary | ICD-10-CM | POA: Diagnosis not present

## 2022-01-13 NOTE — Progress Notes (Unsigned)
Virtual Visit via Telephone Note   This visit type was conducted due to national recommendations for restrictions regarding the COVID-19 Pandemic (e.g. social distancing) in an effort to limit this patient's exposure and mitigate transmission in our community.  Due to her co-morbid illnesses, this patient is at least at moderate risk for complications without adequate follow up.  This format is felt to be most appropriate for this patient at this time.  The patient did not have access to video technology/had technical difficulties with video requiring transitioning to audio format only (telephone).  All issues noted in this document were discussed and addressed.  No physical exam could be performed with this format.  Please refer to the patient's chart for her  consent to telehealth for [facility].   The patient was identified using 2 identifiers.  Date:  01/15/2022   ID:  [name], DOB [birth year], MRN [id number]  Patient Location: Home Provider Location: Office/Clinic  PCP:  Kelton Pillar, MD  Cardiologist:  Fransico Him, MD  Electrophysiologist:  None   Evaluation Performed:  Follow-Up Visit  Chief Complaint:  Follow-up   History of Present Illness:    [name] is a 88 y.o. female with a hx of Barretts esophagus, breast cancer, CKD stage 2, DMII, dementia, HTN, HLD and pulmonary HTN who presents for virtual follow-up.  Was seen in clinic 02/2021 for SOB and LE edema. TTE 04/2021 showed LVEF 55-60%, normal RV, PASP 33.59mHg, trivial MR, mild AS.   Was seen as a telehealth visit 07/2021. Was overall stable. Was having nursing home visits. Mainly bed-bound at that point. Tolerating medications.  Per the patient's daughter, the patient is overall stable. She is having more memory loss at this time. She is wanting to perform physical therapy at this time to help with some mobility. Currently, she is able to go to the bed to the chair with hoyer lift or with full assistance.    Otherwise, no chest pain, orthopnea, LE edema, palpitations, lightheadedness or dizziness. Has been having a good appetite.  Has been having hard stools but has been started on stool softners and miralax.  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 unsure why this is noted in her hx.     Intraductal carcinoma in situ of right breast    Memory loss    Mild mood disorder (HCC)    Non-toxic goiter    Normal cardiac stress test 2000   done at [facility]  Obesity    Osteoarthritis    Personal history of malignant neoplasm of breast    Pulmonary hypertension ([place]    Thyroid nodule 2007   biopsy- benign   Trouble walking    UI (urinary incontinence)    Visual hallucinations    Vitamin D deficiency     Past  Surgical History:  Procedure Laterality Date   ADENOIDECTOMY     BREAST BIOPSY  1974   right   BREAST BIOPSY  1977   left   BREAST BIOPSY  2012   Low grade ductal carcinoma ( needle loc Dr Melanee Spry)   BREAST EXCISIONAL BIOPSY Right    BREAST LUMPECTOMY  01/22/2011   BREAST LUMPECTOMY  114/14/2012   Procedure: LUMPECTOMY;  Surgeon: Haywood Lasso, MD;  Location: [place];  Service: General;  Laterality: Left;  Needle localization  @ [facility]  12:30   BREAST LUMPECTOMY W/ NEEDLE LOCALIZATION  01/22/2011   Left - Dr Margot Chimes   COLONOSCOPY  2009   EYE SURGERY Left    laser for glaucoma   TONSILLECTOMY  1944   TOTAL KNEE ARTHROPLASTY Right 07/09/2015   Procedure: RIGHT TOTAL  KNEE ARTHROPLASTY;  Surgeon: Vickey Huger, MD;  Location: [place];  Service: Orthopedics;  Laterality: Right;   VAGINAL HYSTERECTOMY     partial    Current Medications: Current Meds  Medication Sig   Calcium Carbonate-Vitamin D (CALCIUM-D PO) Take 1 tablet by mouth daily.   Docusate Calcium (STOOL SOFTENER PO) Take 1 tablet by mouth 2 (two) times a week. Dulcolax   escitalopram (LEXAPRO) 20 MG tablet Take 1 tablet (20 mg total) by mouth at bedtime.   Ferrous Sulfate (IRON PO) Take 325 mg by mouth daily.   memantine (NAMENDA) 10 MG tablet Take 1 tablet daily   metFORMIN (GLUCOPHAGE-XR) 500 MG 24 hr tablet Take 500 mg by mouth daily.   Multiple Vitamins-Minerals (MULTIVITAMIN WITH MINERALS) tablet Take 1 tablet by mouth daily.   simvastatin (ZOCOR) 40 MG tablet Take 40 mg by mouth daily.   TRAVATAN Z 0.004 % SOLN ophthalmic solution Place 1 drop into both eyes at bedtime.     Allergies:   Peanut-containing drug products, Penicillins, Shellfish allergy, and Tape   Social History   Socioeconomic History   Marital status: Divorced    Spouse name: Not on file   Number of children: 1   Years of education: Not on file   Highest education level: Not on file  Occupational History   Occupation: college professor    Comment: Retired    Fish farm manager: RETIRED  Tobacco Use   Smoking status: Former    Types: Cigarettes    Quit date: 03/10/1981    Years since quitting: 40.8   Smokeless tobacco: Never  Vaping Use   Vaping Use: Never used  Substance and Sexual Activity   Alcohol use: No   Drug use: No   Sexual activity: Not Currently  Other Topics Concern   Not on file  Social History Narrative   Lives with daughter and two grandchildren      College education      Left handed            Social Determinants of Health   Financial Resource Strain: Not on file  Food Insecurity: Not on file  Transportation Needs: Unmet Transportation Needs (04/12/2021)   PRAPARE - Civil engineer, contracting (Medical): Yes    Lack of Transportation (Non-Medical): No  Physical Activity: Not on file  Stress: Not on file  Social Connections: Not on file     Family History: The patient's family history includes Breast cancer in her maternal grandmother; Cancer in her paternal aunt; Cancer (age of onset: 73) in her maternal grandmother; Cancer (age of onset: 39) in her paternal uncle.  ROS:   Please  see the history of present illness.    Review of Systems  Constitutional:  Negative for fever and weight loss.  Respiratory:  Positive for shortness of breath.   Cardiovascular:  Positive for leg swelling. Negative for chest pain, palpitations, orthopnea, claudication and PND.  Gastrointestinal:  Negative for nausea and vomiting.  Musculoskeletal:  Positive for falls and myalgias.  Neurological:  Positive for weakness. Negative for dizziness and loss of consciousness.  Psychiatric/Behavioral:  Positive for memory loss.      EKGs/Labs/Other Studies Reviewed:    The following studies were reviewed today:  TTE 04/2021: IMPRESSIONS   1. Left ventricular ejection fraction, by estimation, is 55 to 60%. The  left ventricle has normal function. Left ventricular endocardial border  not optimally defined to evaluate regional wall motion. Left ventricular  diastolic parameters are  indeterminate.   2. Right ventricular systolic function is normal. The right ventricular  size is normal. There is normal pulmonary artery systolic pressure. The  estimated right ventricular systolic pressure is 71.2 mmHg.   3. The mitral valve is grossly normal. Trivial mitral valve  regurgitation. No evidence of mitral stenosis.   4. The aortic valve is calcified. Aortic valve regurgitation is not  visualized. Mild aortic valve stenosis. Aortic valve mean gradient  measures 15.4 mmHg. Aortic valve Vmax measures 2.55 m/s.   5. The inferior vena cava is normal in size with greater than 50%  respiratory  variability, suggesting right atrial pressure of 3 mmHg.  EKG:  EKG is personally reviewed. No new tracing today  Recent Labs: 02/21/2021: BUN 16; Creatinine, Ser 0.81; Hemoglobin 9.9; NT-Pro BNP 306; Platelets 264; Potassium 4.0; Sodium 145; TSH 2.340   Recent Lipid Panel No results found for: "CHOL", "TRIG", "HDL", "CHOLHDL", "VLDL", "LDLCALC", "LDLDIRECT"   Risk Assessment/Calculations:           Physical Exam:    VS:  Ht '5\' 3"'$  (1.6 m)   Wt 200 lb (90.7 kg)   BMI 35.43 kg/m     Wt Readings from Last 3 Encounters:  01/15/22 200 lb (90.7 kg)  07/10/21 170 lb (77.1 kg)  03/08/21 170 lb (77.1 kg)     No exam performed today due to telehealth visit   ASSESSMENT:    1. SOB (shortness of breath)   2. Hypertension, unspecified type   3. Bilateral lower extremity edema   4. Mixed hyperlipidemia   5. Type 2 diabetes mellitus without complication, with long-term current use of insulin ([place])   6. Mild aortic stenosis      PLAN:    In order of problems listed above:  #SOB #LE Edema: Significantly improved. TTE reassuring with normal BiV function. Has mild AS but no significant MR. Essentially bedbound at this point. Will continue with conservative management.    #HLD: -Continue simva '40mg'$  daily -No further monitoring given advanced age and comorbidities   #DMII: A1C 6 on metformin. -Continue metformin   #Mild AS: Noted on TTE, mean gradient 15.54mHg, Vmax 2.570m. Given age and combordities, will manage conservatively.  #HTN: Controlled off medication. Monitored by home nursing.    #GOC: Essentially bedbound at this point. Will continue with conservative management. Okay for PT from a CV standpoint.      Time:   Today, I have spent 30 minutes with the patient with telehealth technology discussing the above problems.    Follow-up:  6 months   Medication Adjustments/Labs and Tests Ordered: Current medicines are reviewed at length with the patient  today.  Concerns regarding medicines are outlined above.   No orders of the defined types were placed in this encounter.  No orders of the defined types were placed in this encounter.  There are no Patient Instructions on file for this visit.    Signed, Freada Bergeron, MD  01/15/2022 2:33 PM    [place]

## 2022-01-15 ENCOUNTER — Encounter: Payer: Self-pay | Admitting: Cardiology

## 2022-01-15 ENCOUNTER — Ambulatory Visit: Payer: Medicare Other | Attending: Cardiology | Admitting: Cardiology

## 2022-01-15 VITALS — Ht 63.0 in | Wt 200.0 lb

## 2022-01-15 DIAGNOSIS — E782 Mixed hyperlipidemia: Secondary | ICD-10-CM

## 2022-01-15 DIAGNOSIS — I350 Nonrheumatic aortic (valve) stenosis: Secondary | ICD-10-CM

## 2022-01-15 DIAGNOSIS — Z794 Long term (current) use of insulin: Secondary | ICD-10-CM | POA: Diagnosis not present

## 2022-01-15 DIAGNOSIS — R600 Localized edema: Secondary | ICD-10-CM

## 2022-01-15 DIAGNOSIS — E1159 Type 2 diabetes mellitus with other circulatory complications: Secondary | ICD-10-CM

## 2022-01-15 DIAGNOSIS — R0602 Shortness of breath: Secondary | ICD-10-CM

## 2022-01-15 DIAGNOSIS — I10 Essential (primary) hypertension: Secondary | ICD-10-CM

## 2022-01-15 DIAGNOSIS — E119 Type 2 diabetes mellitus without complications: Secondary | ICD-10-CM

## 2022-01-15 NOTE — Patient Instructions (Signed)
Medication Instructions:   Your physician recommends that you continue on your current medications as directed. Please refer to the Current Medication list given to you today.  *If you need a refill on your cardiac medications before your next appointment, please call your pharmacy*   Follow-Up: At [facility], you and your health needs are our priority.  As part of our continuing mission to provide you with exceptional heart care, we have created designated Provider Care Teams.  These Care Teams include your primary Cardiologist (physician) and Advanced Practice Providers (APPs -  Physician Assistants and Nurse Practitioners) who all work together to provide you with the care you need, when you need it.  We recommend signing up for the patient portal called "MyChart".  Sign up information is provided on this After Visit Summary.  MyChart is used to connect with patients for Virtual Visits (Telemedicine).  Patients are able to view lab/test results, encounter notes, upcoming appointments, etc.  Non-urgent messages can be sent to your provider as well.   To learn more about what you can do with MyChart, go to [email or web address].    Your next appointment:   6 month(s)--ON Jul 21, 2022 AT 3 PM  The format for your next appointment:   Virtual Visit  ON Jul 21, 2022 AT 3 PM  Provider:   DR. Johney Frame   Important Information About Sugar

## 2022-01-20 DIAGNOSIS — N182 Chronic kidney disease, stage 2 (mild): Secondary | ICD-10-CM | POA: Diagnosis not present

## 2022-01-20 DIAGNOSIS — K219 Gastro-esophageal reflux disease without esophagitis: Secondary | ICD-10-CM | POA: Diagnosis not present

## 2022-01-20 DIAGNOSIS — E1169 Type 2 diabetes mellitus with other specified complication: Secondary | ICD-10-CM | POA: Diagnosis not present

## 2022-01-20 DIAGNOSIS — I270 Primary pulmonary hypertension: Secondary | ICD-10-CM | POA: Diagnosis not present

## 2022-01-20 DIAGNOSIS — E7800 Pure hypercholesterolemia, unspecified: Secondary | ICD-10-CM | POA: Diagnosis not present

## 2022-01-27 DIAGNOSIS — Z7409 Other reduced mobility: Secondary | ICD-10-CM | POA: Diagnosis not present

## 2022-01-27 DIAGNOSIS — R262 Difficulty in walking, not elsewhere classified: Secondary | ICD-10-CM | POA: Diagnosis not present

## 2022-02-06 DIAGNOSIS — K5900 Constipation, unspecified: Secondary | ICD-10-CM | POA: Diagnosis not present

## 2022-02-06 DIAGNOSIS — B370 Candidal stomatitis: Secondary | ICD-10-CM | POA: Diagnosis not present

## 2022-02-06 DIAGNOSIS — R262 Difficulty in walking, not elsewhere classified: Secondary | ICD-10-CM | POA: Diagnosis not present

## 2022-02-07 DIAGNOSIS — L89312 Pressure ulcer of right buttock, stage 2: Secondary | ICD-10-CM | POA: Diagnosis not present

## 2022-02-07 DIAGNOSIS — L89893 Pressure ulcer of other site, stage 3: Secondary | ICD-10-CM | POA: Diagnosis not present

## 2022-02-07 DIAGNOSIS — L89303 Pressure ulcer of unspecified buttock, stage 3: Secondary | ICD-10-CM | POA: Diagnosis not present

## 2022-02-07 DIAGNOSIS — L89623 Pressure ulcer of left heel, stage 3: Secondary | ICD-10-CM | POA: Diagnosis not present

## 2022-02-26 DIAGNOSIS — R262 Difficulty in walking, not elsewhere classified: Secondary | ICD-10-CM | POA: Diagnosis not present

## 2022-02-26 DIAGNOSIS — Z7409 Other reduced mobility: Secondary | ICD-10-CM | POA: Diagnosis not present

## 2022-03-04 ENCOUNTER — Encounter: Payer: Self-pay | Admitting: Neurology

## 2022-03-04 ENCOUNTER — Telehealth (INDEPENDENT_AMBULATORY_CARE_PROVIDER_SITE_OTHER): Payer: Medicare Other | Admitting: Neurology

## 2022-03-04 VITALS — Ht 63.5 in | Wt 160.0 lb

## 2022-03-04 DIAGNOSIS — F03B18 Unspecified dementia, moderate, with other behavioral disturbance: Secondary | ICD-10-CM | POA: Diagnosis not present

## 2022-03-04 MED ORDER — ESCITALOPRAM OXALATE 20 MG PO TABS: 20.0000 mg | ORAL_TABLET | Freq: Every day | ORAL | 3 refills | Status: DC

## 2022-03-04 MED ORDER — MEMANTINE HCL 10 MG PO TABS: ORAL_TABLET | ORAL | 3 refills | Status: DC

## 2022-03-04 NOTE — Progress Notes (Signed)
Virtual Visit via Video Note The purpose of this virtual visit is to provide medical care while limiting exposure to the novel coronavirus.    Consent was obtained for video visit:  Yes.   Answered questions that patient had about telehealth interaction:  Yes.   I discussed the limitations, risks, security and privacy concerns of performing an evaluation and management service by telemedicine. I also discussed with the patient that there may be a patient responsible charge related to this service. The patient expressed understanding and agreed to proceed.  Pt location: Home Physician Location: office Name of referring provider:  Kelton Pillar, MD I connected with [name] at patients initiation/request on 03/04/2022 at  3:30 PM EST by video enabled telemedicine application and verified that I am speaking with the correct person using two identifiers. Pt MRN:  [id number] Pt DOB:  1931/06/04 Video Participants:  [name];  [name] (daughter)   History of Present Illness:  The patient had a virtual video visit on 03/04/2022. She was last seen in the neurology clinic 6 months ago by Memory Disorders PA Sharene Butters. Her daughter [name] provides the history, she does not follow instructions and has nonsensical speech at times. She says "I'm okay" when asked how she is doing, then says "I enjoy ice cream." Since her last visit, there has been continued physical decline. She cannot stand up or walk. [name] is wondering if she would benefit from physical therapy. Family manages medications, meals, finances. She wears adult diapers. She has excellent 24/7 care. She sleeps very well. There is a whole lot less anger, [name] denies any hallucinations or paranoia. She is on Memantine '10mg'$  daily (side effects on BID dose and Donepezil). She is no longer taking the Trazodone. Family has a [employer] fast streamer and wheelchair for transfers. She has a good appetite.   History on Initial Assessment 02/24/2019:  This is an 88 year old ambidextrous right-hand dominant woman with a history of hypertension, hyperlipidemia, diabetes, breast cancer, presenting for evaluation of memory loss. She states her memory is "pretty well."  She states she usually remembers to take her medications. Her daughter [name] is present during the e-visit and provides a different story. She has been living with her family since 23. Family started noticing memory changes at least 5 years ago, worse in the past year. She would repeat herself, lose things frequently. In the Fall of last year, she would not come home until very late at night, they were now sure if she knew where she was. She started having difficulties managing finances. She had always been early paying her bills, however they found out her credit score went from 800 to 644 because she was forgetting to pay. [name] found out in March that bills were 2-3 months behind. For the past 2 years, she has had paranoia, accusing everybody of being a thief. She is convinced she saw her grandson reach over her one night to take money, which never happened. She states this is true and she asked him what he was there for at 4am last week. She states her grandson is cursing every time he comes in. She has not showered in 2.5 years and has not washed her hair this year. She only takes sponge baths. Her shower stall is full of things, their house looks like a hoarder house. They found out that her license expired in January 2020 but she continued to drive, so they took away her keys. She has not  driven since the end of February. She denies getting lost, however [name] reports she called at least once last Fall asking how to get home from their local [place]. She denies burning food on the stove, [name] reminds her she has not cooked in a few weeks because she had burned something. She is irritated every single day. She states she feels fine until people ask me thing that are "not that." She was  started on Donepezil '10mg'$  daily by her PCP. Several paternal aunts had Alzheimer's disease. Another relative (maternal grandmother's first cousin) had early onset dementia. No history of significant head injuries or alcohol use.    She has left shoulder pain. She has occasional numbness in her feet that resolves when standing. She fell a month ago when she tripped and was on the floor all night. It appears she had incontinence while on the floor, she does not remember this. She denies any headaches, dizziness, diplopia, dysarthria/dysphagia, neck/back pain, focal numbness/tingling/weakness, bowel/bladder dysfunction, anosmia, or tremors.     Current Outpatient Medications on File Prior to Visit  Medication Sig Dispense Refill   Calcium Carbonate-Vitamin D (CALCIUM-D PO) Take 1 tablet by mouth daily.     Docusate Calcium (STOOL SOFTENER PO) Take 1 tablet by mouth 2 (two) times a week. Dulcolax     escitalopram (LEXAPRO) 20 MG tablet Take 1 tablet (20 mg total) by mouth at bedtime. 90 tablet 3   Ferrous Sulfate (IRON PO) Take 325 mg by mouth daily.     memantine (NAMENDA) 10 MG tablet Take 1 tablet daily 90 tablet 3   metFORMIN (GLUCOPHAGE-XR) 500 MG 24 hr tablet Take 500 mg by mouth daily.     Multiple Vitamins-Minerals (MULTIVITAMIN WITH MINERALS) tablet Take 1 tablet by mouth daily.     simvastatin (ZOCOR) 40 MG tablet Take 40 mg by mouth daily.     TRAVATAN Z 0.004 % SOLN ophthalmic solution Place 1 drop into both eyes at bedtime.     No current facility-administered medications on file prior to visit.     Observations/Objective:   Vitals:   03/04/22 1510  Weight: 160 lb (72.6 kg)  Height: 5' 3.5" (1.613 m)   GEN:  The patient appears stated age and is in NAD.  Neurological examination: Patient is awake but keeps eyes closed throughout most of the visit. She briefly opens her eyes and answers simple questions, then starts having nonsensical speech. Does not follow commands. No facial  asymmetry. She does not move any extremities when asked repeatedly, unable to assess motor function.   Assessment and Plan:   This is an 88 yo ambidextrous right-hand dominant woman with a history of hypertension, hyperlipidemia, diabetes, breast cancer, with moderate to severe dementia likely Alzheimer's disease with behavioral disturbance. MRI brain showed diffuse atrophy, mild to moderate chronic microvascular disease.  She has continued decline, unable to ambulate. Family is interested in physical therapy, discussed limitations with her dementia but they would like to try and discuss with PCP. Continue Memantine '10mg'$  daily and Lexapro '20mg'$  daily. Family provides excellent care with the help of a caregiver, continue 24/7 care. Follow-up in 1 year with Memory Disorders PA Sharene Butters, they know to call for any changes.    Follow Up Instructions:   -I discussed the assessment and treatment plan with the patient's daughter. The patient's daughter was provided an opportunity to ask questions and all were answered. The patient's daughter agreed with the plan and demonstrated an understanding of the instructions.  The patient's daughter was advised to call back or seek an in-person evaluation if the symptoms worsen or if the condition fails to improve as anticipated.     Cameron Sprang, MD

## 2022-03-04 NOTE — Patient Instructions (Signed)
Good to see you. Continue all medications. Continue 24/7 care. Follow-up in 1 year with Memory Disorders PA Sharene Butters. Call for any changes.

## 2022-03-06 DIAGNOSIS — H409 Unspecified glaucoma: Secondary | ICD-10-CM | POA: Diagnosis not present

## 2022-03-06 DIAGNOSIS — G4700 Insomnia, unspecified: Secondary | ICD-10-CM | POA: Diagnosis not present

## 2022-03-06 DIAGNOSIS — R262 Difficulty in walking, not elsewhere classified: Secondary | ICD-10-CM | POA: Diagnosis not present

## 2022-03-06 DIAGNOSIS — E1329 Other specified diabetes mellitus with other diabetic kidney complication: Secondary | ICD-10-CM | POA: Diagnosis not present

## 2022-03-06 DIAGNOSIS — I10 Essential (primary) hypertension: Secondary | ICD-10-CM | POA: Diagnosis not present

## 2022-05-12 DIAGNOSIS — E1169 Type 2 diabetes mellitus with other specified complication: Secondary | ICD-10-CM | POA: Diagnosis not present

## 2022-05-12 DIAGNOSIS — E7800 Pure hypercholesterolemia, unspecified: Secondary | ICD-10-CM | POA: Diagnosis not present

## 2022-05-14 DIAGNOSIS — K5900 Constipation, unspecified: Secondary | ICD-10-CM | POA: Diagnosis not present

## 2022-05-14 DIAGNOSIS — M1712 Unilateral primary osteoarthritis, left knee: Secondary | ICD-10-CM | POA: Diagnosis not present

## 2022-05-14 DIAGNOSIS — K5790 Diverticulosis of intestine, part unspecified, without perforation or abscess without bleeding: Secondary | ICD-10-CM | POA: Diagnosis not present

## 2022-05-14 DIAGNOSIS — N182 Chronic kidney disease, stage 2 (mild): Secondary | ICD-10-CM | POA: Diagnosis not present

## 2022-05-14 DIAGNOSIS — K219 Gastro-esophageal reflux disease without esophagitis: Secondary | ICD-10-CM | POA: Diagnosis not present

## 2022-05-14 DIAGNOSIS — N329 Bladder disorder, unspecified: Secondary | ICD-10-CM | POA: Diagnosis not present

## 2022-05-14 DIAGNOSIS — H409 Unspecified glaucoma: Secondary | ICD-10-CM | POA: Diagnosis not present

## 2022-05-14 DIAGNOSIS — G309 Alzheimer's disease, unspecified: Secondary | ICD-10-CM | POA: Diagnosis not present

## 2022-05-14 DIAGNOSIS — I130 Hypertensive heart and chronic kidney disease with heart failure and stage 1 through stage 4 chronic kidney disease, or unspecified chronic kidney disease: Secondary | ICD-10-CM | POA: Diagnosis not present

## 2022-05-14 DIAGNOSIS — E559 Vitamin D deficiency, unspecified: Secondary | ICD-10-CM | POA: Diagnosis not present

## 2022-05-14 DIAGNOSIS — I272 Pulmonary hypertension, unspecified: Secondary | ICD-10-CM | POA: Diagnosis not present

## 2022-05-14 DIAGNOSIS — E1122 Type 2 diabetes mellitus with diabetic chronic kidney disease: Secondary | ICD-10-CM | POA: Diagnosis not present

## 2022-05-14 DIAGNOSIS — G4700 Insomnia, unspecified: Secondary | ICD-10-CM | POA: Diagnosis not present

## 2022-05-14 DIAGNOSIS — K2270 Barrett's esophagus without dysplasia: Secondary | ICD-10-CM | POA: Diagnosis not present

## 2022-05-14 DIAGNOSIS — K449 Diaphragmatic hernia without obstruction or gangrene: Secondary | ICD-10-CM | POA: Diagnosis not present

## 2022-05-14 DIAGNOSIS — G959 Disease of spinal cord, unspecified: Secondary | ICD-10-CM | POA: Diagnosis not present

## 2022-05-14 DIAGNOSIS — F0282 Dementia in other diseases classified elsewhere, unspecified severity, with psychotic disturbance: Secondary | ICD-10-CM | POA: Diagnosis not present

## 2022-05-14 DIAGNOSIS — F02818 Dementia in other diseases classified elsewhere, unspecified severity, with other behavioral disturbance: Secondary | ICD-10-CM | POA: Diagnosis not present

## 2022-05-14 DIAGNOSIS — F0283 Dementia in other diseases classified elsewhere, unspecified severity, with mood disturbance: Secondary | ICD-10-CM | POA: Diagnosis not present

## 2022-05-14 DIAGNOSIS — E041 Nontoxic single thyroid nodule: Secondary | ICD-10-CM | POA: Diagnosis not present

## 2022-05-14 DIAGNOSIS — E7800 Pure hypercholesterolemia, unspecified: Secondary | ICD-10-CM | POA: Diagnosis not present

## 2022-05-14 DIAGNOSIS — I509 Heart failure, unspecified: Secondary | ICD-10-CM | POA: Diagnosis not present

## 2022-05-14 DIAGNOSIS — I2510 Atherosclerotic heart disease of native coronary artery without angina pectoris: Secondary | ICD-10-CM | POA: Diagnosis not present

## 2022-05-21 DIAGNOSIS — E7800 Pure hypercholesterolemia, unspecified: Secondary | ICD-10-CM | POA: Diagnosis not present

## 2022-05-21 DIAGNOSIS — N329 Bladder disorder, unspecified: Secondary | ICD-10-CM | POA: Diagnosis not present

## 2022-05-21 DIAGNOSIS — E1122 Type 2 diabetes mellitus with diabetic chronic kidney disease: Secondary | ICD-10-CM | POA: Diagnosis not present

## 2022-05-21 DIAGNOSIS — K5900 Constipation, unspecified: Secondary | ICD-10-CM | POA: Diagnosis not present

## 2022-05-21 DIAGNOSIS — H409 Unspecified glaucoma: Secondary | ICD-10-CM | POA: Diagnosis not present

## 2022-05-21 DIAGNOSIS — N182 Chronic kidney disease, stage 2 (mild): Secondary | ICD-10-CM | POA: Diagnosis not present

## 2022-05-21 DIAGNOSIS — F0283 Dementia in other diseases classified elsewhere, unspecified severity, with mood disturbance: Secondary | ICD-10-CM | POA: Diagnosis not present

## 2022-05-21 DIAGNOSIS — I130 Hypertensive heart and chronic kidney disease with heart failure and stage 1 through stage 4 chronic kidney disease, or unspecified chronic kidney disease: Secondary | ICD-10-CM | POA: Diagnosis not present

## 2022-05-21 DIAGNOSIS — F02818 Dementia in other diseases classified elsewhere, unspecified severity, with other behavioral disturbance: Secondary | ICD-10-CM | POA: Diagnosis not present

## 2022-05-21 DIAGNOSIS — M1712 Unilateral primary osteoarthritis, left knee: Secondary | ICD-10-CM | POA: Diagnosis not present

## 2022-05-21 DIAGNOSIS — E559 Vitamin D deficiency, unspecified: Secondary | ICD-10-CM | POA: Diagnosis not present

## 2022-05-21 DIAGNOSIS — K219 Gastro-esophageal reflux disease without esophagitis: Secondary | ICD-10-CM | POA: Diagnosis not present

## 2022-05-21 DIAGNOSIS — K2270 Barrett's esophagus without dysplasia: Secondary | ICD-10-CM | POA: Diagnosis not present

## 2022-05-21 DIAGNOSIS — E041 Nontoxic single thyroid nodule: Secondary | ICD-10-CM | POA: Diagnosis not present

## 2022-05-21 DIAGNOSIS — G4700 Insomnia, unspecified: Secondary | ICD-10-CM | POA: Diagnosis not present

## 2022-05-21 DIAGNOSIS — G309 Alzheimer's disease, unspecified: Secondary | ICD-10-CM | POA: Diagnosis not present

## 2022-05-21 DIAGNOSIS — I272 Pulmonary hypertension, unspecified: Secondary | ICD-10-CM | POA: Diagnosis not present

## 2022-05-21 DIAGNOSIS — I2510 Atherosclerotic heart disease of native coronary artery without angina pectoris: Secondary | ICD-10-CM | POA: Diagnosis not present

## 2022-05-21 DIAGNOSIS — K449 Diaphragmatic hernia without obstruction or gangrene: Secondary | ICD-10-CM | POA: Diagnosis not present

## 2022-05-21 DIAGNOSIS — K5790 Diverticulosis of intestine, part unspecified, without perforation or abscess without bleeding: Secondary | ICD-10-CM | POA: Diagnosis not present

## 2022-05-21 DIAGNOSIS — G959 Disease of spinal cord, unspecified: Secondary | ICD-10-CM | POA: Diagnosis not present

## 2022-05-21 DIAGNOSIS — F0282 Dementia in other diseases classified elsewhere, unspecified severity, with psychotic disturbance: Secondary | ICD-10-CM | POA: Diagnosis not present

## 2022-05-21 DIAGNOSIS — I509 Heart failure, unspecified: Secondary | ICD-10-CM | POA: Diagnosis not present

## 2022-05-27 DIAGNOSIS — G959 Disease of spinal cord, unspecified: Secondary | ICD-10-CM | POA: Diagnosis not present

## 2022-05-27 DIAGNOSIS — M1712 Unilateral primary osteoarthritis, left knee: Secondary | ICD-10-CM | POA: Diagnosis not present

## 2022-05-27 DIAGNOSIS — I2510 Atherosclerotic heart disease of native coronary artery without angina pectoris: Secondary | ICD-10-CM | POA: Diagnosis not present

## 2022-05-27 DIAGNOSIS — H409 Unspecified glaucoma: Secondary | ICD-10-CM | POA: Diagnosis not present

## 2022-05-27 DIAGNOSIS — K5790 Diverticulosis of intestine, part unspecified, without perforation or abscess without bleeding: Secondary | ICD-10-CM | POA: Diagnosis not present

## 2022-05-27 DIAGNOSIS — K5900 Constipation, unspecified: Secondary | ICD-10-CM | POA: Diagnosis not present

## 2022-05-27 DIAGNOSIS — I130 Hypertensive heart and chronic kidney disease with heart failure and stage 1 through stage 4 chronic kidney disease, or unspecified chronic kidney disease: Secondary | ICD-10-CM | POA: Diagnosis not present

## 2022-05-27 DIAGNOSIS — E7800 Pure hypercholesterolemia, unspecified: Secondary | ICD-10-CM | POA: Diagnosis not present

## 2022-05-27 DIAGNOSIS — K2270 Barrett's esophagus without dysplasia: Secondary | ICD-10-CM | POA: Diagnosis not present

## 2022-05-27 DIAGNOSIS — K449 Diaphragmatic hernia without obstruction or gangrene: Secondary | ICD-10-CM | POA: Diagnosis not present

## 2022-05-27 DIAGNOSIS — E041 Nontoxic single thyroid nodule: Secondary | ICD-10-CM | POA: Diagnosis not present

## 2022-05-27 DIAGNOSIS — E1122 Type 2 diabetes mellitus with diabetic chronic kidney disease: Secondary | ICD-10-CM | POA: Diagnosis not present

## 2022-05-27 DIAGNOSIS — F0283 Dementia in other diseases classified elsewhere, unspecified severity, with mood disturbance: Secondary | ICD-10-CM | POA: Diagnosis not present

## 2022-05-27 DIAGNOSIS — K219 Gastro-esophageal reflux disease without esophagitis: Secondary | ICD-10-CM | POA: Diagnosis not present

## 2022-05-27 DIAGNOSIS — G309 Alzheimer's disease, unspecified: Secondary | ICD-10-CM | POA: Diagnosis not present

## 2022-05-27 DIAGNOSIS — G4700 Insomnia, unspecified: Secondary | ICD-10-CM | POA: Diagnosis not present

## 2022-05-27 DIAGNOSIS — F0282 Dementia in other diseases classified elsewhere, unspecified severity, with psychotic disturbance: Secondary | ICD-10-CM | POA: Diagnosis not present

## 2022-05-27 DIAGNOSIS — F02818 Dementia in other diseases classified elsewhere, unspecified severity, with other behavioral disturbance: Secondary | ICD-10-CM | POA: Diagnosis not present

## 2022-05-27 DIAGNOSIS — N329 Bladder disorder, unspecified: Secondary | ICD-10-CM | POA: Diagnosis not present

## 2022-05-27 DIAGNOSIS — E559 Vitamin D deficiency, unspecified: Secondary | ICD-10-CM | POA: Diagnosis not present

## 2022-05-27 DIAGNOSIS — I509 Heart failure, unspecified: Secondary | ICD-10-CM | POA: Diagnosis not present

## 2022-05-27 DIAGNOSIS — N182 Chronic kidney disease, stage 2 (mild): Secondary | ICD-10-CM | POA: Diagnosis not present

## 2022-05-27 DIAGNOSIS — I272 Pulmonary hypertension, unspecified: Secondary | ICD-10-CM | POA: Diagnosis not present

## 2022-05-28 DIAGNOSIS — Z7409 Other reduced mobility: Secondary | ICD-10-CM | POA: Diagnosis not present

## 2022-05-28 DIAGNOSIS — R262 Difficulty in walking, not elsewhere classified: Secondary | ICD-10-CM | POA: Diagnosis not present

## 2022-06-04 DIAGNOSIS — G4700 Insomnia, unspecified: Secondary | ICD-10-CM | POA: Diagnosis not present

## 2022-06-04 DIAGNOSIS — I10 Essential (primary) hypertension: Secondary | ICD-10-CM | POA: Diagnosis not present

## 2022-06-04 DIAGNOSIS — E1329 Other specified diabetes mellitus with other diabetic kidney complication: Secondary | ICD-10-CM | POA: Diagnosis not present

## 2022-06-06 DIAGNOSIS — F0282 Dementia in other diseases classified elsewhere, unspecified severity, with psychotic disturbance: Secondary | ICD-10-CM | POA: Diagnosis not present

## 2022-06-06 DIAGNOSIS — G959 Disease of spinal cord, unspecified: Secondary | ICD-10-CM | POA: Diagnosis not present

## 2022-06-06 DIAGNOSIS — E1122 Type 2 diabetes mellitus with diabetic chronic kidney disease: Secondary | ICD-10-CM | POA: Diagnosis not present

## 2022-06-06 DIAGNOSIS — I272 Pulmonary hypertension, unspecified: Secondary | ICD-10-CM | POA: Diagnosis not present

## 2022-06-06 DIAGNOSIS — F0283 Dementia in other diseases classified elsewhere, unspecified severity, with mood disturbance: Secondary | ICD-10-CM | POA: Diagnosis not present

## 2022-06-06 DIAGNOSIS — K5900 Constipation, unspecified: Secondary | ICD-10-CM | POA: Diagnosis not present

## 2022-06-06 DIAGNOSIS — E7800 Pure hypercholesterolemia, unspecified: Secondary | ICD-10-CM | POA: Diagnosis not present

## 2022-06-06 DIAGNOSIS — K5790 Diverticulosis of intestine, part unspecified, without perforation or abscess without bleeding: Secondary | ICD-10-CM | POA: Diagnosis not present

## 2022-06-06 DIAGNOSIS — E559 Vitamin D deficiency, unspecified: Secondary | ICD-10-CM | POA: Diagnosis not present

## 2022-06-06 DIAGNOSIS — I130 Hypertensive heart and chronic kidney disease with heart failure and stage 1 through stage 4 chronic kidney disease, or unspecified chronic kidney disease: Secondary | ICD-10-CM | POA: Diagnosis not present

## 2022-06-06 DIAGNOSIS — I2510 Atherosclerotic heart disease of native coronary artery without angina pectoris: Secondary | ICD-10-CM | POA: Diagnosis not present

## 2022-06-06 DIAGNOSIS — K2270 Barrett's esophagus without dysplasia: Secondary | ICD-10-CM | POA: Diagnosis not present

## 2022-06-06 DIAGNOSIS — G4700 Insomnia, unspecified: Secondary | ICD-10-CM | POA: Diagnosis not present

## 2022-06-06 DIAGNOSIS — M1712 Unilateral primary osteoarthritis, left knee: Secondary | ICD-10-CM | POA: Diagnosis not present

## 2022-06-06 DIAGNOSIS — K449 Diaphragmatic hernia without obstruction or gangrene: Secondary | ICD-10-CM | POA: Diagnosis not present

## 2022-06-06 DIAGNOSIS — H409 Unspecified glaucoma: Secondary | ICD-10-CM | POA: Diagnosis not present

## 2022-06-06 DIAGNOSIS — N182 Chronic kidney disease, stage 2 (mild): Secondary | ICD-10-CM | POA: Diagnosis not present

## 2022-06-06 DIAGNOSIS — G309 Alzheimer's disease, unspecified: Secondary | ICD-10-CM | POA: Diagnosis not present

## 2022-06-06 DIAGNOSIS — I509 Heart failure, unspecified: Secondary | ICD-10-CM | POA: Diagnosis not present

## 2022-06-06 DIAGNOSIS — F02818 Dementia in other diseases classified elsewhere, unspecified severity, with other behavioral disturbance: Secondary | ICD-10-CM | POA: Diagnosis not present

## 2022-06-06 DIAGNOSIS — E041 Nontoxic single thyroid nodule: Secondary | ICD-10-CM | POA: Diagnosis not present

## 2022-06-06 DIAGNOSIS — K219 Gastro-esophageal reflux disease without esophagitis: Secondary | ICD-10-CM | POA: Diagnosis not present

## 2022-06-06 DIAGNOSIS — N329 Bladder disorder, unspecified: Secondary | ICD-10-CM | POA: Diagnosis not present

## 2022-06-28 DIAGNOSIS — R262 Difficulty in walking, not elsewhere classified: Secondary | ICD-10-CM | POA: Diagnosis not present

## 2022-06-28 DIAGNOSIS — Z7409 Other reduced mobility: Secondary | ICD-10-CM | POA: Diagnosis not present

## 2022-07-03 DIAGNOSIS — R262 Difficulty in walking, not elsewhere classified: Secondary | ICD-10-CM | POA: Diagnosis not present

## 2022-07-03 DIAGNOSIS — E1329 Other specified diabetes mellitus with other diabetic kidney complication: Secondary | ICD-10-CM | POA: Diagnosis not present

## 2022-07-03 DIAGNOSIS — K5900 Constipation, unspecified: Secondary | ICD-10-CM | POA: Diagnosis not present

## 2022-07-03 DIAGNOSIS — G4700 Insomnia, unspecified: Secondary | ICD-10-CM | POA: Diagnosis not present

## 2022-07-13 NOTE — Progress Notes (Addendum)
Virtual Visit via Telephone Note   This visit type was conducted due to national recommendations for restrictions regarding the COVID-19 Pandemic (e.g. social distancing) in an effort to limit this patient's exposure and mitigate transmission in our community.  Due to her co-morbid illnesses, this patient is at least at moderate risk for complications without adequate follow up.  This format is felt to be most appropriate for this patient at this time.  The patient did not have access to video technology/had technical difficulties with video requiring transitioning to audio format only (telephone).  All issues noted in this document were discussed and addressed.  No physical exam could be performed with this format.  Please refer to the patient's chart for her  consent to telehealth for [facility]   The patient was identified using 2 identifiers.  Date:  07/21/2022   ID:  [name], DOB [birth year], MRN [id number]  Patient Location: Home Provider Location: Office/Clinic  PCP:  Maurice Small, MD  Cardiologist:  Armanda Magic, MD  Electrophysiologist:  None   Evaluation Performed:  Follow-Up Visit  Chief Complaint:  Follow-up   History of Present Illness:    [name] is a 89 y.o. female with a hx of Barretts esophagus, breast cancer, CKD stage 2, DMII, dementia, HTN, HLD and pulmonary HTN who presents for virtual follow-up.  Was seen in clinic 02/2021 for SOB and LE edema. TTE 04/2021 showed LVEF 55-60%, normal RV, PASP 33.62mmHg, trivial MR, mild AS.   Was last seen as a telehealth visit 01/2022. Mainly bed-bound at that point. Tolerating medications. No chest pain or HF symptoms.  Today, the daughter states that the patient is overall stable. Has a new daytime caregiver who she enjoys. Continues to feel fatigued and remains mainly bedbound. No chest pain, LE edema, SOB, lightheadedness or syncope. No bleeding issues. Blood pressure has been stable.   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 unsure why this is noted in her hx.     Intraductal carcinoma in situ of right breast    Memory loss    Mild mood disorder (HCC)    Non-toxic goiter    Normal cardiac stress test 2000   done at [facility]   Obesity    Osteoarthritis    Personal history of malignant neoplasm of breast    Pulmonary hypertension (HCC)    Thyroid nodule 2007   biopsy- benign   Trouble walking    UI (urinary incontinence)    Visual hallucinations    Vitamin D deficiency     Past Surgical History:  Procedure Laterality Date   ADENOIDECTOMY     BREAST BIOPSY  1974   right   BREAST BIOPSY  1977   left   BREAST BIOPSY  2012   Low grade ductal carcinoma ( needle  loc Dr Si Gaul)   BREAST EXCISIONAL BIOPSY Right    BREAST LUMPECTOMY  01/22/2011   BREAST LUMPECTOMY  114/14/2012   Procedure: LUMPECTOMY;  Surgeon: [name], MD;  Location: [facility] OR;  Service: General;  Laterality: Left;  Needle localization  @ [facility]  12:30   BREAST LUMPECTOMY W/ NEEDLE LOCALIZATION  01/22/2011   Left - Dr Jamey Ripa   COLONOSCOPY  2009   EYE SURGERY Left    laser for glaucoma   TONSILLECTOMY  1944   TOTAL KNEE ARTHROPLASTY Right 07/09/2015   Procedure: RIGHT TOTAL KNEE ARTHROPLASTY;  Surgeon: Dannielle Huh, MD;  Location: [facility] OR;  Service: Orthopedics;  Laterality: Right;   VAGINAL HYSTERECTOMY     partial    Current Medications: No outpatient medications have been marked as taking for the  07/21/22 encounter (Video Visit) with Meriam Sprague, MD.     Allergies:   Peanut-containing drug products, Penicillins, Shellfish allergy, and Tape   Social History   Socioeconomic History   Marital status: Divorced    Spouse name: Not on file   Number of children: 1   Years of education: Not on file   Highest education level: Not on file  Occupational History   Occupation: college professor    Comment: Retired    Associate Professor: RETIRED  Tobacco Use   Smoking status: Former    Types: Cigarettes    Quit date: 03/10/1981    Years since quitting: 41.3   Smokeless tobacco: Never  Vaping Use   Vaping Use: Never used  Substance and Sexual Activity   Alcohol use: No   Drug use: No   Sexual activity: Not Currently  Other Topics Concern   Not on file  Social History Narrative   Lives with daughter and two grandchildren      College education      Left handed            Social Determinants of Health   Financial Resource Strain: Not on file  Food Insecurity: Not on file  Transportation Needs: Unmet Transportation Needs (04/12/2021)   PRAPARE - Administrator, Civil Service (Medical): Yes    Lack of Transportation (Non-Medical): No  Physical Activity: Not on file  Stress: Not on file  Social Connections: Not on file     Family History: The patient's family history includes Breast cancer in her maternal grandmother; Cancer in her paternal aunt; Cancer (age of onset: 59) in her maternal grandmother; Cancer (age of onset: 47) in her paternal uncle.  ROS:   As per HPI  EKGs/Labs/Other Studies Reviewed:    The following studies were reviewed today:  TTE 04/2021: IMPRESSIONS   1. Left ventricular ejection fraction, by estimation, is 55 to 60%. The  left ventricle has normal function. Left ventricular endocardial border  not optimally defined to evaluate regional wall motion. Left ventricular  diastolic parameters are  indeterminate.   2. Right ventricular  systolic function is normal. The right ventricular  size is normal. There is normal pulmonary artery systolic pressure. The  estimated right ventricular systolic pressure is 33.9 mmHg.   3. The mitral valve is grossly normal. Trivial mitral valve  regurgitation. No evidence of mitral stenosis.   4. The aortic valve is calcified. Aortic valve regurgitation is not  visualized. Mild aortic valve stenosis. Aortic valve mean gradient  measures 15.4 mmHg. Aortic valve Vmax measures 2.55 m/s.   5. The inferior vena cava is normal in size with greater than  50%  respiratory variability, suggesting right atrial pressure of 3 mmHg.  EKG:  EKG is personally reviewed. No new tracing today  Recent Labs: No results found for requested labs within last 365 days.   Recent Lipid Panel No results found for: "CHOL", "TRIG", "HDL", "CHOLHDL", "VLDL", "LDLCALC", "LDLDIRECT"   Risk Assessment/Calculations:           Physical Exam:    VS:  There were no vitals taken for this visit.    Wt Readings from Last 3 Encounters:  03/04/22 160 lb (72.6 kg)  01/15/22 200 lb (90.7 kg)  07/10/21 170 lb (77.1 kg)     No exam performed today due to telehealth visit   ASSESSMENT:    1. Hypertension, unspecified type   2. Mixed hyperlipidemia   3. Mild aortic stenosis   4. Type 2 diabetes mellitus without complication, with long-term current use of insulin (HCC)      PLAN:    In order of problems listed above:  #SOB #LE Edema: Significantly improved. TTE reassuring with normal BiV function. Has mild AS but no significant MR. Essentially bedbound at this point. Will continue with conservative management.    #HLD: -Continue simva 40mg  daily -No further monitoring given advanced age and comorbidities   #DMII: A1C 6 on metformin. -Continue metformin   #Mild AS: Noted on TTE, mean gradient 15.66mmHg, Vmax 2.9m/s. Given age and combordities, will manage conservatively.  #HTN: Controlled off  medication. Monitored by home nursing.    #GOC: Essentially bedbound at this point. Will continue with conservative management.   20 minutes spent talking to the patient and her daughter on the phone call reviewing symptoms, medications and plan.        Medication Adjustments/Labs and Tests Ordered: Current medicines are reviewed at length with the patient today.  Concerns regarding medicines are outlined above.   No orders of the defined types were placed in this encounter.  No orders of the defined types were placed in this encounter.  Patient Instructions  Medication Instructions:   Your physician recommends that you continue on your current medications as directed. Please refer to the Current Medication list given to you today.  *If you need a refill on your cardiac medications before your next appointment, please call your pharmacy*    Follow-Up:  6 MONTH TELEPHONE VISIT WITH DR. Shari Prows ON 01/27/23 AT 3 PM     Signed, Meriam Sprague, MD  07/21/2022 8:01 PM    [facility]

## 2022-07-21 ENCOUNTER — Ambulatory Visit: Payer: Medicare Other | Attending: Cardiology | Admitting: Cardiology

## 2022-07-21 DIAGNOSIS — I10 Essential (primary) hypertension: Secondary | ICD-10-CM

## 2022-07-21 DIAGNOSIS — I350 Nonrheumatic aortic (valve) stenosis: Secondary | ICD-10-CM

## 2022-07-21 DIAGNOSIS — E119 Type 2 diabetes mellitus without complications: Secondary | ICD-10-CM | POA: Diagnosis not present

## 2022-07-21 DIAGNOSIS — E782 Mixed hyperlipidemia: Secondary | ICD-10-CM

## 2022-07-21 DIAGNOSIS — Z794 Long term (current) use of insulin: Secondary | ICD-10-CM | POA: Diagnosis not present

## 2022-07-21 NOTE — Patient Instructions (Signed)
Medication Instructions:   Your physician recommends that you continue on your current medications as directed. Please refer to the Current Medication list given to you today.  *If you need a refill on your cardiac medications before your next appointment, please call your pharmacy*    Follow-Up:  6 MONTH TELEPHONE VISIT WITH DR. Shari Prows ON 01/27/23 AT 3 PM

## 2022-07-28 DIAGNOSIS — R262 Difficulty in walking, not elsewhere classified: Secondary | ICD-10-CM | POA: Diagnosis not present

## 2022-07-28 DIAGNOSIS — Z7409 Other reduced mobility: Secondary | ICD-10-CM | POA: Diagnosis not present

## 2022-08-16 DIAGNOSIS — G4700 Insomnia, unspecified: Secondary | ICD-10-CM | POA: Diagnosis not present

## 2022-08-16 DIAGNOSIS — T7849XD Other allergy, subsequent encounter: Secondary | ICD-10-CM | POA: Diagnosis not present

## 2022-08-16 DIAGNOSIS — I10 Essential (primary) hypertension: Secondary | ICD-10-CM | POA: Diagnosis not present

## 2022-09-13 DIAGNOSIS — G4700 Insomnia, unspecified: Secondary | ICD-10-CM | POA: Diagnosis not present

## 2022-09-13 DIAGNOSIS — G894 Chronic pain syndrome: Secondary | ICD-10-CM | POA: Diagnosis not present

## 2022-09-13 DIAGNOSIS — I10 Essential (primary) hypertension: Secondary | ICD-10-CM | POA: Diagnosis not present

## 2022-10-25 ENCOUNTER — Observation Stay (HOSPITAL_COMMUNITY): Payer: Medicare Other

## 2022-10-25 ENCOUNTER — Observation Stay (HOSPITAL_COMMUNITY)
Admission: EM | Admit: 2022-10-25 | Discharge: 2022-10-27 | Disposition: A | Discharge status: Home / Self Care | Payer: Medicare Other | Attending: Internal Medicine | Admitting: Internal Medicine

## 2022-10-25 ENCOUNTER — Emergency Department (HOSPITAL_COMMUNITY): Payer: Medicare Other

## 2022-10-25 ENCOUNTER — Other Ambulatory Visit: Payer: Self-pay

## 2022-10-25 ENCOUNTER — Encounter (HOSPITAL_COMMUNITY): Payer: Self-pay | Admitting: Emergency Medicine

## 2022-10-25 DIAGNOSIS — Z87891 Personal history of nicotine dependence: Secondary | ICD-10-CM | POA: Insufficient documentation

## 2022-10-25 DIAGNOSIS — U071 COVID-19: Secondary | ICD-10-CM | POA: Insufficient documentation

## 2022-10-25 DIAGNOSIS — R140 Abdominal distension (gaseous): Secondary | ICD-10-CM

## 2022-10-25 DIAGNOSIS — G9340 Encephalopathy, unspecified: Secondary | ICD-10-CM | POA: Diagnosis not present

## 2022-10-25 DIAGNOSIS — G9341 Metabolic encephalopathy: Principal | ICD-10-CM | POA: Insufficient documentation

## 2022-10-25 DIAGNOSIS — N182 Chronic kidney disease, stage 2 (mild): Secondary | ICD-10-CM | POA: Insufficient documentation

## 2022-10-25 DIAGNOSIS — F0390 Unspecified dementia without behavioral disturbance: Secondary | ICD-10-CM | POA: Diagnosis not present

## 2022-10-25 DIAGNOSIS — E119 Type 2 diabetes mellitus without complications: Secondary | ICD-10-CM

## 2022-10-25 DIAGNOSIS — Z96651 Presence of right artificial knee joint: Secondary | ICD-10-CM | POA: Insufficient documentation

## 2022-10-25 DIAGNOSIS — Z7984 Long term (current) use of oral hypoglycemic drugs: Secondary | ICD-10-CM | POA: Diagnosis not present

## 2022-10-25 DIAGNOSIS — E1122 Type 2 diabetes mellitus with diabetic chronic kidney disease: Secondary | ICD-10-CM | POA: Diagnosis not present

## 2022-10-25 DIAGNOSIS — Z79899 Other long term (current) drug therapy: Secondary | ICD-10-CM | POA: Insufficient documentation

## 2022-10-25 DIAGNOSIS — Z853 Personal history of malignant neoplasm of breast: Secondary | ICD-10-CM | POA: Insufficient documentation

## 2022-10-25 DIAGNOSIS — R4182 Altered mental status, unspecified: Secondary | ICD-10-CM | POA: Diagnosis present

## 2022-10-25 DIAGNOSIS — E785 Hyperlipidemia, unspecified: Secondary | ICD-10-CM

## 2022-10-25 DIAGNOSIS — I10 Essential (primary) hypertension: Secondary | ICD-10-CM

## 2022-10-25 DIAGNOSIS — I129 Hypertensive chronic kidney disease with stage 1 through stage 4 chronic kidney disease, or unspecified chronic kidney disease: Secondary | ICD-10-CM | POA: Insufficient documentation

## 2022-10-25 LAB — TSH: TSH: 2.95 u[IU]/mL (ref 0.350–4.500)

## 2022-10-25 LAB — CBC WITH DIFFERENTIAL/PLATELET
Abs Immature Granulocytes: 0.02 10*3/uL (ref 0.00–0.07)
Basophils Absolute: 0 10*3/uL (ref 0.0–0.1)
Basophils Relative: 1 %
Eosinophils Absolute: 0.2 10*3/uL (ref 0.0–0.5)
Eosinophils Relative: 3 %
HCT: 35.2 % — ABNORMAL LOW (ref 36.0–46.0)
Hemoglobin: 10.8 g/dL — ABNORMAL LOW (ref 12.0–15.0)
Immature Granulocytes: 0 %
Lymphocytes Relative: 22 %
Lymphs Abs: 1.8 10*3/uL (ref 0.7–4.0)
MCH: 26.9 pg (ref 26.0–34.0)
MCHC: 30.7 g/dL (ref 30.0–36.0)
MCV: 87.6 fL (ref 80.0–100.0)
Monocytes Absolute: 0.7 10*3/uL (ref 0.1–1.0)
Monocytes Relative: 8 %
Neutro Abs: 5.6 10*3/uL (ref 1.7–7.7)
Neutrophils Relative %: 66 %
Platelets: 379 10*3/uL (ref 150–400)
RBC: 4.02 MIL/uL (ref 3.87–5.11)
RDW: 16.2 % — ABNORMAL HIGH (ref 11.5–15.5)
WBC: 8.4 10*3/uL (ref 4.0–10.5)
nRBC: 0 % (ref 0.0–0.2)

## 2022-10-25 LAB — COMPREHENSIVE METABOLIC PANEL
ALT: 15 U/L (ref 0–44)
AST: 19 U/L (ref 15–41)
Albumin: 3.3 g/dL — ABNORMAL LOW (ref 3.5–5.0)
Alkaline Phosphatase: 66 U/L (ref 38–126)
Anion gap: 12 (ref 5–15)
BUN: 15 mg/dL (ref 8–23)
CO2: 23 mmol/L (ref 22–32)
Calcium: 9.7 mg/dL (ref 8.9–10.3)
Chloride: 102 mmol/L (ref 98–111)
Creatinine, Ser: 0.86 mg/dL (ref 0.44–1.00)
GFR, Estimated: >60 mL/min (ref >60)
Glucose, Bld: 107 mg/dL — ABNORMAL HIGH (ref 70–99)
Potassium: 4.8 mmol/L (ref 3.5–5.1)
Sodium: 137 mmol/L (ref 135–145)
Total Bilirubin: 0.5 mg/dL (ref 0.3–1.2)
Total Protein: 7.4 g/dL (ref 6.5–8.1)

## 2022-10-25 LAB — AMMONIA: Ammonia: 21 umol/L (ref 9–35)

## 2022-10-25 LAB — SARS CORONAVIRUS 2 BY RT PCR: SARS Coronavirus 2 by RT PCR: POSITIVE — AB

## 2022-10-25 LAB — CBG MONITORING, ED
Glucose-Capillary: 98 mg/dL (ref 70–99)
Glucose-Capillary: 114 mg/dL — ABNORMAL HIGH (ref 70–99)

## 2022-10-25 MED ORDER — INSULIN ASPART 100 UNIT/ML IJ SOLN
0.0000 [IU] | INTRAMUSCULAR | Status: DC
  Administered 2022-10-26: 2 [IU] via SUBCUTANEOUS
  Administered 2022-10-26 (×2): 1 [IU] via SUBCUTANEOUS

## 2022-10-25 MED ORDER — ACETAMINOPHEN 325 MG PO TABS
650.0000 mg | ORAL_TABLET | Freq: Four times a day (QID) | ORAL | Status: DC | PRN
  Administered 2022-10-26: 650 mg via ORAL
  Filled 2022-10-25: qty 2

## 2022-10-25 MED ORDER — ACETAMINOPHEN 650 MG RE SUPP: 650.0000 mg | Freq: Four times a day (QID) | RECTAL | Status: DC | PRN

## 2022-10-25 MED ORDER — ENOXAPARIN SODIUM 40 MG/0.4ML IJ SOSY: 40.0000 mg | PREFILLED_SYRINGE | INTRAMUSCULAR | Status: DC

## 2022-10-25 NOTE — ED Triage Notes (Signed)
Per [facility] pt coming from home where she lives with family. Patient has dementia and is bed bound at baseline. Daughter called out stating patient was not acting herself, not following commands or opening eyes. EMS report patient became responsive once moved into truck. Report shallow breathing with lear lung sounds. On doxycycline for cough. 94% on RA and placed on 2L [place]. State 5pm as LKN. EDP at bedside.

## 2022-10-25 NOTE — ED Provider Notes (Signed)
[place] EMERGENCY DEPARTMENT AT [facility] Provider Note  MDM   HPI/ROS:  [name] is a 89 y.o. female with multiple comorbidities presenting via EMS from facility out of concern for altered mental status.  She has dementia and is bedbound at baseline, but family felt that she was not acting herself.  Per EMS, patient became responsive once she was moved into the drug and was moving all 4 extremities.  She has been on doxycycline for a cough recently and is set to finish that prescription today.  Last known normal was 1700 this afternoon.  Family denies any additional infectious symptoms.  EMS placed patient on nasal cannula upon arrival, but claimed that sats were 94% on room air prior to initiation of supplemental oxygen.  Physical exam is notable for: - Well-appearing, no acute distress - ANO x 1, able to state name otherwise mentors incomprehensibly - Moving all 4 extremities - No obvious focal neurologic deficits - Lungs clear to auscultation bilaterally with no cardiopulmonary abnormality - No abdominal tenderness to palpation - No lower extremity edema  On my initial evaluation, patient is:  -Vital signs stable. Patient afebrile, hemodynamically stable, and non-toxic appearing. -Additional history obtained from EMS  Given the patient's history and physical exam, differential diagnosis includes but is not limited to CVA, seizure, toxic metabolic disturbance, infection, head bleed, etc.  Interpretations, interventions, and the patient's course of care are documented below.    Broad infectious metabolic workup initiated.  Overall unremarkable aside from COVID-positive.  Chest x-ray with no acute cardiopulmonary abnormality, head CT within normal limits.  Upon reassessment, mental status unchanged.  Patient still unable to answer questions or follow commands appropriately moving all 4 extremities, seemingly neurologically afocal.  Hospitalist medicine paged for admission  for encephalopathy secondary to COVID.  Please see inpatient provider notes for further details.  Disposition:  I discussed the case with hospitalist medicine who graciously agreed to admit the patient to their service for continued care.   Clinical Impression: No diagnosis found.  Rx / DC Orders ED Discharge Orders     None       The plan for this patient was discussed with Dr. Doran Durand, who voiced agreement and who oversaw evaluation and treatment of this patient.   Clinical Complexity A medically appropriate history, review of systems, and physical exam was performed.  My independent interpretations of EKG, labs, and radiology are documented in the ED course above.   Click here for ABCD2, HEART and other calculatorsREFRESH Note before signing   Patient's presentation is most consistent with acute presentation with potential threat to life or bodily function.  Medical Decision Making Amount and/or Complexity of Data Reviewed Labs: ordered. Radiology: ordered.  Risk Decision regarding hospitalization.    HPI/ROS      See MDM section for pertinent HPI and ROS. A complete ROS was performed with pertinent positives/negatives noted above.   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 unsure why this is noted in her hx.     Intraductal carcinoma in situ of right breast    Memory loss    Mild mood disorder (HCC)    Non-toxic goiter    Normal cardiac  stress test 2000   done at [facility]   Obesity    Osteoarthritis    Personal history of malignant neoplasm of breast    Pulmonary hypertension (HCC)    Thyroid nodule 2007   biopsy- benign   Trouble walking    UI (urinary incontinence)    Visual hallucinations    Vitamin D deficiency     Past Surgical History:  Procedure Laterality Date   ADENOIDECTOMY     BREAST BIOPSY  1974   right   BREAST BIOPSY  1977   left   BREAST BIOPSY  2012   Low grade ductal carcinoma ( needle loc Dr Si Gaul)   BREAST EXCISIONAL BIOPSY Right    BREAST LUMPECTOMY  01/22/2011   BREAST LUMPECTOMY  114/14/2012   Procedure: LUMPECTOMY;  Surgeon: [name], MD;  Location: [facility] OR;  Service: General;  Laterality: Left;  Needle localization  @ [facility]  12:30   BREAST LUMPECTOMY W/ NEEDLE LOCALIZATION  01/22/2011   Left - Dr Jamey Ripa   COLONOSCOPY  2009   EYE SURGERY Left    laser for glaucoma   TONSILLECTOMY  1944   TOTAL KNEE ARTHROPLASTY Right 07/09/2015   Procedure: RIGHT TOTAL KNEE ARTHROPLASTY;  Surgeon: Dannielle Huh, MD;  Location: [facility] OR;  Service: Orthopedics;  Laterality: Right;   VAGINAL HYSTERECTOMY     partial      Physical Exam   Vitals:   10/25/22 1831 10/25/22 1845 10/25/22 2315 10/25/22 2321  BP: (!) 145/92 (!) 126/58    Pulse: 81 71 70   Resp: 20 18 20    Temp: 98.4 F (36.9 C)   98.4 F (36.9 C)  TempSrc: Axillary   Oral  SpO2: 98% 98% 96%   Weight:      Height:        Physical Exam Vitals and nursing note reviewed.  Constitutional:      General: She is not in acute distress.    Appearance: She is well-developed.  HENT:     Head: Normocephalic and atraumatic.  Eyes:     Conjunctiva/sclera: Conjunctivae normal.  Cardiovascular:     Rate and Rhythm: Normal rate and regular rhythm.     Heart sounds: No murmur heard. Pulmonary:     Effort: Pulmonary effort is normal. No respiratory distress.     Breath sounds: Normal breath sounds.  Abdominal:     Palpations: Abdomen is soft.      Tenderness: There is no abdominal tenderness.  Musculoskeletal:        General: No swelling.     Cervical back: Neck supple.  Skin:    General: Skin is warm and dry.     Capillary Refill: Capillary refill takes less than 2 seconds.  Neurological:     General: No focal deficit present.     Mental Status: She is alert. She is disoriented.  Psychiatric:        Mood and Affect: Mood normal.     Starleen Arms, MD Department of Emergency Medicine   Please note that this documentation was produced with the assistance of voice-to-text technology and may contain  errors.    Dyanne Iha, MD 10/25/22 2351    Glyn Ade, MD 10/26/22 [phone]

## 2022-10-25 NOTE — H&P (Signed)
History and Physical    [name] NGE:[id number] DOB: 11/19/1931 DOA: 10/25/2022  PCP: System, Provider Not In  Patient coming from: Home  Chief Complaint: AMS  HPI: [name] is a 89 y.o. female with medical history significant of dementia and bedbound at baseline, type 2 diabetes, hypertension, hyperlipidemia, mild aortic stenosis, Barrett's esophagus, GERD, arthritis, history of breast cancer presenting with altered mental status.  Family reported patient not acting like herself, not following commands, or opening eyes.  EMS reported that patient became responsive once they moved her into the ambulance and was moving all 4 extremities.  Oxygen saturation 94% on room air and was placed on 2 L [place].  Patient is somnolent but easily arousable.  She is nonverbal and does not follow commands.  History provided by her daughter and granddaughter at bedside.  Family states patient has advanced dementia and is bedbound at baseline.  She is normally able to talk a little and recognizes family members but sometimes forgets.  Today she was doing okay until 4:30 or 5 PM when family noticed that patient became less responsive, staring, and her speech was slurred.  Her caregiver recently had a viral illness after which patient started having a cough.  She was started on doxycycline about 1.5 weeks ago.  She is otherwise eating, no nausea or vomiting.  She was previously constipated but now having daily bowel movements.  ED Course: Vital signs stable, satting 96-98% on room air.  Labs showing no leukocytosis, hemoglobin 10.8 (stable), glucose 107, UA pending, SARS-CoV-2 PCR positive, TSH normal, ammonia level normal.  CT head negative for acute intracranial abnormality.  Showing air-fluid levels in the paranasal sinuses suspicious for possible acute sinusitis and also showing left mastoid effusion.  Chest x-ray not suggestive of pneumonia.  Review of Systems:  Review of Systems  All other systems reviewed and  are negative.   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 unsure why this is noted in her hx.     Intraductal carcinoma in situ of right breast    Memory loss    Mild mood disorder (HCC)    Non-toxic goiter    Normal cardiac stress test 2000   done at [facility]   Obesity    Osteoarthritis    Personal history of malignant neoplasm of breast    Pulmonary hypertension (HCC)    Thyroid nodule 2007   biopsy- benign   Trouble walking    UI (urinary incontinence)    Visual hallucinations    Vitamin D deficiency     Past Surgical History:  Procedure Laterality Date   ADENOIDECTOMY     BREAST BIOPSY  1974   right   BREAST BIOPSY  1977   left   BREAST BIOPSY  2012   Low grade ductal carcinoma ( needle  loc Dr Si Gaul)   BREAST EXCISIONAL BIOPSY Right    BREAST LUMPECTOMY  01/22/2011   BREAST LUMPECTOMY  114/14/2012   Procedure: LUMPECTOMY;  Surgeon: [name], MD;  Location: [facility] OR;  Service: General;  Laterality: Left;  Needle localization  @ [facility]  12:30   BREAST LUMPECTOMY W/ NEEDLE LOCALIZATION  01/22/2011   Left - Dr Jamey Ripa   COLONOSCOPY  2009   EYE SURGERY Left    laser for glaucoma   TONSILLECTOMY  1944   TOTAL KNEE ARTHROPLASTY Right 07/09/2015   Procedure: RIGHT TOTAL KNEE ARTHROPLASTY;  Surgeon: Dannielle Huh, MD;  Location: [facility] OR;  Service: Orthopedics;  Laterality: Right;   VAGINAL HYSTERECTOMY     partial     reports that she quit smoking about 41 years  ago. Her smoking use included cigarettes. She has never used smokeless tobacco. She reports that she does not drink alcohol and does not use drugs.  Allergies  Allergen Reactions   Peanut-Containing Drug Products    Penicillins Hives   Shellfish Allergy Hives    Shrimp only   Tape Rash    PAPER TAPE ONLY    Family History  Problem Relation Age of Onset   Cancer Maternal Grandmother 1       breast   Breast cancer Maternal Grandmother    Cancer Paternal Aunt    Cancer Paternal Uncle 62       colon    Prior to Admission medications   Medication Sig Start Date End Date Taking? Authorizing Provider  Calcium Carbonate-Vitamin D (CALCIUM-D PO) Take 1 tablet by mouth daily.    [provider]  Docusate Calcium (STOOL SOFTENER PO) Take 1 tablet by mouth 2 (two) times a week. Dulcolax    [provider]  escitalopram (LEXAPRO) 20 MG tablet Take 1 tablet (20 mg total) by mouth at bedtime. 03/04/22   Van Clines, MD  Ferrous Sulfate (IRON PO) Take 325 mg by mouth daily.    [provider]  memantine (NAMENDA) 10 MG tablet Take 1 tablet daily 03/04/22   Van Clines, MD  metFORMIN (GLUCOPHAGE-XR) 500 MG 24 hr tablet Take 500 mg by mouth daily. 10/07/12   [provider]  Multiple Vitamins-Minerals (MULTIVITAMIN WITH MINERALS) tablet Take 1 tablet by mouth daily.    [provider]  simvastatin (ZOCOR) 40 MG tablet Take 40 mg by mouth daily. 08/30/20   [provider]  TRAVATAN Z 0.004 % SOLN ophthalmic solution Place 1 drop into both eyes at bedtime. 08/17/19   [provider]    Physical Exam: Vitals:   10/25/22 1823 10/25/22 1827 10/25/22 1831 10/25/22 1845  BP:   (!) 145/92 (!) 126/58  Pulse:   81 71  Resp:   20 18  Temp:   98.4 F (36.9 C)   TempSrc:   Axillary   SpO2: 96%  98% 98%  Weight:  72.6 kg    Height:  5' 3.5" (1.613 m)      Physical Exam Vitals reviewed.  Constitutional:      General: She is not in  acute distress. HENT:     Head: Normocephalic and atraumatic.  Eyes:     Extraocular Movements: Extraocular movements intact.  Cardiovascular:     Rate and Rhythm: Normal rate and regular rhythm.     Pulses: Normal pulses.  Pulmonary:     Effort: Pulmonary effort is normal. No respiratory distress.     Breath sounds: Normal breath  sounds. No wheezing or rales.  Abdominal:     General: Bowel sounds are normal. There is distension.     Palpations: Abdomen is soft.     Tenderness: There is abdominal tenderness. There is no guarding.     Comments: Mild generalized tenderness to palpation  Musculoskeletal:     Cervical back: Normal range of motion.     Right lower leg: No edema.     Left lower leg: No edema.  Skin:    General: Skin is warm and dry.  Neurological:     Mental Status: She is alert.     Comments: Somnolent but easily arousable Nonverbal Does not follow commands     Labs on Admission: I have personally reviewed following labs and imaging studies  CBC: Recent Labs  Lab 10/25/22 1832  WBC 8.4  NEUTROABS 5.6  HGB 10.8*  HCT 35.2*  MCV 87.6  PLT 379   Basic Metabolic Panel: Recent Labs  Lab 10/25/22 1832  NA 137  K 4.8  CL 102  CO2 23  GLUCOSE 107*  BUN 15  CREATININE 0.86  CALCIUM 9.7   GFR: Estimated Creatinine Clearance: 42.8 mL/min (by C-G formula based on SCr of 0.86 mg/dL). Liver Function Tests: Recent Labs  Lab 10/25/22 1832  AST 19  ALT 15  ALKPHOS 66  BILITOT 0.5  PROT 7.4  ALBUMIN 3.3*   No results for input(s): "LIPASE", "AMYLASE" in the last 168 hours. Recent Labs  Lab 10/25/22 1859  AMMONIA 21   Coagulation Profile: No results for input(s): "INR", "PROTIME" in the last 168 hours. Cardiac Enzymes: No results for input(s): "CKTOTAL", "CKMB", "CKMBINDEX", "TROPONINI" in the last 168 hours. BNP (last 3 results) No results for input(s): "PROBNP" in the last 8760 hours. HbA1C: No results for input(s): "HGBA1C" in the last 72  hours. CBG: Recent Labs  Lab 10/25/22 1859  GLUCAP 98   Lipid Profile: No results for input(s): "CHOL", "HDL", "LDLCALC", "TRIG", "CHOLHDL", "LDLDIRECT" in the last 72 hours. Thyroid Function Tests: Recent Labs    10/25/22 1859  TSH 2.950   Anemia Panel: No results for input(s): "VITAMINB12", "FOLATE", "FERRITIN", "TIBC", "IRON", "RETICCTPCT" in the last 72 hours. Urine analysis:    Component Value Date/Time   COLORURINE YELLOW 09/20/2019 1630   APPEARANCEUR Cloudy (A) 09/20/2019 1630   LABSPEC >=1.030 (A) 09/20/2019 1630   PHURINE 5.5 09/20/2019 1630   GLUCOSEU NEGATIVE 09/20/2019 1630   HGBUR NEGATIVE 09/20/2019 1630   BILIRUBINUR NEGATIVE 09/20/2019 1630   KETONESUR NEGATIVE 09/20/2019 1630   PROTEINUR NEGATIVE 07/05/2015 1320   UROBILINOGEN 0.2 09/20/2019 1630   NITRITE NEGATIVE 09/20/2019 1630   LEUKOCYTESUR NEGATIVE 09/20/2019 1630    Radiological Exams on Admission: DG Chest Portable 1 View  Result Date: 10/25/2022 CLINICAL DATA:  Altered mental status EXAM: PORTABLE CHEST 1 VIEW COMPARISON:  Chest x-ray 10/27/2020 FINDINGS: The heart size and mediastinal contours are within normal limits. Both lungs are clear. The visualized skeletal structures are unremarkable. IMPRESSION: No active disease. Electronically Signed   By: Darliss Cheney M.D.   On: 10/25/2022 19:34   CT HEAD WO CONTRAST ( )  Result Date: 10/25/2022 CLINICAL DATA:  Head trauma EXAM: CT HEAD WITHOUT CONTRAST TECHNIQUE: Contiguous axial images were obtained from the base of the skull through the vertex without intravenous contrast. RADIATION DOSE REDUCTION: This exam was performed according to the departmental dose-optimization program which includes automated exposure control, adjustment of the mA and/or kV according to patient size and/or use of iterative  reconstruction technique. COMPARISON:  Head CT 10/27/2020 FINDINGS: Brain: No evidence of acute infarction, hemorrhage, hydrocephalus, extra-axial  collection or mass lesion/mass effect. There is moderate diffuse atrophy and mild periventricular white matter hypodensity, unchanged from prior. Vascular: Atherosclerotic calcifications are present within the cavernous internal carotid arteries. Skull: Normal. Negative for fracture or focal lesion. Sinuses/Orbits: There is an air-fluid level in the left maxillary sinus and within ethmoid air cells as well as the bilateral frontal sinuses. Left mastoid effusion is present. Other: None. IMPRESSION: 1. No acute intracranial process. 2. Air-fluid levels in the paranasal sinuses, correlate for acute sinusitis. 3. Left mastoid effusion. Electronically Signed   By: Darliss Cheney M.D.   On: 10/25/2022 19:11    EKG: Independently reviewed.  Sinus rhythm with short PR interval, diffuse borderline T wave abnormalities.  Assessment and Plan  Acute encephalopathy Possibly due to acute viral infection.  She is positive for COVID.  At baseline, patient appears to have advanced dementia.  UA is pending at this time to rule out UTI.  No fever or leukocytosis.  CT head negative for acute intracranial abnormality.  TSH and ammonia levels normal.  No meningeal signs.  Patient is currently somnolent but easily arousable.  She is nonverbal and not following any commands.  Brain MRI and EEG ordered.  COVID infection Per family, patient's caregiver had a viral illness after which patient started having a cough 1.5 weeks ago and was treated with doxycycline.  Patient is not hypoxic in the ED.  No fever or signs of sepsis.  Chest x-ray not suggestive of pneumonia.  No indication for steroids at this time.  Also antiviral therapy unlikely to provide benefit as symptoms started > 5 days ago.  Will check inflammatory markers including ferritin, fibrinogen, D-dimer, CRP, and LDH.  Airborne and contact precautions.  Abdominal distention Patient has mild generalized abdominal tenderness to palpation.  No nausea or vomiting reported  by family.  Previously constipated but now having bowel movements per family.  KUB ordered.  ?Acute acute bacterial sinusitis CT showing air-fluid levels in the paranasal sinuses and left mastoid effusion.  Patient just finished outpatient antibiotic course with doxycycline for cough.  No fever or leukocytosis.  Non-insulin-dependent type 2 diabetes Last A1c 6.3 in May 2017, repeat ordered.  Sensitive sliding scale insulin.  Hypertension: Stable. Hyperlipidemia GERD Pharmacy med rec pending.  DVT prophylaxis: Avoid anticoagulation until KUB done. Code Status: Full Code (discussed with the patient's daughter and granddaughter) Family Communication: Family at bedside. Level of care: Telemetry bed Admission status: It is my clinical opinion that referral for OBSERVATION is reasonable and necessary in this patient based on the above information provided. The aforementioned taken together are felt to place the patient at high risk for further clinical deterioration. However, it is anticipated that the patient may be medically stable for discharge from the hospital within 24 to 48 hours.  John Giovanni MD [facility]  If 7PM-7AM, please contact night-coverage [email or web address]  10/25/2022, 9:55 PM

## 2022-10-26 ENCOUNTER — Observation Stay (HOSPITAL_COMMUNITY): Payer: Medicare Other

## 2022-10-26 DIAGNOSIS — G9340 Encephalopathy, unspecified: Secondary | ICD-10-CM | POA: Diagnosis not present

## 2022-10-26 DIAGNOSIS — R4182 Altered mental status, unspecified: Secondary | ICD-10-CM

## 2022-10-26 LAB — HEMOGLOBIN A1C
Hgb A1c MFr Bld: 6.4 % — ABNORMAL HIGH (ref 4.8–5.6)
Mean Plasma Glucose: 136.98 mg/dL

## 2022-10-26 LAB — FERRITIN: Ferritin: 22 ng/mL (ref 11–307)

## 2022-10-26 LAB — GLUCOSE, CAPILLARY
Glucose-Capillary: 161 mg/dL — ABNORMAL HIGH (ref 70–99)
Glucose-Capillary: 139 mg/dL — ABNORMAL HIGH (ref 70–99)
Glucose-Capillary: 124 mg/dL — ABNORMAL HIGH (ref 70–99)
Glucose-Capillary: 109 mg/dL — ABNORMAL HIGH (ref 70–99)
Glucose-Capillary: 112 mg/dL — ABNORMAL HIGH (ref 70–99)
Glucose-Capillary: 114 mg/dL — ABNORMAL HIGH (ref 70–99)

## 2022-10-26 LAB — C-REACTIVE PROTEIN: CRP: 3.4 mg/dL — ABNORMAL HIGH (ref <1.0)

## 2022-10-26 LAB — D-DIMER, QUANTITATIVE: D-Dimer, Quant: 1.73 ug{FEU}/mL — ABNORMAL HIGH (ref 0.00–0.50)

## 2022-10-26 LAB — LACTATE DEHYDROGENASE: LDH: 122 U/L (ref 98–192)

## 2022-10-26 LAB — FIBRINOGEN: Fibrinogen: 603 mg/dL — ABNORMAL HIGH (ref 210–475)

## 2022-10-26 MED ORDER — WHITE PETROLATUM EX OINT
TOPICAL_OINTMENT | CUTANEOUS | Status: DC | PRN
  Administered 2022-10-26: 0.2 via TOPICAL

## 2022-10-26 NOTE — Progress Notes (Signed)
PROGRESS NOTE  [name]:[id number] DOB: [birth year] DOA: 10/25/2022 PCP: System, Provider Not In   LOS: 0 days   Brief Narrative / Interim history: 89 y.o. female with medical history significant of dementia and bedbound at baseline, type 2 diabetes, hypertension, hyperlipidemia, mild aortic stenosis, Barrett's esophagus, GERD, arthritis, history of breast cancer presenting with altered mental status.  Family reported patient not acting like herself, not following commands, or opening eyes.  EMS reported that patient became responsive once they moved her into the ambulance and was moving all 4 extremities.    Subjective / 24h Interval events: Does not engage in conversation, answers are nonsensical.  No family at bedside  Assesement and Plan: Principal Problem:   Acute encephalopathy Active Problems:   Non-insulin dependent type 2 diabetes mellitus (HCC)   COVID-19 virus infection   Abdominal distention   HTN (hypertension)   HLD (hyperlipidemia)   Principal problem Acute metabolic encephalopathy -possibly due to viral infection, COVID-positive.  There is no hypoxia or pneumonia on the chest x-ray, symptoms have been going on for more than a week, no indication for steroids or antivirals -Unable to reach family today to assess if she is at baseline but remains quite confused and not interacting -MRI negative for CVA  Active problems Sinusitis-just finished antibiotic therapy  DM2-sliding scale  Scheduled Meds:  insulin aspart  0-9 Units Subcutaneous Q4H   Continuous Infusions: PRN Meds:.acetaminophen **OR** acetaminophen  Current Outpatient Medications  Medication Instructions   benzonatate (TESSALON) 200 mg, Oral, 3 times daily   Calcium Carbonate-Vitamin D (CALCIUM-D PO) 1 tablet, Oral, Daily   cetirizine (ZYRTEC) 10 mg, Oral, Daily   Docusate Calcium (STOOL SOFTENER PO) 1 tablet, Oral, 2 times weekly, Dulcolax   doxycycline (VIBRAMYCIN) 100 mg, 2 times daily    escitalopram (LEXAPRO) 20 mg, Oral, Daily at bedtime   Ferrous Sulfate (IRON PO) 325 mg, Oral, Daily   memantine (NAMENDA) 10 MG tablet Take 1 tablet daily   metFORMIN (GLUCOPHAGE-XR) 500 mg, Oral, Daily   Multiple Vitamins-Minerals (MULTIVITAMIN WITH MINERALS) tablet 1 tablet, Oral, Daily,     simvastatin (ZOCOR) 40 mg, Oral, Daily   TRAVATAN Z 0.004 % SOLN ophthalmic solution 1 drop, Both Eyes, Daily at bedtime    Diet Orders (From admission, onward)     Start     Ordered   10/26/22 0224  Diet heart healthy/carb modified Room service appropriate? Yes; Fluid consistency: Thin  Diet effective now       Question Answer Comment  Diet-HS Snack? Nothing   Room service appropriate? Yes   Fluid consistency: Thin      10/26/22 0223            DVT prophylaxis:    Lab Results  Component Value Date   PLT 379 10/25/2022      Code Status: Full Code  Family Communication: no family at bedside. Attempted to reach daughter x 2  Status is: Observation The patient will require care spanning > 2 midnights and should be moved to inpatient because: Encephalopathy   Level of care: Telemetry Medical  Consultants:  none  Objective: Vitals:   10/26/22 0045 10/26/22 0300 10/26/22 0359 10/26/22 0700  BP: 135/63 (!) 145/58 (!) 153/58 (!) 146/84  Pulse: 76 79 78 75  Resp: (!) 24 17    Temp:  98.4 F (36.9 C) 98.4 F (36.9 C) 98.5 F (36.9 C)  TempSrc:  Oral Oral Axillary  SpO2: 98% 98% 94% 95%  Weight:  Height:        Intake/Output Summary (Last 24 hours) at 10/26/2022 1340 Last data filed at 10/26/2022 1308 Gross per 24 hour  Intake 240 ml  Output 400 ml  Net -160 ml   Wt Readings from Last 3 Encounters:  10/25/22 72.6 kg  03/04/22 72.6 kg  01/15/22 90.7 kg    Examination:  Constitutional: NAD Eyes: no scleral icterus ENMT: Mucous membranes are moist.  Neck: normal, supple Respiratory: clear to auscultation bilaterally, no wheezing, no crackles. Normal  respiratory effort. No accessory muscle use.  Cardiovascular: Regular rate and rhythm, no murmurs / rubs / gallops. No LE edema.  Abdomen: non distended, no tenderness. Bowel sounds positive.  Musculoskeletal: no clubbing / cyanosis.    Data Reviewed: I have independently reviewed following labs and imaging studies   CBC Recent Labs  Lab 10/25/22 1832  WBC 8.4  HGB 10.8*  HCT 35.2*  PLT 379  MCV 87.6  MCH 26.9  MCHC 30.7  RDW 16.2*  LYMPHSABS 1.8  MONOABS 0.7  EOSABS 0.2  BASOSABS 0.0    Recent Labs  Lab 10/25/22 1832 10/25/22 1859 10/26/22 0054 10/26/22 0723  NA 137  --   --   --   K 4.8  --   --   --   CL 102  --   --   --   CO2 23  --   --   --   GLUCOSE 107*  --   --   --   BUN 15  --   --   --   CREATININE 0.86  --   --   --   CALCIUM 9.7  --   --   --   AST 19  --   --   --   ALT 15  --   --   --   ALKPHOS 66  --   --   --   BILITOT 0.5  --   --   --   ALBUMIN 3.3*  --   --   --   CRP  --   --   --  3.4*  DDIMER  --   --   --  1.73*  TSH  --  2.950  --   --   HGBA1C  --   --  6.4*  --   AMMONIA  --  21  --   --     ------------------------------------------------------------------------------------------------------------------ No results for input(s): "CHOL", "HDL", "LDLCALC", "TRIG", "CHOLHDL", "LDLDIRECT" in the last 72 hours.  Lab Results  Component Value Date   HGBA1C 6.4 (H) 10/26/2022   ------------------------------------------------------------------------------------------------------------------ Recent Labs    10/25/22 1859  TSH 2.950    Cardiac Enzymes No results for input(s): "CKMB", "TROPONINI", "MYOGLOBIN" in the last 168 hours.  Invalid input(s): "CK" ------------------------------------------------------------------------------------------------------------------ No results found for: "BNP"  CBG: Recent Labs  Lab 10/25/22 1859 10/25/22 2325 10/26/22 0502 10/26/22 0829 10/26/22 1311  GLUCAP 98 114* 161* 139* 124*     Recent Results (from the past 240 hour(s))  SARS Coronavirus 2 by RT PCR (hospital order, performed in [facility] hospital lab) *cepheid single result test* Anterior Nasal Swab     Status: Abnormal   Collection Time: 10/25/22  6:29 PM   Specimen: Anterior Nasal Swab  Result Value Ref Range Status   SARS Coronavirus 2 by RT PCR POSITIVE (A) NEGATIVE Final    Comment: Performed at [facility], [address], [place], [place] [address]  Radiology Studies: MR BRAIN WO CONTRAST  Result Date: 10/26/2022 CLINICAL DATA:  Initial evaluation for delirium. EXAM: MRI HEAD WITHOUT CONTRAST TECHNIQUE: Multiplanar, multiecho pulse sequences of the brain and surrounding structures were obtained without intravenous contrast. COMPARISON:  CT from 10/25/2022. FINDINGS: Brain: Examination moderately degraded by motion artifact, limiting assessment. Moderate to advanced frontotemporal predominant cerebral atrophy. Patchy T2/FLAIR hyperintensity involving the periventricular and deep white matter, consistent with chronic small vessel ischemic disease, most pronounced at the frontotemporal regions as well. No abnormal foci of restricted diffusion to suggest acute or subacute ischemia. Gray-white matter differentiation maintained. No acute intracranial hemorrhage. Few small chronic micro hemorrhages noted within the left cerebral hemisphere. No mass lesion, midline shift or mass effect. Mild ventricular prominence related to global parenchymal volume loss without hydrocephalus. No extra-axial fluid collection. Pituitary gland suprasellar region within normal limits. Vascular: Major intracranial vascular flow voids are maintained. Skull and upper cervical spine: Craniocervical junction within normal limits. Postoperative changes noted within the visualized upper cervical spine. Bone marrow signal intensity normal. No scalp soft tissue abnormality. Sinuses/Orbits: Globes and orbital soft tissues within normal  limits. Changes of chronic pan sinusitis noted. Superimposed air-fluid level within the left maxillary sinus. Moderate left with mild right mastoid effusions. Visualized nasopharynx unremarkable. Other: None. IMPRESSION: 1. No acute intracranial abnormality. 2. Moderate to advanced frontotemporal predominant cerebral atrophy with chronic small vessel ischemic disease. 3. Changes of chronic pan sinusitis. Superimposed air-fluid level within the left maxillary sinus raises the possibility of acute on chronic disease. Correlation with physical exam recommended. Electronically Signed   By: Rise Mu M.D.   On: 10/26/2022 00:51   DG Abd 1 View  Result Date: 10/25/2022 CLINICAL DATA:  Bed-bound patient with dementia. Altered mental status. Not following commands or opening eyes. EXAM: ABDOMEN - 1 VIEW COMPARISON:  None Available. FINDINGS: Exam compromised by body habitus. The bowel gas pattern is normal. No radio-opaque calculi or other significant radiographic abnormality are seen. IMPRESSION: No acute radiographic abnormality. Electronically Signed   By: Minerva Fester M.D.   On: 10/25/2022 23:11   DG Chest Portable 1 View  Result Date: 10/25/2022 CLINICAL DATA:  Altered mental status EXAM: PORTABLE CHEST 1 VIEW COMPARISON:  Chest x-ray 10/27/2020 FINDINGS: The heart size and mediastinal contours are within normal limits. Both lungs are clear. The visualized skeletal structures are unremarkable. IMPRESSION: No active disease. Electronically Signed   By: Darliss Cheney M.D.   On: 10/25/2022 19:34   CT HEAD WO CONTRAST ( )  Result Date: 10/25/2022 CLINICAL DATA:  Head trauma EXAM: CT HEAD WITHOUT CONTRAST TECHNIQUE: Contiguous axial images were obtained from the base of the skull through the vertex without intravenous contrast. RADIATION DOSE REDUCTION: This exam was performed according to the departmental dose-optimization program which includes automated exposure control, adjustment of the mA  and/or kV according to patient size and/or use of iterative reconstruction technique. COMPARISON:  Head CT 10/27/2020 FINDINGS: Brain: No evidence of acute infarction, hemorrhage, hydrocephalus, extra-axial collection or mass lesion/mass effect. There is moderate diffuse atrophy and mild periventricular white matter hypodensity, unchanged from prior. Vascular: Atherosclerotic calcifications are present within the cavernous internal carotid arteries. Skull: Normal. Negative for fracture or focal lesion. Sinuses/Orbits: There is an air-fluid level in the left maxillary sinus and within ethmoid air cells as well as the bilateral frontal sinuses. Left mastoid effusion is present. Other: None. IMPRESSION: 1. No acute intracranial process. 2. Air-fluid levels in the paranasal sinuses, correlate for acute sinusitis. 3. Left mastoid effusion.  Electronically Signed   By: Darliss Cheney M.D.   On: 10/25/2022 19:11     Pamella Pert, MD, PhD [facility]  Between 7 am - 7 pm I am available, please contact me via Amion (for emergencies) or Securechat (non urgent messages)  Between 7 pm - 7 am I am not available, please contact night coverage MD/APP via Amion

## 2022-10-26 NOTE — Progress Notes (Signed)
EEG complete - results pending 

## 2022-10-26 NOTE — Procedures (Signed)
History: 89 year old female with encephalopathy  Sedation: None  Technique: This EEG was acquired with electrodes placed according to the International 10-20 electrode system (including Fp1, Fp2, F3, F4, C3, C4, P3, P4, O1, O2, T3, T4, T5, T6, A1, A2, Fz, Cz, Pz). The following electrodes were missing or displaced: none.  Patient State: Awake and drowsy  Background: There is a posterior dominant rhythm of 8 to 9 Hz which is seen bilaterally.  There is mild generalized irregular slow activity, both delta and theta range intermixed into the background throughout the study.  Well-formed sleep is not observed.  Photic stimulation: Physiologic driving is not performed  EEG Abnormalities: Generalized irregular slow activity  Clinical Interpretation: This EEG is consistent with a mild generalized nonspecific cerebral dysfunction (encephalopathy). There was no seizure or seizure predisposition recorded on this study. Please note that lack of epileptiform activity on EEG does not preclude the possibility of epilepsy.   Ritta Slot, MD [facility] [phone]  If 7pm- 7am, please page neurology on call as listed in AMION.

## 2022-10-26 NOTE — ED Notes (Signed)
ED TO INPATIENT HANDOFF REPORT  ED Nurse Name and Phone #: Raquel Sarna [phone]  S Name/Age/Gender [name] 89 y.o. female Room/Bed: TRABC/TRABC  Code Status   Code Status: Full Code  Home/SNF/Other Home Patient oriented to: self Is this baseline? Yes   Triage Complete: Triage complete  Chief Complaint AMS (altered mental status) [R41.82]  Triage Note Per [facility] pt coming from home where she lives with family. Patient has dementia and is bed bound at baseline. Daughter called out stating patient was not acting herself, not following commands or opening eyes. EMS report patient became responsive once moved into truck. Report shallow breathing with lear lung sounds. On doxycycline for cough. 94% on RA and placed on 2L [place]. State 5pm as LKN. EDP at bedside.    Allergies Allergies  Allergen Reactions   Peanut-Containing Drug Products    Penicillins Hives   Shellfish Allergy Hives    Shrimp only   Tape Rash    PAPER TAPE ONLY    Level of Care/Admitting Diagnosis ED Disposition     ED Disposition  Admit   Condition  --   Comment  Hospital [facility] [[id number]]  Level of Care: Telemetry Medical [104]  May place patient in observation at [facility] or [facility] if equivalent level of care is available:: Yes  Covid Evaluation: Confirmed COVID Positive  Diagnosis: AMS (altered mental status) [[id number]]  Admitting Physician: John Giovanni [[id number]]  Attending Physician: John Giovanni [[id number]]          B Medical/Surgery History Past Medical History:  Diagnosis Date   Arthritis    knees   Barrett esophagus    Bladder atony    uses a large pad for incontinence    Breast cancer, Left, 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 unsure why this is noted in her hx.     Intraductal carcinoma in situ of right breast    Memory loss    Mild mood disorder (HCC)    Non-toxic goiter    Normal cardiac stress test 2000   done at [facility]   Obesity    Osteoarthritis    Personal history of malignant neoplasm of breast    Pulmonary hypertension (HCC)    Thyroid nodule 2007   biopsy- benign   Trouble walking    UI (urinary incontinence)    Visual hallucinations    Vitamin D deficiency    Past Surgical History:  Procedure Laterality Date   ADENOIDECTOMY     BREAST BIOPSY  1974   right   BREAST BIOPSY  1977   left   BREAST BIOPSY  2012   Low grade ductal carcinoma ( needle loc Dr Si Gaul)   BREAST EXCISIONAL BIOPSY Right    BREAST LUMPECTOMY  01/22/2011   BREAST LUMPECTOMY  114/14/2012   Procedure: LUMPECTOMY;  Surgeon: [name], MD;  Location: [facility] OR;  Service: General;  Laterality: Left;  Needle localization  @ [facility]  12:30   BREAST LUMPECTOMY W/ NEEDLE LOCALIZATION  01/22/2011   Left - Dr Jamey Ripa   COLONOSCOPY  2009   EYE SURGERY Left    laser for glaucoma   TONSILLECTOMY  1944   TOTAL KNEE ARTHROPLASTY Right 07/09/2015   Procedure: RIGHT TOTAL KNEE ARTHROPLASTY;  Surgeon: Dannielle Huh, MD;  Location: [facility] OR;  Service: Orthopedics;  Laterality: Right;   VAGINAL HYSTERECTOMY     partial     A IV Location/Drains/Wounds Patient Lines/Drains/Airways Status     Active Line/Drains/Airways     Name Placement date Placement time Site Days   Incision 01/22/11 Breast Left 01/22/11  1715  -- 4295   Incision (Closed) 07/09/15 Knee Right 07/09/15  0810  -- 2666            Intake/Output Last 24 hours No intake or output data in the 24 hours ending 10/26/22 0306  Labs/Imaging Results for orders placed or performed during the hospital encounter  of 10/25/22 (from the past 48 hour(s))  SARS Coronavirus 2 by RT PCR (hospital order, performed in [facility] hospital lab) *cepheid single result test* Anterior Nasal Swab     Status: Abnormal   Collection Time: 10/25/22  6:29 PM   Specimen: Anterior Nasal Swab  Result Value Ref Range   SARS Coronavirus 2 by RT PCR POSITIVE (A) NEGATIVE    Comment: Performed at [facility], [address] [place], [place] [address]  CBC with Differential     Status: Abnormal   Collection Time: 10/25/22  6:32 PM  Result Value Ref Range   WBC 8.4 4.0 - 10.5 K/uL   RBC 4.02 3.87 - 5.11 MIL/uL   Hemoglobin 10.8 (L) 12.0 - 15.0 g/dL   HCT 75.6 (L) 43.3 - 29.5 %   MCV 87.6 80.0 - 100.0 fL   MCH 26.9 26.0 - 34.0 pg   MCHC 30.7 30.0 - 36.0 g/dL   RDW 18.8 (H) 41.6 - 60.6 %   Platelets 379 150 - 400 K/uL   nRBC 0.0 0.0 - 0.2 %   Neutrophils Relative % 66 %   Neutro Abs 5.6 1.7 - 7.7 K/uL   Lymphocytes Relative 22 %   Lymphs Abs 1.8 0.7 - 4.0 K/uL   Monocytes Relative 8 %   Monocytes Absolute 0.7 0.1 - 1.0 K/uL   Eosinophils Relative 3 %   Eosinophils Absolute 0.2 0.0 - 0.5 K/uL   Basophils Relative 1 %   Basophils Absolute 0.0 0.0 - 0.1 K/uL   Immature Granulocytes 0 %   Abs Immature Granulocytes 0.02 0.00 - 0.07 K/uL    Comment: Performed at [facility], [address] [place], [place] [address]  Comprehensive metabolic panel     Status: Abnormal   Collection Time: 10/25/22  6:32 PM  Result Value Ref Range   Sodium 137 135 - 145 mmol/L   Potassium 4.8 3.5 - 5.1 mmol/L   Chloride 102 98 - 111 mmol/L   CO2 23 22 - 32 mmol/L   Glucose, Bld 107 (H) 70 - 99 mg/dL    Comment: Glucose reference range applies only to samples taken after fasting for at least 8 hours.   BUN 15 8 - 23 mg/dL   Creatinine, Ser 1.09 0.44 - 1.00 mg/dL   Calcium 9.7 8.9 - 32.3 mg/dL   Total Protein 7.4 6.5 - 8.1 g/dL   Albumin 3.3 (L) 3.5 - 5.0 g/dL   AST 19 15 - 41 U/L   ALT 15 0 - 44 U/L  Alkaline  Phosphatase 66 38 - 126 U/L   Total Bilirubin 0.5 0.3 - 1.2 mg/dL   GFR, Estimated >09 >81 mL/min    Comment: (NOTE) Calculated using the CKD-EPI Creatinine Equation (2021)    Anion gap 12 5 - 15    Comment: Performed at [facility], [address], [place], [place] [address]  POC CBG, ED     Status: None   Collection Time: 10/25/22  6:59 PM  Result Value Ref Range   Glucose-Capillary 98 70 - 99 mg/dL    Comment: Glucose reference range applies only to samples taken after fasting for at least 8 hours.  TSH     Status: None   Collection Time: 10/25/22  6:59 PM  Result Value Ref Range   TSH 2.950 0.350 - 4.500 uIU/mL    Comment: Performed by a 3rd Generation assay with a functional sensitivity of <=0.01 uIU/mL. Performed at [facility], [address], [place], [place] [address]   Ammonia     Status: None   Collection Time: 10/25/22  6:59 PM  Result Value Ref Range   Ammonia 21 9 - 35 umol/L    Comment: Performed at [facility], [address], [place], [place] [address]  CBG monitoring, ED     Status: Abnormal   Collection Time: 10/25/22 11:25 PM  Result Value Ref Range   Glucose-Capillary 114 (H) 70 - 99 mg/dL    Comment: Glucose reference range applies only to samples taken after fasting for at least 8 hours.  Hemoglobin A1c     Status: Abnormal   Collection Time: 10/26/22 12:54 AM  Result Value Ref Range   Hgb A1c MFr Bld 6.4 (H) 4.8 - 5.6 %    Comment: (NOTE) Pre diabetes:          5.7%-6.4%  Diabetes:              >6.4%  Glycemic control for   <7.0% adults with diabetes    Mean Plasma Glucose 136.98 mg/dL    Comment: Performed at [facility], [address], [place], [place] [address]   MR BRAIN WO CONTRAST  Result Date: 10/26/2022 CLINICAL DATA:  Initial evaluation for delirium. EXAM: MRI HEAD WITHOUT CONTRAST TECHNIQUE: Multiplanar, multiecho pulse sequences of the brain and surrounding structures were obtained without intravenous  contrast. COMPARISON:  CT from 10/25/2022. FINDINGS: Brain: Examination moderately degraded by motion artifact, limiting assessment. Moderate to advanced frontotemporal predominant cerebral atrophy. Patchy T2/FLAIR hyperintensity involving the periventricular and deep white matter, consistent with chronic small vessel ischemic disease, most pronounced at the frontotemporal regions as well. No abnormal foci of restricted diffusion to suggest acute or subacute ischemia. Gray-white matter differentiation maintained. No acute intracranial hemorrhage. Few small chronic micro hemorrhages noted within the left cerebral hemisphere. No mass lesion, midline shift or mass effect. Mild ventricular prominence related to global parenchymal volume loss without hydrocephalus. No extra-axial fluid collection. Pituitary gland suprasellar region within normal limits. Vascular: Major intracranial vascular flow voids are maintained. Skull and upper cervical spine: Craniocervical junction within normal limits. Postoperative changes noted within the visualized upper cervical spine. Bone marrow signal intensity normal. No scalp soft tissue abnormality. Sinuses/Orbits: Globes and orbital soft tissues within normal limits. Changes of chronic pan sinusitis noted. Superimposed air-fluid level within the left maxillary sinus. Moderate left with mild right mastoid effusions. Visualized nasopharynx unremarkable. Other: None. IMPRESSION: 1. No acute intracranial abnormality. 2. Moderate to advanced frontotemporal predominant  cerebral atrophy with chronic small vessel ischemic disease. 3. Changes of chronic pan sinusitis. Superimposed air-fluid level within the left maxillary sinus raises the possibility of acute on chronic disease. Correlation with physical exam recommended. Electronically Signed   By: Rise Mu M.D.   On: 10/26/2022 00:51   DG Abd 1 View  Result Date: 10/25/2022 CLINICAL DATA:  Bed-bound patient with dementia.  Altered mental status. Not following commands or opening eyes. EXAM: ABDOMEN - 1 VIEW COMPARISON:  None Available. FINDINGS: Exam compromised by body habitus. The bowel gas pattern is normal. No radio-opaque calculi or other significant radiographic abnormality are seen. IMPRESSION: No acute radiographic abnormality. Electronically Signed   By: Minerva Fester M.D.   On: 10/25/2022 23:11   DG Chest Portable 1 View  Result Date: 10/25/2022 CLINICAL DATA:  Altered mental status EXAM: PORTABLE CHEST 1 VIEW COMPARISON:  Chest x-ray 10/27/2020 FINDINGS: The heart size and mediastinal contours are within normal limits. Both lungs are clear. The visualized skeletal structures are unremarkable. IMPRESSION: No active disease. Electronically Signed   By: Darliss Cheney M.D.   On: 10/25/2022 19:34   CT HEAD WO CONTRAST ( )  Result Date: 10/25/2022 CLINICAL DATA:  Head trauma EXAM: CT HEAD WITHOUT CONTRAST TECHNIQUE: Contiguous axial images were obtained from the base of the skull through the vertex without intravenous contrast. RADIATION DOSE REDUCTION: This exam was performed according to the departmental dose-optimization program which includes automated exposure control, adjustment of the mA and/or kV according to patient size and/or use of iterative reconstruction technique. COMPARISON:  Head CT 10/27/2020 FINDINGS: Brain: No evidence of acute infarction, hemorrhage, hydrocephalus, extra-axial collection or mass lesion/mass effect. There is moderate diffuse atrophy and mild periventricular white matter hypodensity, unchanged from prior. Vascular: Atherosclerotic calcifications are present within the cavernous internal carotid arteries. Skull: Normal. Negative for fracture or focal lesion. Sinuses/Orbits: There is an air-fluid level in the left maxillary sinus and within ethmoid air cells as well as the bilateral frontal sinuses. Left mastoid effusion is present. Other: None. IMPRESSION: 1. No acute intracranial  process. 2. Air-fluid levels in the paranasal sinuses, correlate for acute sinusitis. 3. Left mastoid effusion. Electronically Signed   By: Darliss Cheney M.D.   On: 10/25/2022 19:11    Pending Labs Unresulted Labs (From admission, onward)     Start     Ordered   10/26/22 0500  Ferritin  Tomorrow morning,   R        10/25/22 2246   10/26/22 0500  Fibrinogen  Tomorrow morning,   R        10/25/22 2246   10/26/22 0500  D-dimer, quantitative  Tomorrow morning,   R        10/25/22 2246   10/26/22 0500  C-reactive protein  Tomorrow morning,   R        10/25/22 2246   10/26/22 0500  Lactate dehydrogenase  Tomorrow morning,   R        10/25/22 2246   10/25/22 1828  Urinalysis, Routine w reflex microscopic -Urine, Clean Catch  Once,   URGENT       Question:  Specimen Source  Answer:  Urine, Clean Catch   10/25/22 1827            Vitals/Pain Today's Vitals   10/25/22 1845 10/25/22 2315 10/25/22 2321 10/26/22 0045  BP: (!) 126/58   135/63  Pulse: 71 70  76  Resp: 18 20  (!) 24  Temp:   98.4 F (  36.9 C)   TempSrc:   Oral   SpO2: 98% 96%  98%  Weight:      Height:        Isolation Precautions Airborne and Contact precautions  Medications Medications  acetaminophen (TYLENOL) tablet 650 mg (has no administration in time range)    Or  acetaminophen (TYLENOL) suppository 650 mg (has no administration in time range)  insulin aspart (novoLOG) injection 0-9 Units ( Subcutaneous Not Given 10/25/22 2326)    Mobility non-ambulatory     Focused Assessments Neuro Assessment Handoff:  Swallow screen pass? Yes  Cardiac Rhythm: Normal sinus rhythm       Neuro Assessment: Exceptions to WDL Neuro Checks:      Has TPA been given? No If patient is a Neuro Trauma and patient is going to OR before floor call report to 4N Charge nurse: [phone] or [phone]   R Recommendations: See Admitting Provider Note  Report given to:   Additional Notes: Pt a/o x 1 Unable to  ambulate

## 2022-10-26 NOTE — Plan of Care (Signed)
  Problem: Fluid Volume: Goal: Ability to maintain a balanced intake and output will improve Outcome: Progressing   Problem: Metabolic: Goal: Ability to maintain appropriate glucose levels will improve Outcome: Progressing   Problem: Nutritional: Goal: Maintenance of adequate nutrition will improve Outcome: Progressing Goal: Progress toward achieving an optimal weight will improve Outcome: Progressing   Problem: Skin Integrity: Goal: Risk for impaired skin integrity will decrease Outcome: Progressing

## 2022-10-27 DIAGNOSIS — G9340 Encephalopathy, unspecified: Secondary | ICD-10-CM | POA: Diagnosis not present

## 2022-10-27 LAB — COMPREHENSIVE METABOLIC PANEL
ALT: 13 U/L (ref 0–44)
AST: 17 U/L (ref 15–41)
Albumin: 2.9 g/dL — ABNORMAL LOW (ref 3.5–5.0)
Alkaline Phosphatase: 57 U/L (ref 38–126)
Anion gap: 9 (ref 5–15)
BUN: 20 mg/dL (ref 8–23)
CO2: 23 mmol/L (ref 22–32)
Calcium: 9.2 mg/dL (ref 8.9–10.3)
Chloride: 106 mmol/L (ref 98–111)
Creatinine, Ser: 1.02 mg/dL — ABNORMAL HIGH (ref 0.44–1.00)
GFR, Estimated: 53 mL/min — ABNORMAL LOW (ref >60)
Glucose, Bld: 117 mg/dL — ABNORMAL HIGH (ref 70–99)
Potassium: 4.1 mmol/L (ref 3.5–5.1)
Sodium: 138 mmol/L (ref 135–145)
Total Bilirubin: 0.3 mg/dL (ref 0.3–1.2)
Total Protein: 6.8 g/dL (ref 6.5–8.1)

## 2022-10-27 LAB — CBC
HCT: 34.3 % — ABNORMAL LOW (ref 36.0–46.0)
Hemoglobin: 10.3 g/dL — ABNORMAL LOW (ref 12.0–15.0)
MCH: 25.7 pg — ABNORMAL LOW (ref 26.0–34.0)
MCHC: 30 g/dL (ref 30.0–36.0)
MCV: 85.5 fL (ref 80.0–100.0)
Platelets: 342 10*3/uL (ref 150–400)
RBC: 4.01 MIL/uL (ref 3.87–5.11)
RDW: 16.3 % — ABNORMAL HIGH (ref 11.5–15.5)
WBC: 6.3 10*3/uL (ref 4.0–10.5)
nRBC: 0 % (ref 0.0–0.2)

## 2022-10-27 LAB — GLUCOSE, CAPILLARY
Glucose-Capillary: 105 mg/dL — ABNORMAL HIGH (ref 70–99)
Glucose-Capillary: 103 mg/dL — ABNORMAL HIGH (ref 70–99)

## 2022-10-27 LAB — MAGNESIUM: Magnesium: 2.1 mg/dL (ref 1.7–2.4)

## 2022-10-27 NOTE — Discharge Summary (Signed)
Physician Discharge Summary  [name] YQM:[id number] DOB: May 18, 1931 DOA: 10/25/2022  PCP: System, Provider Not In  Admit date: 10/25/2022 Discharge date: 10/27/2022  Admitted From: home Disposition:  home  Recommendations for Outpatient Follow-up:  Follow up with PCP in 1-2 weeks  Home Health: none Equipment/Devices: none  Discharge Condition: stable CODE STATUS: Full code Diet Orders (From admission, onward)     Start     Ordered   10/26/22 0224  Diet heart healthy/carb modified Room service appropriate? Yes; Fluid consistency: Thin  Diet effective now       Question Answer Comment  Diet-HS Snack? Nothing   Room service appropriate? Yes   Fluid consistency: Thin      10/26/22 0223            HPI: Per admitting MD, [name] is a 89 y.o. female with medical history significant of dementia and bedbound at baseline, type 2 diabetes, hypertension, hyperlipidemia, mild aortic stenosis, Barrett's esophagus, GERD, arthritis, history of breast cancer presenting with altered mental status.  Family reported patient not acting like herself, not following commands, or opening eyes.  EMS reported that patient became responsive once they moved her into the ambulance and was moving all 4 extremities.  Oxygen saturation 94% on room air and was placed on 2 L [place]. Patient is somnolent but easily arousable.  She is nonverbal and does not follow commands.  History provided by her daughter and granddaughter at bedside.  Family states patient has advanced dementia and is bedbound at baseline.  She is normally able to talk a little and recognizes family members but sometimes forgets.  Today she was doing okay until 4:30 or 5 PM when family noticed that patient became less responsive, staring, and her speech was slurred.  Her caregiver recently had a viral illness after which patient started having a cough.  She was started on doxycycline about 1.5 weeks ago.  She is otherwise eating, no nausea or  vomiting.  She was previously constipated but now having daily bowel movements.   Hospital Course / Discharge diagnoses: Principal Problem:   Acute encephalopathy Active Problems:   Non-insulin dependent type 2 diabetes mellitus (HCC)   COVID-19 virus infection   Abdominal distention   HTN (hypertension)   HLD (hyperlipidemia)   Principal problem Acute metabolic encephalopathy -possibly due to viral infection, COVID-positive.  There is no hypoxia or pneumonia on the chest x-ray, symptoms have been going on for more than a week, no indication for steroids or antivirals.  She is improving, afebrile, has remained on room air, eating well and will be discharged home in stable condition.  Of note, underwent an MRI which was negative for CVA   Active problems Sinusitis-just finished antibiotic therapy HLD - continue home statin Dementia - at baseline DM2-continue home meds  Lab Results  Component Value Date   HGBA1C 6.4 (H) 10/26/2022   Sepsis ruled out   Discharge Instructions   Allergies as of 10/27/2022       Reactions   Peanut-containing Drug Products    Penicillins Hives   Shellfish Allergy Hives   Shrimp only   Tape Rash   PAPER TAPE ONLY        Medication List     STOP taking these medications    doxycycline 100 MG capsule Commonly known as: VIBRAMYCIN       TAKE these medications    benzonatate 200 MG capsule Commonly known as: TESSALON Take 200 mg by mouth  in the morning, at noon, and at bedtime.   CALCIUM-D PO Take 1 tablet by mouth daily.   cetirizine 10 MG tablet Commonly known as: ZYRTEC Take 10 mg by mouth daily.   escitalopram 20 MG tablet Commonly known as: LEXAPRO Take 1 tablet (20 mg total) by mouth at bedtime.   IRON PO Take 325 mg by mouth daily.   memantine 10 MG tablet Commonly known as: NAMENDA Take 1 tablet daily   metFORMIN 500 MG 24 hr tablet Commonly known as: GLUCOPHAGE-XR Take 500 mg by mouth daily.    multivitamin with minerals tablet Take 1 tablet by mouth daily.   simvastatin 40 MG tablet Commonly known as: ZOCOR Take 40 mg by mouth daily.   STOOL SOFTENER PO Take 1 tablet by mouth 2 (two) times a week. Dulcolax   Travatan Z 0.004 % Soln ophthalmic solution Generic drug: Travoprost (BAK Free) Place 1 drop into both eyes at bedtime.       Consultations: none  Procedures/Studies:  EEG adult  Result Date: 10/26/2022 Rejeana Brock, MD     10/26/2022  3:09 PM History: 89 year old female with encephalopathy Sedation: None Technique: This EEG was acquired with electrodes placed according to the International 10-20 electrode system (including Fp1, Fp2, F3, F4, C3, C4, P3, P4, O1, O2, T3, T4, T5, T6, A1, A2, Fz, Cz, Pz). The following electrodes were missing or displaced: none. Patient State: Awake and drowsy Background: There is a posterior dominant rhythm of 8 to 9 Hz which is seen bilaterally.  There is mild generalized irregular slow activity, both delta and theta range intermixed into the background throughout the study.  Well-formed sleep is not observed. Photic stimulation: Physiologic driving is not performed EEG Abnormalities: Generalized irregular slow activity Clinical Interpretation: This EEG is consistent with a mild generalized nonspecific cerebral dysfunction (encephalopathy). There was no seizure or seizure predisposition recorded on this study. Please note that lack of epileptiform activity on EEG does not preclude the possibility of epilepsy. Ritta Slot, MD [facility] [phone] If 7pm- 7am, please page neurology on call as listed in AMION.  MR BRAIN WO CONTRAST  Result Date: 10/26/2022 CLINICAL DATA:  Initial evaluation for delirium. EXAM: MRI HEAD WITHOUT CONTRAST TECHNIQUE: Multiplanar, multiecho pulse sequences of the brain and surrounding structures were obtained without intravenous contrast. COMPARISON:  CT from 10/25/2022. FINDINGS:  Brain: Examination moderately degraded by motion artifact, limiting assessment. Moderate to advanced frontotemporal predominant cerebral atrophy. Patchy T2/FLAIR hyperintensity involving the periventricular and deep white matter, consistent with chronic small vessel ischemic disease, most pronounced at the frontotemporal regions as well. No abnormal foci of restricted diffusion to suggest acute or subacute ischemia. Gray-white matter differentiation maintained. No acute intracranial hemorrhage. Few small chronic micro hemorrhages noted within the left cerebral hemisphere. No mass lesion, midline shift or mass effect. Mild ventricular prominence related to global parenchymal volume loss without hydrocephalus. No extra-axial fluid collection. Pituitary gland suprasellar region within normal limits. Vascular: Major intracranial vascular flow voids are maintained. Skull and upper cervical spine: Craniocervical junction within normal limits. Postoperative changes noted within the visualized upper cervical spine. Bone marrow signal intensity normal. No scalp soft tissue abnormality. Sinuses/Orbits: Globes and orbital soft tissues within normal limits. Changes of chronic pan sinusitis noted. Superimposed air-fluid level within the left maxillary sinus. Moderate left with mild right mastoid effusions. Visualized nasopharynx unremarkable. Other: None. IMPRESSION: 1. No acute intracranial abnormality. 2. Moderate to advanced frontotemporal predominant cerebral atrophy with chronic small vessel ischemic disease. 3.  Changes of chronic pan sinusitis. Superimposed air-fluid level within the left maxillary sinus raises the possibility of acute on chronic disease. Correlation with physical exam recommended. Electronically Signed   By: Rise Mu M.D.   On: 10/26/2022 00:51   DG Abd 1 View  Result Date: 10/25/2022 CLINICAL DATA:  Bed-bound patient with dementia. Altered mental status. Not following commands or opening  eyes. EXAM: ABDOMEN - 1 VIEW COMPARISON:  None Available. FINDINGS: Exam compromised by body habitus. The bowel gas pattern is normal. No radio-opaque calculi or other significant radiographic abnormality are seen. IMPRESSION: No acute radiographic abnormality. Electronically Signed   By: Minerva Fester M.D.   On: 10/25/2022 23:11   DG Chest Portable 1 View  Result Date: 10/25/2022 CLINICAL DATA:  Altered mental status EXAM: PORTABLE CHEST 1 VIEW COMPARISON:  Chest x-ray 10/27/2020 FINDINGS: The heart size and mediastinal contours are within normal limits. Both lungs are clear. The visualized skeletal structures are unremarkable. IMPRESSION: No active disease. Electronically Signed   By: Darliss Cheney M.D.   On: 10/25/2022 19:34   CT HEAD WO CONTRAST ( )  Result Date: 10/25/2022 CLINICAL DATA:  Head trauma EXAM: CT HEAD WITHOUT CONTRAST TECHNIQUE: Contiguous axial images were obtained from the base of the skull through the vertex without intravenous contrast. RADIATION DOSE REDUCTION: This exam was performed according to the departmental dose-optimization program which includes automated exposure control, adjustment of the mA and/or kV according to patient size and/or use of iterative reconstruction technique. COMPARISON:  Head CT 10/27/2020 FINDINGS: Brain: No evidence of acute infarction, hemorrhage, hydrocephalus, extra-axial collection or mass lesion/mass effect. There is moderate diffuse atrophy and mild periventricular white matter hypodensity, unchanged from prior. Vascular: Atherosclerotic calcifications are present within the cavernous internal carotid arteries. Skull: Normal. Negative for fracture or focal lesion. Sinuses/Orbits: There is an air-fluid level in the left maxillary sinus and within ethmoid air cells as well as the bilateral frontal sinuses. Left mastoid effusion is present. Other: None. IMPRESSION: 1. No acute intracranial process. 2. Air-fluid levels in the paranasal sinuses,  correlate for acute sinusitis. 3. Left mastoid effusion. Electronically Signed   By: Darliss Cheney M.D.   On: 10/25/2022 19:11    Subjective: - no chest pain, shortness of breath, no abdominal pain, nausea or vomiting.   Discharge Exam: BP (!) 149/61   Pulse 69   Temp 98 F (36.7 C) (Oral)   Resp 16   Ht 5' 3.5" (1.613 m)   Wt 72.6 kg   SpO2 97%   BMI 27.90 kg/m   General: Pt is alert, awake, not in acute distress Cardiovascular: RRR, S1/S2 +, no rubs, no gallops Respiratory: CTA bilaterally, no wheezing, no rhonchi Abdominal: Soft, NT, ND, bowel sounds + Extremities: no edema, no cyanosis    The results of significant diagnostics from this hospitalization (including imaging, microbiology, ancillary and laboratory) are listed below for reference.     Microbiology: Recent Results (from the past 240 hour(s))  SARS Coronavirus 2 by RT PCR (hospital order, performed in [facility] hospital lab) *cepheid single result test* Anterior Nasal Swab     Status: Abnormal   Collection Time: 10/25/22  6:29 PM   Specimen: Anterior Nasal Swab  Result Value Ref Range Status   SARS Coronavirus 2 by RT PCR POSITIVE (A) NEGATIVE Final    Comment: Performed at [facility], [address] [place], [place] [address]     Labs: Basic Metabolic Panel: Recent Labs  Lab 10/25/22 1832 10/27/22  0456  NA 137 138  K 4.8 4.1  CL 102 106  CO2 23 23  GLUCOSE 107* 117*  BUN 15 20  CREATININE 0.86 1.02*  CALCIUM 9.7 9.2  MG  --  2.1   Liver Function Tests: Recent Labs  Lab 10/25/22 1832 10/27/22 0456  AST 19 17  ALT 15 13  ALKPHOS 66 57  BILITOT 0.5 0.3  PROT 7.4 6.8  ALBUMIN 3.3* 2.9*   CBC: Recent Labs  Lab 10/25/22 1832 10/27/22 0456  WBC 8.4 6.3  NEUTROABS 5.6  --   HGB 10.8* 10.3*  HCT 35.2* 34.3*  MCV 87.6 85.5  PLT 379 342   CBG: Recent Labs  Lab 10/26/22 1311 10/26/22 1625 10/26/22 2155 10/26/22 2325 10/27/22 0531  GLUCAP 124* 109* 112* 114* 105*    Hgb A1c Recent Labs    10/26/22 0054  HGBA1C 6.4*   Lipid Profile No results for input(s): "CHOL", "HDL", "LDLCALC", "TRIG", "CHOLHDL", "LDLDIRECT" in the last 72 hours. Thyroid function studies Recent Labs    10/25/22 1859  TSH 2.950   Urinalysis    Component Value Date/Time   COLORURINE YELLOW 09/20/2019 1630   APPEARANCEUR Cloudy (A) 09/20/2019 1630   LABSPEC >=1.030 (A) 09/20/2019 1630   PHURINE 5.5 09/20/2019 1630   GLUCOSEU NEGATIVE 09/20/2019 1630   HGBUR NEGATIVE 09/20/2019 1630   BILIRUBINUR NEGATIVE 09/20/2019 1630   KETONESUR NEGATIVE 09/20/2019 1630   PROTEINUR NEGATIVE 07/05/2015 1320   UROBILINOGEN 0.2 09/20/2019 1630   NITRITE NEGATIVE 09/20/2019 1630   LEUKOCYTESUR NEGATIVE 09/20/2019 1630    FURTHER DISCHARGE INSTRUCTIONS:   Get Medicines reviewed and adjusted: Please take all your medications with you for your next visit with your Primary MD   Laboratory/radiological data: Please request your Primary MD to go over all hospital tests and procedure/radiological results at the follow up, please ask your Primary MD to get all Hospital records sent to his/her office.   In some cases, they will be blood work, cultures and biopsy results pending at the time of your discharge. Please request that your primary care M.D. goes through all the records of your hospital data and follows up on these results.   Also Note the following: If you experience worsening of your admission symptoms, develop shortness of breath, life threatening emergency, suicidal or homicidal thoughts you must seek medical attention immediately by calling 911 or calling your MD immediately  if symptoms less severe.   You must read complete instructions/literature along with all the possible adverse reactions/side effects for all the Medicines you take and that have been prescribed to you. Take any new Medicines after you have completely understood and accpet all the possible adverse  reactions/side effects.    Do not drive when taking Pain medications or sleeping medications (Benzodaizepines)   Do not take more than prescribed Pain, Sleep and Anxiety Medications. It is not advisable to combine anxiety,sleep and pain medications without talking with your primary care practitioner   Special Instructions: If you have smoked or chewed Tobacco  in the last 2 yrs please stop smoking, stop any regular Alcohol  and or any Recreational drug use.   Wear Seat belts while driving.   Please note: You were cared for by a hospitalist during your hospital stay. Once you are discharged, your primary care physician will handle any further medical issues. Please note that NO REFILLS for any discharge medications will be authorized once you are discharged, as it is imperative that you return to your  primary care physician (or establish a relationship with a primary care physician if you do not have one) for your post hospital discharge needs so that they can reassess your need for medications and monitor your lab values.  Time coordinating discharge: 35 minutes  SIGNED:  Pamella Pert, MD, PhD 10/27/2022, 9:38 AM

## 2022-10-27 NOTE — TOC Initial Note (Addendum)
Transition of Care [facility]) - Initial/Assessment Note    Patient Details  Name: [name] MRN: [id number] Date of Birth: [birth year]  [facility] CM/SW Contact:    Kimberly Plan, RN Phone Number: 10/27/2022, 10:05 AM  Clinical Narrative:                   Consult for [facility] and DME needs secure chatted MD for PT orders earlier this am    Went to patient's room. Family not present. Called daughter [name]. Patient lives with daughter [name] and her two adult children .   Patient has hospital bed and lift at home.   [name] requesting ambulance transportation home. [name] will be at home today. Explained PTAR will give an estimate time but varies. NCM can ask nurse to call [name] when PTAR arrives to pick her mom up. [name] would like NCM to call her back with the esitmated time and then she will decide if family will come to hospital to sit with her mother.   Patient's PCP is DR [name] who makes house calls   PTAR called estimated time of pick up " within the hour or so". [name] aware and would nurse to call her when PTAR arrives to the unit. Secure chatted nurse   PTAR paperwork on chart  Expected Discharge [name]: Home/Self Care Barriers to Discharge: No Barriers Identified   Patient Goals and CMS Choice            Expected Discharge [name] and Services       Living arrangements for the past 2 months: Single Family Home Expected Discharge Date: 10/27/22               DME Arranged: N/A         HH Arranged: NA          Prior Living Arrangements/Services Living arrangements for the past 2 months: Single Family Home Lives with:: Relatives          Need for Family Participation in Patient Care: Yes (Comment) Care giver support system in place?: Yes (comment) Current home services: DME    Activities of Daily Living Home Assistive Devices/Equipment: Environmental consultant (specify type), Wheelchair ADL Screening (condition at time of admission) Patient's cognitive  ability adequate to safely complete daily activities?: No Is the patient deaf or have difficulty hearing?: No Does the patient have difficulty seeing, even when wearing glasses/contacts?: No Does the patient have difficulty concentrating, remembering, or making decisions?: Yes Patient able to express need for assistance with ADLs?: Yes Does the patient have difficulty dressing or bathing?: Yes Independently performs ADLs?: No Communication: Dependent Is this a change from baseline?: Pre-admission baseline Dressing (OT): Dependent Is this a change from baseline?: Pre-admission baseline Grooming: Dependent Is this a change from baseline?: Pre-admission baseline Feeding: Dependent Is this a change from baseline?: Pre-admission baseline Bathing: Dependent Is this a change from baseline?: Pre-admission baseline Toileting: Dependent Is this a change from baseline?: Pre-admission baseline In/Out Bed: Dependent Is this a change from baseline?: Pre-admission baseline Walks in Home: Dependent Is this a change from baseline?: Pre-admission baseline Does the patient have difficulty walking or climbing stairs?: Yes Weakness of Legs: Both Weakness of Arms/Hands: Both  Permission Sought/Granted                  Emotional Assessment Appearance:: Appears stated age            Admission diagnosis:  Altered mental status, unspecified altered mental status type [  R41.82] AMS (altered mental status) [R41.82] Patient Active Problem List   Diagnosis Date Noted   Acute encephalopathy 10/25/2022   COVID-19 virus infection 10/25/2022   Abdominal distention 10/25/2022   HTN (hypertension) 10/25/2022   HLD (hyperlipidemia) 10/25/2022   Palliative care encounter 07/01/2021   Pressure injury of skin of ankle 07/01/2021   Non-insulin dependent type 2 diabetes mellitus (HCC) 04/23/2021   Acute midline low back pain with left-sided sciatica 01/05/2018   Cervical myelopathy (HCC) 08/15/2016    S/P total knee replacement 07/09/2015   Chronic pain of both knees 11/09/2014   Breast cancer of upper-outer quadrant of left female breast (HCC) 03/02/2013   Bilateral wrist pain 10/29/2012   Generalized osteoarthritis of multiple sites 05/25/2012   Knee osteoarthritis 05/25/2012   PCP:  System, Provider Not In Pharmacy:   [facility] [place], [place] - [address] [place] [address] Phone: [phone] Fax: [phone]  [facility] - [place], [place] - [address] [place] [address] Phone: [phone] Fax: [phone]     Social Determinants of Health (SDOH) Social History: SDOH Screenings   Food Insecurity: No Food Insecurity (10/26/2022)  Housing: Low Risk  (10/26/2022)  Transportation Needs: Unmet Transportation Needs (10/26/2022)  Utilities: Not At Risk (10/26/2022)  Depression (PHQ2-9): Low Risk  (08/04/2019)  Tobacco Use: Medium Risk (10/25/2022)   SDOH Interventions:     Readmission Risk Interventions     No data to display

## 2022-10-27 NOTE — Care Management Obs Status (Signed)
MEDICARE OBSERVATION STATUS NOTIFICATION   Patient Details  Name: [name] MRN: [id number] Date of Birth: 06-25-1931   Medicare Observation Status Notification Given:  Yes   Discussed with daughter [name] via phone  Kingsley Plan, RN 10/27/2022, 10:03 AM

## 2022-10-27 NOTE — Progress Notes (Signed)
Rowe Robert to be D/C'd Home per MD order. Discussed with the patient and all questions fully answered.    IV catheter discontinued intact. Site without signs and symptoms of complications. Dressing and pressure applied.  An After Visit Summary was printed and given to the patient.  Patient escorted via stretcher, and D/C home via PTAR.  Kai Levins  10/27/2022 11:00

## 2023-01-27 ENCOUNTER — Telehealth: Payer: Medicare Other | Admitting: Cardiology

## 2023-03-05 NOTE — Progress Notes (Signed)
Virtual Visit via Video Note The purpose of this virtual visit is to provide medical care in a patient that is unable to be seen in person due to physical or health limitations   Consent was obtained for video visit:  yes  Answered questions that patient had about telehealth interaction:  yes I discussed the limitations, risks, security and privacy concerns of performing an evaluation and management service by telemedicine. I also discussed with the patient that there may be a patient responsible charge related to this service. The patient expressed understanding and agreed to proceed.  Pt location: Home Physician Location: office Name of referring provider:  Maurice Small, MD I connected with [name] at patients initiation/request on 03/06/2023 at  3:00 PM EST by video enabled telemedicine application and verified that I am speaking with the correct person using two identifiers. Pt MRN:  [id number] Pt DOB:  1931/05/22 Video Participants:  [name];  ***    Assessment and Plan:    Kimberly Goldtooth Peekis a 89 y.o. RH female with dextrose right-hand-dominant with a history of hypertension, hyperlipidemia, diabetes, breast cancer, with moderate dementia likely Alzheimer's disease with behavioral disturbance. MRI brain showed diffuse atrophy, mild to moderate chronic microvascular disease  seen today in follow up. memantine 10 mg daily, and for mood trazodone 25 mg nightly, daily and Lexapro 20.  She is on 24/7 care. She has not been seen 09/04/2021.  Her daughter [name] information.  History of Present Illness: ***   Any changes in memory?***Memory  is worse. Some days she talks more than before. She only recognizes the caregiver. She spends rthe day in the bedroom. She is able to eat and drink.  repeats oneself? Endorsed Disoriented when walking into a room?  Leaving objects in unusual places? Ambulates with difficulty?  She is wheelchair-bound, has 24/7 care. Recent falls? Any head  injuries? History of seizures? Wandering behavior? Any mood changes? Agitation ?*** Any history of depression?: Hallucinations? Unknown, she is unable to communicate Paranoia?*** Patient reports that sleeps well without vivid dreams, REM behavior or sleepwalking    vivid dreams *** History of sleep apnea? Any hygiene concerns? Independent of bathing and dressing? Does the patient needs help with medications?  Daughter*** is in charge  Who is in charge of finances?  Daughter is. Any changes in appetite?*** Patient have trouble swallowing? Any headaches? The double vision? Any focal numbness or tingling? Any pain? Unilateral weakness? Any tremors? Any incontinence of urine?  Endorsed, she is at risk for UTI. Any bowel dysfunction?  Constipation     diarrhea   She does not walk anymore, bed bound    History on Initial Assessment 02/24/2019: This is an 89 year old ambidextrous right-hand dominant woman with a history of hypertension, hyperlipidemia, diabetes, breast cancer, presenting for evaluation of memory loss. She states her memory is "pretty well."  She states she usually remembers to take her medications. Her daughter [name] is present during the e-visit and provides a different story. She has been living with her family since 50. Family started noticing memory changes at least 5 years ago, worse in the past year. She would repeat herself, lose things frequently. In the Fall of last year, she would not come home until very late at night, they were now sure if she knew where she was. She started having difficulties managing finances. She had always been early paying her bills, however they found out her credit score went from 800 to [phone] because  she was forgetting to pay. [name] found out in March that bills were 2-3 months behind. For the past 2 years, she has had paranoia, accusing everybody of being a thief. She is convinced she saw her grandson reach over her one night to take money,  which never happened. She states this is true and she asked him what he was there for at 4am last week. She states her grandson is cursing every time he comes in. She has not showered in 2.5 years and has not washed her hair this year. She only takes sponge baths. Her shower stall is full of things, their house looks like a hoarder house. They found out that her license expired in January 2020 but she continued to drive, so they took away her keys. She has not driven since the end of February. She denies getting lost, however [name] reports she called at least once last Fall asking how to get home from their local [employer]. She denies burning food on the stove, [name] reminds her she has not cooked in a few weeks because she had burned something. She is irritated every single day. She states she feels fine until people ask me thing that are "not that." She was started on Donepezil 10mg  daily by her PCP. Several paternal aunts had Alzheimer's disease. Another relative (maternal grandmother's first cousin) had early onset dementia. No history of significant head injuries or alcohol use.    She has left shoulder pain. She has occasional numbness in her feet that resolves when standing. She fell a month ago when she tripped and was on the floor all night. It appears she had incontinence while on the floor, she does not remember this. She denies any headaches, dizziness, diplopia, dysarthria/dysphagia, neck/back pain, focal numbness/tingling/weakness, bowel/bladder dysfunction, anosmia, or tremors.          Current Outpatient Medications on File Prior to Visit  Medication Sig Dispense Refill   benzonatate (TESSALON) 200 MG capsule Take 200 mg by mouth in the morning, at noon, and at bedtime.     Calcium Carbonate-Vitamin D (CALCIUM-D PO) Take 1 tablet by mouth daily.     cetirizine (ZYRTEC) 10 MG tablet Take 10 mg by mouth daily.     Docusate Calcium (STOOL SOFTENER PO) Take 1 tablet by mouth 2 (two) times a  week. Dulcolax     escitalopram (LEXAPRO) 20 MG tablet Take 1 tablet (20 mg total) by mouth at bedtime. 90 tablet 3   Ferrous Sulfate (IRON PO) Take 325 mg by mouth daily.     memantine (NAMENDA) 10 MG tablet Take 1 tablet daily 90 tablet 3   metFORMIN (GLUCOPHAGE-XR) 500 MG 24 hr tablet Take 500 mg by mouth daily.     Multiple Vitamins-Minerals (MULTIVITAMIN WITH MINERALS) tablet Take 1 tablet by mouth daily.     simvastatin (ZOCOR) 40 MG tablet Take 40 mg by mouth daily.     TRAVATAN Z 0.004 % SOLN ophthalmic solution Place 1 drop into both eyes at bedtime.     No current facility-administered medications on file prior to visit.     Observations/Objective:   There were no vitals filed for this visit. GEN:  The patient appears stated age and is in NAD.  Neurological examination: Patient is awake, alert, oriented to person, not to place or date***. No aphasia or dysarthria. Intact fluency, ***decreased  comprehension. Remote and recent memory impaired.Unable to repeat phrases. Cranial nerves: Extraocular movements intact with no nystagmus. No facial asymmetry. Motor: moves  all extremities symmetrically, at least anti-gravity x 4. No incoordination on finger to nose testing***. Gait: narrow-based and steady, able to tandem walk adequately.      Follow Up Instructions:    -I discussed the assessment and treatment plan with the patient. The patient was provided an opportunity to ask questions and all were answered. The patient agreed with the plan and demonstrated an understanding of the instructions.   The patient was advised to call back or seek an in-person evaluation if the symptoms worsen or if the condition fails to improve as anticipated.    Total time spent on today's visit was ***minutes, including both face-to-face time and nonface-to-face time.  Time included that spent on review of records (prior notes available to me/labs/imaging if pertinent), discussing treatment and goals,  answering patient's questions and coordinating care.   Marlowe Kays, PA-C

## 2023-03-06 ENCOUNTER — Telehealth (INDEPENDENT_AMBULATORY_CARE_PROVIDER_SITE_OTHER): Payer: Medicare Other | Admitting: Physician Assistant

## 2023-03-06 ENCOUNTER — Encounter: Payer: Self-pay | Admitting: Physician Assistant

## 2023-03-06 VITALS — Ht 63.0 in | Wt 200.0 lb

## 2023-03-06 DIAGNOSIS — G301 Alzheimer's disease with late onset: Secondary | ICD-10-CM

## 2023-03-06 DIAGNOSIS — F02C0 Dementia in other diseases classified elsewhere, severe, without behavioral disturbance, psychotic disturbance, mood disturbance, and anxiety: Secondary | ICD-10-CM

## 2023-03-08 NOTE — Patient Instructions (Signed)
Good to see you.   Continue 24/7 care with Hospice . Follow-up in 6 months

## 2023-03-15 ENCOUNTER — Other Ambulatory Visit: Payer: Self-pay | Admitting: Neurology

## 2023-03-16 ENCOUNTER — Other Ambulatory Visit: Payer: Self-pay | Admitting: Neurology

## 2023-03-17 ENCOUNTER — Other Ambulatory Visit: Payer: Self-pay | Admitting: Neurology

## 2023-09-08 DEATH — deceased
